# Patient Record
Sex: Male | Born: 1960 | Race: Black or African American | Hispanic: No | Marital: Married | Smoking: Current every day smoker
Health system: Southern US, Community
[De-identification: ages and names within clinical notes are randomized; demographics above are authoritative.]

## PROBLEM LIST (undated history)

## (undated) DIAGNOSIS — Z91148 Patient's other noncompliance with medication regimen for other reason: Secondary | ICD-10-CM

## (undated) DIAGNOSIS — F1910 Other psychoactive substance abuse, uncomplicated: Secondary | ICD-10-CM

## (undated) DIAGNOSIS — R32 Unspecified urinary incontinence: Secondary | ICD-10-CM

## (undated) DIAGNOSIS — Z951 Presence of aortocoronary bypass graft: Secondary | ICD-10-CM

## (undated) DIAGNOSIS — Z9114 Patient's other noncompliance with medication regimen: Secondary | ICD-10-CM

## (undated) DIAGNOSIS — K219 Gastro-esophageal reflux disease without esophagitis: Secondary | ICD-10-CM

## (undated) DIAGNOSIS — T7840XA Allergy, unspecified, initial encounter: Secondary | ICD-10-CM

## (undated) DIAGNOSIS — E785 Hyperlipidemia, unspecified: Secondary | ICD-10-CM

## (undated) DIAGNOSIS — I10 Essential (primary) hypertension: Secondary | ICD-10-CM

## (undated) DIAGNOSIS — G8929 Other chronic pain: Secondary | ICD-10-CM

## (undated) DIAGNOSIS — R55 Syncope and collapse: Secondary | ICD-10-CM

## (undated) DIAGNOSIS — B1920 Unspecified viral hepatitis C without hepatic coma: Secondary | ICD-10-CM

## (undated) DIAGNOSIS — E119 Type 2 diabetes mellitus without complications: Secondary | ICD-10-CM

## (undated) DIAGNOSIS — I77810 Thoracic aortic ectasia: Secondary | ICD-10-CM

## (undated) DIAGNOSIS — I5189 Other ill-defined heart diseases: Secondary | ICD-10-CM

## (undated) DIAGNOSIS — IMO0002 Reserved for concepts with insufficient information to code with codable children: Secondary | ICD-10-CM

## (undated) DIAGNOSIS — I2510 Atherosclerotic heart disease of native coronary artery without angina pectoris: Secondary | ICD-10-CM

## (undated) DIAGNOSIS — J449 Chronic obstructive pulmonary disease, unspecified: Secondary | ICD-10-CM

## (undated) DIAGNOSIS — M549 Dorsalgia, unspecified: Secondary | ICD-10-CM

## (undated) DIAGNOSIS — J45909 Unspecified asthma, uncomplicated: Secondary | ICD-10-CM

## (undated) HISTORY — DX: Gastro-esophageal reflux disease without esophagitis: K21.9

## (undated) HISTORY — DX: Other chronic pain: G89.29

## (undated) HISTORY — DX: Other ill-defined heart diseases: I51.89

## (undated) HISTORY — DX: Patient's other noncompliance with medication regimen for other reason: Z91.148

## (undated) HISTORY — DX: Unspecified asthma, uncomplicated: J45.909

## (undated) HISTORY — DX: Syncope and collapse: R55

## (undated) HISTORY — DX: Unspecified viral hepatitis C without hepatic coma: B19.20

## (undated) HISTORY — DX: Patient's other noncompliance with medication regimen: Z91.14

## (undated) HISTORY — DX: Dorsalgia, unspecified: M54.9

## (undated) HISTORY — DX: Reserved for concepts with insufficient information to code with codable children: IMO0002

## (undated) HISTORY — PX: OTHER SURGICAL HISTORY: SHX169

## (undated) HISTORY — DX: Unspecified urinary incontinence: R32

## (undated) HISTORY — DX: Chronic obstructive pulmonary disease, unspecified: J44.9

## (undated) HISTORY — DX: Essential (primary) hypertension: I10

## (undated) HISTORY — DX: Allergy, unspecified, initial encounter: T78.40XA

## (undated) HISTORY — DX: Thoracic aortic ectasia: I77.810

---

## 1979-04-30 DIAGNOSIS — G8929 Other chronic pain: Secondary | ICD-10-CM

## 1979-04-30 HISTORY — DX: Other chronic pain: G89.29

## 2004-07-09 ENCOUNTER — Emergency Department: Payer: Self-pay | Admitting: General Practice

## 2004-08-02 ENCOUNTER — Emergency Department: Payer: Self-pay | Admitting: Internal Medicine

## 2004-08-27 ENCOUNTER — Ambulatory Visit: Payer: Self-pay | Admitting: Unknown Physician Specialty

## 2004-09-17 ENCOUNTER — Emergency Department: Payer: Self-pay | Admitting: Emergency Medicine

## 2004-09-17 ENCOUNTER — Other Ambulatory Visit: Payer: Self-pay

## 2004-09-24 ENCOUNTER — Emergency Department: Payer: Self-pay | Admitting: Emergency Medicine

## 2004-10-25 ENCOUNTER — Ambulatory Visit: Payer: Self-pay | Admitting: Unknown Physician Specialty

## 2004-12-06 ENCOUNTER — Emergency Department: Payer: Self-pay | Admitting: Emergency Medicine

## 2005-01-08 ENCOUNTER — Emergency Department: Payer: Self-pay | Admitting: Emergency Medicine

## 2005-01-08 ENCOUNTER — Other Ambulatory Visit: Payer: Self-pay

## 2005-01-09 ENCOUNTER — Ambulatory Visit: Payer: Self-pay | Admitting: Emergency Medicine

## 2005-01-15 ENCOUNTER — Emergency Department: Payer: Self-pay | Admitting: Unknown Physician Specialty

## 2005-01-26 ENCOUNTER — Emergency Department: Payer: Self-pay | Admitting: Emergency Medicine

## 2007-10-08 ENCOUNTER — Emergency Department: Payer: Self-pay | Admitting: Emergency Medicine

## 2007-10-28 ENCOUNTER — Emergency Department: Payer: Self-pay | Admitting: Unknown Physician Specialty

## 2007-11-28 ENCOUNTER — Emergency Department: Payer: Self-pay | Admitting: Emergency Medicine

## 2007-11-28 ENCOUNTER — Other Ambulatory Visit: Payer: Self-pay

## 2008-01-14 ENCOUNTER — Emergency Department: Payer: Self-pay | Admitting: Emergency Medicine

## 2008-10-10 ENCOUNTER — Emergency Department: Payer: Self-pay | Admitting: Emergency Medicine

## 2008-11-01 ENCOUNTER — Emergency Department: Payer: Self-pay | Admitting: Emergency Medicine

## 2008-12-21 ENCOUNTER — Emergency Department: Payer: Self-pay | Admitting: Emergency Medicine

## 2012-11-27 HISTORY — PX: CARDIAC CATHETERIZATION: SHX172

## 2012-11-27 HISTORY — PX: CORONARY ARTERY BYPASS GRAFT: SHX141

## 2013-01-23 ENCOUNTER — Observation Stay: Payer: Self-pay | Admitting: Internal Medicine

## 2013-01-23 LAB — BASIC METABOLIC PANEL
Anion Gap: 8 (ref 7–16)
Calcium, Total: 9.1 mg/dL (ref 8.5–10.1)
Co2: 24 mmol/L (ref 21–32)
EGFR (Non-African Amer.): >60
Glucose: 527 mg/dL (ref 65–99)
Osmolality: 286 (ref 275–301)
Sodium: 131 mmol/L — ABNORMAL LOW (ref 136–145)

## 2013-01-23 LAB — CBC
HCT: 46.7 % (ref 40.0–52.0)
MCHC: 32.5 g/dL (ref 32.0–36.0)
RBC: 5.67 10*6/uL (ref 4.40–5.90)
RDW: 14.2 % (ref 11.5–14.5)
WBC: 7.5 10*3/uL (ref 3.8–10.6)

## 2013-01-23 LAB — TROPONIN I
Troponin-I: <0.02 ng/mL
Troponin-I: <0.02 ng/mL

## 2013-01-23 LAB — CK TOTAL AND CKMB (NOT AT ARMC)
CK, Total: 56 U/L (ref 35–232)
CK-MB: <0.5 ng/mL — ABNORMAL LOW (ref 0.5–3.6)

## 2013-01-24 LAB — LIPID PANEL
Ldl Cholesterol, Calc: 70 mg/dL (ref 0–100)
Triglycerides: 184 mg/dL (ref 0–200)
VLDL Cholesterol, Calc: 37 mg/dL (ref 5–40)

## 2013-01-24 LAB — BASIC METABOLIC PANEL
Anion Gap: 6 — ABNORMAL LOW (ref 7–16)
BUN: 11 mg/dL (ref 7–18)
Calcium, Total: 8.9 mg/dL (ref 8.5–10.1)
Chloride: 104 mmol/L (ref 98–107)
EGFR (African American): >60
EGFR (Non-African Amer.): >60
Osmolality: 274 (ref 275–301)
Potassium: 3.5 mmol/L (ref 3.5–5.1)

## 2013-02-10 ENCOUNTER — Emergency Department: Payer: Self-pay

## 2013-02-10 LAB — URINALYSIS, COMPLETE
Glucose,UR: >=500 mg/dL (ref 0–75)
Ketone: NEGATIVE
Leukocyte Esterase: NEGATIVE
Nitrite: NEGATIVE
RBC,UR: NONE SEEN /HPF (ref 0–5)
Specific Gravity: 1.031 (ref 1.003–1.030)
Squamous Epithelial: NONE SEEN
WBC UR: 1 /HPF (ref 0–5)

## 2013-02-10 LAB — CBC
HCT: 43.6 % (ref 40.0–52.0)
HGB: 14.3 g/dL (ref 13.0–18.0)
MCH: 26.5 pg (ref 26.0–34.0)
MCHC: 32.8 g/dL (ref 32.0–36.0)
MCV: 81 fL (ref 80–100)
Platelet: 109 10*3/uL — ABNORMAL LOW (ref 150–440)
RBC: 5.39 10*6/uL (ref 4.40–5.90)
RDW: 14.3 % (ref 11.5–14.5)
WBC: 7.8 10*3/uL (ref 3.8–10.6)

## 2013-02-10 LAB — COMPREHENSIVE METABOLIC PANEL
Albumin: 3.8 g/dL (ref 3.4–5.0)
Alkaline Phosphatase: 110 U/L (ref 50–136)
Bilirubin,Total: 0.5 mg/dL (ref 0.2–1.0)
Calcium, Total: 9.3 mg/dL (ref 8.5–10.1)
Creatinine: 0.74 mg/dL (ref 0.60–1.30)
EGFR (African American): >60
Glucose: 395 mg/dL — ABNORMAL HIGH (ref 65–99)
Osmolality: 280 (ref 275–301)
SGOT(AST): 38 U/L — ABNORMAL HIGH (ref 15–37)
SGPT (ALT): 61 U/L (ref 12–78)
Sodium: 132 mmol/L — ABNORMAL LOW (ref 136–145)
Total Protein: 7.5 g/dL (ref 6.4–8.2)

## 2013-02-24 ENCOUNTER — Ambulatory Visit (INDEPENDENT_AMBULATORY_CARE_PROVIDER_SITE_OTHER): Payer: Medicare Other | Admitting: Adult Health

## 2013-02-24 ENCOUNTER — Encounter: Payer: Self-pay | Admitting: Adult Health

## 2013-02-24 VITALS — BP 98/60 | HR 89 | Temp 97.9°F | Resp 12 | Ht 67.5 in | Wt 171.5 lb

## 2013-02-24 DIAGNOSIS — I25810 Atherosclerosis of coronary artery bypass graft(s) without angina pectoris: Secondary | ICD-10-CM

## 2013-02-24 DIAGNOSIS — E1165 Type 2 diabetes mellitus with hyperglycemia: Secondary | ICD-10-CM

## 2013-02-24 DIAGNOSIS — B1920 Unspecified viral hepatitis C without hepatic coma: Secondary | ICD-10-CM | POA: Insufficient documentation

## 2013-02-24 DIAGNOSIS — Z0000 Encounter for general adult medical examination without abnormal findings: Secondary | ICD-10-CM | POA: Insufficient documentation

## 2013-02-24 NOTE — Assessment & Plan Note (Addendum)
Start levemir 10 units at bedtime. Provided patient with sample. Instructions and education regarding use of the flexpen. He needs to monitor his BG 4 times a day. Provided with diabetes journal so that he could bring readings with him next week. He is aware we may need to increase dose of insulin and possibly add 3rd agent. Check bmet and hgba1c. Note, greater than 45 min were spent in face to face communication pertaining to the problems listed today and in the education, planning and implementation of care as well as in the review of outside medical records.

## 2013-02-24 NOTE — Patient Instructions (Addendum)
  Please have your labs drawn prior to leaving the office.  We will contact you once the results are available.   Start Levemir 10 units at bedtime.  Please check your blood sugar 4 times a day (before breakfast, before lunch, before dinner and before bedtime)  Bring the blood glucose readings with you on your next visit so that I may adjust your insulin dose as necessary.  Please schedule a follow up appointment to see me next Wednesday.   Appointment with Cardiology on Tuesday, November 4th at 3:30 pm. Please arrive at 3:15 pm  [address] [place], [place]  [phone]  I am also referring you to GI for your hepatitis C. They will contact you with an appointment.

## 2013-02-24 NOTE — Progress Notes (Signed)
Subjective:    Patient ID: Kyle [name], male    DOB: Jun 19, 1960, 52 y.o.   MRN: [id number]  HPI  Patient is a pleasant 52 y/o male who presents to clinic to establish care. He has a history of hypertension, hyperlipidemia, diabetes type 2,  CAD s/p CABG x 4 (11/25/12) at the [facility]. 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 This was not addressed during surgery.  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 Pt does not have a local cardiologist. He was participating in cardiac rehab at [facility] but his blood glucose levels have been uncontrolled. They will not let him participate in rehab until this is better controlled. His original appointment with me was for December. A nurse from the rehab department called to see if patient could be worked in sooner. Patient reports that his blood glucose level has been running greater than 300. He is on metformin 1000 mg twice daily with meals. He reports that his blood glucose levels were well controlled prior to surgery. Pt c/o polyuria, polydipsia.     Past Medical History  Diagnosis Date  . Asthma   . Diabetes mellitus without complication   . COPD (chronic obstructive pulmonary disease)   . Fainting spell   . GERD (gastroesophageal reflux disease)   . Allergy   . Hyperlipidemia   . Hypertension   . Urine incontinence   . CAD (coronary artery disease)   . Hepatitis C     2/2 tatoo     Past Surgical History  Procedure Laterality Date  . Coronary artery bypass graft  8/14     Family History  Problem Relation Age of Onset  . Heart disease Mother   . Heart disease Father   . Hypertension Father   . Diabetes Sister   . Cancer Sister     skin cancer     History   Social History  . Marital Status: Single    Spouse Name: N/A    Number of Children: N/A  . Years of Education: 14   Occupational History   . 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 Smoking status: Former Smoker -- 35 years    Types: Cigarettes    Quit date: 11/24/2012  . Smokeless tobacco: Never Used  . Alcohol Use: No  . Drug Use: No  . Sexual Activity: Not on file   Other Topics Concern  . Not on file   Social History Narrative   [name] grew up in the [place] area. He was in the [employer] for 4 years. 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 He is currently disabled. [name] lives by himself but has excellent support system. He is very close to his family. He is currently taking online courses through [employer]. He is trying to obtain a degree in social services and ministry. His goal is to work with at-risk young adults.      Review of Systems  Constitutional: Positive for fatigue. Negative for activity change and appetite change.  HENT: Negative.   Respiratory: Negative for cough, chest tightness and shortness of breath.   Cardiovascular: Negative.   Gastrointestinal: Negative.   Endocrine: Positive for polydipsia and polyuria.  Genitourinary: Negative.   Musculoskeletal: Positive  for back pain and neck pain.  Skin: Negative.   Allergic/Immunologic: Negative.   Neurological: Negative.   Hematological: Negative.   Psychiatric/Behavioral: Negative.   All other systems reviewed and are negative.       Objective:   Physical Exam  Constitutional: He is oriented to person, place, and time. He appears well-developed and well-nourished. No distress.  HENT:  Head: Normocephalic and atraumatic.  Eyes: Conjunctivae are normal. Pupils are equal, round, and reactive to light.  Cardiovascular: Normal rate, regular rhythm and intact distal pulses.  Exam reveals no gallop.   No murmur heard. Pulmonary/Chest: Effort normal and breath sounds normal. No respiratory distress. He has no wheezes. He has no  rales.  Abdominal: Soft. Bowel sounds are normal.  Musculoskeletal: Normal range of motion.  Neurological: He is alert and oriented to person, place, and time.  Skin: Skin is warm and dry.  Psychiatric: He has a normal mood and affect. His behavior is normal. Judgment and thought content normal.    BP 98/60  Pulse 89  Temp(Src) 97.9 F (36.6 C) (Oral)  Resp 12  Ht 5' 7.5" (1.715 m)  Wt 171 lb 8 oz (77.792 kg)  BMI 26.45 kg/m2  SpO2 97%       Assessment & Plan:

## 2013-02-25 ENCOUNTER — Telehealth: Payer: Self-pay | Admitting: *Deleted

## 2013-02-25 ENCOUNTER — Encounter: Payer: Self-pay | Admitting: Emergency Medicine

## 2013-02-25 LAB — HEPATIC FUNCTION PANEL
ALT: 66 U/L — ABNORMAL HIGH (ref 0–53)
AST: 42 U/L — ABNORMAL HIGH (ref 0–37)
Albumin: 4.1 g/dL (ref 3.5–5.2)
Alkaline Phosphatase: 92 U/L (ref 39–117)
Bilirubin, Direct: 0.2 mg/dL (ref 0.0–0.3)
Total Bilirubin: 0.5 mg/dL (ref 0.3–1.2)
Total Protein: 7.2 g/dL (ref 6.0–8.3)

## 2013-02-25 LAB — BASIC METABOLIC PANEL
BUN: 12 mg/dL (ref 6–23)
Calcium: 9.6 mg/dL (ref 8.4–10.5)
Creatinine, Ser: 0.8 mg/dL (ref 0.4–1.5)
GFR: 129.46 mL/min (ref >60.00)
Glucose, Bld: 518 mg/dL (ref 70–99)
Potassium: 4.9 mEq/L (ref 3.5–5.1)

## 2013-02-25 LAB — TSH: TSH: 1.34 u[IU]/mL (ref 0.35–5.50)

## 2013-02-25 LAB — CBC WITH DIFFERENTIAL/PLATELET
Basophils Absolute: 0.1 10*3/uL (ref 0.0–0.1)
Basophils Relative: 0.7 % (ref 0.0–3.0)
Eosinophils Absolute: 0.3 10*3/uL (ref 0.0–0.7)
Eosinophils Relative: 2.9 % (ref 0.0–5.0)
HCT: 45 % (ref 39.0–52.0)
Hemoglobin: 14.7 g/dL (ref 13.0–17.0)
Lymphocytes Relative: 44.2 % (ref 12.0–46.0)
Lymphs Abs: 4.1 10*3/uL — ABNORMAL HIGH (ref 0.7–4.0)
MCHC: 32.7 g/dL (ref 30.0–36.0)
MCV: 80.6 fl (ref 78.0–100.0)
Monocytes Absolute: 0.7 10*3/uL (ref 0.1–1.0)
Monocytes Relative: 7.6 % (ref 3.0–12.0)
Neutro Abs: 4.1 10*3/uL (ref 1.4–7.7)
Neutrophils Relative %: 44.6 % (ref 43.0–77.0)
Platelets: 109 10*3/uL — ABNORMAL LOW (ref 150.0–400.0)
RBC: 5.59 Mil/uL (ref 4.22–5.81)
RDW: 13.9 % (ref 11.5–14.6)
WBC: 9.2 10*3/uL (ref 4.5–10.5)

## 2013-02-25 LAB — HEMOGLOBIN A1C: Hgb A1c MFr Bld: 14 % — ABNORMAL HIGH (ref 4.6–6.5)

## 2013-02-25 LAB — VITAMIN D 25 HYDROXY (VIT D DEFICIENCY, FRACTURES): Vit D, 25-Hydroxy: 29 ng/mL — ABNORMAL LOW (ref 30–89)

## 2013-02-25 LAB — VITAMIN B12: Vitamin B-12: 385 pg/mL (ref 211–911)

## 2013-02-25 NOTE — Telephone Encounter (Signed)
[facility] called with critical glucose 518, Raquel notified.

## 2013-02-26 ENCOUNTER — Telehealth: Payer: Self-pay | Admitting: Adult Health

## 2013-02-26 NOTE — Telephone Encounter (Signed)
Pt notified, verbalized understanding. Will call back next with readings.

## 2013-02-26 NOTE — Telephone Encounter (Signed)
Start levemir 10 units in the morning and 10 units in the evening

## 2013-02-26 NOTE — Telephone Encounter (Signed)
Spoke with pt, has been using Levemir 10 units at bedtime. Sugars continue to remain high, 477 this am, 442 before lunch.

## 2013-02-26 NOTE — Telephone Encounter (Signed)
States he spoke with cardiac nurse and she advised him to call and tell Raquel that the insulin prescribed is not enough.

## 2013-02-26 NOTE — Telephone Encounter (Signed)
Left message for pt to return my call.

## 2013-02-27 ENCOUNTER — Encounter: Payer: Self-pay | Admitting: Adult Health

## 2013-02-27 DIAGNOSIS — I25810 Atherosclerosis of coronary artery bypass graft(s) without angina pectoris: Secondary | ICD-10-CM | POA: Insufficient documentation

## 2013-02-27 NOTE — Assessment & Plan Note (Addendum)
Patient has not been followed by GI for his dx of Hep C. Check liver panel. Will refer to GI.

## 2013-02-27 NOTE — Assessment & Plan Note (Signed)
Normal physical exam. Check labs: cbc w/diff, bmet, tsh, hepatic panel, vit d, b12, hgbA1c. Refer to cardiology - pt had surgery in [place]. No local MD Refer to GI - hx of hep c

## 2013-02-27 NOTE — Assessment & Plan Note (Signed)
Pt is s/p CABG x 4 on 11/25/12 in [place]. He is doing well; however, he has not been able to participate in cardiac rehab 2/2 hyperglycemia. Starting levemir. Will refer to cardiology to establish care with local MD.

## 2013-03-01 ENCOUNTER — Telehealth: Payer: Self-pay | Admitting: *Deleted

## 2013-03-01 NOTE — Telephone Encounter (Signed)
Left message, advising pt, asked if he could be seen tomorrow at 11:15 per Raquel to discuss. I made pt appointment and requested him to call back to confirm if he could keep this appointment.

## 2013-03-01 NOTE — Telephone Encounter (Signed)
Pt left VM, stating glucose level 491 fasting today. Increased Levemir to bid last Friday. Has appointment with Raquel on 03/03/13.

## 2013-03-01 NOTE — Telephone Encounter (Signed)
I need to start him on a sliding scale insulin in addition to what he is already on. Try to see if we can get him in tomorrow - 30 minute slot.

## 2013-03-02 ENCOUNTER — Ambulatory Visit: Payer: Medicare Other | Admitting: Adult Health

## 2013-03-02 ENCOUNTER — Ambulatory Visit (INDEPENDENT_AMBULATORY_CARE_PROVIDER_SITE_OTHER): Payer: Medicare Other | Admitting: Cardiovascular Disease

## 2013-03-02 ENCOUNTER — Encounter: Payer: Self-pay | Admitting: Cardiovascular Disease

## 2013-03-02 VITALS — BP 100/78 | HR 72 | Ht 67.0 in | Wt 175.2 lb

## 2013-03-02 DIAGNOSIS — Z87891 Personal history of nicotine dependence: Secondary | ICD-10-CM | POA: Insufficient documentation

## 2013-03-02 DIAGNOSIS — B1920 Unspecified viral hepatitis C without hepatic coma: Secondary | ICD-10-CM

## 2013-03-02 DIAGNOSIS — E785 Hyperlipidemia, unspecified: Secondary | ICD-10-CM

## 2013-03-02 DIAGNOSIS — E1165 Type 2 diabetes mellitus with hyperglycemia: Secondary | ICD-10-CM

## 2013-03-02 DIAGNOSIS — R0602 Shortness of breath: Secondary | ICD-10-CM

## 2013-03-02 DIAGNOSIS — R079 Chest pain, unspecified: Secondary | ICD-10-CM

## 2013-03-02 DIAGNOSIS — I25810 Atherosclerosis of coronary artery bypass graft(s) without angina pectoris: Secondary | ICD-10-CM

## 2013-03-02 NOTE — Assessment & Plan Note (Signed)
Mildly elevated LFTs. We'll need to monitor this periodically while he is on his statin.

## 2013-03-02 NOTE — Assessment & Plan Note (Signed)
Long history of smoking, stopped 4 months ago around the time of his surgery. Recommended he continue smoking cessation

## 2013-03-02 NOTE — Progress Notes (Signed)
Patient ID: Kyle [name], male    DOB: 04/17/1961, 52 y.o.   MRN: [id number]  HPI Comments: Kyle [name] is a 52 year old gentleman with long history of poorly controlled diabetes, long history of smoking who stopped 4 months ago, hyperlipidemia, presented by referral for evaluation of recent bypass surgery.   He reports that he had worsening shortness of breath, fatigue, slight chest pain prior to his recent bypass surgery. He had four-vessel bypass in July 2014 at the [facility] in [place]. Hospital is called [facility]. Records have been requested.   Reports that since then, he has been having more difficulty with his sugars. He is currently taking Levemir 10 units twice a day. Reports that sugars continue to run off the scale. He is trying to watch what he eats. He has followup with primary care tomorrow. He is running out of his needles. Also reports running out of his pain medication which is provided by the [facility]. He is to call them once a month to get his medication through the mail.  In followup today, he denies any significant chest pain or shortness of breath. He was that he has recovered relatively well from the surgery. Still some fatigue.  EKG shows normal sinus rhythm with rate 75 beats per minute, right bundle branch block, left anterior fascicular block   Outpatient Encounter Prescriptions as of 03/02/2013  Medication Sig  . albuterol (PROVENTIL HFA;VENTOLIN HFA) 108 (90 BASE) MCG/ACT inhaler Inhale 2 puffs into the lungs 4 (four) times daily.  Marland Kitchen ammonium lactate (AMLACTIN) 12 % cream Apply topically 2 (two) times daily.  Marland Kitchen aspirin 325 MG tablet Take 325 mg by mouth daily.  . cetirizine (ZYRTEC) 10 MG tablet Take 10 mg by mouth daily as needed for allergies.  Marland Kitchen docusate sodium (COLACE) 100 MG capsule Take 100 mg by mouth 2 (two) times daily as needed for constipation.  . gabapentin (NEURONTIN) 800 MG tablet Take 1,200 mg by mouth 3 (three) times daily.  Marland Kitchen  glucose blood test strip 1 each by Other route as needed for other. Precision xtra test strip  . hydrOXYzine (ATARAX/VISTARIL) 25 MG tablet Take 25 mg by mouth every 8 (eight) hours as needed for itching.  Marland Kitchen LANCETS ULTRA FINE MISC by Does not apply route 4 (four) times daily.  . metFORMIN (GLUCOPHAGE) 1000 MG tablet Take 1,000 mg by mouth 2 (two) times daily with a meal.  . metoprolol tartrate (LOPRESSOR) 25 MG tablet Take 37.5 mg by mouth 3 (three) times daily.  Marland Kitchen omeprazole (PRILOSEC) 20 MG capsule Take 20 mg by mouth daily.  . sildenafil (VIAGRA) 100 MG tablet Take 100 mg by mouth daily as needed for erectile dysfunction.  . simvastatin (ZOCOR) 80 MG tablet Take 80 mg by mouth at bedtime.  . Zinc Oxide 10 % OINT Apply topically 4 (four) times daily as needed. A & D ointment     Review of Systems  Constitutional: Negative.   HENT: Negative.   Eyes: Negative.   Respiratory: Negative.   Cardiovascular: Negative.   Gastrointestinal: Negative.   Endocrine: Negative.   Musculoskeletal: Positive for back pain and neck pain.  Skin: Negative.   Allergic/Immunologic: Negative.   Neurological: Negative.   Hematological: Negative.   Psychiatric/Behavioral: Negative.   All other systems reviewed and are negative.    BP 100/78  Pulse 72  Ht 5\' 7"  (1.702 m)  Wt 175 lb 4 oz (79.493 kg)  BMI 27.44 kg/m2  Physical Exam  Nursing note and vitals reviewed. Constitutional: He is oriented to person, place, and time. He appears well-developed and well-nourished.  HENT:  Head: Normocephalic.  Nose: Nose normal.  Mouth/Throat: Oropharynx is clear and moist.  Eyes: Conjunctivae are normal. Pupils are equal, round, and reactive to light.  Neck: Normal range of motion. Neck supple. No JVD present.  Cardiovascular: Normal rate, regular rhythm, S1 normal, S2 normal, normal heart sounds and intact distal pulses.  Exam reveals no gallop and no friction rub.   No murmur heard. Pulmonary/Chest:  Effort normal and breath sounds normal. No respiratory distress. He has no wheezes. He has no rales. He exhibits no tenderness.  Abdominal: Soft. Bowel sounds are normal. He exhibits no distension. There is no tenderness.  Musculoskeletal: Normal range of motion. He exhibits no edema and no tenderness.  Lymphadenopathy:    He has no cervical adenopathy.  Neurological: He is alert and oriented to person, place, and time. Coordination normal.  Skin: Skin is warm and dry. No rash noted. No erythema.  Psychiatric: He has a normal mood and affect. His behavior is normal. Judgment and thought content normal.      Assessment and Plan

## 2013-03-02 NOTE — Assessment & Plan Note (Signed)
Recent hemoglobin A1c of 14. Stressed importance of strict diet compliance. He is taking insulin 10 units twice a day. Suggested he increase it to 20 units tonight and in the morning. He has followup with primary care tomorrow.

## 2013-03-02 NOTE — Assessment & Plan Note (Signed)
Appears to be doing well following his bypass surgery in July 2014. He has stopped smoking, able to tolerate his cholesterol medication. Stressed the importance of good diabetes control.

## 2013-03-02 NOTE — Patient Instructions (Signed)
You are doing well. No medication changes were made.  Please call us if you have new issues that need to be addressed before your next appt.  Your physician wants you to follow-up in: 6 months.  You will receive a reminder letter in the mail two months in advance. If you don't receive a letter, please call our office to schedule the follow-up appointment.   

## 2013-03-02 NOTE — Assessment & Plan Note (Signed)
Recommended he stay on his simvastatin. Goal LDL less than 70. Numbers may improve as his diabetes improves

## 2013-03-03 ENCOUNTER — Encounter: Payer: Self-pay | Admitting: Adult Health

## 2013-03-03 ENCOUNTER — Ambulatory Visit (INDEPENDENT_AMBULATORY_CARE_PROVIDER_SITE_OTHER): Payer: Medicare Other | Admitting: Adult Health

## 2013-03-03 VITALS — BP 116/74 | HR 92 | Temp 98.3°F | Resp 16 | Ht 67.0 in | Wt 169.8 lb

## 2013-03-03 DIAGNOSIS — E1165 Type 2 diabetes mellitus with hyperglycemia: Secondary | ICD-10-CM

## 2013-03-03 MED ORDER — INSULIN NPH ISOPHANE & REGULAR (70-30) 100 UNIT/ML ~~LOC~~ SUSP: SUBCUTANEOUS | Status: DC

## 2013-03-03 MED ORDER — INSULIN SYRINGES (DISPOSABLE) U-100 1 ML MISC: Status: DC

## 2013-03-03 NOTE — Progress Notes (Signed)
Pre-visit discussion using our clinic review tool. No additional management support is needed unless otherwise documented below in the visit note.  

## 2013-03-03 NOTE — Progress Notes (Signed)
Subjective:    Patient ID: Kyle [name], male    DOB: 02/01/1961, 52 y.o.   MRN: [id number]  HPI  Patient is a pleasant 52 year old male who presents to clinic for one week followup for diabetes type 2 uncontrolled. Patient was insulin nave so he was initially started on Levemir 10 units at bedtime. His sugars were still not well controlled so Levemir 10 units in the morning was also added. Patient was asked to monitor his blood sugar levels 4 times a day-before meals and at bedtime for one week so that we could make adjustments to his medication. Patient called several times during the week stating that his blood sugar was still running high. He presents today with his glucometer and readings since last week. He has not been checking these 4 times a day. He has mostly been checking his blood sugar twice a day. Blood sugar have been running from 392 to high 400s. Still reports increased urination and thirst.  Current Outpatient Prescriptions on File Prior to Visit  Medication Sig Dispense Refill  . albuterol (PROVENTIL HFA;VENTOLIN HFA) 108 (90 BASE) MCG/ACT inhaler Inhale 2 puffs into the lungs 4 (four) times daily.      Marland Kitchen ammonium lactate (AMLACTIN) 12 % cream Apply topically 2 (two) times daily.      Marland Kitchen aspirin 325 MG tablet Take 325 mg by mouth daily.      . cetirizine (ZYRTEC) 10 MG tablet Take 10 mg by mouth daily as needed for allergies.      Marland Kitchen docusate sodium (COLACE) 100 MG capsule Take 100 mg by mouth 2 (two) times daily as needed for constipation.      . gabapentin (NEURONTIN) 800 MG tablet Take 1,200 mg by mouth 3 (three) times daily.      Marland Kitchen glucose blood test strip 1 each by Other route as needed for other. Precision xtra test strip      . hydrOXYzine (ATARAX/VISTARIL) 25 MG tablet Take 25 mg by mouth every 8 (eight) hours as needed for itching.      Marland Kitchen LANCETS ULTRA FINE MISC by Does not apply route 4 (four) times daily.      . metFORMIN (GLUCOPHAGE) 1000 MG tablet Take 1,000 mg by  mouth 2 (two) times daily with a meal.      . metoprolol tartrate (LOPRESSOR) 25 MG tablet Take 37.5 mg by mouth 3 (three) times daily.      Marland Kitchen omeprazole (PRILOSEC) 20 MG capsule Take 20 mg by mouth daily.      . sildenafil (VIAGRA) 100 MG tablet Take 100 mg by mouth daily as needed for erectile dysfunction.      . simvastatin (ZOCOR) 80 MG tablet Take 80 mg by mouth at bedtime.      . Zinc Oxide 10 % OINT Apply topically 4 (four) times daily as needed. A & D ointment       No current facility-administered medications on file prior to visit.    Review of Systems  Constitutional: Negative.   Respiratory: Positive for shortness of breath.        Shortness of breath with exertion  Cardiovascular: Negative for chest pain, palpitations and leg swelling.  Endocrine: Positive for polydipsia and polyuria.  Neurological: Negative for tremors and weakness.       Objective:   Physical Exam  Constitutional: He is oriented to person, place, and time. He appears well-developed and well-nourished. No distress.  Cardiovascular: Normal rate and regular rhythm.   Pulmonary/Chest: Effort  normal. No respiratory distress.  Neurological: He is alert and oriented to person, place, and time.  Psychiatric: He has a normal mood and affect. His behavior is normal. Judgment and thought content normal.    BP 116/74  Pulse 92  Temp(Src) 98.3 F (36.8 C) (Oral)  Resp 16  Ht 5\' 7"  (1.702 m)  Wt 169 lb 12 oz (76.998 kg)  BMI 26.58 kg/m2  SpO2 97%       Assessment & Plan:

## 2013-03-03 NOTE — Assessment & Plan Note (Addendum)
Discontinue Levemir (patient had been provided with samples). Start insulin 70/30 - 25 units before breakfast and 15 units before dinner. Goal is to have AM fasting blood sugar less than 150. Adjust p.m. dose every 3 days (3 units) as needed. Patient will check blood sugars 4 times a day x2 weeks. He will send his blood sugar readings through my chart. Note, 30 minutes spent in face-to-face communication with patient in reviewing patient's information, education pertaining to changes in medication, assessment, planning and implementation of care pertaining to his uncontrolled diabetes.

## 2013-03-03 NOTE — Patient Instructions (Signed)
  Please check your blood glucose before each meal and at bedtime for the next 2 weeks.  Records your readings and send them to me through your MyChart Account so that we can make adjustments to your medication.  Hold the Levemir - Do not take.  Start Insulin 70/30. Inject 25 units before breakfast and then 15 units before your evening meal.  I have sent in a prescription for the syringes.

## 2013-03-04 ENCOUNTER — Other Ambulatory Visit: Payer: Self-pay

## 2013-03-05 ENCOUNTER — Telehealth: Payer: Self-pay | Admitting: *Deleted

## 2013-03-05 NOTE — Telephone Encounter (Signed)
Pt called to report glucose readings. Thursday am 500. Before dinner 360. Bedtime 276. AM today 352. Pt stopped levemir and began 70/30 as directed, confirmed units with pt. Also requests BW to be sent to [facility] fax [phone]. Fax sent.

## 2013-03-08 ENCOUNTER — Other Ambulatory Visit: Payer: Self-pay | Admitting: Adult Health

## 2013-03-08 DIAGNOSIS — E1165 Type 2 diabetes mellitus with hyperglycemia: Secondary | ICD-10-CM

## 2013-03-08 NOTE — Telephone Encounter (Signed)
Pt called with readings, Saturday: 122, 216, 316, and 270. Sunday 312. Pt agreeable to referral.

## 2013-03-08 NOTE — Telephone Encounter (Signed)
Uncertain why he is not responding to any insulin. I am referring him to endocrine.

## 2013-03-10 ENCOUNTER — Ambulatory Visit (INDEPENDENT_AMBULATORY_CARE_PROVIDER_SITE_OTHER): Payer: Medicare Other | Admitting: Internal Medicine

## 2013-03-10 ENCOUNTER — Encounter: Payer: Self-pay | Admitting: Internal Medicine

## 2013-03-10 VITALS — BP 124/88 | HR 108 | Temp 97.8°F | Resp 10 | Ht 68.0 in | Wt 175.8 lb

## 2013-03-10 DIAGNOSIS — Z23 Encounter for immunization: Secondary | ICD-10-CM

## 2013-03-10 DIAGNOSIS — E1165 Type 2 diabetes mellitus with hyperglycemia: Secondary | ICD-10-CM

## 2013-03-10 NOTE — Progress Notes (Signed)
Patient ID: Kyle [name], male   DOB: 18-Aug-1960, 52 y.o.   MRN: [id number]  HPI: Kyle [name] is a 52 y.o.-year-old male, referred by his PCP, Rey,Raquel, NP, for management of DM2, insulin-dependent, uncontrolled, with complications (CAD, s/p CABG; ED; PN).  Patient has been diagnosed with diabetes in 2012; he started insulin in: 2 weeks ago.  Last hemoglobin A1c was: Lab Results  Component Value Date   HGBA1C 14.0* 02/24/2013   Pt is on a regimen of: - Metformin 1000 mg po bid - NPH/Regular 70/30 25 in am, 30 min before b'fast and 15 in pm, 30 min before dinner - started last week He was on levemir 10, the increased to 10 bid, then switched to 70/30 for hope of better control.  Pt checks his sugars 4x a day and they are: - am: 300s (was higher before starting 70/30) - 2h after b'fast: n/a - before lunch: 127 - 2h after lunch: n/a - before dinner: 120s-130s - 2h after dinner: n/a - bedtime: 120s-130s No lows. Lowest sugar was 117, 7 mo ago; ? he has hypoglycemia awareness.  Highest sugar was 500s, before the insulin  Pt's meals are: - Breakfast: cereal, eggs, pop tarts - Lunch: cold cuts  Sandwich  - Dinner: chicken, liver pudding, sandwiches +/- vegetables: green beans, corn - Snacks: 4, PB+crackers, no fruit   - no CKD, last BUN/creatinine:  Lab Results  Component Value Date   BUN 12 02/24/2013   CREATININE 0.8 02/24/2013  He is not on ACEI. He is on ASA 325.  He is on Zocor - no Lipids to review.  - last eye exam was 09/2012. No DR. No more blurry vision, but had this before insulin - + numbness and tingling in his feet.  I reviewed his chart and he also has a history of HL, Hepatitis C.  Pt has FH of DM in aunts and uncles.  ROS: Constitutional: + weight gain, + fatigue, + both subjective hyperthermia/hypothermia, + poor sleep, + excessive urination, + nocturia Eyes: + blurry vision, no xerophthalmia ENT: no sore throat, no nodules palpated in throat, no  dysphagia/odynophagia, + hoarseness Cardiovascular: no CP/+ SOB/no palpitations/+ leg swelling Respiratory: + all: cough/SOB/Wheezing Gastrointestinal: + heartburn/+ N/no V/no D/+ C Musculoskeletal: + all:" muscle/joint aches Skin: + rash - feet; + itching Neurological: no tremors/numbness/tingling/dizziness, + HA Psychiatric: no depression/+ anxiety Low libido, + diff with erections  Past Medical History  Diagnosis Date  . Asthma   . Diabetes mellitus without complication   . COPD (chronic obstructive pulmonary disease)   . Fainting spell   . GERD (gastroesophageal reflux disease)   . Allergy   . Hyperlipidemia   . Hypertension   . Urine incontinence   . CAD (coronary artery disease)   . 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 Cardiac catheterization  11/2012    [facility]   History   Social History  . Marital Status: Single    Spouse Name: N/A    Number of Children: N/A  . Years of Education: 14   Occupational History  . 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 Smoking status: Former Smoker -- 35 years    Types: Cigarettes    Quit date: 11/24/2012  . Smokeless tobacco: Never Used  .  Alcohol Use: No  . Drug Use: No  . Sexual Activity: Not on file   Other Topics Concern  . Not on file   Social History Narrative   Kyle [name] grew up in the [place] area. He was in the [employer] for 4 years. 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 He is currently disabled. Kyle [name] lives by himself but has excellent support system. He is very close to his family. He is currently taking online courses through [employer]. He is trying to obtain a degree in social services and ministry. His goal is to work with at-risk young adults.      Regular exercise: not recently    Caffeine use: occasionally   Current Outpatient Prescriptions on File Prior to Visit  Medication Sig Dispense Refill  . albuterol (PROVENTIL HFA;VENTOLIN HFA) 108 (90 BASE) MCG/ACT inhaler Inhale 2 puffs into the lungs 4 (four) times daily.      Marland Kitchen ammonium lactate (AMLACTIN) 12 % cream Apply topically 2 (two) times daily.      Marland Kitchen aspirin 325 MG tablet Take 325 mg by mouth daily.      . cetirizine (ZYRTEC) 10 MG tablet Take 10 mg by mouth daily as needed for allergies.      Marland Kitchen docusate sodium (COLACE) 100 MG capsule Take 100 mg by mouth 2 (two) times daily as needed for constipation.      . gabapentin (NEURONTIN) 800 MG tablet Take 1,200 mg by mouth 3 (three) times daily.      Marland Kitchen glucose blood test strip 1 each by Other route as needed for other. Precision xtra test strip      . hydrOXYzine (ATARAX/VISTARIL) 25 MG tablet Take 25 mg by mouth every 8 (eight) hours as needed for itching.      . insulin NPH-regular (NOVOLIN 70/30) (70-30) 100 UNIT/ML injection 25 units in the AM and 15 units with evening meal  10 mL  12  . Insulin Syringes, Disposable, U-100 1 ML MISC To inject 70/30 bid  100 each  6  . LANCETS ULTRA FINE MISC by Does not apply route 4 (four) times daily.      . metFORMIN (GLUCOPHAGE) 1000 MG tablet Take 1,000 mg by mouth 2 (two) times daily with a meal.      . metoprolol tartrate (LOPRESSOR) 25 MG tablet Take 37.5 mg by mouth 3 (three) times daily.      Marland Kitchen omeprazole (PRILOSEC) 20 MG capsule Take 20 mg by mouth daily.      . sildenafil (VIAGRA) 100 MG tablet Take 100 mg by mouth daily as needed for erectile dysfunction.      . simvastatin (ZOCOR) 80 MG tablet Take 80 mg by mouth at bedtime.      . Zinc Oxide 10 % OINT Apply topically 4 (four) times daily as needed. A & D ointment       No current facility-administered medications on file prior to visit.   Allergies  Allergen Reactions  . Amoxicillin   . Methadone   . Tramadol   . Trazodone And Nefazodone    Family History   Problem Relation Age of Onset  . Heart disease Mother   . Heart disease Father   . Hypertension Father   . Diabetes Sister   . Cancer Sister     skin cancer   PE: BP 124/88  Pulse 108  Temp(Src) 97.8 F (36.6 C) (Oral)  Resp 10  Ht 5\' 8"  (1.727 m)  Wt 175 lb 12.8 oz (79.742 kg)  BMI 26.74 kg/m2  SpO2 96% Wt Readings from Last 3 Encounters:  03/10/13 175 lb 12.8 oz (79.742 kg)  03/03/13 169 lb 12 oz (76.998 kg)  03/02/13 175 lb 4 oz (79.493 kg)   Constitutional: overweight, in NAD Eyes: PERRLA, EOMI, no exophthalmos ENT: moist mucous membranes, no thyromegaly, no cervical lymphadenopathy Cardiovascular: RRR, No MRG Respiratory: CTA B Gastrointestinal: abdomen soft, NT, ND, BS+ Musculoskeletal: no deformities, strength intact in all 4 Skin: moist, warm, no rashes Neurological: no tremor with outstretched hands, DTR normal in all 4  ASSESSMENT: 1. DM2, insulin-dependent, uncontrolled, with complications - CAD, s/p CABG 11/2012 - ED - PN  PLAN:  1. Patient with long-standing, recently more uncontrolled diabetes, on premixed insulin regimen, which is working well, except he still has high sugars in am.  - We discussed about options for treatment, and I suggested that, since he just started this insulin regimen and sugars are much better, to:  Patient Instructions  Please return in 1 month with your sugar log.  Continue current regimen for now. - discussed proper injection techniques:  Preference for the abdominal sq tissue  Rotation of sites  Change needle for each injection  Keep needle in for 10 sec after last unit of insulin in - continue checking sugars at different times of the day - check 2-4 times a day, rotating checks - given sugar log and advised how to fill it and to bring it at next appt  - given foot care handout and explained the principles  - given instructions for hypoglycemia management "15-15 rule"  - advised for yearly eye exams - will give  flu vaccine today - Return to clinic in 1 mo with sugar log

## 2013-03-10 NOTE — Patient Instructions (Addendum)
Please return in 1 month with your sugar log.  Continue current regimen for now.  PATIENT INSTRUCTIONS FOR TYPE 2 DIABETES:  DIET AND EXERCISE Diet and exercise is an important part of diabetic treatment.  We recommended aerobic exercise in the form of brisk walking (working between 40-60% of maximal aerobic capacity, similar to brisk walking) for 150 minutes per week (such as 30 minutes five days per week) along with 3 times per week performing 'resistance' training (using various gauge rubber tubes with handles) 5-10 exercises involving the major muscle groups (upper body, lower body and core) performing 10-15 repetitions (or near fatigue) each exercise. Start at half the above goal but build slowly to reach the above goals. If limited by weight, joint pain, or disability, we recommend daily walking in a swimming pool with water up to waist to reduce pressure from joints while allow for adequate exercise.    BLOOD GLUCOSES Monitoring your blood glucoses is important for continued management of your diabetes. Please check your blood glucoses 2-4 times a day: fasting, before meals and at bedtime (you can rotate these measurements - e.g. one day check before the 3 meals, the next day check before 2 of the meals and before bedtime, etc.   HYPOGLYCEMIA (low blood sugar) Hypoglycemia is usually a reaction to not eating, exercising, or taking too much insulin/ other diabetes drugs.  Symptoms include tremors, sweating, hunger, confusion, headache, etc. Treat IMMEDIATELY with 15 grams of Carbs:   4 glucose tablets    cup regular juice/soda   2 tablespoons raisins   4 teaspoons sugar   1 tablespoon honey Recheck blood glucose in 15 mins and repeat above if still symptomatic/blood glucose <100. Please contact our office at [phone] if you have questions about how to next handle your insulin.  RECOMMENDATIONS TO REDUCE YOUR RISK OF DIABETIC COMPLICATIONS: * Take your prescribed MEDICATION(S). *  Follow a DIABETIC diet: Complex carbs, fiber rich foods, heart healthy fish twice weekly, (monounsaturated and polyunsaturated) fats * AVOID saturated/trans fats, high fat foods, >2,300 mg salt per day. * EXERCISE at least 5 times a week for 30 minutes or preferably daily.  * DO NOT SMOKE OR DRINK more than 1 drink a day. * Check your FEET every day. Do not wear tightfitting shoes. Contact us if you develop an ulcer * See your EYE doctor once a year or more if needed * Get a FLU shot once a year * Get a PNEUMONIA vaccine once before and once after age 64 years  GOALS:  * Your Hemoglobin A1c of <7%  * fasting sugars need to be <130 * after meals sugars need to be <180 (2h after you start eating) * Your Systolic BP should be 140 or lower  * Your Diastolic BP should be 80 or lower  * Your HDL (Good Cholesterol) should be 40 or higher  * Your LDL (Bad Cholesterol) should be 100 or lower  * Your Triglycerides should be 150 or lower  * Your Urine microalbumin (kidney function) should be <30 * Your Body Mass Index should be 25 or lower   We will be glad to help you achieve these goals. Our telephone number is: [phone].

## 2013-03-18 ENCOUNTER — Telehealth: Payer: Self-pay | Admitting: Adult Health

## 2013-03-18 NOTE — Telephone Encounter (Signed)
Pt states he did see endocrine last week, per Raquel (verbal), pt was advised to call the endocrine office, pt states an understanding.

## 2013-03-18 NOTE — Telephone Encounter (Signed)
Blood sugars running 311-419 started last Thursday  Nov. 13, wanting to know if he should increase his medication

## 2013-03-18 NOTE — Telephone Encounter (Signed)
Has he seen endocrine yet? I made referral for uncontrolled DM.

## 2013-03-24 ENCOUNTER — Telehealth: Payer: Self-pay

## 2013-03-24 NOTE — Telephone Encounter (Signed)
Pt said FBS still running high; average 312 - 416. Pt is taking Novolin 70/30 taking 25 units in AM and 15 U at evening meal;pt also taking metformin and glipizide as prescribed.[facility]. Pt said he feels OK; occasional dizziness.Please advise. Pt request cb.

## 2013-03-24 NOTE — Telephone Encounter (Signed)
Please call pt and advise per Dr Elvera Lennox:   It really depends what time of the day the sugars are high:  - if only in am (at last visit he only had high sugars in am and they were in the 120-130 later in the day), then increase evening 70/30 to 20 (might need even 25, depending on sugars later at night or in am).  - If sugars high throughout the day, increase both 70/30 doses by 5 units and even increase both by 10 units if sugars persist high.

## 2013-03-24 NOTE — Telephone Encounter (Signed)
Patient notified as instructed by telephone. Pt voiced understanding.  

## 2013-03-24 NOTE — Telephone Encounter (Signed)
It really depends what time of the day the sugars are high:  - if only in am (at last visit he only had high sugars in am and they were in the 120-130 later in the day), then increase evening 70/30 to 20 (might need even 25, depending on sugars later at night or in am).  - If sugars high throughout the day, increase both 70/30 doses by 5 units and even increase both by 10 units if sugars persist high

## 2013-03-24 NOTE — Telephone Encounter (Signed)
Please read note below and advise.  

## 2013-04-02 LAB — HM COLONOSCOPY: HM COLON: NORMAL

## 2013-04-06 ENCOUNTER — Telehealth: Payer: Self-pay | Admitting: Adult Health

## 2013-04-06 NOTE — Telephone Encounter (Signed)
He needs to be seen and evaluated here prior to writing for narcotic medications. He could be seen next week for this.

## 2013-04-06 NOTE — Telephone Encounter (Signed)
Can you clarify with pt?? Does he want refill on Hydroxyzine 10mg ? I don't see that Hydrocodone is listed as a medication on his list. If he wants hydroxyzine, fine to fill. With 3 months refill

## 2013-04-06 NOTE — Telephone Encounter (Signed)
Spoke with pt, he is needing a refill on his Hydrocodone 10 mg, uses it for back and neck pain. Previously prescribed by [facility] doctor he no longer sees. States directions are 1 every 6-8 hours prn. What should I tell him?

## 2013-04-06 NOTE — Telephone Encounter (Signed)
Left message for pt to return my call to clarify refill request. Also asked pt to contact his pharmacy and request refill be sent to our office.

## 2013-04-06 NOTE — Telephone Encounter (Signed)
Patient wanting refill on Hydrocodone 10 mg  Wanting a called when it is filled.

## 2013-04-06 NOTE — Telephone Encounter (Signed)
Pt notified. Advised pt to check with previous physician if he can refill. Otherwise, advised to schedule an appointment to be evaluated and medication refilled. Pt verbalized understanding.

## 2013-04-08 ENCOUNTER — Ambulatory Visit: Payer: Self-pay | Admitting: Adult Health

## 2013-04-12 ENCOUNTER — Other Ambulatory Visit: Payer: Self-pay | Admitting: *Deleted

## 2013-04-12 MED ORDER — CETIRIZINE HCL 10 MG PO TABS: 10.0000 mg | ORAL_TABLET | Freq: Every day | ORAL | Status: DC | PRN

## 2013-04-16 ENCOUNTER — Telehealth: Payer: Self-pay | Admitting: Adult Health

## 2013-04-16 MED ORDER — OMEPRAZOLE 20 MG PO CPDR: 20.0000 mg | DELAYED_RELEASE_CAPSULE | Freq: Every day | ORAL | Status: DC

## 2013-04-16 NOTE — Telephone Encounter (Signed)
omeprazole (PRILOSEC) 20 MG capsule

## 2013-04-23 ENCOUNTER — Telehealth: Payer: Self-pay | Admitting: Adult Health

## 2013-04-23 NOTE — Telephone Encounter (Signed)
Spoke with pt. Informed of GI appt at [facility], resent letter with information as well. Advised to contact Dr. Windell Hummingbird office as pt said it was a cardia related rehab program he had been referred to at the hospital.

## 2013-04-23 NOTE — Telephone Encounter (Signed)
Caller: Kyle [name]/Patient; Phone: [phone]; Reason for Call: Patient reports he would like to get started back with physical therapy that he has done previously approximately 1 month ago.  Patient also reports he is being sent to [facility] for referral and does not remember when that appointment is.  Please contact patient and answer questions/schedule physical therapy.

## 2013-05-04 ENCOUNTER — Telehealth: Payer: Self-pay | Admitting: Adult Health

## 2013-05-04 MED ORDER — INSULIN NPH ISOPHANE & REGULAR (70-30) 100 UNIT/ML ~~LOC~~ SUSP: SUBCUTANEOUS | Status: DC

## 2013-05-04 MED ORDER — INSULIN SYRINGES (DISPOSABLE) U-100 1 ML MISC: Status: DC

## 2013-05-04 MED ORDER — LANCETS ULTRA FINE MISC: Status: DC

## 2013-05-04 NOTE — Telephone Encounter (Signed)
Rxs faced to number below

## 2013-05-04 NOTE — Telephone Encounter (Signed)
insulin NPH-regular (NOVOLIN 70/30) (70-30) 100 UNIT/ML   Insulin Syringes, Disposable, U-100 1 ML MISC  LANCETS ULTRA FINE MISC   Fax [phone] attention pam@ [facility] for Refill

## 2013-05-07 ENCOUNTER — Telehealth: Payer: Self-pay | Admitting: Adult Health

## 2013-05-07 NOTE — Telephone Encounter (Signed)
error 

## 2013-05-10 ENCOUNTER — Telehealth: Payer: Self-pay | Admitting: Adult Health

## 2013-05-10 ENCOUNTER — Other Ambulatory Visit: Payer: Self-pay | Admitting: Adult Health

## 2013-05-10 DIAGNOSIS — R0602 Shortness of breath: Secondary | ICD-10-CM

## 2013-05-10 NOTE — Telephone Encounter (Signed)
Needs order for bedside commode

## 2013-05-10 NOTE — Telephone Encounter (Signed)
[facility] Nurse Amy Ingebretson called to inform the physician that occupational therapy was no longer needed ,but the patient does need a bed side commode. You may call [facility] Fax # [phone] for the commode.

## 2013-05-11 NOTE — Telephone Encounter (Signed)
Rx faxed to [facility].  

## 2013-05-13 ENCOUNTER — Telehealth: Payer: Self-pay | Admitting: Adult Health

## 2013-05-13 NOTE — Telephone Encounter (Signed)
The patient called stating he received a bill for labs from [facility] and he has never had to pay for lab work. He has medicaid and medicare . The insurance did not pay for his Vitamin D.

## 2013-05-17 ENCOUNTER — Telehealth: Payer: Self-pay | Admitting: Adult Health

## 2013-05-17 NOTE — Telephone Encounter (Signed)
Thank you :)

## 2013-05-17 NOTE — Telephone Encounter (Signed)
Patient came  In needing to get a referral to continue  physical therapy  For cardiology rehab @ [facility]  Form for reimbursement of travel expense in box Forms in box

## 2013-05-17 NOTE — Telephone Encounter (Signed)
Pt came by the office and dropped off forms from the [facility] to be filled out. I will have them at my desk for Wednesday.

## 2013-05-17 NOTE — Telephone Encounter (Signed)
Pt called he needs a note for work stating the dates that he was seen in the office from oct forward

## 2013-05-17 NOTE — Telephone Encounter (Signed)
Please advise 

## 2013-05-18 NOTE — Telephone Encounter (Signed)
Spoke with pt, will bring paperwork back to office, it has been misplaced.

## 2013-05-18 NOTE — Telephone Encounter (Signed)
Left message for pt to return my call.

## 2013-05-25 ENCOUNTER — Other Ambulatory Visit: Payer: Self-pay | Admitting: Adult Health

## 2013-05-25 ENCOUNTER — Telehealth: Payer: Self-pay | Admitting: Adult Health

## 2013-05-25 DIAGNOSIS — M549 Dorsalgia, unspecified: Secondary | ICD-10-CM

## 2013-05-25 NOTE — Telephone Encounter (Signed)
I will refer him to pain clinic. I will not be prescribing narcotics to pt.

## 2013-05-25 NOTE — Telephone Encounter (Signed)
Ok refill? He was previously getting this from another physician

## 2013-05-25 NOTE — Telephone Encounter (Signed)
The patient is needing his Hydrocodone 10 mg refilled take every 6-8 hrs or as needed for back pain. He also has a deteriorating disc in his neck.

## 2013-05-26 ENCOUNTER — Telehealth: Payer: Self-pay

## 2013-05-26 NOTE — Telephone Encounter (Signed)
Pt states he would like to start his cardiac rehab back. Please call.

## 2013-05-26 NOTE — Telephone Encounter (Signed)
Spoke w/ pt.  He states that his PT was temporarily discontinued due to ins reasons and he would like to resume it. Advised pt that we did not order PT and he should contact PCPs office to resume this referral. Offered pt Cardiac Rehab due to his bypass in July.  He states that he is very interested in this and would like a referral to [facility].

## 2013-05-26 NOTE — Telephone Encounter (Signed)
Left message, notifying pt of referral

## 2013-06-01 ENCOUNTER — Telehealth: Payer: Self-pay | Admitting: Adult Health

## 2013-06-01 ENCOUNTER — Emergency Department: Payer: Self-pay | Admitting: Emergency Medicine

## 2013-06-01 LAB — CBC
HCT: 44.6 % (ref 40.0–52.0)
HGB: 14.7 g/dL (ref 13.0–18.0)
MCH: 27.9 pg (ref 26.0–34.0)
MCHC: 32.9 g/dL (ref 32.0–36.0)
MCV: 85 fL (ref 80–100)
Platelet: 126 10*3/uL — ABNORMAL LOW (ref 150–440)
RBC: 5.25 10*6/uL (ref 4.40–5.90)
RDW: 14.4 % (ref 11.5–14.5)
WBC: 10.1 10*3/uL (ref 3.8–10.6)

## 2013-06-01 LAB — PRO B NATRIURETIC PEPTIDE: B-Type Natriuretic Peptide: 130 pg/mL — ABNORMAL HIGH (ref 0–125)

## 2013-06-01 LAB — COMPREHENSIVE METABOLIC PANEL
ALK PHOS: 85 U/L
ANION GAP: 5 — AB (ref 7–16)
Albumin: 3.5 g/dL (ref 3.4–5.0)
BILIRUBIN TOTAL: 1 mg/dL (ref 0.2–1.0)
BUN: 8 mg/dL (ref 7–18)
CALCIUM: 9.2 mg/dL (ref 8.5–10.1)
CREATININE: 0.77 mg/dL (ref 0.60–1.30)
Chloride: 102 mmol/L (ref 98–107)
Co2: 27 mmol/L (ref 21–32)
EGFR (Non-African Amer.): >60
GLUCOSE: 247 mg/dL — AB (ref 65–99)
Osmolality: 275 (ref 275–301)
Potassium: 3.7 mmol/L (ref 3.5–5.1)
SGOT(AST): 33 U/L (ref 15–37)
SGPT (ALT): 64 U/L (ref 12–78)
Sodium: 134 mmol/L — ABNORMAL LOW (ref 136–145)
TOTAL PROTEIN: 7.3 g/dL (ref 6.4–8.2)

## 2013-06-01 LAB — CK TOTAL AND CKMB (NOT AT ARMC)
CK, Total: 54 U/L
CK-MB: <0.5 ng/mL — ABNORMAL LOW (ref 0.5–3.6)

## 2013-06-01 LAB — TROPONIN I

## 2013-06-01 NOTE — Telephone Encounter (Signed)
Pt called he needs to get a refill on hydrocodone Pt has been out of meds for 1 week Pt stated he just left er [facility] and they gave him tramadol Pt stated he cannot take tramadol Pt is aware that raquel is out of the office this afternoon

## 2013-06-02 NOTE — Telephone Encounter (Signed)
Voicemailbox full, sent myChart message.

## 2013-06-04 ENCOUNTER — Telehealth: Payer: Self-pay | Admitting: Adult Health

## 2013-06-04 MED ORDER — ASPIRIN 325 MG PO TABS: 325.0000 mg | ORAL_TABLET | Freq: Every day | ORAL | Status: DC

## 2013-06-04 NOTE — Telephone Encounter (Signed)
The patient wants it called to the pharmacy today in order to get his medication next week.  aspirin 325 MG tablet

## 2013-06-04 NOTE — Telephone Encounter (Signed)
Rx sent to pharmacy by escript  

## 2013-06-07 ENCOUNTER — Ambulatory Visit (INDEPENDENT_AMBULATORY_CARE_PROVIDER_SITE_OTHER): Payer: Medicare Other | Admitting: Adult Health

## 2013-06-07 ENCOUNTER — Encounter: Payer: Self-pay | Admitting: Adult Health

## 2013-06-07 VITALS — BP 122/80 | HR 72 | Resp 12 | Wt 185.0 lb

## 2013-06-07 DIAGNOSIS — M549 Dorsalgia, unspecified: Secondary | ICD-10-CM

## 2013-06-07 DIAGNOSIS — R52 Pain, unspecified: Secondary | ICD-10-CM

## 2013-06-07 DIAGNOSIS — G8929 Other chronic pain: Secondary | ICD-10-CM | POA: Insufficient documentation

## 2013-06-07 MED ORDER — PREDNISONE 10 MG PO TABS: ORAL_TABLET | ORAL | Status: DC

## 2013-06-07 MED ORDER — IBUPROFEN 600 MG PO TABS: 600.0000 mg | ORAL_TABLET | Freq: Four times a day (QID) | ORAL | Status: DC | PRN

## 2013-06-07 MED ORDER — CYCLOBENZAPRINE HCL 5 MG PO TABS: 5.0000 mg | ORAL_TABLET | Freq: Three times a day (TID) | ORAL | Status: DC | PRN

## 2013-06-07 NOTE — Progress Notes (Signed)
Subjective:    Patient ID: Kyle [name], male    DOB: 1961-01-24, 53 y.o.   MRN: [id number]  HPI  Pt is a pleasant 53 y/o male who presents with chronic back pain 2/2 DDD. He reports injuring his back while serving in the [employer] years ago.He does well for periods of time but occasionally has flare ups. Report his current flare up has been ongoing for ~ 1 week. Has taken tylenol without much relief and also ibuprofen which seems to help a little more.    Past Medical History  Diagnosis Date  . Asthma   . Diabetes mellitus without complication   . COPD (chronic obstructive pulmonary disease)   . Fainting spell   . GERD (gastroesophageal reflux disease)   . Allergy   . Hyperlipidemia   . Hypertension   . Urine incontinence   . CAD (coronary artery disease)   . 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 Chronic back pain 1981  . DDD (degenerative disc disease)      Past Surgical History  Procedure Laterality Date  . 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 Cardiac catheterization  11/2012    [facility]     Family History  Problem Relation Age of Onset  . Heart disease Mother   . Heart disease Father   . Hypertension Father   . Diabetes Sister   . Cancer Sister     skin cancer     History   Social History  . Marital Status: Single    Spouse Name: N/A    Number of Children: N/A  . Years of Education: 14   Occupational History  . 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 Smoking status: Former Smoker -- 35 years    Types: Cigarettes    Quit date: 11/24/2012  . Smokeless tobacco: Never Used  . Alcohol Use: No  . Drug Use: No  . Sexual Activity: Not on file   Other Topics Concern  . Not on file   Social History Narrative   Mr. Hausmann grew up in the [place] area. He was in the [employer] for 4 years. When he returned home, he worked in [employer] and [employer] up until he had a heart attack. He is currently  disabled. Mr. Strohmeier lives by himself but has excellent support system. He is very close to his family. He is currently taking online courses through [employer]. He is trying to obtain a degree in social services and ministry. His goal is to work with at-risk young adults.      Regular exercise: not recently   Caffeine use: occasionally     Review of Systems  Constitutional: Negative.   Respiratory: Negative.   Cardiovascular: Negative.   Gastrointestinal: Negative.   Genitourinary: Negative.   Musculoskeletal: Positive for back pain and neck pain.       Reports pain in cervical and thoracic area  Skin: Negative.   Neurological: Negative.   Psychiatric/Behavioral: Negative.        Objective:   Physical Exam  Constitutional: He is oriented to person, place, and time. He appears well-developed and well-nourished.  Appears uncomfortable  Cardiovascular: Normal rate and regular rhythm.   Pulmonary/Chest: Effort normal. No respiratory distress.  Musculoskeletal: He exhibits tenderness.  Guarded movements. Difficulty turning head. Decreased ROM.  Neurological: He is alert and oriented to  person, place, and time.  Skin: Skin is warm and dry.  Psychiatric: He has a normal mood and affect. His behavior is normal. Judgment and thought content normal.       Assessment & Plan:   1. Exacerbation of chronic back pain Start prednisone taper. After completed with taper then may take ibuprofen 600 mg every 6 hours for pain. Flexeril 5 mg tid for spasm  Referral to pain clinic made 2 weeks ago. Amber called for update and they advised that patient will be contacted by the end of this week with appointment.

## 2013-06-07 NOTE — Patient Instructions (Addendum)
  Start prednisone taper as follows:  Day #1 - take 6 tablets Day #2 - take 5 tablets Day #3 - take 4 tablets Day #4 - take 3 tablets Day #5 - take 2 tablets Day #6 - take 1 tablet  Flexeril 5 mg three times a day as needed for muscle spasms.  Take Ibuprofen 600 mg every 6 hours as needed for back pain. Do not start this until you are done with your Prednisone taper.  Apply ice alternating with heat to the affected areas for 15 min at a time. Do this approximately 3-4 times daily.  Use a firm pillow between your knees when you lie on your side or under your knees when you lie on your back.  [facility] in [place] should be calling you by the end of the week with an appointment. If you have not heard from them by Thursday please call our office and ask for Amber.

## 2013-06-07 NOTE — Progress Notes (Signed)
Pre visit review using our clinic review tool, if applicable. No additional management support is needed unless otherwise documented below in the visit note. 

## 2013-07-16 ENCOUNTER — Telehealth: Payer: Self-pay | Admitting: Adult Health

## 2013-07-16 NOTE — Telephone Encounter (Signed)
Patient Information:  Caller Name: Jj  Phone: [phone]  Patient: Kyle [name]  Gender: Male  DOB: [birth year]  Age: 53 Years  PCP: Charolette Forward  Office Follow Up:  Does the office need to follow up with this patient?: No  Instructions For The Office: N/A  RN Note:  Onset 07/08/13 of dry mouth and frequent urination, especially at night.  States the urge to void awakens him; this is a new problem for him.  Blood sugars have been within his baseline limits.  States he has some swelling of scrotum as well.  Per urination pain protocol, ED disposition due to swollen scrotum.  TC to office per office protocol; per staff, patient may go to ED.  Patient agrees; states will go to [facility]/ED.  krs/can  Symptoms  Reason For Call & Symptoms: urination problems  Reviewed Health History In EMR: Yes  Reviewed Medications In EMR: Yes  Reviewed Allergies In EMR: Yes  Reviewed Surgeries / Procedures: Yes  Date of Onset of Symptoms: 07/08/2013  Guideline(s) Used:  Urination Pain - Male  Disposition Per Guideline:   Go to ED Now (or to Office with PCP Approval)  Reason For Disposition Reached:   Swollen scrotum  Advice Given:  N/A  Patient Will Follow Care Advice:  YES

## 2013-07-16 NOTE — Telephone Encounter (Signed)
FYI: Sent message to Raquel & Almyra Free

## 2013-07-18 ENCOUNTER — Emergency Department: Payer: Self-pay | Admitting: Emergency Medicine

## 2013-07-18 LAB — CBC
HCT: 50 % (ref 40.0–52.0)
HGB: 15.9 g/dL (ref 13.0–18.0)
MCH: 27.3 pg (ref 26.0–34.0)
MCHC: 31.8 g/dL — AB (ref 32.0–36.0)
MCV: 86 fL (ref 80–100)
Platelet: 109 10*3/uL — ABNORMAL LOW (ref 150–440)
RBC: 5.82 10*6/uL (ref 4.40–5.90)
RDW: 13.3 % (ref 11.5–14.5)
WBC: 8.7 10*3/uL (ref 3.8–10.6)

## 2013-07-18 LAB — COMPREHENSIVE METABOLIC PANEL
ALK PHOS: 94 U/L
Albumin: 4.1 g/dL (ref 3.4–5.0)
Anion Gap: 8 (ref 7–16)
BILIRUBIN TOTAL: 0.5 mg/dL (ref 0.2–1.0)
BUN: 13 mg/dL (ref 7–18)
CHLORIDE: 99 mmol/L (ref 98–107)
Calcium, Total: 9.5 mg/dL (ref 8.5–10.1)
Co2: 23 mmol/L (ref 21–32)
Creatinine: 0.87 mg/dL (ref 0.60–1.30)
EGFR (Non-African Amer.): >60
Glucose: 693 mg/dL (ref 65–99)
OSMOLALITY: 294 (ref 275–301)
POTASSIUM: 4.4 mmol/L (ref 3.5–5.1)
SGOT(AST): 51 U/L — ABNORMAL HIGH (ref 15–37)
SGPT (ALT): 84 U/L — ABNORMAL HIGH (ref 12–78)
SODIUM: 130 mmol/L — AB (ref 136–145)
TOTAL PROTEIN: 7.8 g/dL (ref 6.4–8.2)

## 2013-07-18 LAB — URINALYSIS, COMPLETE
Bacteria: NONE SEEN
Bilirubin,UR: NEGATIVE
Blood: NEGATIVE
Glucose,UR: >=500 mg/dL (ref 0–75)
Ketone: NEGATIVE
Leukocyte Esterase: NEGATIVE
Nitrite: NEGATIVE
PH: 5 (ref 4.5–8.0)
Protein: NEGATIVE
RBC,UR: NONE SEEN /HPF (ref 0–5)
Specific Gravity: 1.028 (ref 1.003–1.030)
Squamous Epithelial: NONE SEEN
WBC UR: <1 /HPF (ref 0–5)

## 2013-07-20 ENCOUNTER — Encounter: Payer: Self-pay | Admitting: Emergency Medicine

## 2013-07-26 ENCOUNTER — Telehealth: Payer: Self-pay | Admitting: Adult Health

## 2013-07-26 NOTE — Telephone Encounter (Signed)
Can you schedule him with [facility]?

## 2013-07-26 NOTE — Telephone Encounter (Signed)
Pt states he has spoken with [facility] regarding his pain clinic appt.  States they told him they cannot see him because he has Medicaid.  Pt asking what his options are.  States he has pain and needs to be seen asap.

## 2013-07-26 NOTE — Telephone Encounter (Signed)
I'm not 100% sure which other PC in our area accept Medicaid, and they are only open certain days- today not being one. I'm sure we can s/u with [facility] if agreeable.

## 2013-07-26 NOTE — Telephone Encounter (Signed)
[name], Can you help with referral to pain clinic for Mr. Kyle [name] that accepts Medicaid? Thanks Raquel

## 2013-07-27 NOTE — Telephone Encounter (Signed)
Spoke with [facility] office, Lavina Hamman, they asked that we fax over notes and demo- they will schedule and let us know.

## 2013-07-28 NOTE — Telephone Encounter (Signed)
Can you call pt and let him know that we are forwarding his information to Dr. Sharlet Salina for an appointment. In the meantime if he needs pain medication I will prescribe for him. Find out if he still has medication.

## 2013-07-28 NOTE — Telephone Encounter (Signed)
Left vm message for pt to return my call

## 2013-07-29 NOTE — Telephone Encounter (Signed)
Pt stated that he has one flexeril left and plenty of ibuprofen but the ibuprofen is beginning to make him feel sick

## 2013-07-30 ENCOUNTER — Other Ambulatory Visit: Payer: Self-pay | Admitting: Adult Health

## 2013-07-30 MED ORDER — CYCLOBENZAPRINE HCL 10 MG PO TABS: 10.0000 mg | ORAL_TABLET | Freq: Three times a day (TID) | ORAL | Status: DC | PRN

## 2013-07-30 NOTE — Telephone Encounter (Signed)
Patient needs to take the ibuprofen with food. He needs to increase his prilosec to twice a day

## 2013-08-02 NOTE — Telephone Encounter (Signed)
Left message on vm advising pt

## 2013-08-06 NOTE — Telephone Encounter (Signed)
Joellen called back to make [place] aware they have been trying to contact the patient to schedule an apt, he has not returned calls.

## 2013-08-06 NOTE — Telephone Encounter (Signed)
Spoke with Joellen at [employer] office, she is checking to see if [facility] has approved to see pt

## 2013-08-12 ENCOUNTER — Telehealth: Payer: Self-pay | Admitting: Adult Health

## 2013-08-12 NOTE — Telephone Encounter (Signed)
Pt called again, states the bill has been sent to a collection agency.

## 2013-08-12 NOTE — Telephone Encounter (Signed)
Pt states he has received another bill from [employer].  Pt states he has spoken with someone here regarding a bill in the last month or so; does not remember who he spoke with.  States he was told it would be taken care of but he is still receiving bills from [place].  Asking for a call from the office manager.

## 2013-08-13 ENCOUNTER — Telehealth: Payer: Self-pay | Admitting: Emergency Medicine

## 2013-08-13 NOTE — Telephone Encounter (Signed)
Pt called and stated he has called the [facility] who told him they could not see him. Referral underway to [place] office, pt aware they are only open on Monday/Wednesdays.

## 2013-08-18 NOTE — Telephone Encounter (Signed)
Spoke with [employer] office, with Minette Brine. She stated [facility] has accepted the patient, however there are not any open slots for apts. They will call him when some open.

## 2013-08-18 NOTE — Telephone Encounter (Signed)
What does this mean? What are the chances he will get appointment? We should pursue the [facility] one that is across the hospital

## 2013-08-19 NOTE — Telephone Encounter (Signed)
He will have an apt, I'm just not sure how soon. They are only open Mon/Wed and already seeing 25 patients/day. Referral underway to [facility] on [address]. They are only open one day/week.

## 2013-08-19 NOTE — Telephone Encounter (Signed)
Thanks Amber. 

## 2013-08-26 ENCOUNTER — Ambulatory Visit (INDEPENDENT_AMBULATORY_CARE_PROVIDER_SITE_OTHER): Payer: Medicare Other | Admitting: Adult Health

## 2013-08-26 ENCOUNTER — Encounter: Payer: Self-pay | Admitting: Adult Health

## 2013-08-26 VITALS — BP 107/74 | HR 105 | Temp 98.1°F | Resp 15 | Wt 173.0 lb

## 2013-08-26 DIAGNOSIS — IMO0001 Reserved for inherently not codable concepts without codable children: Secondary | ICD-10-CM

## 2013-08-26 DIAGNOSIS — E785 Hyperlipidemia, unspecified: Secondary | ICD-10-CM

## 2013-08-26 DIAGNOSIS — H538 Other visual disturbances: Secondary | ICD-10-CM

## 2013-08-26 DIAGNOSIS — IMO0002 Reserved for concepts with insufficient information to code with codable children: Secondary | ICD-10-CM

## 2013-08-26 DIAGNOSIS — B1920 Unspecified viral hepatitis C without hepatic coma: Secondary | ICD-10-CM

## 2013-08-26 DIAGNOSIS — L299 Pruritus, unspecified: Secondary | ICD-10-CM

## 2013-08-26 DIAGNOSIS — I10 Essential (primary) hypertension: Secondary | ICD-10-CM

## 2013-08-26 DIAGNOSIS — M549 Dorsalgia, unspecified: Secondary | ICD-10-CM

## 2013-08-26 DIAGNOSIS — G8929 Other chronic pain: Secondary | ICD-10-CM

## 2013-08-26 DIAGNOSIS — E1165 Type 2 diabetes mellitus with hyperglycemia: Secondary | ICD-10-CM

## 2013-08-26 DIAGNOSIS — L84 Corns and callosities: Secondary | ICD-10-CM

## 2013-08-26 MED ORDER — HYDROCODONE-ACETAMINOPHEN 5-325 MG PO TABS: 1.0000 | ORAL_TABLET | Freq: Three times a day (TID) | ORAL | Status: DC | PRN

## 2013-08-26 MED ORDER — METHOCARBAMOL 750 MG PO TABS: 750.0000 mg | ORAL_TABLET | Freq: Three times a day (TID) | ORAL | Status: DC | PRN

## 2013-08-26 NOTE — Progress Notes (Signed)
Pre visit review using our clinic review tool, if applicable. No additional management support is needed unless otherwise documented below in the visit note. 

## 2013-08-26 NOTE — Patient Instructions (Addendum)
  You need to follow up with your heart doctor, Dr. Rockey Situ. Friday, May 8th at 11:15 am [phone]  I am referring you to:   Ophthalmology for your blurred vision and diabetic eye exam   Podiatry for your calluses   Gastroenterology to follow up on your hepatitis C    I am checking labs today. We will contact you with your results and further instructions.  Stop the flexeril. I am changing your muscle relaxer to Robaxin. You may take this for muscle spasms 3 times a day as needed.  I have given you a prescription for hydrocodone for your back pain. You may take 1 tablet every 8 hours as needed for back pain. Please continue to call the Pain Clinic to see if you can get an appointment. We have also referred you to [facility] in [place] to see if we can get you in there sooner.

## 2013-08-26 NOTE — Progress Notes (Signed)
Subjective:    Patient ID: Kyle [name], male    DOB: 1960/07/12, 53 y.o.   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 Started two weeks ago. No dietary changes. The only new medication he has taken in the past 2 weeks is flexeril. Denies changing soaps, lotions, detergents, cologne or body washes. Itching is all day. No rashes or any skin breakouts noted. Has been taking atarax without any improvement.  2) Patient is also experiencing back pain. History of chronic back pain 2/2 degenerative disk dz. He has been referred to [facility]. They have already contacted him but have advised him that they do not currently have an appointment available for him. Unfortunately, this clinic only sees patients in the area twice a week. They will call him with an appointment to establish with them.   3) He is reporting blurring vision. He is not doing well with diet pertaining to his diabetes. He reports that he is administering novolin 70-30 every am 27 units and every pm 15 units. He is also taking metformin. Mr. Hanken has not been checking his blood glucose levels regularly so he cannot tell me how his levels are.  4) Calluses of his feet. They are uncomfortable. He is also having trouble cutting his toenails.   5) Pertaining to his hepatitis C, he reports having some treatment in the past but cannot tell me anything further. He has not been seen by any GI physician in years.  6) Blood pressure is well controlled. He takes his medication as prescribed. He is followed by Dr. Rockey Situ. Pt does not know when his appt with Dr. Rockey Situ is next.   Review of Systems  Constitutional: Negative.  Negative for fatigue.  HENT: Negative.   Eyes: Positive for visual disturbance (blurred vision).  Respiratory: Negative.   Cardiovascular:  Negative.   Gastrointestinal: Negative.   Endocrine: Negative.   Genitourinary: Negative.   Musculoskeletal: Positive for back pain and neck pain. Negative for gait problem.  Skin: Negative for rash.       Itching all over. Feet calluses  Allergic/Immunologic: Negative.   Neurological: Negative.   Hematological: Negative.   Psychiatric/Behavioral: Negative.        Objective:   Physical Exam  Constitutional: He is oriented to person, place, and time. He appears well-developed and well-nourished. No distress.  HENT:  Head: Normocephalic and atraumatic.  Eyes: Conjunctivae and EOM are normal.  Cardiovascular: Normal rate, regular rhythm, normal heart sounds and intact distal pulses.  Exam reveals no gallop and no friction rub.   No murmur heard. Pulmonary/Chest: Effort normal and breath sounds normal. No respiratory distress. He has no wheezes. He has no rales.  Musculoskeletal: Normal range of motion.  Neurological: He is alert and oriented to person, place, and time.  Skin: Skin is warm and dry. No rash noted. No erythema.  Psychiatric: He has a normal mood and affect. His behavior is normal. Judgment and thought content normal.       Assessment & Plan:   1. HTN (hypertension) Well controlled. On medications. He is appearing unbothered when we discussed when his next appt with his cardiologist was. Pt just had a CABG x4 in August 2014. He shrugged his shoulders and said "they haven't called me". I had a discussion with him about him  taking some initiative and responsibility for his care. We called and made him an appointment with Dr. Rockey Situ for next week.   - CBC with Differential - Basic metabolic panel  2. HLD (hyperlipidemia) On medications. Checking his liver function. - Hepatic function panel  3. Diabetes mellitus type II, uncontrolled I am concerned that his diabetes is not well controlled. Discussed the importance of getting his diabetes controlled to prevent end  organ damage. He is a very pleasant man, but again, appears complacent. I am checking his A1c and will adjust meds as needed. - Hemoglobin A1c; Future - Ambulatory referral to Podiatry - Ambulatory referral to Ophthalmology - Hemoglobin A1c  4. Callus of foot Advised him not to try to cut the calluses. I am referring him to podiatry - Ambulatory referral to Podiatry  5. Hepatitis C Refer to GI for evaluation of his hepatitis C - Ambulatory referral to Gastroenterology  6. Blurred vision Refer to ophthalmology for diabetic eye exam. Blurring of vision is more than likely related to uncontrolled diabetes. - Ambulatory referral to Ophthalmology  7. Itching Only new medication that he has taken is flexeril. I advised him to stop taking this. I will send in a prescription for robaxin. Also advised to try benadryl for itching. It this continues may need referral to Derm. There are not rashes noted on his body. I am also checking bmet and hepatic panel.  8. Chronic back pain Pt is waiting for appointment with [facility] in [place]. I am giving him a prescription for Norco until he is able to establish with the pain clinic.

## 2013-08-27 ENCOUNTER — Telehealth: Payer: Self-pay | Admitting: Adult Health

## 2013-08-27 LAB — BASIC METABOLIC PANEL
BUN: 10 mg/dL (ref 6–23)
CHLORIDE: 98 meq/L (ref 96–112)
CO2: 24 mEq/L (ref 19–32)
Calcium: 9.9 mg/dL (ref 8.4–10.5)
Creatinine, Ser: 0.8 mg/dL (ref 0.4–1.5)
GFR: 123.84 mL/min (ref >60.00)
GLUCOSE: 423 mg/dL — AB (ref 70–99)
Potassium: 4.6 mEq/L (ref 3.5–5.1)
Sodium: 131 mEq/L — ABNORMAL LOW (ref 135–145)

## 2013-08-27 LAB — CBC WITH DIFFERENTIAL/PLATELET
BASOS PCT: 0.4 % (ref 0.0–3.0)
Basophils Absolute: 0 10*3/uL (ref 0.0–0.1)
EOS PCT: 1.8 % (ref 0.0–5.0)
Eosinophils Absolute: 0.2 10*3/uL (ref 0.0–0.7)
HCT: 45.3 % (ref 39.0–52.0)
Hemoglobin: 14.9 g/dL (ref 13.0–17.0)
LYMPHS PCT: 42.3 % (ref 12.0–46.0)
Lymphs Abs: 4.1 10*3/uL — ABNORMAL HIGH (ref 0.7–4.0)
MCHC: 33 g/dL (ref 30.0–36.0)
MCV: 85 fl (ref 78.0–100.0)
Monocytes Absolute: 0.7 10*3/uL (ref 0.1–1.0)
Monocytes Relative: 7.3 % (ref 3.0–12.0)
NEUTROS PCT: 48.2 % (ref 43.0–77.0)
Neutro Abs: 4.7 10*3/uL (ref 1.4–7.7)
Platelets: 132 10*3/uL — ABNORMAL LOW (ref 150.0–400.0)
RBC: 5.33 Mil/uL (ref 4.22–5.81)
RDW: 13 % (ref 11.5–14.6)
WBC: 9.7 10*3/uL (ref 4.5–10.5)

## 2013-08-27 LAB — HEPATIC FUNCTION PANEL
ALBUMIN: 4.5 g/dL (ref 3.5–5.2)
ALT: 76 U/L — ABNORMAL HIGH (ref 0–53)
AST: 39 U/L — AB (ref 0–37)
Alkaline Phosphatase: 62 U/L (ref 39–117)
Bilirubin, Direct: 0.2 mg/dL (ref 0.0–0.3)
Total Bilirubin: 0.9 mg/dL (ref 0.3–1.2)
Total Protein: 7.7 g/dL (ref 6.0–8.3)

## 2013-08-27 LAB — HEMOGLOBIN A1C

## 2013-08-27 NOTE — Telephone Encounter (Signed)
Relevant patient education assigned to patient using Emmi. ° °

## 2013-08-28 ENCOUNTER — Other Ambulatory Visit: Payer: Self-pay | Admitting: Adult Health

## 2013-08-28 ENCOUNTER — Encounter: Payer: Self-pay | Admitting: Adult Health

## 2013-08-28 DIAGNOSIS — IMO0002 Reserved for concepts with insufficient information to code with codable children: Secondary | ICD-10-CM

## 2013-08-28 DIAGNOSIS — E1165 Type 2 diabetes mellitus with hyperglycemia: Secondary | ICD-10-CM

## 2013-08-29 DIAGNOSIS — I10 Essential (primary) hypertension: Secondary | ICD-10-CM | POA: Insufficient documentation

## 2013-08-29 DIAGNOSIS — L84 Corns and callosities: Secondary | ICD-10-CM | POA: Insufficient documentation

## 2013-08-29 DIAGNOSIS — L299 Pruritus, unspecified: Secondary | ICD-10-CM | POA: Insufficient documentation

## 2013-08-29 DIAGNOSIS — H538 Other visual disturbances: Secondary | ICD-10-CM | POA: Insufficient documentation

## 2013-08-29 DIAGNOSIS — E785 Hyperlipidemia, unspecified: Secondary | ICD-10-CM | POA: Insufficient documentation

## 2013-09-03 ENCOUNTER — Ambulatory Visit (INDEPENDENT_AMBULATORY_CARE_PROVIDER_SITE_OTHER): Payer: Medicare Other | Admitting: Podiatry

## 2013-09-03 ENCOUNTER — Encounter: Payer: Self-pay | Admitting: Cardiovascular Disease

## 2013-09-03 ENCOUNTER — Ambulatory Visit (INDEPENDENT_AMBULATORY_CARE_PROVIDER_SITE_OTHER): Payer: Medicare Other | Admitting: Cardiovascular Disease

## 2013-09-03 ENCOUNTER — Encounter: Payer: Self-pay | Admitting: Podiatry

## 2013-09-03 VITALS — BP 108/68 | HR 78 | Resp 16 | Ht 67.0 in | Wt 174.0 lb

## 2013-09-03 VITALS — BP 110/80 | HR 75 | Ht 67.0 in | Wt 176.0 lb

## 2013-09-03 DIAGNOSIS — E1165 Type 2 diabetes mellitus with hyperglycemia: Secondary | ICD-10-CM

## 2013-09-03 DIAGNOSIS — I10 Essential (primary) hypertension: Secondary | ICD-10-CM

## 2013-09-03 DIAGNOSIS — Z794 Long term (current) use of insulin: Principal | ICD-10-CM

## 2013-09-03 DIAGNOSIS — Z87891 Personal history of nicotine dependence: Secondary | ICD-10-CM

## 2013-09-03 DIAGNOSIS — I25810 Atherosclerosis of coronary artery bypass graft(s) without angina pectoris: Secondary | ICD-10-CM

## 2013-09-03 DIAGNOSIS — E119 Type 2 diabetes mellitus without complications: Secondary | ICD-10-CM

## 2013-09-03 DIAGNOSIS — E785 Hyperlipidemia, unspecified: Secondary | ICD-10-CM

## 2013-09-03 DIAGNOSIS — IMO0001 Reserved for inherently not codable concepts without codable children: Secondary | ICD-10-CM

## 2013-09-03 DIAGNOSIS — B372 Candidiasis of skin and nail: Secondary | ICD-10-CM

## 2013-09-03 DIAGNOSIS — IMO0002 Reserved for concepts with insufficient information to code with codable children: Secondary | ICD-10-CM

## 2013-09-03 DIAGNOSIS — L259 Unspecified contact dermatitis, unspecified cause: Secondary | ICD-10-CM

## 2013-09-03 DIAGNOSIS — R0602 Shortness of breath: Secondary | ICD-10-CM

## 2013-09-03 MED ORDER — SIMVASTATIN 40 MG PO TABS: 40.0000 mg | ORAL_TABLET | Freq: Every day | ORAL | Status: DC

## 2013-09-03 MED ORDER — METOPROLOL TARTRATE 25 MG PO TABS: 37.5000 mg | ORAL_TABLET | Freq: Two times a day (BID) | ORAL | Status: DC

## 2013-09-03 NOTE — Patient Instructions (Signed)
You are doing well. Please restart simvastatin one a day for cholesterol  Please call us if you have new issues that need to be addressed before your next appt.  Your physician wants you to follow-up in: 6 months.  You will receive a reminder letter in the mail two months in advance. If you don't receive a letter, please call our office to schedule the follow-up appointment.   

## 2013-09-03 NOTE — Progress Notes (Signed)
Patient ID: Kyle [name], male    DOB: [birth year], 53 y.o.   MRN: [id number]  HPI Comments: Kyle [name] is a 53 year old gentleman with long history of poorly controlled diabetes, long history of smoking, hyperlipidemia, coronary artery disease, bypass surgery. He presents for routine followup  In followup today, he reports that his sugars continue to run high. He has mild general malaise though otherwise feels well. He reports that he ran out of his cholesterol medication some time ago. He does have most of his other medications. He reports that he is taking his insulin, trying to watch his diet  denies any significant chest pain or shortness of breath.   Previously had worsening shortness of breath, fatigue, slight chest pain prior to his recent bypass surgery. He had four-vessel bypass in July 2014 at the [facility] in [place]. Hospital is called the [facility].   EKG shows normal sinus rhythm with rate 75 beats per minute, right bundle branch block, left anterior fascicular block   Outpatient Encounter Prescriptions as of 09/03/2013  Medication Sig  . albuterol (PROVENTIL HFA;VENTOLIN HFA) 108 (90 BASE) MCG/ACT inhaler Inhale 2 puffs into the lungs as needed.   Marland Kitchen aspirin 325 MG tablet Take 1 tablet (325 mg total) by mouth daily.  Marland Kitchen gabapentin (NEURONTIN) 800 MG tablet Take 1,200 mg by mouth 3 (three) times daily.  Marland Kitchen glucose blood test strip 1 each by Other route as needed for other. Precision xtra test strip  . HYDROcodone-acetaminophen (NORCO/VICODIN) 5-325 MG per tablet Take 1 tablet by mouth every 8 (eight) hours as needed.  . hydrOXYzine (ATARAX/VISTARIL) 25 MG tablet Take 25 mg by mouth every 8 (eight) hours as needed for itching.  . insulin NPH-regular Human (NOVOLIN 70/30) (70-30) 100 UNIT/ML injection 25 units in the AM and 15 units with evening meal  . Insulin Syringes, Disposable, U-100 1 ML MISC To inject 70/30 bid  . LANCETS ULTRA FINE MISC To check blood sugar four  times daily  . metFORMIN (GLUCOPHAGE) 1000 MG tablet Take 1,000 mg by mouth 2 (two) times daily with a meal.  . methocarbamol (ROBAXIN) 750 MG tablet Take 1 tablet (750 mg total) by mouth 3 (three) times daily as needed for muscle spasms.  . metoprolol tartrate (LOPRESSOR) 25 MG tablet Take 37.5 mg by mouth 3 (three) times daily.  Marland Kitchen omeprazole (PRILOSEC) 20 MG capsule Take 1 capsule (20 mg total) by mouth daily.  . sildenafil (VIAGRA) 100 MG tablet Take 100 mg by mouth daily as needed for erectile dysfunction.    Review of Systems  Constitutional: Negative.   HENT: Negative.   Eyes: Negative.   Respiratory: Negative.   Cardiovascular: Negative.   Gastrointestinal: Negative.   Endocrine: Negative.   Musculoskeletal: Positive for back pain and neck pain.  Skin: Negative.   Allergic/Immunologic: Negative.   Neurological: Negative.   Hematological: Negative.   Psychiatric/Behavioral: Negative.   All other systems reviewed and are negative.   BP 110/80  Pulse 75  Ht 5\' 7"  (1.702 m)  Wt 176 lb (79.833 kg)  BMI 27.56 kg/m2  Physical Exam  Nursing note and vitals reviewed. Constitutional: He is oriented to person, place, and time. He appears well-developed and well-nourished.  HENT:  Head: Normocephalic.  Nose: Nose normal.  Mouth/Throat: Oropharynx is clear and moist.  Eyes: Conjunctivae are normal. Pupils are equal, round, and reactive to light.  Neck: Normal range of motion. Neck supple. No JVD present.  Cardiovascular: Normal rate, regular rhythm, S1  normal, S2 normal, normal heart sounds and intact distal pulses.  Exam reveals no gallop and no friction rub.   No murmur heard. Well healed mediastinal incision  Pulmonary/Chest: Effort normal and breath sounds normal. No respiratory distress. He has no wheezes. He has no rales. He exhibits no tenderness.  Abdominal: Soft. Bowel sounds are normal. He exhibits no distension. There is no tenderness.  Musculoskeletal: Normal range  of motion. He exhibits no edema and no tenderness.  Lymphadenopathy:    He has no cervical adenopathy.  Neurological: He is alert and oriented to person, place, and time. Coordination normal.  Skin: Skin is warm and dry. No rash noted. No erythema.  Psychiatric: He has a normal mood and affect. His behavior is normal. Judgment and thought content normal.      Assessment and Plan

## 2013-09-03 NOTE — Assessment & Plan Note (Signed)
He continues to smoke several cigarettes per day. Encouraged cessation

## 2013-09-03 NOTE — Assessment & Plan Note (Signed)
Currently with no symptoms of angina. No further workup at this time. We have refilled his cholesterol medication

## 2013-09-03 NOTE — Assessment & Plan Note (Signed)
Encouraged him to stay on his simvastatin. 

## 2013-09-03 NOTE — Progress Notes (Signed)
   Subjective:    Patient ID: Kyle [name], male    DOB: June 27, 1960, 54 y.o.   MRN: [id number]  HPI Comments: My doctor was concerned about the skin on my heel,since i try scraping it myself. Dry scaly sking Diabetes for 2 years last a1c was 11.  Diabetes      Review of Systems  Endocrine:       Diabetes        Objective:   Physical Exam        Assessment & Plan:

## 2013-09-03 NOTE — Assessment & Plan Note (Signed)
Blood pressure is well controlled on today's visit. No changes made to the medications. 

## 2013-09-03 NOTE — Assessment & Plan Note (Signed)
Recommended a strict diet, may need followup with endocrine

## 2013-09-05 NOTE — Progress Notes (Signed)
Subjective:     Patient ID: Kyle [name], male   DOB: 08-26-1960, 53 y.o.   MRN: [id number]  Diabetes   patient presents with dry scaly skin of both feet and heels that are crusted with no fissures noted but bothersome   Review of Systems  All other systems reviewed and are negative.      Objective:   Physical Exam  Nursing note and vitals reviewed. Constitutional: He is oriented to person, place, and time.  Musculoskeletal: Normal range of motion.  Neurological: He is oriented to person, place, and time.  Skin: Skin is dry.   patient is found to have diminishment of pulses PT DP bilateral and diminishment in vibratory and sharp call both feet. Found to have dry feet with crusted-like areas but no indications of breakdown fissuring or drainage. Does have diminishment of muscle strength and diminishment of range of motion noted both feet     Assessment:     Possible fungal infection versus generalized dermatitis of both feet with diabetes as problem with this condition    Plan:     H&P and diabetic education rendered at this time I recommended Vaseline under occlusion along with topical medication for condition. Reappoint if it does not improve or if any changes should occur

## 2013-09-07 ENCOUNTER — Telehealth: Payer: Self-pay

## 2013-09-07 NOTE — Telephone Encounter (Signed)
Relevant patient education assigned to patient using Emmi. ° °

## 2013-09-13 ENCOUNTER — Other Ambulatory Visit: Payer: Self-pay | Admitting: *Deleted

## 2013-09-13 MED ORDER — METFORMIN HCL 1000 MG PO TABS: 1000.0000 mg | ORAL_TABLET | Freq: Two times a day (BID) | ORAL | Status: DC

## 2013-09-13 NOTE — Telephone Encounter (Signed)
Pt requested refills via mychart for Metformin and BP medication. Metoprolol last refilled 09/03/13. Metformin sent to pharmacy.

## 2013-09-14 ENCOUNTER — Other Ambulatory Visit: Payer: Self-pay | Admitting: Adult Health

## 2013-09-15 ENCOUNTER — Telehealth: Payer: Self-pay

## 2013-09-15 NOTE — Telephone Encounter (Signed)
Spoke with patient he did not change pharmacies, I told him is Rx for metformin and B/P med was sent to his pharmacy. He also told me that he received a phone call this morning about his appointment for the pain clinic.

## 2013-09-27 ENCOUNTER — Ambulatory Visit: Payer: Medicare Other | Admitting: Adult Health

## 2013-09-29 ENCOUNTER — Ambulatory Visit: Payer: Medicare Other | Admitting: Adult Health

## 2013-10-01 ENCOUNTER — Encounter: Payer: Self-pay | Admitting: Adult Health

## 2013-10-01 ENCOUNTER — Ambulatory Visit (INDEPENDENT_AMBULATORY_CARE_PROVIDER_SITE_OTHER): Payer: Medicare Other | Admitting: Adult Health

## 2013-10-01 VITALS — BP 122/86 | HR 89 | Temp 97.8°F | Resp 14 | Ht 67.0 in | Wt 172.8 lb

## 2013-10-01 DIAGNOSIS — IMO0001 Reserved for inherently not codable concepts without codable children: Secondary | ICD-10-CM

## 2013-10-01 DIAGNOSIS — IMO0002 Reserved for concepts with insufficient information to code with codable children: Secondary | ICD-10-CM

## 2013-10-01 DIAGNOSIS — N489 Disorder of penis, unspecified: Secondary | ICD-10-CM

## 2013-10-01 DIAGNOSIS — R899 Unspecified abnormal finding in specimens from other organs, systems and tissues: Secondary | ICD-10-CM

## 2013-10-01 DIAGNOSIS — M549 Dorsalgia, unspecified: Secondary | ICD-10-CM

## 2013-10-01 DIAGNOSIS — Z23 Encounter for immunization: Secondary | ICD-10-CM

## 2013-10-01 DIAGNOSIS — N4889 Other specified disorders of penis: Secondary | ICD-10-CM

## 2013-10-01 DIAGNOSIS — I25810 Atherosclerosis of coronary artery bypass graft(s) without angina pectoris: Secondary | ICD-10-CM

## 2013-10-01 DIAGNOSIS — E1165 Type 2 diabetes mellitus with hyperglycemia: Secondary | ICD-10-CM

## 2013-10-01 DIAGNOSIS — L299 Pruritus, unspecified: Secondary | ICD-10-CM

## 2013-10-01 DIAGNOSIS — R6889 Other general symptoms and signs: Secondary | ICD-10-CM

## 2013-10-01 LAB — BASIC METABOLIC PANEL
BUN: 9 mg/dL (ref 6–23)
CALCIUM: 10 mg/dL (ref 8.4–10.5)
CO2: 28 meq/L (ref 19–32)
CREATININE: 0.7 mg/dL (ref 0.4–1.5)
Chloride: 103 mEq/L (ref 96–112)
GFR: 164.14 mL/min (ref >60.00)
GLUCOSE: 199 mg/dL — AB (ref 70–99)
Potassium: 5.1 mEq/L (ref 3.5–5.1)
Sodium: 138 mEq/L (ref 135–145)

## 2013-10-01 LAB — CBC WITH DIFFERENTIAL/PLATELET
Basophils Absolute: 0.1 10*3/uL (ref 0.0–0.1)
Basophils Relative: 0.5 % (ref 0.0–3.0)
EOS PCT: 2.4 % (ref 0.0–5.0)
Eosinophils Absolute: 0.2 10*3/uL (ref 0.0–0.7)
HCT: 46.7 % (ref 39.0–52.0)
Hemoglobin: 15 g/dL (ref 13.0–17.0)
Lymphocytes Relative: 39.8 % (ref 12.0–46.0)
Lymphs Abs: 3.9 10*3/uL (ref 0.7–4.0)
MCHC: 32.2 g/dL (ref 30.0–36.0)
MCV: 86.5 fl (ref 78.0–100.0)
MONOS PCT: 7.6 % (ref 3.0–12.0)
Monocytes Absolute: 0.7 10*3/uL (ref 0.1–1.0)
NEUTROS PCT: 49.7 % (ref 43.0–77.0)
Neutro Abs: 4.9 10*3/uL (ref 1.4–7.7)
PLATELETS: 146 10*3/uL — AB (ref 150.0–400.0)
RBC: 5.39 Mil/uL (ref 4.22–5.81)
RDW: 13.9 % (ref 11.5–15.5)
WBC: 9.9 10*3/uL (ref 4.0–10.5)

## 2013-10-01 LAB — HEMOGLOBIN A1C: Hgb A1c MFr Bld: 13.4 % — ABNORMAL HIGH (ref 4.6–6.5)

## 2013-10-01 NOTE — Patient Instructions (Signed)
  Please have labs drawn prior to leaving the office.  We will contact you once the results are available.  I am referring to you to urology for evaluation of the area of concern.  Continue insulin as you have been doing. We will adjust dose as needed.  Return for followup in 3 months

## 2013-10-01 NOTE — Progress Notes (Signed)
Pre visit review using our clinic review tool, if applicable. No additional management support is needed unless otherwise documented below in the visit note. 

## 2013-10-01 NOTE — Progress Notes (Signed)
Patient ID: Kyle [name], male   DOB: [birth year], 53 y.o.   MRN: [id number]   Subjective:    Patient ID: Kyle [name], male    DOB: Sep 04, 1960, 53 y.o.   MRN: [id number]  HPI  Patient is a pleasant 53 year old male with multiple medical problems including coronary artery disease status post CABG, hypertension, hyperlipidemia, COPD, uncontrolled diabetes on insulin, chronic back pain secondary to degenerative disc disease who presents to clinic with several concerns:  1.  Diabetes uncontrolled. He reports that he has been taking 30 units of insulin in the morning and 20 units of insulin in the evening. Last hemoglobin A1c was 15.3%. He reports he is doing better with his diet. He is here for followup.  2.  Colonoscopy. He received notification that he needed to have a colonoscopy. This was done December of 2014 and was normal. Next scheduled colonoscopy 10 years.  3.  He needs his tetanus vaccine.  4.  He reports having a lesion on his penis that he first noticed approximately 2 weeks ago. There is no discharge, pain associated with this.  5.  Chronic upper back pain from DDD. He has been referred to Pain Clinic but they have not called him with appointment. Takes hydrocodone as needed for pain. Does not need any refills at present. Rate pain is between 6 - 8/10 on pain scale. No numbness and tingling of extremities.   6. He had some abnormal laboratory results and he is here for follow up to have these re-drawn.  7. Continue to itch. No rash, lesions on his skin. He has not changed soaps. Reports the only change has been changing from generic bleach to clorox.  Past Medical History  Diagnosis Date  . Asthma   . Diabetes mellitus without complication   . COPD (chronic obstructive pulmonary disease)   . Fainting spell   . GERD (gastroesophageal reflux disease)   . Allergy   . Hyperlipidemia   . Hypertension   . Urine incontinence   . CAD (coronary artery disease)   . Hepatitis C    2/2 tatoo  . Chronic back pain 1981  . DDD (degenerative disc disease)     Current Outpatient Prescriptions on File Prior to Visit  Medication Sig Dispense Refill  . albuterol (PROVENTIL HFA;VENTOLIN HFA) 108 (90 BASE) MCG/ACT inhaler Inhale 2 puffs into the lungs as needed.       Marland Kitchen aspirin 325 MG tablet Take 1 tablet (325 mg total) by mouth daily.  30 tablet  5  . gabapentin (NEURONTIN) 800 MG tablet Take 1,200 mg by mouth 3 (three) times daily.      Marland Kitchen glucose blood test strip 1 each by Other route as needed for other. Precision xtra test strip      . HYDROcodone-acetaminophen (NORCO/VICODIN) 5-325 MG per tablet Take 1 tablet by mouth every 8 (eight) hours as needed.  90 tablet  0  . hydrOXYzine (ATARAX/VISTARIL) 25 MG tablet Take 25 mg by mouth every 8 (eight) hours as needed for itching.      . Insulin Syringes, Disposable, U-100 1 ML MISC To inject 70/30 bid  100 each  6  . LANCETS ULTRA FINE MISC To check blood sugar four times daily  100 each  6  . metFORMIN (GLUCOPHAGE) 1000 MG tablet Take 1 tablet (1,000 mg total) by mouth 2 (two) times daily with a meal.  60 tablet  5  . methocarbamol (ROBAXIN) 750 MG tablet Take 1 tablet (750 mg total)  by mouth 3 (three) times daily as needed for muscle spasms.  45 tablet  1  . metoprolol tartrate (LOPRESSOR) 25 MG tablet Take 1.5 tablets (37.5 mg total) by mouth 2 (two) times daily.  135 tablet  3  . omeprazole (PRILOSEC) 20 MG capsule Take 1 capsule (20 mg total) by mouth daily.  30 capsule  3  . sildenafil (VIAGRA) 100 MG tablet Take 100 mg by mouth daily as needed for erectile dysfunction.      . simvastatin (ZOCOR) 40 MG tablet Take 1 tablet (40 mg total) by mouth at bedtime.  90 tablet  3   No current facility-administered medications on file prior to visit.     Review of Systems  Constitutional: Negative.   HENT: Negative.   Eyes: Negative.   Respiratory: Negative.   Cardiovascular: Negative.   Gastrointestinal: Negative.     Endocrine: Negative.   Genitourinary: Negative.  Negative for discharge, scrotal swelling, penile pain and testicular pain.       Lesion on penis  Musculoskeletal: Positive for back pain (chronic pain - 6-8/10).  Skin: Negative.   Allergic/Immunologic: Negative.   Neurological: Negative.   Hematological: Negative.   Psychiatric/Behavioral: Negative.        Objective:  BP 122/86  Pulse 89  Temp(Src) 97.8 F (36.6 C) (Oral)  Resp 14  Ht 5\' 7"  (1.702 m)  Wt 172 lb 12 oz (78.359 kg)  BMI 27.05 kg/m2  SpO2 97%   Physical Exam  Constitutional: He is oriented to person, place, and time. He appears well-developed and well-nourished. No distress.  HENT:  Head: Normocephalic and atraumatic.  Eyes: Conjunctivae and EOM are normal.  Cardiovascular: Normal rate, regular rhythm, normal heart sounds and intact distal pulses.  Exam reveals no gallop and no friction rub.   No murmur heard. Pulmonary/Chest: Effort normal and breath sounds normal. No respiratory distress. He has no wheezes. He has no rales.  Genitourinary:    No penile tenderness.  Musculoskeletal: Normal range of motion. He exhibits tenderness.  Pain in thoracic area of spine  Neurological: He is alert and oriented to person, place, and time.  Skin: Skin is warm and dry.  Psychiatric: He has a normal mood and affect. His behavior is normal. Judgment and thought content normal.      Assessment & Plan:   1. Diabetes type 2, uncontrolled Has increased insulin. Now taking 30 units in the AM and 20 in the PM. Check A1c and adjust meds as needed. - Hemoglobin A1c; Future - Hemoglobin A1c  2. Need for prophylactic vaccination with tetanus-diphtheria (TD) Received tetanus vaccine in clinic today - update health maintenance. - Td vaccine preservative free greater than or equal to 7yo IM  3. Penile lesion Hard, 1in palpable lesion. No skin breakdown noted. He reports penis slants towards the left (slightly) when erection.  Will have him seen by urology for further evaluation. - Ambulatory referral to Urology  4. Back pain Ongoing chronic problems. Follow up with Pain Clinic consult. No refills of medications needed at this time  5. Itching Asked patient to stop using the clorox to see if this improves symptoms. Also asked him to try Aveeno product or ivory soap. May be secondary to medication side effect (??). Atarax prn for itching  6. Abnormal laboratory test Recheck his previously abnormal labs. - CBC with Differential - Basic metabolic panel

## 2013-10-04 ENCOUNTER — Ambulatory Visit: Payer: Self-pay | Admitting: Gastroenterology

## 2013-10-04 ENCOUNTER — Other Ambulatory Visit: Payer: Medicare Other

## 2013-10-22 ENCOUNTER — Encounter: Payer: Self-pay | Admitting: Gastroenterology

## 2013-10-27 ENCOUNTER — Ambulatory Visit: Payer: Medicare Other | Admitting: *Deleted

## 2013-11-03 ENCOUNTER — Telehealth: Payer: Self-pay

## 2013-11-03 NOTE — Telephone Encounter (Signed)
I spoke with pt. He saw Dr. Jacqlyn Larsen today and wanted to make me aware that he was provided with different options for treatment but he could not tell me about treatment for what. I told him that as soon as I get Dr. Bjorn Loser consult note I would call him to discuss.

## 2013-11-03 NOTE — Telephone Encounter (Signed)
Patient called and asked to speak with Raquel. I told patient that Raquel was currently seeing patients right now and asked if there was anything I could help him with. Patient stated that he really needed to speak with Raquel. I asked regarding what. Patient stated that it was regarding a private matter that he has spoken with Raquel about. I told him I would give Raquel the message.

## 2013-11-16 ENCOUNTER — Emergency Department: Payer: Self-pay | Admitting: Internal Medicine

## 2013-11-19 ENCOUNTER — Encounter: Payer: Self-pay | Admitting: *Deleted

## 2013-11-19 DIAGNOSIS — E1165 Type 2 diabetes mellitus with hyperglycemia: Principal | ICD-10-CM

## 2013-11-19 DIAGNOSIS — IMO0001 Reserved for inherently not codable concepts without codable children: Secondary | ICD-10-CM

## 2013-11-19 NOTE — Progress Notes (Signed)
Chart reviewed for DM bundle. Last OV and labs 10/01/13 No LDL documented. Sent mychart message on need for fasting labs.  Lab orders placed

## 2013-11-19 NOTE — Addendum Note (Signed)
Addended by: Wynonia Lawman E on: 11/19/2013 03:27 PM   Modules accepted: Orders

## 2013-11-22 NOTE — Telephone Encounter (Signed)
Mailed unread message to pt  

## 2013-11-24 ENCOUNTER — Other Ambulatory Visit (INDEPENDENT_AMBULATORY_CARE_PROVIDER_SITE_OTHER): Payer: Medicare Other

## 2013-11-24 DIAGNOSIS — IMO0001 Reserved for inherently not codable concepts without codable children: Secondary | ICD-10-CM

## 2013-11-24 DIAGNOSIS — E1165 Type 2 diabetes mellitus with hyperglycemia: Principal | ICD-10-CM

## 2013-11-24 LAB — LDL CHOLESTEROL, DIRECT: Direct LDL: 74.1 mg/dL

## 2013-11-24 LAB — LIPID PANEL
CHOL/HDL RATIO: 4
Cholesterol: 135 mg/dL (ref 0–200)
HDL: 34.5 mg/dL — ABNORMAL LOW (ref >39.00)
NONHDL: 100.5
Triglycerides: 238 mg/dL — ABNORMAL HIGH (ref 0.0–149.0)
VLDL: 47.6 mg/dL — ABNORMAL HIGH (ref 0.0–40.0)

## 2013-11-25 ENCOUNTER — Ambulatory Visit: Payer: Medicare Other | Admitting: *Deleted

## 2013-11-26 ENCOUNTER — Encounter: Payer: Self-pay | Admitting: Adult Health

## 2013-12-07 ENCOUNTER — Telehealth: Payer: Self-pay | Admitting: *Deleted

## 2013-12-07 NOTE — Telephone Encounter (Signed)
Pt called states he has not heard anything from the pain clinic in reference to an appoint.  Will you please check on this.

## 2013-12-07 NOTE — Telephone Encounter (Signed)
Called pt, patient stated he has been in contact with the pain clinic, and they are going to re-schedule him.

## 2013-12-09 ENCOUNTER — Telehealth: Payer: Self-pay | Admitting: Adult Health

## 2013-12-09 NOTE — Telephone Encounter (Signed)
Spoke with patient and patient stated that he has not been seen at the pain clinic yet. Spoke with Hoyle Sauer (referral coordinator)  and she stated that patient was referred to [facility] and patient no showed that appt. Than patient asked to be scheduled at the [facility] pain clinic. Patient had an appt scheduled with them in July and no showed that appt as well. [facility] rescheduled him to come in 01/10/14 at 1pm. If patient no shows again he will not be able to be seen there. Patient asking for refill on pain medication until he is seen in sept. Please advise.

## 2013-12-09 NOTE — Telephone Encounter (Signed)
Call pt to find out if he has been seen yet at the Pain Clinic? Is this an initial appt reschedule? Find out what pain clinic and then we need to call to confirm this is correct.

## 2013-12-09 NOTE — Telephone Encounter (Signed)
Patient called stating he had an appointment schedule with the pain clinic. They called and asked to reschedule his appointment for mid sept. Patient stated that he is completely out of pain medication and want to know if you would give him a month supply of his hydrocodone-ace 5-325mg ? Please advise.

## 2013-12-09 NOTE — Telephone Encounter (Signed)
Pt called in and stated would like a call back. Stated was going to pain clinic but shut out till September and medication expired and is completely out.

## 2013-12-10 ENCOUNTER — Other Ambulatory Visit: Payer: Self-pay

## 2013-12-10 ENCOUNTER — Other Ambulatory Visit: Payer: Self-pay | Admitting: Adult Health

## 2013-12-10 MED ORDER — HYDROCODONE-ACETAMINOPHEN 5-325 MG PO TABS: 1.0000 | ORAL_TABLET | Freq: Three times a day (TID) | ORAL | Status: DC | PRN

## 2013-12-10 NOTE — Telephone Encounter (Signed)
Patient notified Rx ready for pick up. 

## 2013-12-10 NOTE — Telephone Encounter (Signed)
I submitted this prescription to print. I need to speak with him when he arrives to pick up prescription.

## 2013-12-24 ENCOUNTER — Ambulatory Visit: Payer: Medicare Other | Admitting: Dietician

## 2013-12-24 ENCOUNTER — Telehealth: Payer: Self-pay

## 2013-12-24 NOTE — Telephone Encounter (Signed)
[facility] called stating the patient had been going to a [employer] office for diabetic nutrition management.  He now wants to go to [facility].  However, the referral was sent to [facility] in May and continues to be a valid referral for the patient to have diabetic nutrition management.

## 2014-01-04 ENCOUNTER — Ambulatory Visit: Payer: Medicare Other | Admitting: Adult Health

## 2014-01-05 ENCOUNTER — Ambulatory Visit (INDEPENDENT_AMBULATORY_CARE_PROVIDER_SITE_OTHER): Payer: Medicare Other | Admitting: Adult Health

## 2014-01-05 ENCOUNTER — Encounter: Payer: Self-pay | Admitting: Adult Health

## 2014-01-05 VITALS — BP 132/90 | HR 100 | Temp 98.1°F | Resp 14 | Ht 67.0 in | Wt 178.2 lb

## 2014-01-05 DIAGNOSIS — IMO0001 Reserved for inherently not codable concepts without codable children: Secondary | ICD-10-CM

## 2014-01-05 DIAGNOSIS — D696 Thrombocytopenia, unspecified: Secondary | ICD-10-CM

## 2014-01-05 DIAGNOSIS — Z7189 Other specified counseling: Secondary | ICD-10-CM

## 2014-01-05 DIAGNOSIS — R7401 Elevation of levels of liver transaminase levels: Secondary | ICD-10-CM

## 2014-01-05 DIAGNOSIS — Z716 Tobacco abuse counseling: Secondary | ICD-10-CM

## 2014-01-05 DIAGNOSIS — R7402 Elevation of levels of lactic acid dehydrogenase (LDH): Secondary | ICD-10-CM

## 2014-01-05 DIAGNOSIS — F17200 Nicotine dependence, unspecified, uncomplicated: Secondary | ICD-10-CM

## 2014-01-05 DIAGNOSIS — IMO0002 Reserved for concepts with insufficient information to code with codable children: Secondary | ICD-10-CM | POA: Insufficient documentation

## 2014-01-05 DIAGNOSIS — E559 Vitamin D deficiency, unspecified: Secondary | ICD-10-CM

## 2014-01-05 DIAGNOSIS — R740 Nonspecific elevation of levels of transaminase and lactic acid dehydrogenase [LDH]: Secondary | ICD-10-CM

## 2014-01-05 DIAGNOSIS — I25810 Atherosclerosis of coronary artery bypass graft(s) without angina pectoris: Secondary | ICD-10-CM

## 2014-01-05 DIAGNOSIS — Z23 Encounter for immunization: Secondary | ICD-10-CM

## 2014-01-05 DIAGNOSIS — E1165 Type 2 diabetes mellitus with hyperglycemia: Secondary | ICD-10-CM | POA: Insufficient documentation

## 2014-01-05 LAB — CBC WITH DIFFERENTIAL/PLATELET
Basophils Absolute: 0 10*3/uL (ref 0.0–0.1)
Basophils Relative: 0.5 % (ref 0.0–3.0)
Eosinophils Absolute: 0.2 10*3/uL (ref 0.0–0.7)
Eosinophils Relative: 3.3 % (ref 0.0–5.0)
HCT: 44.2 % (ref 39.0–52.0)
Hemoglobin: 14.2 g/dL (ref 13.0–17.0)
Lymphocytes Relative: 43.2 % (ref 12.0–46.0)
Lymphs Abs: 2.8 10*3/uL (ref 0.7–4.0)
MCHC: 32.2 g/dL (ref 30.0–36.0)
MCV: 85.6 fl (ref 78.0–100.0)
Monocytes Absolute: 0.6 10*3/uL (ref 0.1–1.0)
Monocytes Relative: 9.8 % (ref 3.0–12.0)
Neutro Abs: 2.8 10*3/uL (ref 1.4–7.7)
Neutrophils Relative %: 43.2 % (ref 43.0–77.0)
Platelets: 122 10*3/uL — ABNORMAL LOW (ref 150.0–400.0)
RBC: 5.17 Mil/uL (ref 4.22–5.81)
RDW: 13.4 % (ref 11.5–15.5)
WBC: 6.6 10*3/uL (ref 4.0–10.5)

## 2014-01-05 LAB — MICROALBUMIN / CREATININE URINE RATIO
Creatinine,U: 80.2 mg/dL
Microalb Creat Ratio: 1.2 mg/g (ref 0.0–30.0)
Microalb, Ur: 1 mg/dL (ref 0.0–1.9)

## 2014-01-05 LAB — ALT: ALT: 88 U/L — ABNORMAL HIGH (ref 0–53)

## 2014-01-05 LAB — HEMOGLOBIN A1C: HEMOGLOBIN A1C: 12.6 % — AB (ref 4.6–6.5)

## 2014-01-05 LAB — VITAMIN D 25 HYDROXY (VIT D DEFICIENCY, FRACTURES): VITD: 24.03 ng/mL — AB (ref 30.00–100.00)

## 2014-01-05 LAB — AST: AST: 52 U/L — ABNORMAL HIGH (ref 0–37)

## 2014-01-05 MED ORDER — AZITHROMYCIN 250 MG PO TABS: ORAL_TABLET | ORAL | Status: DC

## 2014-01-05 NOTE — Progress Notes (Signed)
Pre visit review using our clinic review tool, if applicable. No additional management support is needed unless otherwise documented below in the visit note. 

## 2014-01-05 NOTE — Progress Notes (Signed)
Patient ID: Kyle [name], male   DOB: 1961/02/28, 53 y.o.   MRN: [id number]   Subjective:    Patient ID: Kyle [name], male    DOB: 14-Sep-1960, 53 y.o.   MRN: [id number]  HPI  1. Diabetes Reports taking his medications as prescribed. No reported low blood glucose levels. No side effects from meds.  2. Low platelet count Has had low platelet count in the past. It had been improving. Follow up today.  3. Transaminitis Elevated ALT and AST. They had normalized somewhat. Need to follow up on these labs today.  4. Vitamin D deficiency Has not been taking any supplements.   5. Tobacco abuse He is smoking 1-3 cigarettes daily. He usually smokes when he has a cup of coffee.    Past Medical History  Diagnosis Date  . Asthma   . Diabetes mellitus without complication   . COPD (chronic obstructive pulmonary disease)   . Fainting spell   . GERD (gastroesophageal reflux disease)   . Allergy   . Hyperlipidemia   . Hypertension   . Urine incontinence   . CAD (coronary artery disease)   . 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 Chronic back pain 1981  . DDD (degenerative disc disease)     Current Outpatient Prescriptions on File Prior to Visit  Medication Sig Dispense Refill  . albuterol (PROVENTIL HFA;VENTOLIN HFA) 108 (90 BASE) MCG/ACT inhaler Inhale 2 puffs into the lungs as needed.       Marland Kitchen aspirin 325 MG tablet Take 1 tablet (325 mg total) by mouth daily.  30 tablet  5  . gabapentin (NEURONTIN) 800 MG tablet Take 800 mg by mouth 3 (three) times daily.       Marland Kitchen glucose blood test strip 1 each by Other route as needed for other. Precision xtra test strip      . HYDROcodone-acetaminophen (NORCO/VICODIN) 5-325 MG per tablet Take 1 tablet by mouth every 8 (eight) hours as needed.  90 tablet  0  . hydrOXYzine (ATARAX/VISTARIL) 25 MG tablet Take 25 mg by mouth every 8 (eight) hours as needed for itching.      . insulin NPH-regular Human (NOVOLIN 70/30) (70-30) 100 UNIT/ML injection 30  units in the AM and 20 units with evening meal      . Insulin Syringes, Disposable, U-100 1 ML MISC To inject 70/30 bid  100 each  6  . LANCETS ULTRA FINE MISC To check blood sugar four times daily  100 each  6  . metFORMIN (GLUCOPHAGE) 1000 MG tablet Take 1 tablet (1,000 mg total) by mouth 2 (two) times daily with a meal.  60 tablet  5  . methocarbamol (ROBAXIN) 750 MG tablet Take 1 tablet (750 mg total) by mouth 3 (three) times daily as needed for muscle spasms.  45 tablet  1  . metoprolol tartrate (LOPRESSOR) 25 MG tablet Take 1.5 tablets (37.5 mg total) by mouth 2 (two) times daily.  135 tablet  3  . omeprazole (PRILOSEC) 20 MG capsule Take 1 capsule (20 mg total) by mouth daily.  30 capsule  3  . sildenafil (VIAGRA) 100 MG tablet Take 100 mg by mouth daily as needed for erectile dysfunction.      . simvastatin (ZOCOR) 40 MG tablet Take 1 tablet (40 mg total) by mouth at bedtime.  90 tablet  3   No current facility-administered medications on file prior to visit.     Review of Systems  Constitutional:  Negative.  Negative for fever and chills.  HENT: Positive for congestion, postnasal drip and sinus pressure. Negative for rhinorrhea and sore throat.   Eyes: Negative.   Respiratory: Negative.  Negative for cough, shortness of breath and wheezing.   Cardiovascular: Negative.   Gastrointestinal: Negative.   Endocrine: Negative for polydipsia, polyphagia and polyuria.  Genitourinary: Negative.   Musculoskeletal: Negative.   Skin: Negative.   Neurological: Negative.   Psychiatric/Behavioral: Negative.   All other systems reviewed and are negative.      Objective:  BP 132/90  Pulse 100  Temp(Src) 98.1 F (36.7 C) (Oral)  Resp 14  Wt 178 lb 4 oz (80.854 kg)  SpO2 99%   Physical Exam  Constitutional: He is oriented to person, place, and time. He appears well-developed and well-nourished. No distress.  HENT:  Head: Normocephalic and atraumatic.  Mouth/Throat: No oropharyngeal  exudate.  Eyes: Conjunctivae and EOM are normal.  Neck: Normal range of motion. Neck supple.  Cardiovascular: Normal rate, regular rhythm, normal heart sounds and intact distal pulses.  Exam reveals no gallop and no friction rub.   No murmur heard. Pulmonary/Chest: Effort normal and breath sounds normal. No respiratory distress. He has no wheezes. He has no rales.  Musculoskeletal: Normal range of motion.  Lymphadenopathy:    He has no cervical adenopathy.  Neurological: He is alert and oriented to person, place, and time.  Skin: Skin is warm and dry.  Psychiatric: He has a normal mood and affect. His behavior is normal. Judgment and thought content normal.      Assessment & Plan:   1. Diabetes mellitus type 2, uncontrolled Check labs. Continue meds and make adjustments as necessary. Follow - Hemoglobin A1c; Future - Urine Microalbumin w/creat. ratio - Ambulatory referral to Ophthalmology - Hemoglobin A1c  2. Temporary low platelet count Check platelet count. Follow - CBC with Differential  3. Transaminitis Check liver enzymes. Follow - AST - ALT  4. Vitamin D deficiency Check vitamin D level.  - Vitamin D (25 hydroxy)  5. Tobacco abuse counseling Recommend that he completely stop smoking. He has had trouble given it up completely as he enjoys this with his coffee. Strongly suggested that he quit smoking.

## 2014-01-05 NOTE — Patient Instructions (Addendum)
  I am referring you to have your diabetic eye exam. Please see Hoyle Sauer prior to leaving the office to set up appointment.  You received your Flu and Pneumonia vaccine today.  Please have labs drawn prior to leaving the office.  Please schedule a 6 month follow up appointment prior to leaving the office today.  Recommend that you completely stop smoking.  I have sent in a prescription for Azithromycin for your sinuses. Take as directed.  Also use saline spray to help irrigate your sinuses. You may use this spray as often as you like. This is sold over the counter.

## 2014-01-06 ENCOUNTER — Other Ambulatory Visit: Payer: Self-pay | Admitting: Adult Health

## 2014-01-06 DIAGNOSIS — R7401 Elevation of levels of liver transaminase levels: Secondary | ICD-10-CM

## 2014-01-06 DIAGNOSIS — R740 Nonspecific elevation of levels of transaminase and lactic acid dehydrogenase [LDH]: Principal | ICD-10-CM

## 2014-01-06 NOTE — Progress Notes (Signed)
I have ordered additional tests for his elevated liver enzymes. He needs an ultrasound and additional labs. Please have him come in to have these done.

## 2014-01-07 NOTE — Progress Notes (Signed)
Lab appt scheduled 01/11/14 at 11:45am.

## 2014-01-10 ENCOUNTER — Ambulatory Visit: Payer: Self-pay | Admitting: Pain Medicine

## 2014-01-11 ENCOUNTER — Other Ambulatory Visit (INDEPENDENT_AMBULATORY_CARE_PROVIDER_SITE_OTHER): Payer: Medicare Other

## 2014-01-11 ENCOUNTER — Ambulatory Visit: Payer: Self-pay | Admitting: Pain Medicine

## 2014-01-11 DIAGNOSIS — R7401 Elevation of levels of liver transaminase levels: Secondary | ICD-10-CM

## 2014-01-11 DIAGNOSIS — R7402 Elevation of levels of lactic acid dehydrogenase (LDH): Secondary | ICD-10-CM

## 2014-01-11 DIAGNOSIS — R740 Nonspecific elevation of levels of transaminase and lactic acid dehydrogenase [LDH]: Principal | ICD-10-CM

## 2014-01-11 LAB — BASIC METABOLIC PANEL
Anion Gap: 8 (ref 7–16)
BUN: 7 mg/dL (ref 7–18)
CALCIUM: 9.2 mg/dL (ref 8.5–10.1)
CO2: 26 mmol/L (ref 21–32)
Chloride: 102 mmol/L (ref 98–107)
Creatinine: 0.86 mg/dL (ref 0.60–1.30)
EGFR (African American): >60
GLUCOSE: 339 mg/dL — AB (ref 65–99)
Osmolality: 283 (ref 275–301)
POTASSIUM: 5 mmol/L (ref 3.5–5.1)
SODIUM: 136 mmol/L (ref 136–145)

## 2014-01-11 LAB — MAGNESIUM: Magnesium: 2 mg/dL

## 2014-01-11 LAB — HEPATIC FUNCTION PANEL
ALK PHOS: 57 U/L (ref 39–117)
ALT: 59 U/L — ABNORMAL HIGH (ref 0–53)
AST: 33 U/L (ref 0–37)
Albumin: 3.8 g/dL (ref 3.5–5.2)
BILIRUBIN DIRECT: 0.1 mg/dL (ref 0.0–0.3)
Total Bilirubin: 0.5 mg/dL (ref 0.2–1.2)
Total Protein: 7 g/dL (ref 6.0–8.3)

## 2014-01-11 LAB — HEPATIC FUNCTION PANEL A (ARMC)
ALK PHOS: 66 U/L
Albumin: 3.6 g/dL (ref 3.4–5.0)
Bilirubin, Direct: 0.2 mg/dL (ref 0.00–0.20)
Bilirubin,Total: 0.4 mg/dL (ref 0.2–1.0)
SGOT(AST): 28 U/L (ref 15–37)
SGPT (ALT): 69 U/L — ABNORMAL HIGH
TOTAL PROTEIN: 7.5 g/dL (ref 6.4–8.2)

## 2014-01-11 LAB — SEDIMENTATION RATE: Erythrocyte Sed Rate: 2 mm/hr (ref 0–20)

## 2014-01-11 LAB — IRON: IRON: 130 ug/dL (ref 42–165)

## 2014-01-11 LAB — FERRITIN: Ferritin: 124.4 ng/mL (ref 22.0–322.0)

## 2014-01-12 ENCOUNTER — Other Ambulatory Visit: Payer: Self-pay | Admitting: Adult Health

## 2014-01-12 DIAGNOSIS — R768 Other specified abnormal immunological findings in serum: Secondary | ICD-10-CM

## 2014-01-12 LAB — IRON AND TIBC
%SAT: 27 % (ref 20–55)
IRON: 133 ug/dL (ref 42–165)
TIBC: 486 ug/dL — ABNORMAL HIGH (ref 215–435)
UIBC: 353 ug/dL (ref 125–400)

## 2014-01-12 LAB — HEPATITIS C ANTIBODY: HCV AB: REACTIVE — AB

## 2014-01-12 LAB — HEPATITIS B SURFACE ANTIGEN: Hepatitis B Surface Ag: NEGATIVE

## 2014-01-12 LAB — ANTI-SMITH ANTIBODY: ENA SM AB SER-ACNC: NEGATIVE

## 2014-01-12 LAB — ANA: ANA: NEGATIVE

## 2014-01-12 LAB — HEPATITIS B CORE ANTIBODY, TOTAL: Hep B Core Total Ab: NONREACTIVE

## 2014-01-12 LAB — HEPATITIS A ANTIBODY, IGM: Hep A IgM: NONREACTIVE

## 2014-01-12 LAB — HEPATITIS B SURFACE ANTIBODY,QUALITATIVE: Hep B S Ab: POSITIVE — AB

## 2014-01-12 NOTE — Progress Notes (Signed)
Hepatitis C antibodies present. I am referring him to [facility].

## 2014-01-13 ENCOUNTER — Ambulatory Visit: Payer: Self-pay | Admitting: Adult Health

## 2014-01-20 ENCOUNTER — Telehealth: Payer: Self-pay | Admitting: Internal Medicine

## 2014-01-20 NOTE — Telephone Encounter (Signed)
Sent mychart message with results

## 2014-01-20 NOTE — Telephone Encounter (Signed)
Ultrasound of the abdomen was normal. We should set up a follow up visit for him to establish care with one of the providers 63min

## 2014-03-10 ENCOUNTER — Encounter: Payer: Self-pay | Admitting: Adult Health

## 2014-03-14 ENCOUNTER — Telehealth: Payer: Self-pay | Admitting: *Deleted

## 2014-03-14 NOTE — Telephone Encounter (Signed)
Pt called requesting refill on Hydrocodone.  Pt had initial appoint with Dr Oda Kilts at [facility] on 9.14.15.  He was referred to Dr Jacinto Reap for psych evaluation by Dr Oda Kilts.  Pt never complied.  I requested pt call [facility] for status. Pt states he was a pt of Raquel's and she was prescribing Hydrocodone.  He is wanting a refill.   Please advise

## 2014-03-14 NOTE — Telephone Encounter (Signed)
Refill on hydrocodone denied.  Once patinet is referred to [facility] Management for narcotics,  We do not refill.  Not negotiable

## 2014-03-14 NOTE — Telephone Encounter (Signed)
Patient called the office again requesting Rx for hydrocodone. Informed patient of Dr. Lupita Dawn comments. Patient kept asking "what am I suppose to do" I repeatedly told patient to call pain management. Patient did not follow care plan from pain management so I believe he was discharged. Patient is not telling me why he can not be since at pain management. He keeps stating that Dr. Jacinto Reap isn't taking new patients until Dec. I told him that he would not be a new patient because he has already est. Care. Please advise if there is anything else I should tell the patient.

## 2014-03-16 ENCOUNTER — Telehealth: Payer: Self-pay | Admitting: Internal Medicine

## 2014-03-16 NOTE — Telephone Encounter (Signed)
Pt called again requesting pain medication.  Pt was advised once again that once he was referred to pain management all future pain medication refills ceased.  Pt states he is unable to see pain management until December.  Pt advised to call pain management to see if he could be put on a cancellation list for an earlier appoint.  Pt states he doesn't understand why he has to suffer.  I advised him that the missing part of treatment is the referral to the psychiatrist.  Pt got frustrated and hung up

## 2014-03-19 ENCOUNTER — Emergency Department: Payer: Self-pay | Admitting: Emergency Medicine

## 2014-04-05 ENCOUNTER — Ambulatory Visit (INDEPENDENT_AMBULATORY_CARE_PROVIDER_SITE_OTHER): Payer: Medicare Other | Admitting: Nurse Practitioner

## 2014-04-05 ENCOUNTER — Encounter: Payer: Self-pay | Admitting: Nurse Practitioner

## 2014-04-05 VITALS — BP 118/78 | HR 80 | Temp 98.1°F | Resp 15 | Ht 67.0 in | Wt 175.8 lb

## 2014-04-05 DIAGNOSIS — M549 Dorsalgia, unspecified: Secondary | ICD-10-CM

## 2014-04-05 DIAGNOSIS — Z716 Tobacco abuse counseling: Secondary | ICD-10-CM

## 2014-04-05 DIAGNOSIS — IMO0002 Reserved for concepts with insufficient information to code with codable children: Secondary | ICD-10-CM

## 2014-04-05 DIAGNOSIS — I25810 Atherosclerosis of coronary artery bypass graft(s) without angina pectoris: Secondary | ICD-10-CM

## 2014-04-05 DIAGNOSIS — E1165 Type 2 diabetes mellitus with hyperglycemia: Secondary | ICD-10-CM

## 2014-04-05 DIAGNOSIS — L84 Corns and callosities: Secondary | ICD-10-CM

## 2014-04-05 NOTE — Assessment & Plan Note (Signed)
Patient is being seen at the end of this month with the pain clinic (unsure exact one). Discussed importance of keeping appointment. Pt asked about a Rx for pain medication, specifically Hydrocodone.  Discussed that I am unable to provide chronic pain management and narcotics can only be obtained through the clinic. He is allergic to other medications and is already on Gabapentin. Discussed that he may need to follow up with surgeon at the [facility] that he saw previously.

## 2014-04-05 NOTE — Progress Notes (Signed)
Pre visit review using our clinic review tool, if applicable. No additional management support is needed unless otherwise documented below in the visit note. 

## 2014-04-05 NOTE — Assessment & Plan Note (Signed)
Calluses still apparent on feet. No wounds noted.

## 2014-04-05 NOTE — Patient Instructions (Addendum)
Please use the new glucometer to check your blood sugars.  Please keep appointment with pain management  Please make appointment with GI doctor that you were referred to.  Labs are in to check your Hgb A1c and Microalbumin. Please make lab appointment at check out.

## 2014-04-05 NOTE — Assessment & Plan Note (Signed)
Patient is interested in stopping smoking completely. He is down to 1 cigarette every 2 days he reports.

## 2014-04-05 NOTE — Progress Notes (Signed)
Subjective:    Patient ID: Kyle [name], male    DOB: August 01, 1960, 53 y.o.   MRN: [id number]  HPI Kyle [name] is a 53 yo male here for follow up (not due for follow up for 3 more months). Patient in past has been very non-compliant with referrals and with the pain management clinic. He is also a poor historian.   1)Tobacco- Patient states he has 1 cigarette every two days. Encouraged patient that this is great news and he shouldn't have any problem stopping shortly.   2) Blood sugars- Need new machine. Has not been able to check in 2 weeks. Checks feet every day, discoloration on left foot x2 weeks.   3)  Finished Z-pack. Left nostril sore x 2 days.   4) Pain in neck and back.   Gabapentin helpful   Phyiscal Therapy in past at [place].  Pain management clinic visit at the end of this month.  [facility] does not want surgery at this time.  Pain in neck- can't turn head due to shooting pain down left side (Pt says shooting pains down anterior shoulder and into top of chest)   5) Eye exam- unsure- (I believe he did not respond to referral from what he is saying to me, No straight answers)    Review of Systems  Constitutional: Negative for fever, chills, diaphoresis and fatigue.  Eyes: Positive for visual disturbance.       Blurry vision when looking to the side. (Did not follow up with ophalmology referral).   Gastrointestinal: Positive for diarrhea and anal bleeding. Negative for nausea, vomiting and blood in stool.       Spot of blood when wiping. Happened x 1. (Did not follow through with GI referral).   Musculoskeletal: Positive for back pain and neck pain. Negative for arthralgias.  Skin: Positive for color change.       Left foot- (showed me superficial blood vessels)  Neurological: Positive for weakness. Negative for light-headedness, numbness and headaches.       Pt states he feels drained lately  Psychiatric/Behavioral: The patient is not  nervous/anxious.    Past Medical History  Diagnosis Date  . Asthma   . Diabetes mellitus without complication   . COPD (chronic obstructive pulmonary disease)   . Fainting spell   . GERD (gastroesophageal reflux disease)   . Allergy   . Hyperlipidemia   . Hypertension   . Urine incontinence   . CAD (coronary artery disease)   . 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 Chronic back pain 1981  . DDD (degenerative disc disease)     History   Social History  . Marital Status: Single    Spouse Name: N/A    Number of Children: N/A  . Years of Education: 14   Occupational History  . 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 Smoking status: Current Every Day Smoker -- 35 years    Types: Cigarettes    Last Attempt to Quit: 11/24/2012  . Smokeless tobacco: Never Used  . Alcohol Use: Yes     Comment: occasional  . Drug Use: No  . Sexual Activity: Not on file   Other Topics Concern  . Not on file   Social History Narrative   Kyle [name] grew up in the [place] area. He was in the [employer] for 4 years. 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 He is currently disabled. Kyle [name] lives by himself but has excellent support system. He is very close to his family. He is currently taking online courses through [employer]. He is trying to obtain a degree in social services and ministry. His goal is to work with at-risk young adults.      Regular exercise: not recently   Caffeine use: occasionally    Past Surgical History  Procedure Laterality Date  . 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 Cardiac catheterization  11/2012    [facility]    Family History  Problem Relation Age of Onset  . Heart disease Mother   . Heart disease Father   . Hypertension Father   . Diabetes Sister   . Cancer Sister     skin cancer    Allergies  Allergen Reactions  .  Amoxicillin   . Flexeril [Cyclobenzaprine]     rash  . Methadone   . Tramadol   . Trazodone And Nefazodone     Current Outpatient Prescriptions on File Prior to Visit  Medication Sig Dispense Refill  . albuterol (PROVENTIL HFA;VENTOLIN HFA) 108 (90 BASE) MCG/ACT inhaler Inhale 2 puffs into the lungs as needed.     Marland Kitchen aspirin 325 MG tablet Take 1 tablet (325 mg total) by mouth daily. 30 tablet 5  . gabapentin (NEURONTIN) 800 MG tablet Take 800 mg by mouth 3 (three) times daily.     Marland Kitchen glucose blood test strip 1 each by Other route as needed for other. Precision xtra test strip    . HYDROcodone-acetaminophen (NORCO/VICODIN) 5-325 MG per tablet Take 1 tablet by mouth every 8 (eight) hours as needed. 90 tablet 0  . hydrOXYzine (ATARAX/VISTARIL) 25 MG tablet Take 25 mg by mouth every 8 (eight) hours as needed for itching.    . insulin NPH-regular Human (NOVOLIN 70/30) (70-30) 100 UNIT/ML injection 30 units in the AM and 20 units with evening meal    . Insulin Syringes, Disposable, U-100 1 ML MISC To inject 70/30 bid 100 each 6  . LANCETS ULTRA FINE MISC To check blood sugar four times daily 100 each 6  . metFORMIN (GLUCOPHAGE) 1000 MG tablet Take 1 tablet (1,000 mg total) by mouth 2 (two) times daily with a meal. 60 tablet 5  . methocarbamol (ROBAXIN) 750 MG tablet Take 1 tablet (750 mg total) by mouth 3 (three) times daily as needed for muscle spasms. 45 tablet 1  . metoprolol tartrate (LOPRESSOR) 25 MG tablet Take 1.5 tablets (37.5 mg total) by mouth 2 (two) times daily. 135 tablet 3  . omeprazole (PRILOSEC) 20 MG capsule Take 1 capsule (20 mg total) by mouth daily. 30 capsule 3  . sildenafil (VIAGRA) 100 MG tablet Take 100 mg by mouth daily as needed for erectile dysfunction.    . simvastatin (ZOCOR) 40 MG tablet Take 1 tablet (40 mg total) by mouth at bedtime. 90 tablet 3   No current facility-administered medications on file prior to visit.         Objective:   Physical Exam    Constitutional: He is oriented to person, place, and time. He appears well-nourished. No distress.  HENT:  Head: Normocephalic and atraumatic.  Cardiovascular: Normal rate and regular rhythm.   Pulmonary/Chest: Effort normal and breath sounds normal. No respiratory distress. He has no wheezes. He has no rales. He exhibits  no tenderness.  Abdominal: Soft. Bowel sounds are normal. He exhibits no distension and no mass. There is no tenderness. There is no rebound and no guarding.  Musculoskeletal: He exhibits no edema or tenderness.  Neurological: He is alert and oriented to person, place, and time. He displays normal reflexes. No cranial nerve deficit. He exhibits normal muscle tone. Coordination normal.  Gait observed and was without abnormalities.   Skin: Skin is dry. No rash noted. He is not diaphoretic. No erythema.  Feet are dry  Psychiatric: He has a normal mood and affect.  Patient has a very non-compliant hx. He is asking for hydrocodone today (being seen at the pain mgmt clinic). His mood is not out of normal limits and judgement seems to be impaired, due to making poor decisions regarding his health.     BP 118/78 mmHg  Pulse 80  Temp(Src) 98.1 F (36.7 C) (Oral)  Resp 15  Ht 5\' 7"  (1.702 m)  Wt 175 lb 12 oz (79.72 kg)  BMI 27.52 kg/m2  SpO2 98%     Assessment & Plan:

## 2014-04-05 NOTE — Assessment & Plan Note (Signed)
Uncontrolled- unable to take blood sugars at home due to broken monitor. New one provided to patient today. He is non-compliant on referrals, I am afraid to refer to endocrine.  Obtain A1c (future order since 90 day mark is tomorrow) and microalbumin.   Discussed importance of following through on referrals in order to have help with medical problems.

## 2014-04-06 ENCOUNTER — Telehealth: Payer: Self-pay | Admitting: Nurse Practitioner

## 2014-04-06 NOTE — Telephone Encounter (Signed)
emmi emailed °

## 2014-04-11 ENCOUNTER — Other Ambulatory Visit (INDEPENDENT_AMBULATORY_CARE_PROVIDER_SITE_OTHER): Payer: Medicare Other

## 2014-04-11 DIAGNOSIS — E1165 Type 2 diabetes mellitus with hyperglycemia: Secondary | ICD-10-CM

## 2014-04-11 DIAGNOSIS — IMO0002 Reserved for concepts with insufficient information to code with codable children: Secondary | ICD-10-CM

## 2014-04-11 LAB — MICROALBUMIN / CREATININE URINE RATIO
Creatinine,U: 37 mg/dL
MICROALB UR: 0.3 mg/dL (ref 0.0–1.9)
Microalb Creat Ratio: 0.8 mg/g (ref 0.0–30.0)

## 2014-04-11 LAB — HEMOGLOBIN A1C: HEMOGLOBIN A1C: 13 % — AB (ref 4.6–6.5)

## 2014-04-13 ENCOUNTER — Telehealth: Payer: Self-pay

## 2014-04-13 DIAGNOSIS — IMO0002 Reserved for concepts with insufficient information to code with codable children: Secondary | ICD-10-CM

## 2014-04-13 DIAGNOSIS — E1165 Type 2 diabetes mellitus with hyperglycemia: Secondary | ICD-10-CM

## 2014-04-13 NOTE — Telephone Encounter (Signed)
Kyle [name] is aware, will review ED records and will complete letter when not seeing patients.

## 2014-04-13 NOTE — Telephone Encounter (Signed)
The patient called and is hoping to get a note that will allow him to return to work.

## 2014-04-13 NOTE — Addendum Note (Signed)
Addended by: Rubbie Battiest on: 04/13/2014 03:10 PM   Modules accepted: Orders

## 2014-04-13 NOTE — Telephone Encounter (Signed)
Pt notified and  verbalized understanding. Appt for letter scheduled 04/18/14. Appt with Dr. Howell Rucks scheduled 05/11/14

## 2014-04-13 NOTE — Telephone Encounter (Signed)
Please call patient and inform him that the emergency department records do not state that work caused his bronchitis. I saw him previously for HTN and Diabetes Follow up. He will need to make a separate appointment to follow up on other issues if he would like a letter for his job.   Also, his A1c is still high. He has been referred to Dr. Howell Rucks for endocrinology.

## 2014-04-15 ENCOUNTER — Encounter: Payer: Self-pay | Admitting: Nurse Practitioner

## 2014-04-18 ENCOUNTER — Ambulatory Visit: Payer: Medicare Other | Admitting: Nurse Practitioner

## 2014-05-11 ENCOUNTER — Ambulatory Visit: Payer: Medicare Other | Admitting: Endocrinology

## 2014-05-12 ENCOUNTER — Ambulatory Visit: Payer: Medicare Other | Admitting: Endocrinology

## 2014-05-15 ENCOUNTER — Emergency Department: Payer: Self-pay | Admitting: Emergency Medicine

## 2014-05-17 ENCOUNTER — Ambulatory Visit (INDEPENDENT_AMBULATORY_CARE_PROVIDER_SITE_OTHER): Payer: Medicare Other | Admitting: Endocrinology

## 2014-05-17 ENCOUNTER — Encounter: Payer: Self-pay | Admitting: Endocrinology

## 2014-05-17 VITALS — BP 124/76 | HR 91 | Resp 14 | Wt 174.5 lb

## 2014-05-17 DIAGNOSIS — IMO0002 Reserved for concepts with insufficient information to code with codable children: Secondary | ICD-10-CM

## 2014-05-17 DIAGNOSIS — E1165 Type 2 diabetes mellitus with hyperglycemia: Secondary | ICD-10-CM

## 2014-05-17 DIAGNOSIS — R739 Hyperglycemia, unspecified: Secondary | ICD-10-CM | POA: Diagnosis not present

## 2014-05-17 DIAGNOSIS — E785 Hyperlipidemia, unspecified: Secondary | ICD-10-CM

## 2014-05-17 DIAGNOSIS — I10 Essential (primary) hypertension: Secondary | ICD-10-CM

## 2014-05-17 DIAGNOSIS — L84 Corns and callosities: Secondary | ICD-10-CM

## 2014-05-17 LAB — GLUCOSE, POCT (MANUAL RESULT ENTRY): POC Glucose: 439 mg/dl — AB (ref 70–99)

## 2014-05-17 LAB — HM DIABETES FOOT EXAM: HM DIABETIC FOOT EXAM: NORMAL

## 2014-05-17 MED ORDER — ACCU-CHEK SOFT TOUCH LANCETS MISC: Status: DC

## 2014-05-17 NOTE — Assessment & Plan Note (Addendum)
BP at target today. Recent urine MA negative.

## 2014-05-17 NOTE — Assessment & Plan Note (Signed)
Last lipids 10/2013. Taking statin regularly per his report. LDL at target. Expect improvement in TG levels with better sugars.

## 2014-05-17 NOTE — Assessment & Plan Note (Signed)
Discussed appropriate foot care in diabetic patients.

## 2014-05-17 NOTE — Progress Notes (Signed)
Pre visit review using our clinic review tool, if applicable. No additional management support is needed unless otherwise documented below in the visit note. 

## 2014-05-17 NOTE — Patient Instructions (Addendum)
Check sugars 2 x daily ( before breakfast and before supper).  Record them in a log book and bring that/meter to next appointment.   Continue current Novolin 70/30 at 30 units with morning meal and 20 units with evening. Dont miss insulin doses. Notify me if start to have lows.   Start taking metformin like you should twice daily.   Avoid alcohol intake.  Talk to your PCP about pain management instead.  Drink lots of water daily ( atleast 6 glasses daily)  Please come back for a follow-up appointment in 2 weeks

## 2014-05-17 NOTE — Assessment & Plan Note (Signed)
Discussed at length regarding diabetes and long term risks and complications. Discussed about home glucose monitoring and need to check his sugars daily.  Discussed A1c and goal A1c. Discussed at length about medication compliance and regular medical follow up.   He has agreed to work with me to improve his sugars.  Agreed to check his sugars 2 x daily.  He will restart taking his 70/30 insulin at current doses and metformin at current doses and will report back his sugars for trend. His liver tests need to be monitored closely on metformin, and if they start rising further then we may have to take him off the metformin.  Bring meter/log to next appointment.  His sugar is elevated today, and I have asked him to take his morning dose of insulin and report back by evening time today re his sugars.   We also discussed about basal/bolus insulin , however feel that compliance is a big limitation at this point and hence will continue current regimen.   We discussed that alcohol is not a solution to his pain. I reminded him regarding his pain medications on his medication list and he reports being out of vicodin. I will discuss with his PCP regarding this - perhaps this will be best addressed at an appointment. Consider pain clinic referral. Also, he was asked to be careful with his weight lifting exercises as this might lead to further worsening of his pain.   RTC 2 weeks

## 2014-05-17 NOTE — Progress Notes (Signed)
Patient ID: Kyle [name], male   DOB: 03-27-1961, 54 y.o.   MRN: [id number]   Reason for visit-  Kyle [name] is a 54 y.o.-year-old male, referred by his PCP,  Doss, Velora Heckler, NP for management of Type 2 diabetes, uncontrolled, without complications. Associated problem of CAD.   HPI- Patient has been diagnosed with diabetes in 2014. Recalls being initially on lifestyle modifications.   Started on metformin shortly after. he has been insulin since about 2014.  Has been uncontrolled this year atleast due to non compliance with drug regimen  *Patient reports struggling with pain in his neck and back, and reports that he has been turning to alcohol recently for pain. 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 Prior had been sober for preceding 7 years Is also a smoker   Pt is currently on a regimen of: - Metformin 1000 mg po bid ( taking 2 times per week) - Novolin 70/30 at 30 units am and 20 units supper ( not taken in last 2 weeks)   Last hemoglobin A1c was: Lab Results  Component Value Date   HGBA1C 13.0* 04/11/2014   HGBA1C 12.6* 01/05/2014   HGBA1C 13.4* 10/01/2013     Pt checks his sugars  2 a day . Uses one touch ultra2 glucometer. By recall, they are-  Prior to this used old meter ( precision meter that he used >1 month ago):  PREMEAL Breakfast Lunch Dinner Bedtime Overall  Glucose range: 120-126- 200s   300-400   Mean/median:        POST-MEAL PC Breakfast PC Lunch PC Dinner  Glucose range:     Mean/median:      Last hospital stay 1 year ago for hyperglycemia. Today FS very elevated 400+, denies systemic symptoms  Hypoglycemia-  No lows. Lowest sugar was n/a; he has hypoglycemia awareness at 70.   Dietary habits- eats three times daily. Tries to limit carbs. Loves bread. White bread. Drinking alcohol.  Exercise- walks/lifts weight every other day Weight - decreasing with symptoms of hyperglycemia Wt Readings from Last 3 Encounters:   05/17/14 174 lb 8 oz (79.153 kg)  04/05/14 175 lb 12 oz (79.72 kg)  01/05/14 178 lb 4 oz (80.854 kg)    Diabetes Complications-  Nephropathy- No  CKD, last BUN/creatinine-  Lab Results  Component Value Date   BUN 9 10/01/2013   CREATININE 0.7 10/01/2013   Lab Results  Component Value Date   GFR 164.14 10/01/2013   Lab Results  Component Value Date   MICRALBCREAT 0.8 04/11/2014    Retinopathy- No, Last DEE was in 2015 ( 8-9 months ago per pt) Neuropathy- no numbness and tingling in his feet. No known neuropathy. Is on neurontin for back pain Associated history - hasCAD . No prior stroke. No hypothyroidism. his last TSH was  Lab Results  Component Value Date   TSH 1.34 02/24/2013    Hyperlipidemia-  his last set of lipids were- Currently on zocor 40 mg daily. Tolerating well.   Lab Results  Component Value Date   CHOL 135 11/24/2013   HDL 34.50* 11/24/2013   LDLDIRECT 74.1 11/24/2013   TRIG 238.0* 11/24/2013   CHOLHDL 4 11/24/2013   Lab Results  Component Value Date   AST 33 01/11/2014   AST 52* 01/05/2014   ALT 59* 01/11/2014   ALT 88* 01/05/2014    Blood Pressure/HTN- Patient's blood pressure is well controlled today.  Pt has FH of DM in aunts,  uncles.  I have reviewed the patient's past medical history, family and social history, surgical history, medications and allergies.  Past Medical History  Diagnosis Date  . Asthma   . Diabetes mellitus without complication   . COPD (chronic obstructive pulmonary disease)   . Fainting spell   . GERD (gastroesophageal reflux disease)   . Allergy   . Hyperlipidemia   . Hypertension   . Urine incontinence   . CAD (coronary artery disease)   . 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 Chronic back pain 1981  . DDD (degenerative disc disease)    Past Surgical History  Procedure Laterality Date  . 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 Cardiac catheterization  11/2012    [facility]   Family  History  Problem Relation Age of Onset  . Heart disease Mother   . Heart disease Father   . Hypertension Father   . Diabetes Sister   . Cancer Sister     skin cancer  . Diabetes Other   . Heart disease Other    History   Social History  . Marital Status: Single    Spouse Name: N/A    Number of Children: N/A  . Years of Education: 14   Occupational History  . 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 Smoking status: Current Every Day Smoker -- 35 years    Types: Cigarettes    Last Attempt to Quit: 11/24/2012  . Smokeless tobacco: Never Used  . Alcohol Use: Yes     Comment: occasional  . Drug Use: No  . Sexual Activity: Not on file   Other Topics Concern  . Not on file   Social History Narrative   Kyle [name] grew up in the [place] area. He was in the [employer] for 4 years. 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 He is currently disabled. Kyle [name] lives by himself but has excellent support system. He is very close to his family. He is currently taking online courses through [employer]. He is trying to obtain a degree in social services and ministry. His goal is to work with at-risk young adults.      Regular exercise: not recently   Caffeine use: occasionally   Current Outpatient Prescriptions on File Prior to Visit  Medication Sig Dispense Refill  . albuterol (PROVENTIL HFA;VENTOLIN HFA) 108 (90 BASE) MCG/ACT inhaler Inhale 2 puffs into the lungs as needed.     Marland Kitchen aspirin 325 MG tablet Take 1 tablet (325 mg total) by mouth daily. 30 tablet 5  . gabapentin (NEURONTIN) 800 MG tablet Take 800 mg by mouth 3 (three) times daily.     Marland Kitchen glucose blood test strip 1 each by Other route as needed for other. Precision xtra test strip    . HYDROcodone-acetaminophen (NORCO/VICODIN) 5-325 MG per tablet Take 1 tablet by mouth every 8 (eight) hours as needed. 90 tablet 0  .  hydrOXYzine (ATARAX/VISTARIL) 25 MG tablet Take 25 mg by mouth every 8 (eight) hours as needed for itching.    . Insulin Syringes, Disposable, U-100 1 ML MISC To inject 70/30 bid 100 each 6  . LANCETS ULTRA FINE MISC To check blood sugar four times daily 100 each 6  . metFORMIN (GLUCOPHAGE) 1000 MG tablet Take 1 tablet (1,000 mg  total) by mouth 2 (two) times daily with a meal. 60 tablet 5  . methocarbamol (ROBAXIN) 750 MG tablet Take 1 tablet (750 mg total) by mouth 3 (three) times daily as needed for muscle spasms. 45 tablet 1  . metoprolol tartrate (LOPRESSOR) 25 MG tablet Take 1.5 tablets (37.5 mg total) by mouth 2 (two) times daily. 135 tablet 3  . omeprazole (PRILOSEC) 20 MG capsule Take 1 capsule (20 mg total) by mouth daily. 30 capsule 3  . sildenafil (VIAGRA) 100 MG tablet Take 100 mg by mouth daily as needed for erectile dysfunction.    . simvastatin (ZOCOR) 40 MG tablet Take 1 tablet (40 mg total) by mouth at bedtime. 90 tablet 3  . insulin NPH-regular Human (NOVOLIN 70/30) (70-30) 100 UNIT/ML injection 30 units in the AM and 20 units with evening meal     No current facility-administered medications on file prior to visit.   Allergies  Allergen Reactions  . Amoxicillin   . Flexeril [Cyclobenzaprine]     rash  . Methadone   . Tramadol   . Trazodone And Nefazodone      Review of Systems: [x]  complains of  [  ] denies General:   [  ] Recent weight change [ x ] Fatigue  [  ] Loss of appetite Eyes: [ x ]  Vision Difficulty [  ]  Eye pain ENT: [ x ]  Hearing difficulty [  ]  Difficulty Swallowing CVS: [  ] Chest pain [  ]  Palpitations/Irregular Heart beat [x  ]  Shortness of breath lying flat [  ] Swelling of legs Resp: [  ] Frequent Cough [ x ] Shortness of Breath  [ x ]  Wheezing GI: [ x ] Heartburn  [ x ] Nausea or Vomiting  [  ] Diarrhea [ x ] Constipation  [  ] Abdominal Pain GU: [ x ]  Polyuria  [x  ]  nocturia Bones/joints:  [ x ]  Muscle aches  [  ] Joint Pain  [  ] Bone  pain Skin/Hair/Nails: [  ]  Rash  [  ] New stretch marks [ x ]  Itching [  ] Hair loss [  ]  Excessive hair growth Reproduction: [ x ] Low sexual desire , [  ]  Women: Menstrual cycle problems [  ]  Women: Breast Discharge [ x ] Men: Difficulty with erections [  ]  Men: Enlarged Breasts CNS: [ x ] Frequent Headaches [ x ] Blurry vision [  ] Tremors [  ] Seizures [  ] Loss of consciousness [  ] Localized weakness Endocrine: [ x ]  Excess thirst [x  ]  Feeling excessively hot [ x ]  Feeling excessively cold Heme: [  ]  Easy bruising [  ]  Enlarged glands or lumps in neck Allergy: [  ]  Food allergies [  ] Environmental allergies  PE: BP 124/76 mmHg  Pulse 91  Resp 14  Wt 174 lb 8 oz (79.153 kg)  SpO2 97% Wt Readings from Last 3 Encounters:  05/17/14 174 lb 8 oz (79.153 kg)  04/05/14 175 lb 12 oz (79.72 kg)  01/05/14 178 lb 4 oz (80.854 kg)   GENERAL: No acute distress, well developed HEENT:  Eye exam shows normal external appearance. Oral exam shows normal mucosa .  NECK:   Neck exam shows no lymphadenopathy. No Carotids bruits. Thyroid is not enlarged and no nodules felt.  no  acanthosis nigricans LUNGS:         Chest is symmetrical. Lungs are clear to auscultation.Marland Kitchen   HEART:         Heart sounds:  S1 and S2 are normal. No murmurs or clicks heard. ABDOMEN:  No Distention present. Liver and spleen are not palpable. No other mass or tenderness present.  EXTREMITIES:     There is no edema. 2+ DP pulses  NEUROLOGICAL:     Grossly intact.            Diabetic foot exam done with shoes and socks removed: Normal Monofilament testing bilaterally. No deformities of toes.  Nails  Not dystrophic. Skin very dry and with calluses. No open wounds. Dry skin.  MUSCULOSKELETAL:       There is no enlargement or gross deformity of the joints.  SKIN:       No rash  ASSESSMENT AND PLAN: Problem List Items Addressed This Visit      Cardiovascular and Mediastinum   HTN (hypertension)    BP at target today.  Recent urine MA negative.         Musculoskeletal and Integument   Callus of foot    Discussed appropriate foot care in diabetic patients.        Other   Diabetes type 2, uncontrolled    Discussed at length regarding diabetes and long term risks and complications. Discussed about home glucose monitoring and need to check his sugars daily.  Discussed A1c and goal A1c. Discussed at length about medication compliance and regular medical follow up.   He has agreed to work with me to improve his sugars.  Agreed to check his sugars 2 x daily.  He will restart taking his 70/30 insulin at current doses and metformin at current doses and will report back his sugars for trend. His liver tests need to be monitored closely on metformin, and if they start rising further then we may have to take him off the metformin.  Bring meter/log to next appointment.  His sugar is elevated today, and I have asked him to take his morning dose of insulin and report back by evening time today re his sugars.   We also discussed about basal/bolus insulin , however feel that compliance is a big limitation at this point and hence will continue current regimen.   We discussed that alcohol is not a solution to his pain. I reminded him regarding his pain medications on his medication list and he reports being out of vicodin. I will discuss with his PCP regarding this - perhaps this will be best addressed at an appointment. Consider pain clinic referral. Also, he was asked to be careful with his weight lifting exercises as this might lead to further worsening of his pain.   RTC 2 weeks      Hyperlipidemia    Last lipids 10/2013. Taking statin regularly per his report. LDL at target. Expect improvement in TG levels with better sugars.       Diabetes mellitus type 2, uncontrolled    Other Visit Diagnoses    Hyperglycemia    -  Primary    Relevant Orders    POCT Glucose (CBG) (Completed)       - Return to clinic in  2 weeks with sugar log/meter.  Kyle [name] [facility] 05/17/2014 2:26 PM

## 2014-05-19 ENCOUNTER — Telehealth: Payer: Self-pay

## 2014-05-19 NOTE — Telephone Encounter (Signed)
Called patient x2 this morning. No answer. Left voicemail requesting a callback. Patient was aware that I would be calling to check on him today.

## 2014-05-19 NOTE — Telephone Encounter (Signed)
Patient called back and stated that he did check his sugar today at 7am fasting = 352. Patient stated that he did take his insulin today but has not checked his sugar since taking the insulin. Patient stated that he was not at home right now so he can't check his sugar at this time.

## 2014-05-19 NOTE — Telephone Encounter (Signed)
Good to know that it is coming down. Also, glad that he is taking his insulin again.

## 2014-05-19 NOTE — Telephone Encounter (Signed)
The patient called and stated his blood sugar at 2:15pm was 201

## 2014-06-07 ENCOUNTER — Ambulatory Visit (INDEPENDENT_AMBULATORY_CARE_PROVIDER_SITE_OTHER): Payer: Medicare Other | Admitting: Nurse Practitioner

## 2014-06-07 ENCOUNTER — Encounter: Payer: Self-pay | Admitting: Nurse Practitioner

## 2014-06-07 ENCOUNTER — Encounter: Payer: Self-pay | Admitting: Endocrinology

## 2014-06-07 ENCOUNTER — Ambulatory Visit (INDEPENDENT_AMBULATORY_CARE_PROVIDER_SITE_OTHER): Payer: Medicare Other | Admitting: Endocrinology

## 2014-06-07 ENCOUNTER — Telehealth: Payer: Self-pay

## 2014-06-07 VITALS — BP 112/74 | HR 110 | Temp 97.4°F | Resp 12 | Ht 67.0 in | Wt 174.0 lb

## 2014-06-07 VITALS — BP 112/74 | HR 110 | Resp 12 | Ht 67.0 in | Wt 174.0 lb

## 2014-06-07 DIAGNOSIS — Z9114 Patient's other noncompliance with medication regimen: Secondary | ICD-10-CM

## 2014-06-07 DIAGNOSIS — M549 Dorsalgia, unspecified: Secondary | ICD-10-CM

## 2014-06-07 DIAGNOSIS — G8929 Other chronic pain: Secondary | ICD-10-CM

## 2014-06-07 DIAGNOSIS — E1165 Type 2 diabetes mellitus with hyperglycemia: Secondary | ICD-10-CM

## 2014-06-07 DIAGNOSIS — Z716 Tobacco abuse counseling: Secondary | ICD-10-CM

## 2014-06-07 DIAGNOSIS — IMO0002 Reserved for concepts with insufficient information to code with codable children: Secondary | ICD-10-CM

## 2014-06-07 DIAGNOSIS — R0789 Other chest pain: Secondary | ICD-10-CM

## 2014-06-07 LAB — GLUCOSE, POCT (MANUAL RESULT ENTRY): POC Glucose: 482 mg/dl — AB (ref 70–99)

## 2014-06-07 LAB — TROPONIN I: TNIDX: 0 ug/l (ref 0.00–0.06)

## 2014-06-07 MED ORDER — GLUCOSE BLOOD VI STRP: ORAL_STRIP | Status: DC

## 2014-06-07 MED ORDER — HYDROCODONE-ACETAMINOPHEN 5-325 MG PO TABS: 1.0000 | ORAL_TABLET | Freq: Every day | ORAL | Status: DC | PRN

## 2014-06-07 NOTE — Assessment & Plan Note (Signed)
Refuses today that he is ready to quit smoking.

## 2014-06-07 NOTE — Assessment & Plan Note (Signed)
Discussed that he needs to be taking his medications as directed - so that we could work with him to improve his sugars.

## 2014-06-07 NOTE — Assessment & Plan Note (Signed)
Worsening. Pt history is very vague. He states it is starting to feel like when he had his heart attack again. EKG shows a BBB and is same as previous EKG from 2015 at Cardiology office. Will obtain stat Troponin- low suspicion, but want to make sure he is not actively having an issue. FU next month. FU with cardiology soon.

## 2014-06-07 NOTE — Progress Notes (Signed)
Pre visit review using our clinic review tool, if applicable. No additional management support is needed unless otherwise documented below in the visit note. 

## 2014-06-07 NOTE — Progress Notes (Signed)
Subjective:    Patient ID: Kyle [name], male    DOB: 1960-10-27, 54 y.o.   MRN: [id number]  HPI  [name] is a 54 yo male with a CC of chronic pain.   1)  30 years ago. Started in the [employer]. Had troops unload truck and crate fell on you.   Neck pain and lower back. X-rays in past. Getting worse. Described as achy, dull, stabbing. Neck is most painful and depends on what he is doing how painful it is. 10/10 severity.   Not been seen by a surgeon in past.   Chest tightness and SOB- States it feels like when he had his heart attack  Hot showers- helpful somewhat Ibuprofen hurts stomach Robaxin- Not helpful  Gabapentin- Not helpful  Heating pad- not helpful  Hydrocodone-acetaminophen- Taking in past, taking as needed  Alcohol- out of control with alcohol, States he is using it for pain control Smoking- Refuses to stop smoking right now due to pain.  Depression- Feels that the pain is getting to be too much and he states he has thought about death, but has no plan or want to commit suicide or homicide.  New job- Radiographer, therapeutic   Review of Systems  Constitutional: Positive for diaphoresis. Negative for fever, chills and fatigue.       Night sweats happened once  Respiratory: Positive for chest tightness and shortness of breath. Negative for wheezing.   Cardiovascular: Positive for chest pain. Negative for palpitations and leg swelling.       Tachycardia.   Gastrointestinal: Negative for nausea, vomiting and diarrhea.  Musculoskeletal: Positive for myalgias, back pain and neck pain.  Skin: Negative for rash.  Psychiatric/Behavioral: Positive for sleep disturbance. Negative for suicidal ideas and self-injury. The patient is not nervous/anxious.    Past Medical History  Diagnosis Date  . Asthma   . Diabetes mellitus without complication   . COPD (chronic obstructive pulmonary disease)   . Fainting spell   . GERD (gastroesophageal reflux disease)   .  Allergy   . Hyperlipidemia   . Hypertension   . Urine incontinence   . CAD (coronary artery disease)   . 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 Chronic back pain 1981  . DDD (degenerative disc disease)     History   Social History  . Marital Status: Single    Spouse Name: N/A    Number of Children: N/A  . Years of Education: 14   Occupational History  . 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 Smoking status: Current Every Day Smoker -- 35 years    Types: Cigarettes    Last Attempt to Quit: 11/24/2012  . Smokeless tobacco: Never Used  . Alcohol Use: Yes     Comment: occasional  . Drug Use: No  . Sexual Activity: Not on file   Other Topics Concern  . Not on file   Social History Narrative   [name] grew up in the [place] area. He was in the [employer] for 4 years. 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 He is currently disabled. [name] lives by himself but has excellent support system. He is very close to his family. He is currently taking online courses through [employer]. He is trying to obtain a degree in social services and ministry. His goal is  to work with at-risk young adults.      Regular exercise: not recently   Caffeine use: occasionally    Past Surgical History  Procedure Laterality Date  . 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 Cardiac catheterization  11/2012    [facility]    Family History  Problem Relation Age of Onset  . Heart disease Mother   . Heart disease Father   . Hypertension Father   . Diabetes Sister   . Cancer Sister     skin cancer  . Diabetes Other   . Heart disease Other     Allergies  Allergen Reactions  . Amoxicillin   . Flexeril [Cyclobenzaprine]     rash  . Methadone   . Tramadol   . Trazodone And Nefazodone     Current Outpatient Prescriptions on File Prior to Visit  Medication Sig  Dispense Refill  . albuterol (PROVENTIL HFA;VENTOLIN HFA) 108 (90 BASE) MCG/ACT inhaler Inhale 2 puffs into the lungs as needed.     Marland Kitchen aspirin 325 MG tablet Take 1 tablet (325 mg total) by mouth daily. 30 tablet 5  . gabapentin (NEURONTIN) 800 MG tablet Take 800 mg by mouth 3 (three) times daily.     Marland Kitchen glucose blood test strip 1 each by Other route as needed for other. Precision xtra test strip    . HYDROcodone-acetaminophen (NORCO/VICODIN) 5-325 MG per tablet Take 1 tablet by mouth every 8 (eight) hours as needed. 90 tablet 0  . hydrOXYzine (ATARAX/VISTARIL) 25 MG tablet Take 25 mg by mouth every 8 (eight) hours as needed for itching.    . insulin NPH-regular Human (NOVOLIN 70/30) (70-30) 100 UNIT/ML injection 30 units in the AM and 20 units with evening meal    . Insulin Syringes, Disposable, U-100 1 ML MISC To inject 70/30 bid 100 each 6  . Lancets (ACCU-CHEK SOFT TOUCH) lancets Use as instructed 100 each 5  . LANCETS ULTRA FINE MISC To check blood sugar four times daily 100 each 6  . metFORMIN (GLUCOPHAGE) 1000 MG tablet Take 1 tablet (1,000 mg total) by mouth 2 (two) times daily with a meal. 60 tablet 5  . methocarbamol (ROBAXIN) 750 MG tablet Take 1 tablet (750 mg total) by mouth 3 (three) times daily as needed for muscle spasms. 45 tablet 1  . metoprolol tartrate (LOPRESSOR) 25 MG tablet Take 1.5 tablets (37.5 mg total) by mouth 2 (two) times daily. 135 tablet 3  . omeprazole (PRILOSEC) 20 MG capsule Take 1 capsule (20 mg total) by mouth daily. 30 capsule 3  . sildenafil (VIAGRA) 100 MG tablet Take 100 mg by mouth daily as needed for erectile dysfunction.    . simvastatin (ZOCOR) 40 MG tablet Take 1 tablet (40 mg total) by mouth at bedtime. 90 tablet 3   No current facility-administered medications on file prior to visit.       Objective:   Physical Exam  Constitutional: He is oriented to person, place, and time. He appears well-developed and well-nourished. No distress.  BP 112/74  mmHg  Pulse 110  Temp(Src) 97.4 F (36.3 C) (Oral)  Resp 12  Ht 5\' 7"  (1.702 m)  Wt 174 lb (78.926 kg)  BMI 27.25 kg/m2  SpO2 95%   HENT:  Head: Normocephalic and atraumatic.  Right Ear: External ear normal.  Left Ear: External ear normal.  Eyes: Conjunctivae and EOM are normal. Pupils are equal, round, and reactive to light.  Right eye exhibits no discharge. Left eye exhibits no discharge. No scleral icterus.  Red eyes  Neck: Normal range of motion. No thyromegaly present.  Tender to palpation  Cardiovascular: Regular rhythm.  Exam reveals no gallop and no friction rub.   No murmur heard. Pulmonary/Chest: Effort normal and breath sounds normal. No respiratory distress. He has no wheezes. He has no rales. He exhibits no tenderness.  Musculoskeletal: Normal range of motion. He exhibits no edema or tenderness.  Lymphadenopathy:    He has no cervical adenopathy.  Neurological: He is alert and oriented to person, place, and time. He displays normal reflexes. No cranial nerve deficit. He exhibits normal muscle tone. Coordination normal.  Normal tone upper and lower extremities- WEAK EFFORT  Skin: Skin is warm. No rash noted. He is diaphoretic.  Psychiatric: He has a normal mood and affect. His behavior is normal. Judgment and thought content normal.      Assessment & Plan:

## 2014-06-07 NOTE — Patient Instructions (Signed)
Follow up in 1 month for pain and depression

## 2014-06-07 NOTE — Patient Instructions (Addendum)
Follow up with Kyle [name] today after this appointment.  Further plan TBD based on that appointment. Has been having chest pain on and off for the past few days, with hyperglycemia secondary to non compliance and neck pain. Concerned about medication non compliance. Consider checking CMP today.  Restart taking the Novolin 70/30 at 30 units am and 20 units pm.  Check sugars 2-3 x daily.  Report back today afternoon with sugar readings.    Please come back for a follow-up appointment in 1 weeks

## 2014-06-07 NOTE — Assessment & Plan Note (Signed)
Discussed again at length that he needs to check his sugars, take his medications while we work with him on his pain issues. He is having a follow up appt with his PCP after my appointment and I have discussed his case with her as well including his symptoms that were reported today.  I am concerned that he is not taking care of himself due to the pain/low mood/alcohol use.  We again discussed what he would be willing to do. He has agreed to check his sugars 2 x daily and his insulin and Metformin as prescribed. He will do that from today. Report back with his sugars later this afternoon to make sure that there is an improvement. He appears to be well compensated from his chronic hyperglycemia. Will check CMP to rule out AKI/anion gap acidosis given his hyperglycemia and tachycardia.  Remainder plan TBD following PCP appointment.   Bring meter/log to next appointment.    We discussed that alcohol is not a solution to his pain. I reminded him regarding his pain medications on his medication list. He will discuss this with his PCP today.  RTC 1 weeks

## 2014-06-07 NOTE — Assessment & Plan Note (Signed)
Non-compliant with diabetic medication or other treatments in past and present.

## 2014-06-07 NOTE — Telephone Encounter (Signed)
Rx sent to pharmacy per patient's request.  

## 2014-06-07 NOTE — Progress Notes (Signed)
Reason for visit-  Kyle [name] is a 54 y.o.-year-old male, for  Follow up of Type 2 diabetes, uncontrolled, without complications. Associated problem of CAD. Last visit 2 weeks ago.    HPI- Patient has been diagnosed with diabetes in 2014. Recalls being initially on lifestyle modifications.   Started on metformin shortly after. he has been insulin since about 2014.  Has been uncontrolled this year atleast due to non compliance with drug regimen  *Patient reports struggling with pain in his neck and back, and reports that he has been turning to alcohol recently for pain. 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 Prior had been sober for preceding 7 years Is also a smoker. Didn't drink today morning.  * also appears sad today due to the pain. Denies active suicide ideation- says that the thought crossed his mind, but doesn't have a plan to go through with it and "wouldn't do that to myself". Has appt with PCP right after mine today * has been having chest pain on and off for the past week. Denies any chest pain today.    Pt is currently on a regimen of: - Metformin 1000 mg po bid ( taking 2 times per week or once daily) - Novolin 70/30 at 30 units am and 20 units supper ( taking once weekly)- didn't take insulin today   Last hemoglobin A1c was: Lab Results  Component Value Date   HGBA1C 13.0* 04/11/2014   HGBA1C 12.6* 01/05/2014   HGBA1C 13.4* 10/01/2013     Pt checks his sugars  <1 a day -about once weekly. Uses one touch ultra2 glucometer. By recall, they are- :  PREMEAL Breakfast Lunch Dinner Bedtime Overall  Glucose range: 482    150-160  Mean/median:        POST-MEAL PC Breakfast PC Lunch PC Dinner  Glucose range:     Mean/median:      Last hospital stay 1 year ago for hyperglycemia. Today FS very elevated 400+, denies systemic symptoms  Hypoglycemia-  No lows. Lowest sugar was n/a; he has hypoglycemia awareness at 70.   Dietary habits- eats 1-2  times daily. Tries to limit carbs. Loves bread. White bread. Drinking alcohol.  Exercise- walks/lifts weight every other day Weight - decreasing with symptoms of hyperglycemia Wt Readings from Last 3 Encounters:  06/07/14 174 lb (78.926 kg)  06/07/14 174 lb (78.926 kg)  05/17/14 174 lb 8 oz (79.153 kg)    Diabetes Complications-  Nephropathy- No  CKD, last BUN/creatinine-  Lab Results  Component Value Date   BUN 9 10/01/2013   CREATININE 0.7 10/01/2013   Lab Results  Component Value Date   GFR 164.14 10/01/2013   Lab Results  Component Value Date   MICRALBCREAT 0.8 04/11/2014    Retinopathy- No, Last DEE was in 2015 ( 8-9 months ago per pt) Neuropathy- no numbness and tingling in his feet. No known neuropathy. Is on neurontin for back pain Associated history - hasCAD . No prior stroke. No hypothyroidism. his last TSH was  Lab Results  Component Value Date   TSH 1.34 02/24/2013    Hyperlipidemia-  his last set of lipids were- Currently on zocor 40 mg daily. Tolerating well.   Lab Results  Component Value Date   CHOL 135 11/24/2013   HDL 34.50* 11/24/2013   LDLDIRECT 74.1 11/24/2013   TRIG 238.0* 11/24/2013   CHOLHDL 4 11/24/2013   Lab Results  Component Value Date   AST 33 01/11/2014  AST 52* 01/05/2014   ALT 59* 01/11/2014   ALT 88* 01/05/2014    Blood Pressure/HTN- Patient's blood pressure is well controlled today.   I have reviewed the patient's past medical history, medications and allergies.    Current Outpatient Prescriptions on File Prior to Visit  Medication Sig Dispense Refill  . albuterol (PROVENTIL HFA;VENTOLIN HFA) 108 (90 BASE) MCG/ACT inhaler Inhale 2 puffs into the lungs as needed.     Marland Kitchen aspirin 325 MG tablet Take 1 tablet (325 mg total) by mouth daily. 30 tablet 5  . gabapentin (NEURONTIN) 800 MG tablet Take 800 mg by mouth 3 (three) times daily.     Marland Kitchen glucose blood test strip 1 each by Other route as needed for other. Precision xtra test  strip    . hydrOXYzine (ATARAX/VISTARIL) 25 MG tablet Take 25 mg by mouth every 8 (eight) hours as needed for itching.    . insulin NPH-regular Human (NOVOLIN 70/30) (70-30) 100 UNIT/ML injection 30 units in the AM and 20 units with evening meal    . Insulin Syringes, Disposable, U-100 1 ML MISC To inject 70/30 bid 100 each 6  . Lancets (ACCU-CHEK SOFT TOUCH) lancets Use as instructed 100 each 5  . LANCETS ULTRA FINE MISC To check blood sugar four times daily 100 each 6  . metFORMIN (GLUCOPHAGE) 1000 MG tablet Take 1 tablet (1,000 mg total) by mouth 2 (two) times daily with a meal. 60 tablet 5  . methocarbamol (ROBAXIN) 750 MG tablet Take 1 tablet (750 mg total) by mouth 3 (three) times daily as needed for muscle spasms. 45 tablet 1  . metoprolol tartrate (LOPRESSOR) 25 MG tablet Take 1.5 tablets (37.5 mg total) by mouth 2 (two) times daily. 135 tablet 3  . omeprazole (PRILOSEC) 20 MG capsule Take 1 capsule (20 mg total) by mouth daily. 30 capsule 3  . sildenafil (VIAGRA) 100 MG tablet Take 100 mg by mouth daily as needed for erectile dysfunction.    . simvastatin (ZOCOR) 40 MG tablet Take 1 tablet (40 mg total) by mouth at bedtime. 90 tablet 3   No current facility-administered medications on file prior to visit.   Allergies  Allergen Reactions  . Amoxicillin   . Flexeril [Cyclobenzaprine]     rash  . Methadone   . Tramadol   . Trazodone And Nefazodone      Review of Systems- [ x ]  Complains of    [  ]  denies [  ] Recent weight change [ x ]  Fatigue [x  ] polydipsia [x  ] polyuria [ x ]  nocturia [ x ]  vision difficulty [ x ] chest pain [ x ] shortness of breath-occasional [  ] leg swelling [  ] cough [  ] nausea/vomiting [  ] diarrhea [  ] constipation [  ] abdominal pain [  ]  tingling/numbness in extremities [  ]  concern with feet ( wounds/sores)   PE: BP 112/74 mmHg  Pulse 110  Resp 12  Ht 5\' 7"  (1.702 m)  Wt 174 lb (78.926 kg)  BMI 27.25 kg/m2  SpO2 95% Wt Readings  from Last 3 Encounters:  06/07/14 174 lb (78.926 kg)  06/07/14 174 lb (78.926 kg)  05/17/14 174 lb 8 oz (79.153 kg)   GENERAL: No acute distress, well developed LUNGS:         Chest is symmetrical. Lungs are clear to auscultation.Marland Kitchen   HEART:  Heart sounds:  S1 and S2 are normal. No murmurs or clicks heard. EXTREMITIES:     There is no edema. 2+ DP pulses   ASSESSMENT AND PLAN: Problem List Items Addressed This Visit      Other   Diabetes mellitus type 2, uncontrolled - Primary    Discussed again at length that he needs to check his sugars, take his medications while we work with him on his pain issues. He is having a follow up appt with his PCP after my appointment and I have discussed his case with her as well including his symptoms that were reported today.  I am concerned that he is not taking care of himself due to the pain/low mood/alcohol use.  We again discussed what he would be willing to do. He has agreed to check his sugars 2 x daily and his insulin and Metformin as prescribed. He will do that from today. Report back with his sugars later this afternoon to make sure that there is an improvement. He appears to be well compensated from his chronic hyperglycemia. Will check CMP to rule out AKI/anion gap acidosis given his hyperglycemia and tachycardia.  Remainder plan TBD following PCP appointment.   Bring meter/log to next appointment.    We discussed that alcohol is not a solution to his pain. I reminded him regarding his pain medications on his medication list. He will discuss this with his PCP today.  RTC 1 weeks        Non compliance w medication regimen    Discussed that he needs to be taking his medications as directed - so that we could work with him to improve his sugars.        Other Visit Diagnoses    Type 2 diabetes mellitus with hyperglycemia        Relevant Orders    POCT Glucose (CBG) (Completed)    Comprehensive metabolic panel       - Return  to clinic in 1 weeks with sugar log/meter. 25 minutes spent with the patient, >50% time spent on counseling on topics mentioned above. Discussed his case with his PCP as well today, and the issues not addressed by me during this visit will be addressed by Morey Hummingbird.  Eulogia Dismore [facility] 06/07/2014 12:02 PM

## 2014-06-07 NOTE — Assessment & Plan Note (Addendum)
Chronic neck and back pain from [employer]. Pt has failed out of pain management for non-compliance in past. Will obtain x-rays of neck at next visit in 1 month. Rx for hydrocodone-acetaminophen 5-325 mg daily for 30 days. UDS and contract signed today.

## 2014-06-08 ENCOUNTER — Telehealth: Payer: Self-pay

## 2014-06-08 NOTE — Telephone Encounter (Signed)
Spoke to patient who stated that insurance will not cover one touch supplies. [facility] is [facility] to see if [facility] will [facility] them or if he can find out what meter is covered so we can order a new one for him. Patient is completely out of test strips at this time. I told patient he can come by the office to pick up some test strips until we get things figured out. 3 boxes of strips have been placed at the front desk for pick up.

## 2014-06-09 ENCOUNTER — Other Ambulatory Visit (INDEPENDENT_AMBULATORY_CARE_PROVIDER_SITE_OTHER): Payer: Medicare Other

## 2014-06-09 DIAGNOSIS — IMO0002 Reserved for concepts with insufficient information to code with codable children: Secondary | ICD-10-CM

## 2014-06-09 DIAGNOSIS — E1165 Type 2 diabetes mellitus with hyperglycemia: Secondary | ICD-10-CM

## 2014-06-09 LAB — COMPREHENSIVE METABOLIC PANEL
ALBUMIN: 4.2 g/dL (ref 3.5–5.2)
ALT: 109 U/L — AB (ref 0–53)
AST: 76 U/L — ABNORMAL HIGH (ref 0–37)
Alkaline Phosphatase: 69 U/L (ref 39–117)
BUN: 7 mg/dL (ref 6–23)
CO2: 19 mEq/L (ref 19–32)
Calcium: 9.4 mg/dL (ref 8.4–10.5)
Chloride: 101 mEq/L (ref 96–112)
Creatinine, Ser: 0.82 mg/dL (ref 0.40–1.50)
GFR: 125.22 mL/min (ref >60.00)
GLUCOSE: 445 mg/dL — AB (ref 70–99)
POTASSIUM: 4.2 meq/L (ref 3.5–5.1)
SODIUM: 137 meq/L (ref 135–145)
TOTAL PROTEIN: 6.9 g/dL (ref 6.0–8.3)
Total Bilirubin: 0.3 mg/dL (ref 0.2–1.2)

## 2014-06-14 ENCOUNTER — Ambulatory Visit: Payer: Medicare Other | Admitting: Nurse Practitioner

## 2014-06-14 ENCOUNTER — Encounter: Payer: Self-pay | Admitting: Endocrinology

## 2014-06-14 ENCOUNTER — Ambulatory Visit (INDEPENDENT_AMBULATORY_CARE_PROVIDER_SITE_OTHER): Payer: Medicare Other | Admitting: Endocrinology

## 2014-06-14 VITALS — BP 138/86 | HR 81 | Resp 12 | Ht 67.0 in | Wt 176.0 lb

## 2014-06-14 DIAGNOSIS — IMO0002 Reserved for concepts with insufficient information to code with codable children: Secondary | ICD-10-CM

## 2014-06-14 DIAGNOSIS — E1165 Type 2 diabetes mellitus with hyperglycemia: Secondary | ICD-10-CM

## 2014-06-14 LAB — POCT GLUCOSE (DEVICE FOR HOME USE)

## 2014-06-14 NOTE — Patient Instructions (Addendum)
Check sugars atleast 3 times daily.   Call us back in about 1 week to tell us your sugars- so that the insulins could be further adjusted.   Continue current dos of insulin for now.   Please come back for a follow-up appointment in 1 month.

## 2014-06-14 NOTE — Progress Notes (Signed)
Pre visit review using our clinic review tool, if applicable. No additional management support is needed unless otherwise documented below in the visit note. 

## 2014-06-14 NOTE — Progress Notes (Signed)
Reason for visit-  Kyle [name] is a 54 y.o.-year-old male, for  Follow up of Type 2 diabetes, uncontrolled, without complications. Associated problem of CAD. Last visit 1 weeks ago.    HPI- Patient has been diagnosed with diabetes in 2014. Recalls being initially on lifestyle modifications.   Started on metformin shortly after. he has been insulin since about 2014.  Has been uncontrolled this year atleast due to non compliance with drug regimen  *Patient reports struggling with pain in his neck and back, and reports that he has been turning to alcohol recently for pain. 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 Prior had been sober for preceding 7 years Is also a smoker. Didn't drink today morning>>now cut back significantly on alcohol intake after last visit  * mood is a lot better today, reports feeling well overall   Pt is currently on a regimen of: - Metformin 1000 mg po bid ( taking it as directed) - Novolin 70/30 at 30 units am and 20 units supper   Last hemoglobin A1c was: Lab Results  Component Value Date   HGBA1C 13.0* 04/11/2014   HGBA1C 12.6* 01/05/2014   HGBA1C 13.4* 10/01/2013     Pt checks his sugars  <1 a day -about once weekly. Uses one touch ultra2 glucometer. By review, they are- :  PREMEAL Breakfast Lunch Dinner Bedtime Overall  Glucose range:  219 today in clinic   129 one reading  Mean/median:        POST-MEAL PC Breakfast PC Lunch PC Dinner  Glucose range:     Mean/median:      Last hospital stay 1 year ago for hyperglycemia.   Hypoglycemia-  No lows. Lowest sugar was n/a; he has hypoglycemia awareness at 70.   Dietary habits- eats 1-2 times daily. Tries to limit carbs. Loves bread. White bread. Cut back on alcohol intake. Cut out all sodas. Exercise- walks/lifts weight every other day- now working Weight - stable with improving symptoms of hyperglycemia Wt Readings from Last 3 Encounters:  06/14/14 176 lb (79.833 kg)   06/07/14 174 lb (78.926 kg)  06/07/14 174 lb (78.926 kg)    Diabetes Complications-  Nephropathy- No  CKD, last BUN/creatinine-  Lab Results  Component Value Date   BUN 7 06/09/2014   CREATININE 0.82 06/09/2014   Lab Results  Component Value Date   GFR 125.22 06/09/2014   Lab Results  Component Value Date   MICRALBCREAT 0.8 04/11/2014    Retinopathy- No, Last DEE was in 2015 ( 8-9 months ago per pt) Neuropathy- no numbness and tingling in his feet. No known neuropathy. Is on neurontin for back pain Associated history - hasCAD . No prior stroke. No hypothyroidism. his last TSH was  Lab Results  Component Value Date   TSH 1.34 02/24/2013    Hyperlipidemia-  his last set of lipids were- Currently on zocor 40 mg daily. Tolerating well.   Lab Results  Component Value Date   CHOL 135 11/24/2013   HDL 34.50* 11/24/2013   LDLDIRECT 74.1 11/24/2013   TRIG 238.0* 11/24/2013   CHOLHDL 4 11/24/2013   Lab Results  Component Value Date   AST 76* 06/09/2014   AST 33 01/11/2014   ALT 109* 06/09/2014   ALT 59* 01/11/2014    Blood Pressure/HTN- Patient's blood pressure is close to well controlled today.   I have reviewed the patient's past medical history, medications and allergies.    Current Outpatient Prescriptions on File  Prior to Visit  Medication Sig Dispense Refill  . albuterol (PROVENTIL HFA;VENTOLIN HFA) 108 (90 BASE) MCG/ACT inhaler Inhale 2 puffs into the lungs as needed.     Marland Kitchen aspirin 325 MG tablet Take 1 tablet (325 mg total) by mouth daily. 30 tablet 5  . gabapentin (NEURONTIN) 800 MG tablet Take 800 mg by mouth 3 (three) times daily.     Marland Kitchen glucose blood test strip OneTouch ultra mini test strips. Use 4 times daily. Dx 250.0 100 each 5  . HYDROcodone-acetaminophen (NORCO/VICODIN) 5-325 MG per tablet Take 1 tablet by mouth daily as needed for moderate pain. 30 tablet 0  . hydrOXYzine (ATARAX/VISTARIL) 25 MG tablet Take 25 mg by mouth every 8 (eight) hours as  needed for itching.    . insulin NPH-regular Human (NOVOLIN 70/30) (70-30) 100 UNIT/ML injection 30 units in the AM and 20 units with evening meal    . Insulin Syringes, Disposable, U-100 1 ML MISC To inject 70/30 bid 100 each 6  . Lancets (ACCU-CHEK SOFT TOUCH) lancets Use as instructed 100 each 5  . LANCETS ULTRA FINE MISC To check blood sugar four times daily 100 each 6  . metFORMIN (GLUCOPHAGE) 1000 MG tablet Take 1 tablet (1,000 mg total) by mouth 2 (two) times daily with a meal. 60 tablet 5  . methocarbamol (ROBAXIN) 750 MG tablet Take 1 tablet (750 mg total) by mouth 3 (three) times daily as needed for muscle spasms. 45 tablet 1  . metoprolol tartrate (LOPRESSOR) 25 MG tablet Take 1.5 tablets (37.5 mg total) by mouth 2 (two) times daily. 135 tablet 3  . omeprazole (PRILOSEC) 20 MG capsule Take 1 capsule (20 mg total) by mouth daily. 30 capsule 3  . sildenafil (VIAGRA) 100 MG tablet Take 100 mg by mouth daily as needed for erectile dysfunction.    . simvastatin (ZOCOR) 40 MG tablet Take 1 tablet (40 mg total) by mouth at bedtime. 90 tablet 3   No current facility-administered medications on file prior to visit.   Allergies  Allergen Reactions  . Amoxicillin   . Flexeril [Cyclobenzaprine]     rash  . Methadone   . Tramadol   . Trazodone And Nefazodone      Review of Systems- [ x ]  Complains of    [  ]  denies [  ] Recent weight change [ x ]  Fatigue [x  ] polydipsia [  ] polyuria [ x ]  nocturia [ ]   vision difficulty [  ] chest pain [ x ] shortness of breath-occasional [  ] leg swelling [  ] cough [  ] nausea/vomiting [  ] diarrhea [  ] constipation [  ] abdominal pain [  ]  tingling/numbness in extremities [  ]  concern with feet ( wounds/sores)   PE: BP 138/86 mmHg  Pulse 81  Resp 12  Ht 5\' 7"  (1.702 m)  Wt 176 lb (79.833 kg)  BMI 27.56 kg/m2  SpO2 98% Wt Readings from Last 3 Encounters:  06/14/14 176 lb (79.833 kg)  06/07/14 174 lb (78.926 kg)  06/07/14 174 lb  (78.926 kg)   Exam: deferred  ASSESSMENT AND PLAN: Problem List Items Addressed This Visit      Other   Diabetes mellitus type 2, uncontrolled - Primary    Congratulated him on recent effort. He initially had problems getting his testing supplies, but now reports that the shipment is on its way. Have given him test strips for the  next 10 days, and he was encouraged to check his sugars  3x daily.  Need more data to adjust his sugars. Suspect that he is going to need more insulin, but hesistent on making a change based on one reading.  I have asked him to call back in 1 week with his sugars so that his insulins could be adjusted further.  Continue current metformin for now. Continue current 70/30 insulin at 30 units am and 20 units in the evening.      RTC 1 month             - Return to clinic in 1 month with sugar log/meter   [facility] 06/14/2014 11:36 AM

## 2014-06-14 NOTE — Assessment & Plan Note (Signed)
Congratulated him on recent effort. He initially had problems getting his testing supplies, but now reports that the shipment is on its way. Have given him test strips for the next 10 days, and he was encouraged to check his sugars  3x daily.  Need more data to adjust his sugars. Suspect that he is going to need more insulin, but hesistent on making a change based on one reading.  I have asked him to call back in 1 week with his sugars so that his insulins could be adjusted further.  Continue current metformin for now. Continue current 70/30 insulin at 30 units am and 20 units in the evening.      RTC 1 month

## 2014-06-15 ENCOUNTER — Ambulatory Visit (INDEPENDENT_AMBULATORY_CARE_PROVIDER_SITE_OTHER): Payer: Medicare Other | Admitting: Nurse Practitioner

## 2014-06-15 ENCOUNTER — Encounter: Payer: Self-pay | Admitting: Nurse Practitioner

## 2014-06-15 VITALS — BP 126/82 | HR 101 | Temp 97.4°F | Resp 12 | Ht 67.0 in | Wt 175.8 lb

## 2014-06-15 DIAGNOSIS — J019 Acute sinusitis, unspecified: Secondary | ICD-10-CM

## 2014-06-15 DIAGNOSIS — B9689 Other specified bacterial agents as the cause of diseases classified elsewhere: Secondary | ICD-10-CM | POA: Insufficient documentation

## 2014-06-15 MED ORDER — DOXYCYCLINE HYCLATE 100 MG PO TABS: 100.0000 mg | ORAL_TABLET | Freq: Two times a day (BID) | ORAL | Status: DC

## 2014-06-15 NOTE — Patient Instructions (Addendum)
Take the doxycyline 1 tablet twice daily for 5 days.   Call us if fails to improve or worsens.

## 2014-06-15 NOTE — Assessment & Plan Note (Signed)
1 Month of worsening symptoms. Will try doxycyline 100 mg twice daily for 5 days. Asked him to call if not improving or worsening.

## 2014-06-15 NOTE — Progress Notes (Signed)
Pre visit review using our clinic review tool, if applicable. No additional management support is needed unless otherwise documented below in the visit note. 

## 2014-06-15 NOTE — Progress Notes (Signed)
   Subjective:    Patient ID: Kyle [name], male    DOB: [birth year], 53 y.o.   MRN: [id number]  HPI  Mr. Devino is a 54 yo male with a CC of nasal drainage x 1 month.   1) Pain is better, stopped some alcohol not all the way yet,   Draining down throat, nose bleeding easily stopped- happened approx. 2-3 times in the last month. Right side of maxillary sinuses more painful than left.  Flonase nasal spray- Not helpful  Allergy medicine- Not helpful   Blood sugars- did not check his BS yet this morning, works 3rd shift.   Review of Systems  Constitutional: Positive for fatigue. Negative for fever, chills and diaphoresis.  HENT: Positive for nosebleeds, postnasal drip, rhinorrhea, sinus pressure and sore throat. Negative for ear discharge, ear pain and sneezing.        Maxillary pressure  Eyes: Negative for visual disturbance.  Respiratory: Negative for chest tightness, shortness of breath and wheezing.   Cardiovascular: Negative for chest pain, palpitations and leg swelling.  Gastrointestinal: Positive for nausea. Negative for vomiting and diarrhea.  Skin: Negative for rash.  Neurological: Positive for headaches. Negative for dizziness.       Objective:   Physical Exam  Constitutional: He is oriented to person, place, and time. He appears well-developed and well-nourished. No distress.  BP 126/82 mmHg  Pulse 101  Temp(Src) 97.4 F (36.3 C) (Oral)  Resp 12  Ht 5\' 7"  (1.702 m)  Wt 175 lb 12.8 oz (79.742 kg)  BMI 27.53 kg/m2  SpO2 97%   HENT:  Head: Normocephalic and atraumatic.  Right Ear: External ear normal.  Left Ear: External ear normal.  Mouth/Throat: No oropharyngeal exudate.  Boggy turbinates, crusted nose, TM's clear  Eyes: Conjunctivae and EOM are normal. Pupils are equal, round, and reactive to light. Right eye exhibits no discharge. Left eye exhibits no discharge. No scleral icterus.  Cardiovascular: Normal rate, regular rhythm, normal heart sounds and intact  distal pulses.  Exam reveals no gallop and no friction rub.   No murmur heard. Pulmonary/Chest: Effort normal and breath sounds normal. No respiratory distress. He has no wheezes. He has no rales. He exhibits no tenderness.  Neurological: He is alert and oriented to person, place, and time.  Skin: Skin is warm and dry. No rash noted. He is not diaphoretic.  Psychiatric: He has a normal mood and affect. His behavior is normal. Judgment and thought content normal.       Assessment & Plan:

## 2014-07-05 ENCOUNTER — Ambulatory Visit (INDEPENDENT_AMBULATORY_CARE_PROVIDER_SITE_OTHER): Payer: Medicare Other | Admitting: Nurse Practitioner

## 2014-07-05 ENCOUNTER — Ambulatory Visit: Payer: Self-pay | Admitting: Nurse Practitioner

## 2014-07-05 ENCOUNTER — Telehealth: Payer: Self-pay

## 2014-07-05 ENCOUNTER — Encounter: Payer: Self-pay | Admitting: Nurse Practitioner

## 2014-07-05 VITALS — BP 128/82 | HR 95 | Temp 98.2°F | Resp 14 | Ht 67.0 in | Wt 174.0 lb

## 2014-07-05 DIAGNOSIS — M542 Cervicalgia: Secondary | ICD-10-CM | POA: Diagnosis not present

## 2014-07-05 DIAGNOSIS — E1165 Type 2 diabetes mellitus with hyperglycemia: Secondary | ICD-10-CM

## 2014-07-05 DIAGNOSIS — IMO0002 Reserved for concepts with insufficient information to code with codable children: Secondary | ICD-10-CM

## 2014-07-05 MED ORDER — OXYCODONE HCL 10 MG PO TABS: 10.0000 mg | ORAL_TABLET | Freq: Every day | ORAL | Status: DC

## 2014-07-05 NOTE — Assessment & Plan Note (Addendum)
C-spine x-ray at [facility]. Will follow. Rx for oxycodone 10 mg daily for pain in neck and back. Switching from Vicodin because of elevated LFT's, Hep C, and alcohol use. Will follow.

## 2014-07-05 NOTE — Progress Notes (Signed)
Pre visit review using our clinic review tool, if applicable. No additional management support is needed unless otherwise documented below in the visit note. 

## 2014-07-05 NOTE — Patient Instructions (Signed)
Sudafed PE over the counter or saline nasal spray to open up nasal passages  Follow up in 2 months.

## 2014-07-05 NOTE — Assessment & Plan Note (Signed)
Machine was uploaded with recent numbers today. Encourage pt to take BS often (as recommended by Dr. Howell Rucks) so we can get a better picture.

## 2014-07-05 NOTE — Progress Notes (Signed)
   Subjective:    Patient ID: Kyle [name], male    DOB: 10-30-1960, 54 y.o.   MRN: [id number]  HPI  Kyle [name] is a 54 yo male with a CC of 3 month follow up and pain management.   1) Back and neck pain- Pain medications are helpful. No recent neck x-ray   Drinks on weekend only now.   2) Last night, shaky, sweaty, nauseated  -Crackers and cheese felt better an hour later   - Did not check BS  BS 250 this morning, better energy he reports    Review of Systems  Constitutional: Negative for fever, chills, diaphoresis and fatigue.  Eyes: Negative for visual disturbance.  Respiratory: Negative for cough, chest tightness, shortness of breath and wheezing.   Cardiovascular: Negative for chest pain, palpitations and leg swelling.  Gastrointestinal: Negative for nausea, vomiting, abdominal pain, diarrhea and abdominal distention.  Musculoskeletal: Positive for back pain, arthralgias and neck pain. Negative for myalgias, joint swelling, gait problem and neck stiffness.  Skin: Negative for rash.  Neurological: Negative for dizziness, weakness and numbness.  Psychiatric/Behavioral: The patient is not nervous/anxious.        Objective:   Physical Exam  Constitutional: He is oriented to person, place, and time. He appears well-developed and well-nourished. No distress.  BP 128/82 mmHg  Pulse 95  Temp(Src) 98.2 F (36.8 C) (Oral)  Resp 14  Ht 5\' 7"  (1.702 m)  Wt 174 lb (78.926 kg)  BMI 27.25 kg/m2  SpO2 97%   HENT:  Head: Normocephalic and atraumatic.  Right Ear: External ear normal.  Left Ear: External ear normal.  Cardiovascular: Normal rate, regular rhythm, normal heart sounds and intact distal pulses.  Exam reveals no gallop and no friction rub.   No murmur heard. Pulmonary/Chest: Effort normal and breath sounds normal. No respiratory distress. He has no wheezes. He has no rales. He exhibits no tenderness.  Musculoskeletal: He exhibits tenderness. He exhibits no edema.    Neurological: He is alert and oriented to person, place, and time.  Skin: Skin is warm and dry. No rash noted. He is not diaphoretic.  Psychiatric: He has a normal mood and affect. His behavior is normal. Judgment and thought content normal.       Assessment & Plan:

## 2014-07-05 NOTE — Telephone Encounter (Signed)
Patient came into office today to see Kyle [name]. While here he gave me his glucometer to download his blood sugar readings. Readings given to Dr. Howell Rucks for review. Per Dr. Howell Rucks patient advised to check sugar T.I.D and increase p.m. Insulin to 25 units keep morning dose the same at 30 units. Patient verbalized understanding.

## 2014-07-12 ENCOUNTER — Other Ambulatory Visit: Payer: Self-pay | Admitting: Endocrinology

## 2014-07-12 ENCOUNTER — Ambulatory Visit (INDEPENDENT_AMBULATORY_CARE_PROVIDER_SITE_OTHER): Payer: Medicare Other | Admitting: Endocrinology

## 2014-07-12 ENCOUNTER — Encounter: Payer: Self-pay | Admitting: Endocrinology

## 2014-07-12 VITALS — BP 128/80 | HR 97 | Resp 14 | Ht 67.0 in | Wt 173.5 lb

## 2014-07-12 DIAGNOSIS — IMO0002 Reserved for concepts with insufficient information to code with codable children: Secondary | ICD-10-CM

## 2014-07-12 DIAGNOSIS — I10 Essential (primary) hypertension: Secondary | ICD-10-CM | POA: Diagnosis not present

## 2014-07-12 DIAGNOSIS — E785 Hyperlipidemia, unspecified: Secondary | ICD-10-CM

## 2014-07-12 DIAGNOSIS — E1165 Type 2 diabetes mellitus with hyperglycemia: Secondary | ICD-10-CM

## 2014-07-12 LAB — HEMOGLOBIN A1C: HEMOGLOBIN A1C: 10.7 % — AB (ref 4.6–6.5)

## 2014-07-12 MED ORDER — CANAGLIFLOZIN 100 MG PO TABS: 100.0000 mg | ORAL_TABLET | Freq: Every day | ORAL | Status: DC

## 2014-07-12 NOTE — Assessment & Plan Note (Signed)
Last lipids 10/2013. Not Taking statin regularly per his report. LDL at target at last check. Expect improvement in TG levels with better sugars. Asked him to improve compliance with meds.

## 2014-07-12 NOTE — Patient Instructions (Signed)
Take your current insulin at current doses.  Call in sugars this Friday for further adjustments.  Labs today.   Please come back for a follow-up appointment in 1 month.

## 2014-07-12 NOTE — Progress Notes (Signed)
Reason for visit-  Kyle [name] is a 54 y.o.-year-old male, for  Follow up of Type 2 diabetes, uncontrolled, without complications. Associated problem of CAD. Last visit Feb 2016.    HPI- Patient has been diagnosed with diabetes in 2014. Recalls being initially on lifestyle modifications.   Started on metformin shortly after. he has been insulin since about 2014.  Has been uncontrolled this year atleast due to non compliance with drug regimen  *Patient reports struggling with pain in his neck and back, and reports that he has been turning to alcohol recently for pain. Now pain better and down to 1 beer daily and 1 shot of liquer . Prior had been sober for preceding 7 years Is also a smoker. Didn't drink today morning>>now cut back significantly on alcohol intake after last visits * mood and pain a lot better, reports feeling well overall   Pt is currently on a regimen of: - Metformin 1000 mg po bid ( taking it as directed) - Novolin 70/30 at 30 units am and 20 units supper ( didn't go up on the dose as instructed last month)   Last hemoglobin A1c was: Lab Results  Component Value Date   HGBA1C 13.0* 04/11/2014   HGBA1C 12.6* 01/05/2014   HGBA1C 13.4* 10/01/2013     Pt checks his sugars  2 times daily ( by report). Uses one touch ultra2 glucometer. By recall, they are- :  PREMEAL Breakfast Lunch Dinner Bedtime Overall  Glucose range:     126-158  Mean/median:        POST-MEAL PC Breakfast PC Lunch PC Dinner  Glucose range:     Mean/median:      Last hospital stay 2015 for hyperglycemia.   Hypoglycemia-  No lows. Lowest sugar was n/a; he has hypoglycemia awareness at 70.   Dietary habits- eats 1-2 times daily. Tries to limit carbs. Loves bread. White bread>>wheat. Cut back on alcohol intake. Cut out all sodas. Binge eats 2 meals a week Exercise- walks/lifts weight every other day- still doing the walking, not working anymore Weight - decreasing, reports that cut  back on food intake  Wt Readings from Last 3 Encounters:  07/12/14 173 lb 8 oz (78.699 kg)  07/05/14 174 lb (78.926 kg)  06/15/14 175 lb 12.8 oz (79.742 kg)    Diabetes Complications-  Nephropathy- No  CKD, last BUN/creatinine-  Lab Results  Component Value Date   BUN 7 06/09/2014   CREATININE 0.82 06/09/2014   Lab Results  Component Value Date   GFR 125.22 06/09/2014   Lab Results  Component Value Date   MICRALBCREAT 0.8 04/11/2014    Retinopathy- No, Last DEE was in 2015 ( 8-9 months ago per pt) Neuropathy- no numbness and tingling in his feet. No known neuropathy. Is on neurontin for back pain Associated history - hasCAD . No prior stroke. No hypothyroidism. his last TSH was  Lab Results  Component Value Date   TSH 1.34 02/24/2013    Hyperlipidemia-  his last set of lipids were- Currently on zocor 40 mg daily ( not taking every day as directed since past week). Tolerating well.   Lab Results  Component Value Date   CHOL 135 11/24/2013   HDL 34.50* 11/24/2013   LDLDIRECT 74.1 11/24/2013   TRIG 238.0* 11/24/2013   CHOLHDL 4 11/24/2013   Lab Results  Component Value Date   AST 76* 06/09/2014   AST 33 01/11/2014   ALT 109* 06/09/2014   ALT 59* 01/11/2014  Blood Pressure/HTN- Patient's blood pressure is well controlled today.   I have reviewed the patient's past medical history, medications and allergies.    Current Outpatient Prescriptions on File Prior to Visit  Medication Sig Dispense Refill  . albuterol (PROVENTIL HFA;VENTOLIN HFA) 108 (90 BASE) MCG/ACT inhaler Inhale 2 puffs into the lungs as needed.     Marland Kitchen aspirin 325 MG tablet Take 1 tablet (325 mg total) by mouth daily. 30 tablet 5  . gabapentin (NEURONTIN) 800 MG tablet Take 800 mg by mouth 3 (three) times daily.     Marland Kitchen glucose blood test strip OneTouch ultra mini test strips. Use 4 times daily. Dx 250.0 100 each 5  . hydrOXYzine (ATARAX/VISTARIL) 25 MG tablet Take 25 mg by mouth every 8 (eight)  hours as needed for itching.    . insulin NPH-regular Human (NOVOLIN 70/30) (70-30) 100 UNIT/ML injection 30 units in the AM and 20 units with evening meal    . Insulin Syringes, Disposable, U-100 1 ML MISC To inject 70/30 bid 100 each 6  . Lancets (ACCU-CHEK SOFT TOUCH) lancets Use as instructed 100 each 5  . LANCETS ULTRA FINE MISC To check blood sugar four times daily 100 each 6  . metFORMIN (GLUCOPHAGE) 1000 MG tablet Take 1 tablet (1,000 mg total) by mouth 2 (two) times daily with a meal. 60 tablet 5  . methocarbamol (ROBAXIN) 750 MG tablet Take 1 tablet (750 mg total) by mouth 3 (three) times daily as needed for muscle spasms. 45 tablet 1  . metoprolol tartrate (LOPRESSOR) 25 MG tablet Take 1.5 tablets (37.5 mg total) by mouth 2 (two) times daily. 135 tablet 3  . omeprazole (PRILOSEC) 20 MG capsule Take 1 capsule (20 mg total) by mouth daily. 30 capsule 3  . Oxycodone HCl 10 MG TABS Take 1 tablet (10 mg total) by mouth daily. Fill on or after 08/05/2014 30 tablet 0  . sildenafil (VIAGRA) 100 MG tablet Take 100 mg by mouth daily as needed for erectile dysfunction.    . simvastatin (ZOCOR) 40 MG tablet Take 1 tablet (40 mg total) by mouth at bedtime. 90 tablet 3   No current facility-administered medications on file prior to visit.   Allergies  Allergen Reactions  . Amoxicillin   . Flexeril [Cyclobenzaprine]     rash  . Methadone   . Tramadol   . Trazodone And Nefazodone      Review of Systems- [ x ]  Complains of    [  ]  denies [  ] Recent weight change [x  ]  Fatigue [  ] polydipsia [  ] polyuria [  ]  nocturia [  ]  vision difficulty [  ] chest pain [  ] shortness of breath [  ] leg swelling [  ] cough [  ] nausea/vomiting [  ] diarrhea [  ] constipation [  ] abdominal pain [  ]  tingling/numbness in extremities [  ]  concern with feet ( wounds/sores)   PE: BP 128/80 mmHg  Pulse 97  Resp 14  Ht 5\' 7"  (1.702 m)  Wt 173 lb 8 oz (78.699 kg)  BMI 27.17 kg/m2  SpO2 97% Wt  Readings from Last 3 Encounters:  07/12/14 173 lb 8 oz (78.699 kg)  07/05/14 174 lb (78.926 kg)  06/15/14 175 lb 12.8 oz (79.742 kg)   Exam: deferred  ASSESSMENT AND PLAN: Problem List Items Addressed This Visit      Cardiovascular and Mediastinum  HTN (hypertension)    BP at target today. Recent urine MA negative.           Other   Hyperlipidemia    Last lipids 10/2013. Not Taking statin regularly per his report. LDL at target at last check. Expect improvement in TG levels with better sugars. Asked him to improve compliance with meds.         Diabetes mellitus type 2, uncontrolled - Primary    Congratulated him on recent effort. Reports checking 2 x daily recently. I have asked him to continue doing this and call in his readings this Friday ( in 3-4 days) for further adjustments. Also, discussed that if his A1c looks better then might consider adding Invokana. Discussed risk profile and side effects. He is agreeable and promises to take his new medication ( if added).    Need more data to adjust his sugars. Suspect that he is going to need more insulin, but hesistent on making a change based on his recall readings.    Continue current metformin for now. Continue current 70/30 insulin at 30 units am and 20 units in the evening.              Relevant Orders   Hemoglobin A1c      - Return to clinic in 1 month with sugar log/meter   [facility] 07/12/2014 9:45 AM

## 2014-07-12 NOTE — Assessment & Plan Note (Signed)
Congratulated him on recent effort. Reports checking 2 x daily recently. I have asked him to continue doing this and call in his readings this Friday ( in 3-4 days) for further adjustments. Also, discussed that if his A1c looks better then might consider adding Invokana. Discussed risk profile and side effects. He is agreeable and promises to take his new medication ( if added).    Need more data to adjust his sugars. Suspect that he is going to need more insulin, but hesistent on making a change based on his recall readings.    Continue current metformin for now. Continue current 70/30 insulin at 30 units am and 20 units in the evening.

## 2014-07-12 NOTE — Assessment & Plan Note (Signed)
BP at target today. Recent urine MA negative.

## 2014-07-12 NOTE — Progress Notes (Signed)
Pre visit review using our clinic review tool, if applicable. No additional management support is needed unless otherwise documented below in the visit note. 

## 2014-07-13 ENCOUNTER — Other Ambulatory Visit: Payer: Self-pay | Admitting: Nurse Practitioner

## 2014-07-13 MED ORDER — HYDROXYZINE HCL 25 MG PO TABS: 25.0000 mg | ORAL_TABLET | Freq: Three times a day (TID) | ORAL | Status: DC | PRN

## 2014-07-17 IMAGING — US ABDOMEN ULTRASOUND LIMITED
1 series · 14 of 25 positions shown · non-contrast
Comparison: 01/09/2005

CLINICAL DATA: Chronic hepatitis-C.

EXAM:
US ABDOMEN LIMITED - RIGHT UPPER QUADRANT

[Series 1: abdomen ultrasound limited · 0.29mm/px · 14 of 59 slices shown]
[im 1/59]
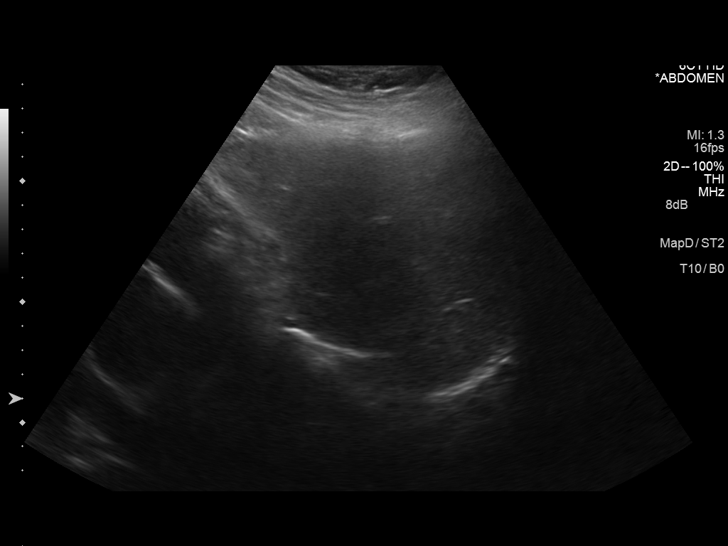
[im 5/59]
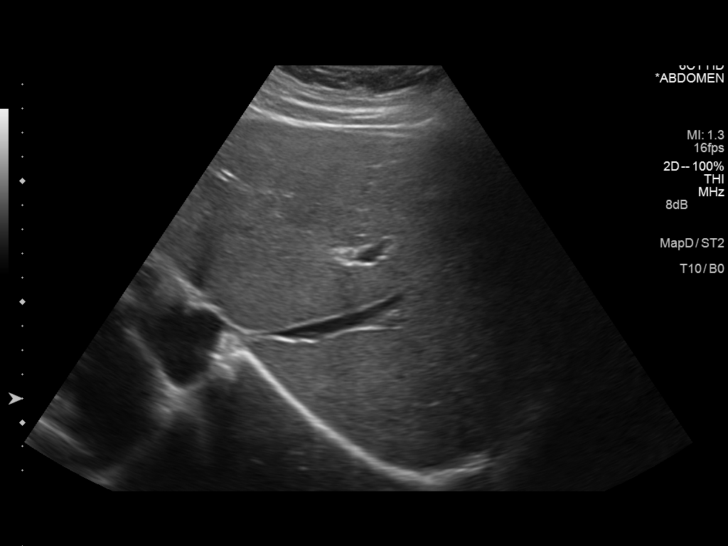
[im 10/59]
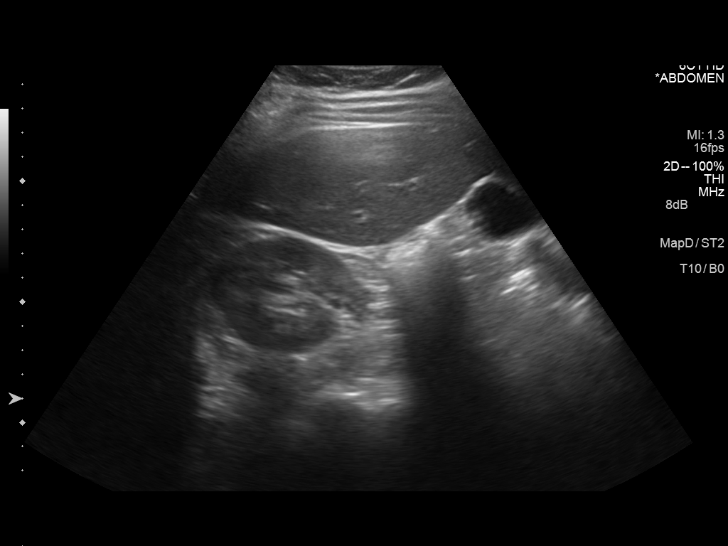
[im 15/59]
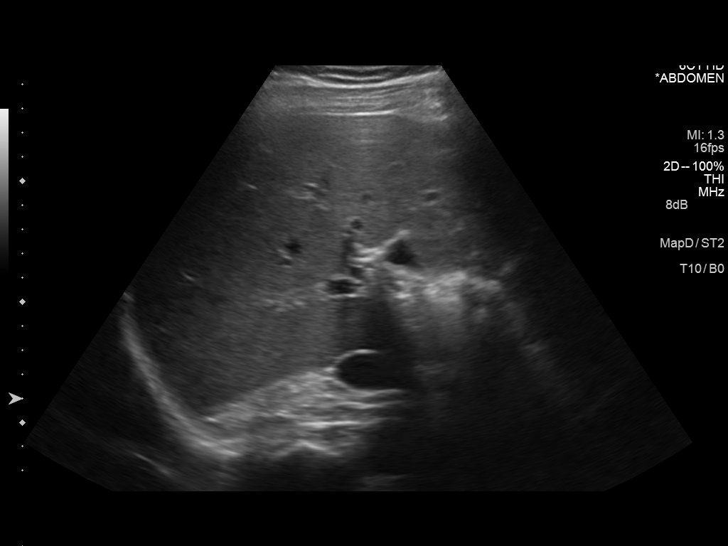
[im 20/59]
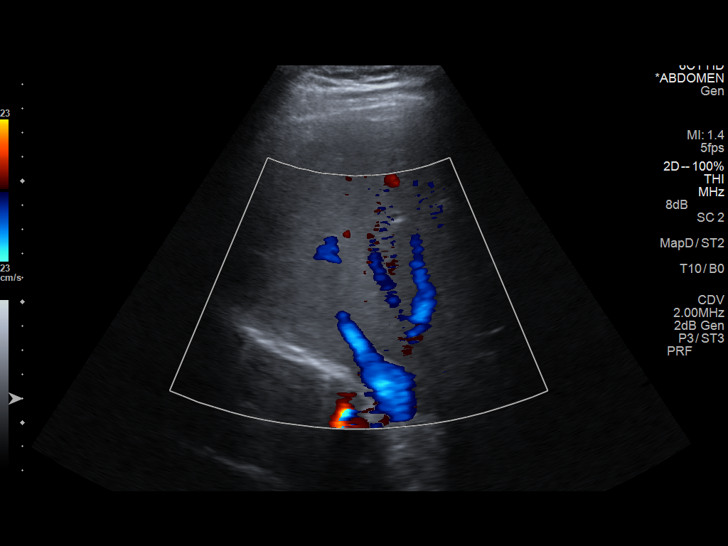
[im 22/59]
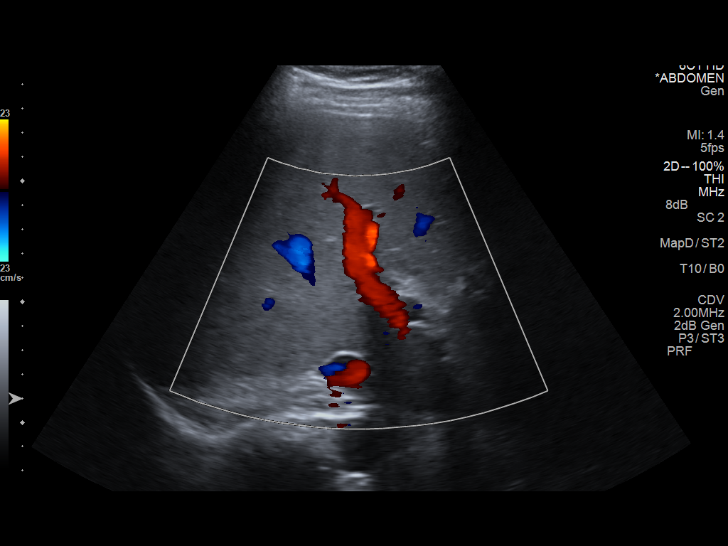
[im 27/59]
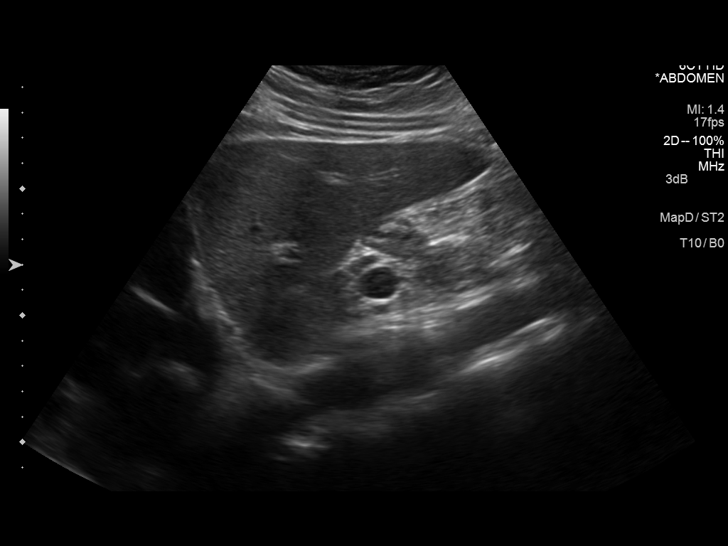
[im 32/59]
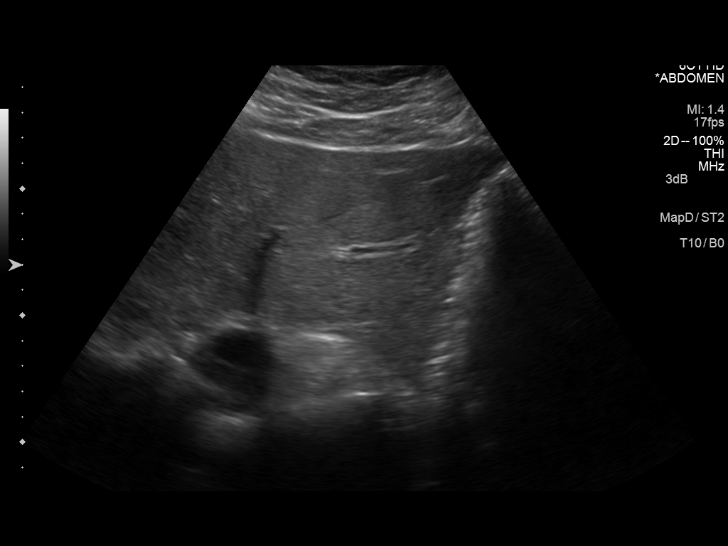
[im 37/59]
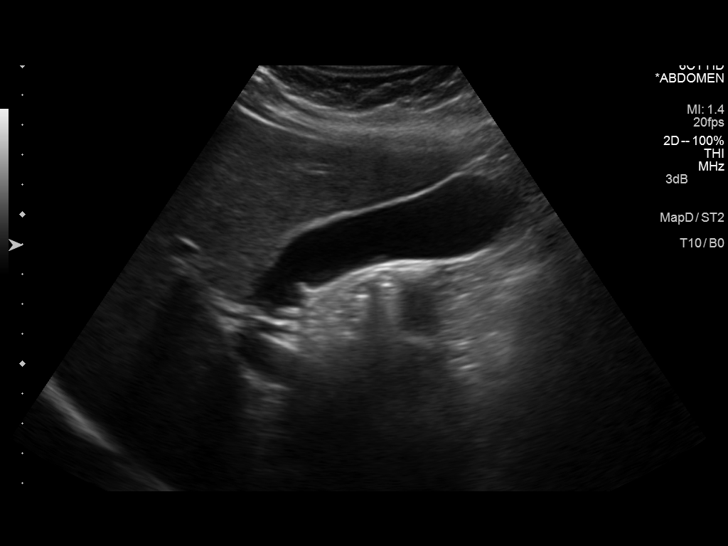
[im 39/59]
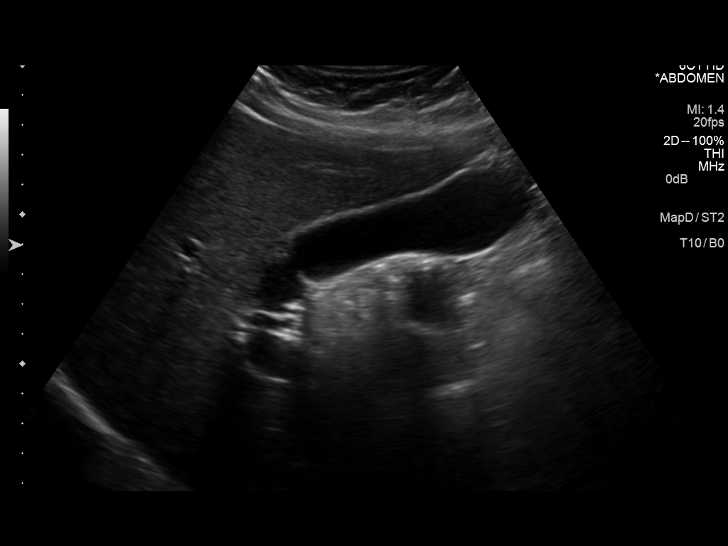
[im 44/59]
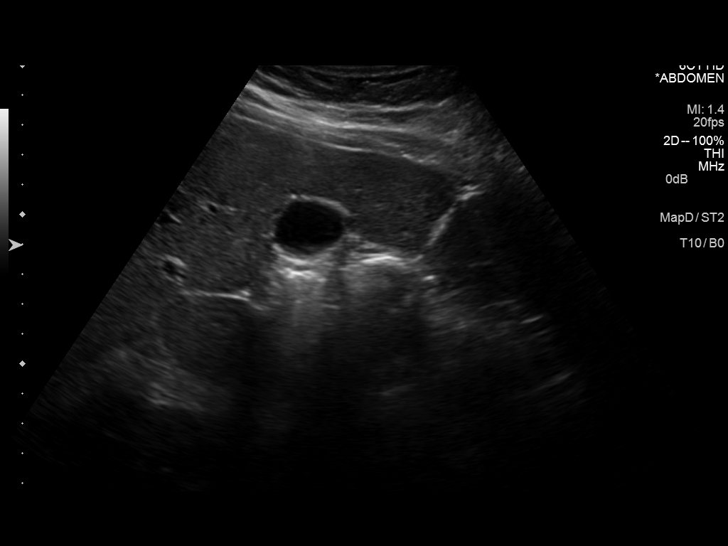
[im 49/59]
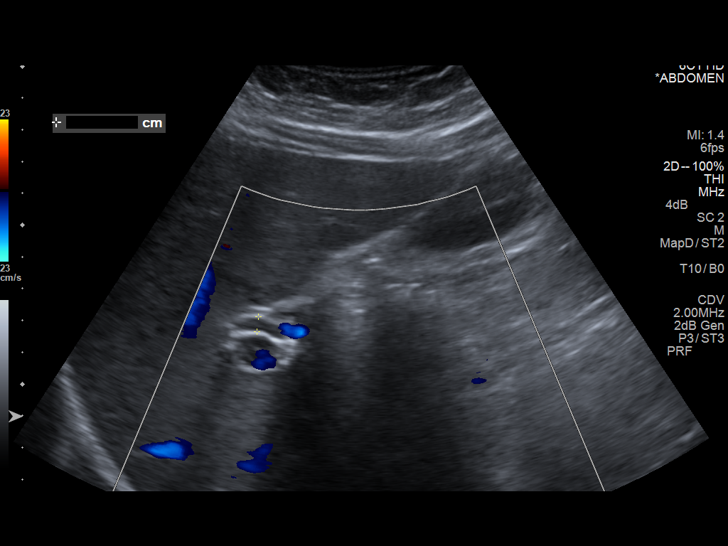
[im 54/59]
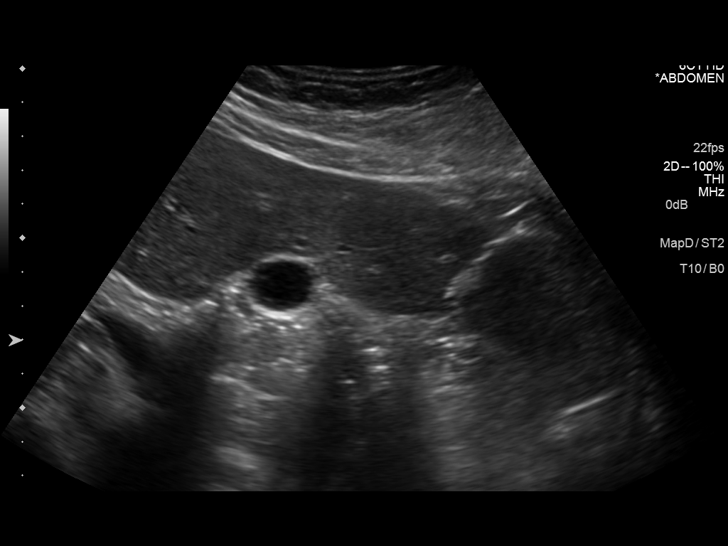
[im 59/59]
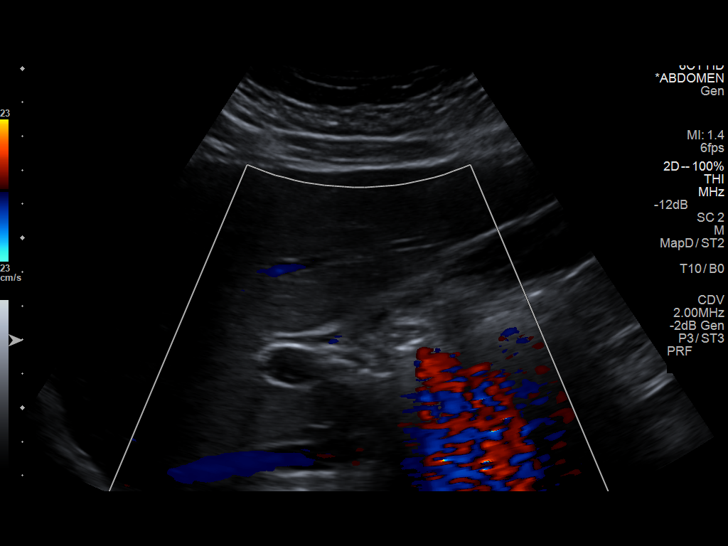

[14 of 25 positions shown; findings below may reference images not displayed]

FINDINGS: Gallbladder:

No gallstones or wall thickening visualized. No sonographic Murphy
sign noted.

Common bile duct:

Diameter: 5 mm

Liver:

No focal lesion identified. Within normal limits in parenchymal
echogenicity.
IMPRESSION: Negative. No sonographic abnormality involving the gallbladder,
common bile duct, or liver.

## 2014-07-19 ENCOUNTER — Telehealth: Payer: Self-pay | Admitting: Nurse Practitioner

## 2014-07-19 ENCOUNTER — Encounter: Payer: Self-pay | Admitting: Nurse Practitioner

## 2014-07-19 NOTE — Telephone Encounter (Signed)
Up front at the desk ready for pick up. Thanks!

## 2014-07-19 NOTE — Telephone Encounter (Signed)
Patient is asking if NP Doss would write him a note to go back to work. Please advise

## 2014-07-19 NOTE — Telephone Encounter (Signed)
Spoke to pt, states he has a new job, but they will now allow him to work (because of disability history) until he has a note from his PCP that states he is able to work. Did not provide any specifics that need to be included, just that he is able to work.

## 2014-07-26 ENCOUNTER — Other Ambulatory Visit: Payer: Medicare Other

## 2014-07-29 ENCOUNTER — Encounter: Payer: Self-pay | Admitting: Endocrinology

## 2014-08-10 ENCOUNTER — Ambulatory Visit: Payer: Medicare Other | Admitting: Endocrinology

## 2014-08-16 ENCOUNTER — Telehealth: Payer: Self-pay

## 2014-08-16 NOTE — Telephone Encounter (Signed)
The patient called and is hoping to get a call back regarding his diabetic monitor.  He stated he has some questions.

## 2014-08-16 NOTE — Telephone Encounter (Signed)
Patient called stating that he will be getting his diabetic supplies from a company. They will send over a request order form today or tomorrow. Patient also wanted to see if he could have his appts with carrie doss and Dr. Howell Rucks on the same day. Appt for carrie doss as been moved to 09/08/14 at 10am per patient's request. Patient aware of changes.

## 2014-08-19 NOTE — Consult Note (Signed)
PATIENT NAME:  Kyle [name], Kyle [name] MR#:  [id number] DATE OF BIRTH:  [birth year]  DATE OF CONSULTATION:  01/24/2013  CONSULTING PHYSICIAN:  Dr. Verdell Carmine.  REASON FOR CONSULTATION: Known coronary artery disease, with coronary artery bypass graft, hypertension, myocardial infarction, diabetes with chest discomfort.   CHIEF COMPLAINT: "I had chest pain."   HISTORY OF PRESENT ILLNESS: This is a 54 year old male with hypertension, hyperlipidemia, coronary artery disease, diabetes, a previous myocardial infarction with acute onset of substernal chest discomfort, dizziness, weakness radiating into his left arm, possibly consistent with cardiovascular disease. He has had a recent myocardial infarction with coronary artery bypass surgery, stable at this time. The patient also has had a new onset of discomfort, more consistent with atypical chest discomfort and musculoskeletal abnormalities rather than an acute myocardial infarction, with a normal troponin and an EKG showing sinus tachycardia, right bundle branch block, left axis deviation, unchanged from before. The patient has had no further evidence of chest discomfort throughout his hospital course. The patient has had a sub-endocardial myocardial infarction with recent hospitalization before his bypass surgery. The remainder review of systems is negative for vision change, ringing in the ears, hearing loss, cough, congestion, heartburn, nausea, vomiting, diarrhea, bloody stools, stomach pain, extremity pain, leg weakness, cramping of the buttocks, known blood clots, headaches, blackouts, dizzy spells, nosebleeds, congestion, trouble swallowing, frequent urination, urination at night, muscle weakness, numbness, anxiety, depression, skin lesions, or skin rashes.   PAST MEDICAL HISTORY: 1.  Coronary artery disease, status post coronary artery bypass graft.  2.  Hypertension.  3.  Hyperlipidemia  4.  Diabetes mellitus.  5.  Abnormal EKG.   FAMILY HISTORY: No  family members with early onset of cardiovascular disease or hypertension.   SOCIAL HISTORY: Currently denies alcohol or tobacco use.   HE HAS ALLERGIES TO MEDICATIONS AS LISTED.  PHYSICAL EXAMINATION: VITAL SIGNS: Blood pressure 126/68 bilaterally, heart rate 72 upright, reclining, and regular.  GENERAL: He is a well-appearing male in no acute distress.  HEENT: No icterus, thyromegaly, ulcers, hemorrhage, or xanthelasma.  CARDIOVASCULAR: Regular rate and rhythm. Normal S1 and S2, without murmur, gallop, or rub. PMI is normal size and placement. Carotid upstroke normal, without bruit. Jugular venous pressure is normal.  LUNGS: Lungs have a few basilar crackles with normal respirations.  ABDOMEN: Soft, nontender, without hepatosplenomegaly or masses. Abdominal aorta is normal size without bruit.  EXTREMITIES: 2+ bilateral pulses in dorsal, pedal, radial and femoral arteries without lower extremity edema, cyanosis, clubbing, or ulcers.  NEUROLOGIC: He is oriented to time, place, and person, with normal mood and affect.   ASSESSMENT: A 54 year old male with hypertension, hyperlipidemia, diabetes, coronary artery disease status post coronary artery bypass graft, with atypical chest discomfort more consistent with musculoskeletal abnormalities from bypass surgery rather than acute myocardial infarction.   RECOMMENDATIONS: 1.  No further cardiac diagnostics at this time due to normal troponin, CK-MB, without evidence of myocardial infarction.  2.  Ambulate and follow for any further significant symptoms.  3.  Beta blocker, aspirin, ACE inhibitor for hypertension control and coronary artery disease.  4.  Diabetes medication management with a goal hemoglobin A1c below 7.  5. Statin therapy with appropriate medication management, with high-intensity cholesterol therapy.  6.  Further treatment options after ambulation.    ____________________________ Corey Skains, MD bjk:dm D: 01/24/2013  08:35:28 ET T: 01/24/2013 10:21:41 ET JOB#: [id number]  cc: Corey Skains, MD, <Dictator> Corey Skains MD ELECTRONICALLY SIGNED 01/26/2013 10:35

## 2014-08-19 NOTE — Discharge Summary (Signed)
PATIENT NAME:  Kyle [name], Kyle [name] MR#:  [id number] DATE OF BIRTH:  1960/12/20  DATE OF ADMISSION:  01/23/2013 DATE OF DISCHARGE:  01/24/2013  ADMITTING DIAGNOSIS: Chest pain.   DISCHARGE DIAGNOSES:  1.  Chest pain, atypical, status post cabg about a month ago. His cardiac enzymes are negative. The patient needs outpatient followup with his CT surgeon and cardiology.  2.  Poorly controlled diabetes. The patient resistant to starting insulin. I increased his metformin and recommended that he follow up outpatient with his [employer]  3.  Hyponatremia, felt to be possibly pseudohyponatremia due to his poorly controlled blood sugars. His sodium normalized with treatment.  4.  Hyperlipidemia.  5.  Thrombocytopenia of unclear chronicity, needs outpatient followup.  6.  Chronic back pain.   CONSULTANTS: Dr. Nehemiah Massed.   PERTINENT LABS AND EVALUATIONS: CPK 56, CK-MB less than 0.5, troponin less than 0.02l. Glucose 527, BUN 12, creatinine 0.98, sodium 131, potassium 3.9, chloride 99, CO2 was 24, calcium 9.1. WBC 7.5, hemoglobin 15.2, platelet count 120. Chest x-ray showed coarse infrahilar lung markings on the right, may reflect subsegmental atelectasis. CT of the chest with contrast showed no evidence of PE. No pericardial effusion. No congestive heart failure. No pneumonia. Evidence of healing mediastinotomy incision. Lipid panel: Total cholesterol 131, triglycerides 184, HDL 24, LDL 70. Hemoglobin A1c 11.3. Magnesium 1.7. EKG showed normal sinus rhythm without any ST-T wave changes.   HOSPITAL COURSE: Please refer to H and P done by the admitting physician. The patient is a 54 year old, African American male with history of recent CABG, who presented with chest pain, which was atypical with negative cardiac enzymes. He was seen by cardiology. They felt that this was very atypical and recommended outpatient followup.  Blood sugars are very poorly controlled. He reports that he has been  very discretionary with his diet and has not been active. I have recommended he needs to be compliant with his diet and outpatient followup up with his primary provider. He is recommended insulin therapy. The patient was resistant to that. I have decreased his metformin at this point and the patient is stable for discharge.   DISCHARGE MEDICATIONS: Gabapentin 300 mg 1 tab p.o. b.i.d., aspirin 325 mg 1 tab p.o. b.i.d., Proventil 2 puffs 4 times a day, hydrocodone 15 mg b.i.d., Glucophage 1000 mg 1 tab p.o. b.i.d., metoprolol 12.5 q.12 hours and simvastatin 40 mg at bedtime.   DIET: Low sodium, low fat, low cholesterol, carbohydrate-controlled diet.   ACTIVITY: As tolerated.   FOLLOWUP: With primary M.D. at the [facility] in 1 to 2 weeks.   NOTE: 35 minutes spent on this discharge.   ____________________________ Lafonda Mosses. Posey Pronto, MD shp:aw D: 01/25/2013 08:50:20 ET T: 01/25/2013 09:17:11 ET JOB#: [id number]  cc: Kriston Mckinnie H. Posey Pronto, MD, <Dictator> Alric Seton MD ELECTRONICALLY SIGNED 01/28/2013 16:20

## 2014-08-19 NOTE — H&P (Signed)
PATIENT NAME:  Kyle [name], Kyle [name] MR#:  [id number] DATE OF BIRTH:  1960-06-18  DATE OF ADMISSION:  01/23/2013  PRIMARY CARE PHYSICIAN:  Nonlocal.  REFERRING PHYSICIAN:  Dr. Jimmye Norman.    CHIEF COMPLAINT: Chest pain since last night.   HISTORY OF PRESENT ILLNESS: A 54 year old African American male with a history of CAD, MI 1 month ago, status post CABG 1 month ago, diabetes, who presented to the ED with  chest pain since last night. The patient is alert, awake, oriented, in no acute distress. The patient said he started to have chest pain in the middle part of the chest and the left side which is sharp, no radiation, 8 out of 10, exacerbated by breathing.  In addition, the patient complains of nausea slightly, headache and dizziness. The patient denies any cough, phlegm or shortness of breath He denies any orthopnea, nocturnal dyspnea or leg edema. The patient's blood sugar was 527 in the ED, he was treated with insulin. The patient said he is taking aspirin, metformin, glipizide, gabapentin and hydrocodone at home but he is not taking any Plavix or cholesterol medication.    MEDICATIONS: The patient just moved to this area about less than 1 month ago. He has no primary care physician.   PAST MEDICAL HISTORY: CAD, MI 1 month ago, diabetes 1 year, chronic back pain.   PAST SURGICAL HISTORY: CABG 1 month ago.   SOCIAL HISTORY: Denies any smoking, alcohol drinking or illicit drugs.   FAMILY HISTORY: Parents had a heart attack and also diabetes. No hypertension, no CVA.   ALLERGIES: AMOXICILLIN AND METHADONE.   HOME MEDICATIONS: Metformin 1000 mg p.o. b.i.d., aspirin, glipizide 1 mg p.o. daily, gabapentin 300 mg p.o. b.i.d., hydrocodone.   REVIEW OF SYSTEMS CONSTITUTIONAL: The patient denies any fever or chills, has a slight headache and dizziness. No weakness.  EYES: No double vision or blurry vision.  EARS, NOSE, THROAT: No postnasal drip, slurred speech or dysphagia.  CARDIOVASCULAR: Positive  for chest pain bit no palpitations, orthopnea or nocturnal dyspnea. No leg edema.  PULMONARY: No cough, sputum, shortness of breath or hemoptysis.  GASTROINTESTINAL: No abdominal pain but had some nausea. No vomiting, melena or bloody stool.  GENITOURINARY: No dysuria, hematuria or incontinence.  SKIN: No rash or jaundice.  NEUROLOGY: No syncope, loss of consciousness or seizure.  HEMATOLOGIC: No easy bruising or bleeding.  ENDOCRINE: No polyuria, polydipsia, heat or cold intolerance.    PHYSICAL EXAMINATION  VITAL SIGNS: Temperature 98.5, blood pressure 119/86, pulse 85, O2 saturation 95% on room air.  GENERAL: The patient is alert, awake, oriented, in no acute distress.  HEENT: Pupils round, equal, reactive to light and accommodation. Moist oral mucosa. Clear oropharynx.  NECK: Supple. No JVD or carotid bruits. No lymphadenopathy. No thyromegaly.  CARDIOVASCULAR: S1, S2, regular rate and rhythm. No murmurs, gallops.  PULMONARY: Bilateral air entry. No wheezing or rales. No use of accessory muscles to breathe. There is tenderness on palpation on the chest all over the chest.  ABDOMEN: Soft. No distention but has diffuse tenderness. No rigidity. No rebound. Bowel sounds present. No organomegaly.  EXTREMITIES: No edema, clubbing or cyanosis. No calf tenderness. Strong bilateral pedal pulses.  SKIN: No rash or jaundice.  NEUROLOGY: A and O x 3. No focal deficit. Power 5 out of 5. Sensation intact.   LABORATORY AND IMAGING DATA 1.  CAT scan with contrast did not show any PE.   2.  Venous ABG showed pH of 7.36, pCO2 39.  3.  Chest x-ray showed new coarse infrahilar lung markings on the right, may reflect a subsegmental atelectasis but no CHF.   4.  CBC showed WBC 7.5, hemoglobin 15.2, platelets 120.   5.  Glucose 527, BUN 12, creatinine 0.98, sodium 131, potassium 3.9, chloride 199.  6.  Troponin less than 0.02, CK 56, CK-MB less than 0.5.  7.  EKG shows sinus tachycardia at 118 bpm with  possible left atrial enlargement, right bundle branch block and left anterior fascicular block.   IMPRESSIONS 1.  Atypical chest pain.  2.  Coronary artery disease.  3.  Hyperglycemia.  4.  Hyponatremia, possibly due to hyperglycemia.  5.  Uncontrolled diabetes.  6.  Thrombocytopenia.  7.  Chronic back pain.   PLAN OF TREATMENT 1.  The patient will be placed for observation. We will continue telemonitor. We will give  aspirin, Plavix, Zocor, and Lopressor.  2.  We will follow up troponin level, lipid panel, TSH, and we will get a cardiology consult.  3.  Need to get medical records from [facility].  4.  For diabetes, we will start a sliding scale but hold metformin and glipizide and check hemoglobin A1c.   5.  For thrombocytopenia, which is possibly chronic, we will follow up platelet level.  6.  I discussed the patient's condition and plan of treatment with the patient.  The patient is full code.   TIME SPENT: About 50 minutes.    ____________________________ Demetrios Loll, MD qc:cs D: 01/23/2013 15:34:00 ET T: 01/23/2013 15:47:42 ET JOB#: [id number]  cc: Demetrios Loll, MD, <Dictator> Demetrios Loll MD ELECTRONICALLY SIGNED 01/24/2013 11:05

## 2014-09-05 ENCOUNTER — Ambulatory Visit: Payer: Medicare Other | Admitting: Nurse Practitioner

## 2014-09-06 ENCOUNTER — Ambulatory Visit: Payer: Medicare Other | Admitting: Nurse Practitioner

## 2014-09-08 ENCOUNTER — Ambulatory Visit (INDEPENDENT_AMBULATORY_CARE_PROVIDER_SITE_OTHER): Payer: Medicare Other | Admitting: Endocrinology

## 2014-09-08 ENCOUNTER — Encounter: Payer: Self-pay | Admitting: Nurse Practitioner

## 2014-09-08 ENCOUNTER — Encounter: Payer: Self-pay | Admitting: Endocrinology

## 2014-09-08 ENCOUNTER — Ambulatory Visit (INDEPENDENT_AMBULATORY_CARE_PROVIDER_SITE_OTHER): Payer: Medicare Other | Admitting: Nurse Practitioner

## 2014-09-08 VITALS — BP 130/78 | HR 83 | Temp 98.1°F | Resp 14 | Ht 67.0 in | Wt 169.4 lb

## 2014-09-08 DIAGNOSIS — B182 Chronic viral hepatitis C: Secondary | ICD-10-CM

## 2014-09-08 DIAGNOSIS — K1379 Other lesions of oral mucosa: Secondary | ICD-10-CM

## 2014-09-08 DIAGNOSIS — I10 Essential (primary) hypertension: Secondary | ICD-10-CM

## 2014-09-08 DIAGNOSIS — Z91148 Patient's other noncompliance with medication regimen for other reason: Secondary | ICD-10-CM

## 2014-09-08 DIAGNOSIS — Z9114 Patient's other noncompliance with medication regimen: Secondary | ICD-10-CM

## 2014-09-08 DIAGNOSIS — E1165 Type 2 diabetes mellitus with hyperglycemia: Secondary | ICD-10-CM | POA: Diagnosis not present

## 2014-09-08 DIAGNOSIS — IMO0002 Reserved for concepts with insufficient information to code with codable children: Secondary | ICD-10-CM

## 2014-09-08 DIAGNOSIS — M542 Cervicalgia: Secondary | ICD-10-CM | POA: Diagnosis not present

## 2014-09-08 LAB — BASIC METABOLIC PANEL
BUN: 8 mg/dL (ref 6–23)
CHLORIDE: 104 meq/L (ref 96–112)
CO2: 27 meq/L (ref 19–32)
Calcium: 9.6 mg/dL (ref 8.4–10.5)
Creatinine, Ser: 0.64 mg/dL (ref 0.40–1.50)
GFR: 166.53 mL/min (ref >60.00)
Glucose, Bld: 198 mg/dL — ABNORMAL HIGH (ref 70–99)
Potassium: 4.1 mEq/L (ref 3.5–5.1)
Sodium: 136 mEq/L (ref 135–145)

## 2014-09-08 MED ORDER — SIMVASTATIN 40 MG PO TABS: 40.0000 mg | ORAL_TABLET | Freq: Every day | ORAL | Status: DC

## 2014-09-08 MED ORDER — OXYCODONE HCL 10 MG PO TABS: 10.0000 mg | ORAL_TABLET | Freq: Every day | ORAL | Status: DC

## 2014-09-08 MED ORDER — METHOCARBAMOL 750 MG PO TABS: 750.0000 mg | ORAL_TABLET | Freq: Three times a day (TID) | ORAL | Status: DC | PRN

## 2014-09-08 MED ORDER — ALBUTEROL SULFATE HFA 108 (90 BASE) MCG/ACT IN AERS: 2.0000 | INHALATION_SPRAY | RESPIRATORY_TRACT | Status: DC | PRN

## 2014-09-08 NOTE — Assessment & Plan Note (Signed)
BP at target today. Recent urine MA negative.

## 2014-09-08 NOTE — Assessment & Plan Note (Signed)
Discussed that he needs to be taking his medications as directed - so that we could work with him to improve his sugars.   Discussed that repeated non compliance might be a reason for dismissal from the practice

## 2014-09-08 NOTE — Assessment & Plan Note (Signed)
He has again forgotten to bring his meter to this appointment. He was reminded to bring meter to all future appointments and check sugars 2 x daily . Stop alcohol use all together.     Need more data to adjust his sugars. Suspect that he is going to need more insulin, but hesistent on making a change based on his recall readings.    Continue current metformin for now. Continue current 70/30 insulin at 30 units am and 20 units in the evening.  Discussed about adding Invokana ( he has not started due to fear of side effects). Discussed risk profile and side effects. He is agreeable and promises to take his new medication. Will recheck GFR in 2 weeks at time of next appt.

## 2014-09-08 NOTE — Patient Instructions (Signed)
Check sugars. Bring meter.  Take your insulin every time - no changes to the dose as yet Continue current metformin Start Invokana 100 mg daily. Stay hydrated with water.  Please come back for a follow-up appointment in 2 weeks

## 2014-09-08 NOTE — Progress Notes (Signed)
Pre visit review using our clinic review tool, if applicable. No additional management support is needed unless otherwise documented below in the visit note. 

## 2014-09-08 NOTE — Progress Notes (Signed)
   Subjective:    Patient ID: Kyle [name], male    DOB: Jan 18, 1961, 54 y.o.   MRN: [id number]  HPI  Kyle [name] is a 54 yo male with a follow up for cervicalgia and throat drainage.   1) Neck pain- not feeling any better, off of work past few days worsening. Reports he forgot about the x-ray.   2) DM- "Doing pretty good"  Did not bring meter or numbers  3) Blisters x 1 month- intermittent   Trying vasaline, chapstick- not helpful   Painful when opening mouth  Still drinking alcohol. No straight answer when asked how much.   Review of Systems  Constitutional: Negative for fever, chills, diaphoresis and fatigue.  Eyes: Negative for visual disturbance.  Respiratory: Negative for chest tightness, shortness of breath and wheezing.   Cardiovascular: Negative for chest pain, palpitations and leg swelling.  Gastrointestinal: Negative for nausea, vomiting and diarrhea.  Musculoskeletal: Positive for neck pain.  Skin: Negative for rash.       "Blisters" around corners of mouth  Neurological: Negative for dizziness, weakness and numbness.  Psychiatric/Behavioral: The patient is not nervous/anxious.        Objective:   Physical Exam  Constitutional: He is oriented to person, place, and time. He appears well-developed and well-nourished. No distress.  BP 130/78 mmHg  Pulse 83  Temp(Src) 98.1 F (36.7 C) (Oral)  Resp 14  Ht 5\' 7"  (1.702 m)  Wt 169 lb 6.4 oz (76.839 kg)  BMI 26.53 kg/m2  SpO2 97%   HENT:  Head: Normocephalic and atraumatic.  Right Ear: External ear normal.  Left Ear: External ear normal.  Mouth/Throat: No oropharyngeal exudate.  Eyes: EOM are normal. Pupils are equal, round, and reactive to light. Right eye exhibits no discharge. Left eye exhibits no discharge. No scleral icterus.  Cardiovascular: Normal rate, regular rhythm and normal heart sounds.  Exam reveals no gallop and no friction rub.   No murmur heard. Pulmonary/Chest: Effort normal and breath  sounds normal. No respiratory distress. He has no wheezes. He has no rales. He exhibits no tenderness.  Neurological: He is alert and oriented to person, place, and time.  Skin: Skin is warm and dry. No rash noted. He is not diaphoretic.  Small white dots on the corners of his mouth  Psychiatric: He has a normal mood and affect. His behavior is normal. Judgment and thought content normal.      Assessment & Plan:

## 2014-09-08 NOTE — Progress Notes (Signed)
Reason for visit-  Kyle Mcfarland is a 54 y.o.-year-old male, for  Follow up of Type 2 diabetes, uncontrolled, without complications. Associated problem of CAD. Last visit March 2016. Didn't come for April visit.    HPI- Patient has been diagnosed with diabetes in 2014. Recalls being initially on lifestyle modifications.   Started on metformin shortly after. he has been insulin since about 2014.  Has been uncontrolled this year atleast due to non compliance with drug regimen  *Patient reports struggling with pain in his neck and back, and reports that he has been turning to alcohol recently for pain. Now pain better and down to 1-2 drinks weekly ( this is decreased for him) . Prior had been sober for preceding 7 years Is also a smoker. Didn't drink today morning>>now cut back significantly on alcohol intake after last visits * mood and pain a little better, reports feeling well overall   Pt is currently on a regimen of: - Metformin 1000 mg po bid ( taking it as directed) - Novolin 70/30 at 30 units am and 20 units supper ( reports missing 1-2 doses only per week) - Invokana 100 mg daily ( supposed to start March 2016,but didn't start due to fear of side effects)   Last hemoglobin A1c was: Lab Results  Component Value Date   HGBA1C 10.7* 07/12/2014   HGBA1C 13.0* 04/11/2014   HGBA1C 12.6* 01/05/2014     Pt checks his sugars  Every other day ( by report). Uses one touch ultra2 glucometer. By recall, they are- :  PREMEAL Breakfast Lunch Dinner Bedtime Overall  Glucose range:     140-260  Mean/median:        POST-MEAL PC Breakfast PC Lunch PC Dinner  Glucose range:     Mean/median:      Last hospital stay 2015 for hyperglycemia.   Hypoglycemia-  No lows. Lowest sugar was n/a; he has hypoglycemia awareness at 70.   Dietary habits- eats 1-2 times daily. Tries to limit carbs. Loves bread. White bread>>wheat. Cut back on alcohol intake. Cut out all sodas. 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 No known neuropathy. Is on neurontin for back pain Associated history - hasCAD . No prior stroke. No hypothyroidism. his last TSH was  Lab Results  Component Value Date   TSH 1.34 02/24/2013    Hyperlipidemia-  his last set of lipids were- Currently on zocor 40 mg daily ( reports taking it daily now). Tolerating well.   Lab Results  Component Value Date   CHOL 135 11/24/2013   HDL 34.50* 11/24/2013   LDLDIRECT 74.1 11/24/2013   TRIG 238.0* 11/24/2013   CHOLHDL 4 11/24/2013   Lab Results  Component Value Date   AST 76*  06/09/2014   AST 28 01/11/2014   ALT 109* 06/09/2014   ALT 69* 01/11/2014    Blood Pressure/HTN- Patient's blood pressure is well controlled today.   I have reviewed the patient's past medical history, medications and allergies.    Current Outpatient Prescriptions on File Prior to Visit  Medication Sig Dispense Refill  . aspirin 325 MG tablet Take 1 tablet (325 mg total) by mouth daily. 30 tablet 5  . canagliflozin (INVOKANA) 100 MG TABS tablet Take 1 tablet (100 mg total) by mouth daily. 30 tablet 3  . gabapentin (NEURONTIN) 800 MG tablet Take 800 mg by mouth 3 (three) times daily.     Marland Kitchen glucose blood test strip OneTouch ultra mini test strips. Use 4 times daily. Dx 250.0 100 each 5  .  hydrOXYzine (ATARAX/VISTARIL) 25 MG tablet Take 1 tablet (25 mg total) by mouth every 8 (eight) hours as needed for itching. 30 tablet 1  . insulin NPH-regular Human (NOVOLIN 70/30) (70-30) 100 UNIT/ML injection 30 units in the AM and 20 units with evening meal    . Insulin Syringes, Disposable, U-100 1 ML MISC To inject 70/30 bid 100 each 6  . Lancets (ACCU-CHEK SOFT TOUCH) lancets Use as instructed 100 each 5  . LANCETS ULTRA FINE MISC To check blood sugar four times daily 100 each 6  . metFORMIN (GLUCOPHAGE) 1000 MG tablet Take 1 tablet (1,000 mg total) by mouth 2 (two) times daily with a meal. 60 tablet 5  . metoprolol tartrate (LOPRESSOR) 25 MG tablet Take 1.5 tablets (37.5 mg total) by mouth 2 (two) times daily. 135 tablet 3  . omeprazole (PRILOSEC) 20 MG capsule Take 1 capsule (20 mg total) by mouth daily. 30 capsule 3  . sildenafil (VIAGRA) 100 MG tablet Take 100 mg by mouth daily as needed for erectile dysfunction.     No current facility-administered medications on file prior to visit.   Allergies  Allergen Reactions  . Amoxicillin   . Flexeril [Cyclobenzaprine]     rash  . Methadone   . Tramadol   . Trazodone And Nefazodone      Review of Systems- [ x ]  Complains of    [  ]  denies [  ] Recent weight change [  ]  Fatigue [ x ] polydipsia [ x ] polyuria [ x ]  nocturia [  ]  vision difficulty [ x ] chest pain [ x ] shortness of breath [  ] leg swelling [  ] cough [  ] nausea/vomiting [  ] diarrhea [  ] constipation [  ] abdominal pain [  ]  tingling/numbness in extremities [  ]  concern with feet ( wounds/sores)  occ chest pain, with over-exertion, dull, central, sob, goes away for 4-5 sec- he has mentioned this to PCP in the past, no active chest pain today  PE: BP 130/78 mmHg  Pulse 83  Temp(Src) 98.1 F (36.7 C) (Oral)  Resp 14  Ht 5\' 7"  (1.702 m)  Wt 169 lb 6.4 oz (76.839 kg)  BMI 26.53 kg/m2  SpO2 97% Wt Readings from Last 3 Encounters:  09/08/14 169 lb 6.4  oz (76.839 kg)  09/08/14 169 lb 6.4 oz (76.839 kg)  07/12/14 173 lb 8 oz (78.699 kg)   Exam: deferred  ASSESSMENT AND PLAN: Problem List Items Addressed This Visit      Cardiovascular and Mediastinum   HTN (hypertension)    BP at target today. Recent urine MA negative.  Other   Diabetes mellitus type 2, uncontrolled - Primary    He has again forgotten to bring his meter to this appointment. He was reminded to bring meter to all future appointments and check sugars 2 x daily . Stop alcohol use all together.     Need more data to adjust his sugars. Suspect that he is going to need more insulin, but hesistent on making a change based on his recall readings.    Continue current metformin for now. Continue current 70/30 insulin at 30 units am and 20 units in the evening.  Discussed about adding Invokana ( he has not started due to fear of side effects). Discussed risk profile and side effects. He is agreeable and promises to take his new medication. Will recheck GFR in 2 weeks at time of next appt.                Non compliance w medication regimen    Discussed that he needs to be taking his medications as directed - so that we could work with him to improve his sugars.   Discussed that repeated non compliance might be a reason for dismissal from the practice         - Return to clinic in 2 weeks with sugar log/meter    [facility] 09/08/2014 11:49 AM

## 2014-09-08 NOTE — Patient Instructions (Addendum)
Please visit the lab today before leaving.   May sure you go to [facility] entrance- talk to the receptionist to get to imaging for your neck x-ray. Please get within the month or we will not be able to write for anymore pain medications.   Follow up in 1 month for neck pain.

## 2014-09-11 ENCOUNTER — Encounter: Payer: Self-pay | Admitting: *Deleted

## 2014-09-11 ENCOUNTER — Emergency Department
Admission: EM | Admit: 2014-09-11 | Discharge: 2014-09-11 | Disposition: A | Discharge status: Home / Self Care | Payer: Medicare Other | Attending: Emergency Medicine | Admitting: Emergency Medicine

## 2014-09-11 ENCOUNTER — Emergency Department: Payer: Medicare Other

## 2014-09-11 DIAGNOSIS — Z720 Tobacco use: Secondary | ICD-10-CM | POA: Insufficient documentation

## 2014-09-11 DIAGNOSIS — Z794 Long term (current) use of insulin: Secondary | ICD-10-CM | POA: Diagnosis not present

## 2014-09-11 DIAGNOSIS — Z880 Allergy status to penicillin: Secondary | ICD-10-CM | POA: Diagnosis not present

## 2014-09-11 DIAGNOSIS — M546 Pain in thoracic spine: Secondary | ICD-10-CM | POA: Diagnosis not present

## 2014-09-11 DIAGNOSIS — Z79899 Other long term (current) drug therapy: Secondary | ICD-10-CM | POA: Insufficient documentation

## 2014-09-11 DIAGNOSIS — E119 Type 2 diabetes mellitus without complications: Secondary | ICD-10-CM | POA: Diagnosis not present

## 2014-09-11 DIAGNOSIS — M542 Cervicalgia: Secondary | ICD-10-CM | POA: Diagnosis not present

## 2014-09-11 DIAGNOSIS — Z7982 Long term (current) use of aspirin: Secondary | ICD-10-CM | POA: Diagnosis not present

## 2014-09-11 DIAGNOSIS — I10 Essential (primary) hypertension: Secondary | ICD-10-CM | POA: Insufficient documentation

## 2014-09-11 DIAGNOSIS — M549 Dorsalgia, unspecified: Secondary | ICD-10-CM

## 2014-09-11 DIAGNOSIS — G8929 Other chronic pain: Secondary | ICD-10-CM | POA: Diagnosis not present

## 2014-09-11 NOTE — ED Notes (Signed)
Patient states he has chronic neck pain that has worsened in the last few days. Patient denies numbness in fingertips.

## 2014-09-11 NOTE — ED Notes (Signed)
NAD noted at time of D/C. Pt denies questions or concerns. Pt ambulatory to the lobby at this time.  

## 2014-09-11 NOTE — ED Provider Notes (Signed)
[facility] Emergency Department Provider Note  ____________________________________________  Time seen: 1416 I have reviewed the triage vital signs and the nursing notes.   HISTORY  Chief Complaint Neck Pain    HPI Kyle [name] is a 54 y.o. male comes to the emergency room with the complaint of neck and upper back pain for approximately 3 months in duration this time. He states that he was sent from [facility] in [place] to have x-rays done. He also has history of chronic neck and back pain since 1980. Currently he is taking oxycodone for pain. There's been no recent injury or change in his pain. He denies any paresthesias into his arms. It is been no bowel or bladder problems. Low back pain isn't unchanged from his prior visits. He rates his pain as a 7 out of 10 today.   Past Medical History  Diagnosis Date  . Asthma   . Diabetes mellitus without complication   . COPD (chronic obstructive pulmonary disease)   . Fainting spell   . GERD (gastroesophageal reflux disease)   . Allergy   . Hyperlipidemia   . Hypertension   . Urine incontinence   . CAD (coronary artery disease)   . 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 Chronic back pain 1981  . DDD (degenerative disc disease)     Patient Active Problem List   Diagnosis Date Noted  . Cervicalgia 07/05/2014  . Acute bacterial rhinosinusitis 06/15/2014  . Non compliance w medication regimen 06/07/2014  . Chest tightness 06/07/2014  . Tobacco abuse counseling 01/05/2014  . Vitamin D deficiency 01/05/2014  . Transaminitis 01/05/2014  . Temporary low platelet count 01/05/2014  . Diabetes mellitus type 2, uncontrolled 01/05/2014  . Back pain 10/01/2013  . Need for prophylactic vaccination with tetanus-diphtheria (TD) 10/01/2013  . Penile lesion 10/01/2013  . HTN (hypertension) 08/29/2013  . HLD (hyperlipidemia) 08/29/2013  . Callus of foot 08/29/2013  . Blurred vision 08/29/2013  . Itching 08/29/2013  .  Exacerbation of chronic back pain 06/07/2013  . Hyperlipidemia 03/02/2013  . Former smoker 03/02/2013  . CAD (coronary artery disease) of artery bypass graft 02/27/2013  . Routine general medical examination at a health care facility 02/24/2013  . Hepatitis C 02/24/2013  . Diabetes type 2, uncontrolled 02/24/2013    Past Surgical History  Procedure Laterality Date  . 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 Cardiac catheterization  11/2012    [facility]    Current Outpatient Rx  Name  Route  Sig  Dispense  Refill  . albuterol (PROVENTIL HFA;VENTOLIN HFA) 108 (90 BASE) MCG/ACT inhaler   Inhalation   Inhale 2 puffs into the lungs as needed.   1 Inhaler   2   . aspirin 325 MG tablet   Oral   Take 1 tablet (325 mg total) by mouth daily.   30 tablet   5   . canagliflozin (INVOKANA) 100 MG TABS tablet   Oral   Take 1 tablet (100 mg total) by mouth daily.   30 tablet   3   . gabapentin (NEURONTIN) 800 MG tablet   Oral   Take 800 mg by mouth 3 (three) times daily.          Marland Kitchen glucose blood test strip      OneTouch ultra mini test strips. Use 4 times daily. Dx 250.0   100 each   5   . hydrOXYzine (  ATARAX/VISTARIL) 25 MG tablet   Oral   Take 1 tablet (25 mg total) by mouth every 8 (eight) hours as needed for itching.   30 tablet   1   . insulin NPH-regular Human (NOVOLIN 70/30) (70-30) 100 UNIT/ML injection      30 units in the AM and 20 units with evening meal         . Insulin Syringes, Disposable, U-100 1 ML MISC      To inject 70/30 bid   100 each   6   . Lancets (ACCU-CHEK SOFT TOUCH) lancets      Use as instructed   100 each   5   . LANCETS ULTRA FINE MISC      To check blood sugar four times daily   100 each   6   . metFORMIN (GLUCOPHAGE) 1000 MG tablet   Oral   Take 1 tablet (1,000 mg total) by mouth 2 (two) times daily with a meal.   60 tablet   5   . methocarbamol (ROBAXIN) 750 MG tablet   Oral   Take 1 tablet  (750 mg total) by mouth 3 (three) times daily as needed for muscle spasms.   45 tablet   1   . metoprolol tartrate (LOPRESSOR) 25 MG tablet   Oral   Take 1.5 tablets (37.5 mg total) by mouth 2 (two) times daily.   135 tablet   3   . omeprazole (PRILOSEC) 20 MG capsule   Oral   Take 1 capsule (20 mg total) by mouth daily.   30 capsule   3   . Oxycodone HCl 10 MG TABS   Oral   Take 1 tablet (10 mg total) by mouth daily. Fill on or after May 12th, 2016   30 tablet   0   . sildenafil (VIAGRA) 100 MG tablet   Oral   Take 100 mg by mouth daily as needed for erectile dysfunction.         . simvastatin (ZOCOR) 40 MG tablet   Oral   Take 1 tablet (40 mg total) by mouth at bedtime.   90 tablet   3     Allergies Amoxicillin; Flexeril; Methadone; Tramadol; and Trazodone and nefazodone  Family History  Problem Relation Age of Onset  . Heart disease Mother   . Heart disease Father   . Hypertension Father   . Diabetes Sister   . Cancer Sister     skin cancer  . Diabetes Other   . Heart disease Other     Social History History  Substance Use Topics  . Smoking status: Current Every Day Smoker -- 35 years    Types: Cigarettes    Last Attempt to Quit: 11/24/2012  . Smokeless tobacco: Never Used  . Alcohol Use: Yes     Comment: occasional    Review of Systems Constitutional: No fever/chills Eyes: No visual changes. Cardiovascular: Denies chest pain. Respiratory: Denies shortness of breath. Gastrointestinal: No abdominal pain.  No nausea, no vomiting.  No diarrhea.  No constipation. Genitourinary: Negative for dysuria. Musculoskeletal: Positive for chronic back pain and upper back pain Skin: Negative for rash. Neurological: Negative for headaches, focal weakness or numbness.  10-point ROS otherwise negative.  ____________________________________________   PHYSICAL EXAM:  VITAL SIGNS: ED Triage Vitals  Enc Vitals Group     BP 09/11/14 1237 154/94 mmHg      Pulse Rate 09/11/14 1237 85     Resp --  Temp 09/11/14 1237 98.6 F (37 C)     Temp Source 09/11/14 1237 Oral     SpO2 09/11/14 1237 98 %     Weight 09/11/14 1237 172 lb (78.019 kg)     Height 09/11/14 1237 5\' 7"  (1.702 m)     Head Cir --      Peak Flow --      Pain Score 09/11/14 1238 7     Pain Loc --      Pain Edu? --      Excl. in [place]? --     Constitutional: Alert and oriented. Well appearing and in no acute distress. Eyes: Conjunctivae are normal. PERRL. EOMI. Head: Atraumatic. Nose: No congestion/rhinnorhea. Mouth/Throat: Mucous membranes are moist.  Oropharynx non-erythematous. Neck: No stridor.  Mild tenderness on palpation of cervical spine. There is also some mild tenderness on palpation of the upper thoracic spine. No deformity is noted.  range of motion is slightly restricted due to pain Hematological/Lymphatic/Immunilogical: No cervical lymphadenopathy. Cardiovascular: Normal rate, regular rhythm. Grossly normal heart sounds.  Good peripheral circulation. Respiratory: Normal respiratory effort.  No retractions. Lungs CTAB. Gastrointestinal: Soft and nontender. No distention. No abdominal bruits. No CVA tenderness. Genitourinary: Deferred Musculoskeletal: No lower extremity tenderness nor edema.  No joint effusions. Remainder of exam is under neck. Neurologic:  Normal speech and language. No gross focal neurologic deficits are appreciated. Speech is normal. No gait instability. Skin:  Skin is warm, dry and intact. No rash noted. Psychiatric: Mood and affect are normal. Speech and behavior are normal.  ____________________________________________   LABS (all labs ordered are listed, but only abnormal results are displayed)  Labs Reviewed - No data to display ____________________________________________  EKG  Deferred ____________________________________________  RADIOLOGY  Radiologist report and cervical spine shows degenerative joint changes at C4-C5.  Mild never joint space between C5 and C6. No acute fracture or dislocation. Thoracic spine no acute findings, mild spondylosis ____________________________________________   PROCEDURES  Procedure(s) performed: None  Critical Care performed: No  ____________________________________________   INITIAL IMPRESSION / ASSESSMENT AND PLAN / ED COURSE  Pertinent labs & imaging results that were available during my care of the patient were reviewed by me and considered in my medical decision making (see chart for details).  Patient was given information about his x-rays. He will follow up with the [facility] for recommendations on seeing orthopedist. He is to continue on his present medication. ____________________________________________   FINAL CLINICAL IMPRESSION(S) / ED DIAGNOSES  Final diagnoses:  Chronic neck and back pain      Johnn Hai, PA-C 09/11/14 1542  Nance Pear, MD 09/12/14 [phone]

## 2014-09-21 ENCOUNTER — Encounter: Payer: Self-pay | Admitting: Endocrinology

## 2014-09-21 ENCOUNTER — Ambulatory Visit (INDEPENDENT_AMBULATORY_CARE_PROVIDER_SITE_OTHER): Payer: Medicare Other | Admitting: Endocrinology

## 2014-09-21 VITALS — BP 110/74 | HR 91 | Resp 12 | Ht 67.0 in | Wt 168.1 lb

## 2014-09-21 DIAGNOSIS — Z9114 Patient's other noncompliance with medication regimen: Secondary | ICD-10-CM | POA: Diagnosis not present

## 2014-09-21 DIAGNOSIS — E1165 Type 2 diabetes mellitus with hyperglycemia: Secondary | ICD-10-CM | POA: Diagnosis not present

## 2014-09-21 DIAGNOSIS — I10 Essential (primary) hypertension: Secondary | ICD-10-CM

## 2014-09-21 DIAGNOSIS — IMO0002 Reserved for concepts with insufficient information to code with codable children: Secondary | ICD-10-CM

## 2014-09-21 LAB — COMPREHENSIVE METABOLIC PANEL
ALK PHOS: 72 U/L (ref 39–117)
ALT: 91 U/L — AB (ref 0–53)
AST: 63 U/L — ABNORMAL HIGH (ref 0–37)
Albumin: 4.6 g/dL (ref 3.5–5.2)
BILIRUBIN TOTAL: 0.7 mg/dL (ref 0.2–1.2)
BUN: 12 mg/dL (ref 6–23)
CALCIUM: 9.5 mg/dL (ref 8.4–10.5)
CO2: 24 mEq/L (ref 19–32)
CREATININE: 0.78 mg/dL (ref 0.40–1.50)
Chloride: 103 mEq/L (ref 96–112)
GFR: 132.52 mL/min (ref >60.00)
Glucose, Bld: 162 mg/dL — ABNORMAL HIGH (ref 70–99)
POTASSIUM: 4.2 meq/L (ref 3.5–5.1)
Sodium: 136 mEq/L (ref 135–145)
Total Protein: 7.4 g/dL (ref 6.0–8.3)

## 2014-09-21 NOTE — Progress Notes (Signed)
Reason for visit-  Kyle [name] is a 54 y.o.-year-old male, for  Follow up of Type 2 diabetes, uncontrolled, without complications. Associated problem of CAD. Last visit May 2016. Didn't come for April visit. Non compliant with follow up, and possibly meds.   HPI- Patient has been diagnosed with diabetes in 2014. Recalls being initially on lifestyle modifications.   Started on metformin shortly after. he has been insulin since about 2014.  Has been uncontrolled this year atleast due to non compliance with drug regimen  *Patient reports struggling with pain in his neck and back, and reports that he has been turning to alcohol recently for pain. Now pain better and trying to wean down alcohol . Prior had been sober for preceding 7 years Is also a smoker.  * now reports taking 2 beers per night and shot of liquor every now and then   Pt is currently on a regimen of: - Metformin 1000 mg po bid ( taking it as directed) - Novolin 70/30 at 30 units am and 20 units supper ( reports taking it as directed) - Invokana 100 mg daily ( started 2 weeks ago, developed side effects of blurry vision, fatigue, dizziness,polyuria and not feeling well overall, is having to take time of work due to this- requests work note, stopped taking med yday and starting to feel better, has been staying hydrated)   Last hemoglobin A1c was: Lab Results  Component Value Date   HGBA1C 10.7* 07/12/2014   HGBA1C 13.0* 04/11/2014   HGBA1C 12.6* 01/05/2014     Pt checks his sugars  ? ( ran out of strips, he is getting supplies through [place] now). Uses one touch ultra2 glucometer. By recall, they are- :  PREMEAL Breakfast Lunch Dinner Bedtime Overall  Glucose range:     yday was 137 by recall  Mean/median:        POST-MEAL PC Breakfast PC Lunch PC Dinner  Glucose range:     Mean/median:      Last hospital stay 2015 for hyperglycemia.   Hypoglycemia-  No lows. Lowest sugar was n/a; he has hypoglycemia  awareness at 70.   Dietary habits- eats 1-2 times daily. Tries to limit carbs. Loves bread. White bread>>wheat. Cut back on alcohol intake. Cut out all sodas. 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 Weight - decreasing, reports that cut back on food intake  Wt Readings from Last 3 Encounters:  09/21/14 168 lb 1.9 oz (76.259 kg)  09/11/14 172 lb (78.019 kg)  09/08/14 169 lb 6.4 oz (76.839 kg)    Diabetes Complications-  Nephropathy- No  CKD, last BUN/creatinine-  Lab Results  Component Value Date   BUN 8 09/08/2014   CREATININE 0.64 09/08/2014   Lab Results  Component Value Date   GFR 166.53 09/08/2014   Lab Results  Component Value Date   MICRALBCREAT 0.8 04/11/2014    Retinopathy- No, Last DEE was in 2015 ( 8-9 months ago per pt)- no cataracts per his report Neuropathy- no numbness and tingling in his feet. No known neuropathy. Is on neurontin for back pain Associated history - hasCAD . No prior stroke. No hypothyroidism. his last TSH was  Lab Results  Component Value Date   TSH 1.34 02/24/2013    Hyperlipidemia-  his last set of lipids were- Currently on zocor 40 mg daily ( reports taking it daily now). Tolerating well.   Lab Results  Component Value Date  CHOL 135 11/24/2013   HDL 34.50* 11/24/2013   LDLDIRECT 74.1 11/24/2013   TRIG 238.0* 11/24/2013   CHOLHDL 4 11/24/2013   Lab Results  Component Value Date   AST 76* 06/09/2014   AST 28 01/11/2014   ALT 109* 06/09/2014   ALT 69* 01/11/2014    Blood Pressure/HTN- Patient's blood pressure is well controlled today.   I have reviewed the patient's past medical history, medications and allergies.    Current Outpatient Prescriptions on File Prior to Visit  Medication Sig Dispense Refill  . albuterol (PROVENTIL HFA;VENTOLIN HFA) 108 (90 BASE) MCG/ACT inhaler Inhale 2 puffs into the lungs as needed. 1 Inhaler 2  . aspirin 325 MG tablet Take 1 tablet (325 mg total) by  mouth daily. 30 tablet 5  . gabapentin (NEURONTIN) 800 MG tablet Take 800 mg by mouth 3 (three) times daily.     Marland Kitchen glucose blood test strip OneTouch ultra mini test strips. Use 4 times daily. Dx 250.0 100 each 5  . hydrOXYzine (ATARAX/VISTARIL) 25 MG tablet Take 1 tablet (25 mg total) by mouth every 8 (eight) hours as needed for itching. 30 tablet 1  . insulin NPH-regular Human (NOVOLIN 70/30) (70-30) 100 UNIT/ML injection 30 units in the AM and 20 units with evening meal    . Insulin Syringes, Disposable, U-100 1 ML MISC To inject 70/30 bid 100 each 6  . Lancets (ACCU-CHEK SOFT TOUCH) lancets Use as instructed 100 each 5  . LANCETS ULTRA FINE MISC To check blood sugar four times daily 100 each 6  . metFORMIN (GLUCOPHAGE) 1000 MG tablet Take 1 tablet (1,000 mg total) by mouth 2 (two) times daily with a meal. 60 tablet 5  . methocarbamol (ROBAXIN) 750 MG tablet Take 1 tablet (750 mg total) by mouth 3 (three) times daily as needed for muscle spasms. 45 tablet 1  . metoprolol tartrate (LOPRESSOR) 25 MG tablet Take 1.5 tablets (37.5 mg total) by mouth 2 (two) times daily. 135 tablet 3  . omeprazole (PRILOSEC) 20 MG capsule Take 1 capsule (20 mg total) by mouth daily. 30 capsule 3  . Oxycodone HCl 10 MG TABS Take 1 tablet (10 mg total) by mouth daily. Fill on or after May 12th, 2016 30 tablet 0  . sildenafil (VIAGRA) 100 MG tablet Take 100 mg by mouth daily as needed for erectile dysfunction.    . simvastatin (ZOCOR) 40 MG tablet Take 1 tablet (40 mg total) by mouth at bedtime. 90 tablet 3   No current facility-administered medications on file prior to visit.   Allergies  Allergen Reactions  . Amoxicillin   . Flexeril [Cyclobenzaprine]     rash  . Methadone   . Tramadol   . Trazodone And Nefazodone      Review of Systems- [ x ]  Complains of    [  ]  denies [  ] Recent weight change [ x ]  Fatigue [ x ] polydipsia [ x ] polyuria [ x ]  nocturia [ x ]  vision difficulty [  ] chest pain [ ]   shortness of breath [  ] leg swelling [  ] cough [  ] nausea/vomiting [  ] diarrhea [  ] constipation [  ] abdominal pain [  x]  tingling/numbness in extremities [  ]  concern with feet ( wounds/sores)   PE: BP 110/74 mmHg  Pulse 91  Resp 12  Ht 5\' 7"  (1.702 m)  Wt 168 lb 1.9 oz (76.259  kg)  BMI 26.33 kg/m2 Wt Readings from Last 3 Encounters:  09/21/14 168 lb 1.9 oz (76.259 kg)  09/11/14 172 lb (78.019 kg)  09/08/14 169 lb 6.4 oz (76.839 kg)   HEENT: [place]/AT, EOMI, no icterus, no proptosis, no chemosis, no mild lid lag, no retraction, eyes close completely Lungs: good air entry, clear bilaterally Heart: S1&S2 normal, regular rate & rhythm; no murmurs, rubs or gallops Ext: no tremor in hands bilaterally, no edema, 2+ DP/PT pulses, good muscle mass Neuro: normal gait, 2+ reflexes bilaterally, normal 5/5 strength, no proximal myopathy  Derm: no pretibial myxoedema/skin dryness   ASSESSMENT AND PLAN: Problem List Items Addressed This Visit      Cardiovascular and Mediastinum   HTN (hypertension)    BP at target today. Recent urine MA negative.             Relevant Orders   Comprehensive metabolic panel     Other   Diabetes mellitus type 2, uncontrolled - Primary    He has again forgotten to bring his meter to this appointment and hasn't been checking his sugars. He was reminded to bring meter to all future appointments and check sugars 3 x daily . Had another long discussion with him on this and importance of checking sugars. Without any data, I am unable to make any adjustments. Suspect that the recent onset of symptoms after start of Invokana might have been related to his improved sugars/low sugars while on therapy. Have asked him to stop for now as he reports that he is not able to function at work while on this medication. Will reassess at future visits whether we can retry this medication. Given his symptoms will check CMP for AKI/lytes. Stop alcohol use all together.      Need more data to adjust his sugars. Suspect that he is going to need more insulin, but hesistent on making a change based on his recall readings.    Continue current metformin for now. Continue current 70/30 insulin at 30 units am and 20 units in the evening. He has promised me to send in his sugars in 1 week for review. Sugar log and sample test strips given.                    Relevant Orders   Comprehensive metabolic panel   Non compliance w medication regimen    Discussed that he needs to be taking his medications as directed - so that we could work with him to improve his sugars. He reports taking his metformin and insulin as prescribed.   Discussed that repeated non compliance might be a reason for dismissal from the practice        Relevant Orders   Comprehensive metabolic panel      - Return to clinic in 4 weeks with sugar log/meter     [facility] 09/21/2014 11:55 AM

## 2014-09-21 NOTE — Assessment & Plan Note (Signed)
BP at target today. Recent urine MA negative.

## 2014-09-21 NOTE — Assessment & Plan Note (Signed)
He has again forgotten to bring his meter to this appointment and hasn't been checking his sugars. He was reminded to bring meter to all future appointments and check sugars 3 x daily . Had another long discussion with him on this and importance of checking sugars. Without any data, I am unable to make any adjustments. Suspect that the recent onset of symptoms after start of Invokana might have been related to his improved sugars/low sugars while on therapy. Have asked him to stop for now as he reports that he is not able to function at work while on this medication. Will reassess at future visits whether we can retry this medication. Given his symptoms will check CMP for AKI/lytes. Stop alcohol use all together.     Need more data to adjust his sugars. Suspect that he is going to need more insulin, but hesistent on making a change based on his recall readings.    Continue current metformin for now. Continue current 70/30 insulin at 30 units am and 20 units in the evening. He has promised me to send in his sugars in 1 week for review. Sugar log and sample test strips given.

## 2014-09-21 NOTE — Progress Notes (Signed)
Pre visit review using our clinic review tool, if applicable. No additional management support is needed unless otherwise documented below in the visit note. 

## 2014-09-21 NOTE — Patient Instructions (Addendum)
Stop Invokana.  Continue current Humulin 70/30 and metformin. He has promised to drop off his sugars in 1 week for further review.   Please come back for a follow-up appointment in 1 month. Labs today given his symptoms  Testing supplies and sugar log given Check sugars 3 times daily ( fasting and premeal readings at alternating times, or at bedtime).  Record them in a sugar log and bring log and meter to next appointment.

## 2014-09-21 NOTE — Assessment & Plan Note (Signed)
Discussed that he needs to be taking his medications as directed - so that we could work with him to improve his sugars. He reports taking his metformin and insulin as prescribed.   Discussed that repeated non compliance might be a reason for dismissal from the practice

## 2014-09-22 NOTE — Assessment & Plan Note (Signed)
Will check HFPs today, pt drinking alcohol still.

## 2014-09-22 NOTE — Assessment & Plan Note (Signed)
Pt was warned of non-compliance and that he will no longer received medication if there is not an x-ray.

## 2014-09-27 ENCOUNTER — Observation Stay
Admission: EM | Admit: 2014-09-27 | Discharge: 2014-09-29 | Disposition: A | Discharge status: Home / Self Care | Payer: Medicare Other | Attending: Internal Medicine | Admitting: Internal Medicine

## 2014-09-27 ENCOUNTER — Encounter: Payer: Self-pay | Admitting: Emergency Medicine

## 2014-09-27 ENCOUNTER — Emergency Department: Payer: Medicare Other

## 2014-09-27 DIAGNOSIS — M549 Dorsalgia, unspecified: Secondary | ICD-10-CM | POA: Diagnosis not present

## 2014-09-27 DIAGNOSIS — R079 Chest pain, unspecified: Secondary | ICD-10-CM | POA: Diagnosis present

## 2014-09-27 DIAGNOSIS — R0789 Other chest pain: Secondary | ICD-10-CM | POA: Diagnosis not present

## 2014-09-27 DIAGNOSIS — Z79899 Other long term (current) drug therapy: Secondary | ICD-10-CM | POA: Diagnosis not present

## 2014-09-27 DIAGNOSIS — Z888 Allergy status to other drugs, medicaments and biological substances status: Secondary | ICD-10-CM | POA: Diagnosis not present

## 2014-09-27 DIAGNOSIS — M542 Cervicalgia: Secondary | ICD-10-CM | POA: Insufficient documentation

## 2014-09-27 DIAGNOSIS — B182 Chronic viral hepatitis C: Secondary | ICD-10-CM | POA: Diagnosis not present

## 2014-09-27 DIAGNOSIS — G8929 Other chronic pain: Secondary | ICD-10-CM | POA: Insufficient documentation

## 2014-09-27 DIAGNOSIS — E559 Vitamin D deficiency, unspecified: Secondary | ICD-10-CM | POA: Insufficient documentation

## 2014-09-27 DIAGNOSIS — R0602 Shortness of breath: Secondary | ICD-10-CM | POA: Insufficient documentation

## 2014-09-27 DIAGNOSIS — R740 Nonspecific elevation of levels of transaminase and lactic acid dehydrogenase [LDH]: Secondary | ICD-10-CM | POA: Insufficient documentation

## 2014-09-27 DIAGNOSIS — I25110 Atherosclerotic heart disease of native coronary artery with unstable angina pectoris: Secondary | ICD-10-CM | POA: Insufficient documentation

## 2014-09-27 DIAGNOSIS — J449 Chronic obstructive pulmonary disease, unspecified: Secondary | ICD-10-CM | POA: Diagnosis not present

## 2014-09-27 DIAGNOSIS — K219 Gastro-esophageal reflux disease without esophagitis: Secondary | ICD-10-CM | POA: Insufficient documentation

## 2014-09-27 DIAGNOSIS — Z7982 Long term (current) use of aspirin: Secondary | ICD-10-CM | POA: Diagnosis not present

## 2014-09-27 DIAGNOSIS — L84 Corns and callosities: Secondary | ICD-10-CM | POA: Insufficient documentation

## 2014-09-27 DIAGNOSIS — Z87891 Personal history of nicotine dependence: Secondary | ICD-10-CM

## 2014-09-27 DIAGNOSIS — M47814 Spondylosis without myelopathy or radiculopathy, thoracic region: Secondary | ICD-10-CM | POA: Insufficient documentation

## 2014-09-27 DIAGNOSIS — Z9114 Patient's other noncompliance with medication regimen: Secondary | ICD-10-CM | POA: Diagnosis not present

## 2014-09-27 DIAGNOSIS — E785 Hyperlipidemia, unspecified: Secondary | ICD-10-CM | POA: Diagnosis present

## 2014-09-27 DIAGNOSIS — Z951 Presence of aortocoronary bypass graft: Secondary | ICD-10-CM | POA: Diagnosis not present

## 2014-09-27 DIAGNOSIS — I4510 Unspecified right bundle-branch block: Secondary | ICD-10-CM | POA: Insufficient documentation

## 2014-09-27 DIAGNOSIS — Z794 Long term (current) use of insulin: Secondary | ICD-10-CM | POA: Insufficient documentation

## 2014-09-27 DIAGNOSIS — I509 Heart failure, unspecified: Secondary | ICD-10-CM | POA: Diagnosis not present

## 2014-09-27 DIAGNOSIS — Z23 Encounter for immunization: Secondary | ICD-10-CM | POA: Diagnosis not present

## 2014-09-27 DIAGNOSIS — I2510 Atherosclerotic heart disease of native coronary artery without angina pectoris: Secondary | ICD-10-CM

## 2014-09-27 DIAGNOSIS — E1165 Type 2 diabetes mellitus with hyperglycemia: Secondary | ICD-10-CM | POA: Diagnosis not present

## 2014-09-27 DIAGNOSIS — I25118 Atherosclerotic heart disease of native coronary artery with other forms of angina pectoris: Secondary | ICD-10-CM | POA: Diagnosis present

## 2014-09-27 DIAGNOSIS — I10 Essential (primary) hypertension: Secondary | ICD-10-CM | POA: Diagnosis present

## 2014-09-27 DIAGNOSIS — F1010 Alcohol abuse, uncomplicated: Secondary | ICD-10-CM | POA: Diagnosis not present

## 2014-09-27 HISTORY — DX: Type 2 diabetes mellitus without complications: E11.9

## 2014-09-27 HISTORY — DX: Other psychoactive substance abuse, uncomplicated: F19.10

## 2014-09-27 HISTORY — DX: Atherosclerotic heart disease of native coronary artery without angina pectoris: I25.10

## 2014-09-27 HISTORY — DX: Hyperlipidemia, unspecified: E78.5

## 2014-09-27 HISTORY — DX: Presence of aortocoronary bypass graft: Z95.1

## 2014-09-27 LAB — BASIC METABOLIC PANEL
Anion gap: 12 (ref 5–15)
BUN: 11 mg/dL (ref 6–20)
CHLORIDE: 104 mmol/L (ref 101–111)
CO2: 22 mmol/L (ref 22–32)
Calcium: 9.4 mg/dL (ref 8.9–10.3)
Creatinine, Ser: 0.64 mg/dL (ref 0.61–1.24)
GFR calc Af Amer: >60 mL/min (ref >60)
GLUCOSE: 134 mg/dL — AB (ref 65–99)
POTASSIUM: 4.3 mmol/L (ref 3.5–5.1)
Sodium: 138 mmol/L (ref 135–145)

## 2014-09-27 LAB — TROPONIN I
Troponin I: <0.03 ng/mL (ref <0.031)
Troponin I: <0.03 ng/mL (ref <0.031)
Troponin I: <0.03 ng/mL (ref <0.031)

## 2014-09-27 LAB — CBC
HCT: 48.4 % (ref 40.0–52.0)
Hemoglobin: 15.6 g/dL (ref 13.0–18.0)
MCH: 28.2 pg (ref 26.0–34.0)
MCHC: 32.2 g/dL (ref 32.0–36.0)
MCV: 87.8 fL (ref 80.0–100.0)
Platelets: 124 10*3/uL — ABNORMAL LOW (ref 150–440)
RBC: 5.51 MIL/uL (ref 4.40–5.90)
RDW: 13.6 % (ref 11.5–14.5)
WBC: 13.1 10*3/uL — ABNORMAL HIGH (ref 3.8–10.6)

## 2014-09-27 LAB — GLUCOSE, CAPILLARY
GLUCOSE-CAPILLARY: 59 mg/dL — AB (ref 65–99)
Glucose-Capillary: 156 mg/dL — ABNORMAL HIGH (ref 65–99)
Glucose-Capillary: 82 mg/dL (ref 65–99)

## 2014-09-27 MED ORDER — INSULIN ASPART PROT & ASPART (70-30 MIX) 100 UNIT/ML ~~LOC~~ SUSP
30.0000 [IU] | Freq: Every day | SUBCUTANEOUS | Status: DC
  Administered 2014-09-28 – 2014-09-29 (×2): 30 [IU] via SUBCUTANEOUS
  Filled 2014-09-27: qty 300
  Filled 2014-09-27: qty 10

## 2014-09-27 MED ORDER — ASPIRIN 81 MG PO CHEW
243.0000 mg | CHEWABLE_TABLET | Freq: Once | ORAL | Status: AC
  Administered 2014-09-27: 243 mg via ORAL

## 2014-09-27 MED ORDER — ASPIRIN 81 MG PO CHEW
CHEWABLE_TABLET | ORAL | Status: AC
  Administered 2014-09-27: 243 mg via ORAL
  Filled 2014-09-27: qty 3

## 2014-09-27 MED ORDER — ONDANSETRON HCL 4 MG PO TABS: 4.0000 mg | ORAL_TABLET | Freq: Four times a day (QID) | ORAL | Status: DC | PRN

## 2014-09-27 MED ORDER — ONDANSETRON HCL 4 MG/2ML IJ SOLN: 4.0000 mg | Freq: Four times a day (QID) | INTRAMUSCULAR | Status: DC | PRN

## 2014-09-27 MED ORDER — ALBUTEROL SULFATE (2.5 MG/3ML) 0.083% IN NEBU: 3.0000 mL | INHALATION_SOLUTION | RESPIRATORY_TRACT | Status: DC | PRN

## 2014-09-27 MED ORDER — INSULIN ASPART 100 UNIT/ML ~~LOC~~ SOLN
0.0000 [IU] | Freq: Three times a day (TID) | SUBCUTANEOUS | Status: DC
  Administered 2014-09-27: 3 [IU] via SUBCUTANEOUS
  Administered 2014-09-29: 2 [IU] via SUBCUTANEOUS
  Filled 2014-09-27: qty 3
  Filled 2014-09-27: qty 2

## 2014-09-27 MED ORDER — NITROGLYCERIN 0.4 MG SL SUBL
0.4000 mg | SUBLINGUAL_TABLET | SUBLINGUAL | Status: DC | PRN
  Administered 2014-09-27: 0.4 mg via SUBLINGUAL

## 2014-09-27 MED ORDER — OXYCODONE HCL 5 MG PO TABS
10.0000 mg | ORAL_TABLET | Freq: Every day | ORAL | Status: DC
  Administered 2014-09-27 – 2014-09-28 (×2): 10 mg via ORAL
  Filled 2014-09-27 (×3): qty 2

## 2014-09-27 MED ORDER — INSULIN ASPART PROT & ASPART (70-30 MIX) 100 UNIT/ML ~~LOC~~ SUSP
20.0000 [IU] | Freq: Every day | SUBCUTANEOUS | Status: DC
  Administered 2014-09-27: 20 [IU] via SUBCUTANEOUS
  Filled 2014-09-27: qty 200

## 2014-09-27 MED ORDER — SODIUM CHLORIDE 0.9 % IJ SOLN
3.0000 mL | Freq: Two times a day (BID) | INTRAMUSCULAR | Status: DC
Start: 2014-09-27 — End: 2014-09-29
  Administered 2014-09-27 – 2014-09-28 (×4): 3 mL via INTRAVENOUS

## 2014-09-27 MED ORDER — GABAPENTIN 800 MG PO TABS
800.0000 mg | ORAL_TABLET | Freq: Three times a day (TID) | ORAL | Status: DC
  Administered 2014-09-27: 800 mg via ORAL
  Filled 2014-09-27 (×5): qty 1

## 2014-09-27 MED ORDER — ASPIRIN EC 81 MG PO TBEC: 81.0000 mg | DELAYED_RELEASE_TABLET | Freq: Every day | ORAL | Status: DC

## 2014-09-27 MED ORDER — METOPROLOL TARTRATE 25 MG PO TABS
37.5000 mg | ORAL_TABLET | Freq: Two times a day (BID) | ORAL | Status: DC
  Administered 2014-09-27 – 2014-09-28 (×2): 37.5 mg via ORAL
  Administered 2014-09-28: 50 mg via ORAL
  Administered 2014-09-29: 37.5 mg via ORAL
  Filled 2014-09-27 (×2): qty 2
  Filled 2014-09-27: qty 1
  Filled 2014-09-27: qty 2

## 2014-09-27 MED ORDER — NITROGLYCERIN 0.4 MG SL SUBL
SUBLINGUAL_TABLET | SUBLINGUAL | Status: AC
  Administered 2014-09-27: 0.4 mg via SUBLINGUAL
  Filled 2014-09-27: qty 1

## 2014-09-27 MED ORDER — HYDROCODONE-ACETAMINOPHEN 5-325 MG PO TABS: 1.0000 | ORAL_TABLET | ORAL | Status: DC | PRN

## 2014-09-27 MED ORDER — SIMVASTATIN 40 MG PO TABS
40.0000 mg | ORAL_TABLET | Freq: Every day | ORAL | Status: DC
  Administered 2014-09-27: 40 mg via ORAL
  Filled 2014-09-27: qty 1

## 2014-09-27 MED ORDER — METFORMIN HCL 500 MG PO TABS
1000.0000 mg | ORAL_TABLET | Freq: Two times a day (BID) | ORAL | Status: DC
  Administered 2014-09-27 – 2014-09-29 (×4): 1000 mg via ORAL
  Filled 2014-09-27 (×4): qty 2

## 2014-09-27 MED ORDER — METHOCARBAMOL 750 MG PO TABS
750.0000 mg | ORAL_TABLET | Freq: Three times a day (TID) | ORAL | Status: DC | PRN
  Filled 2014-09-27: qty 1

## 2014-09-27 MED ORDER — INSULIN ASPART 100 UNIT/ML ~~LOC~~ SOLN: 0.0000 [IU] | Freq: Every day | SUBCUTANEOUS | Status: DC

## 2014-09-27 MED ORDER — SENNOSIDES-DOCUSATE SODIUM 8.6-50 MG PO TABS: 1.0000 | ORAL_TABLET | Freq: Every evening | ORAL | Status: DC | PRN

## 2014-09-27 MED ORDER — ASPIRIN EC 325 MG PO TBEC
325.0000 mg | DELAYED_RELEASE_TABLET | Freq: Every day | ORAL | Status: DC
  Administered 2014-09-27 – 2014-09-29 (×3): 325 mg via ORAL
  Filled 2014-09-27 (×3): qty 1

## 2014-09-27 MED ORDER — HYDROXYZINE HCL 25 MG PO TABS: 25.0000 mg | ORAL_TABLET | Freq: Three times a day (TID) | ORAL | Status: DC | PRN

## 2014-09-27 MED ORDER — PANTOPRAZOLE SODIUM 40 MG PO TBEC
40.0000 mg | DELAYED_RELEASE_TABLET | Freq: Every day | ORAL | Status: DC
  Administered 2014-09-27 – 2014-09-29 (×3): 40 mg via ORAL
  Filled 2014-09-27 (×3): qty 1

## 2014-09-27 MED ORDER — ENOXAPARIN SODIUM 40 MG/0.4ML ~~LOC~~ SOLN
40.0000 mg | SUBCUTANEOUS | Status: DC
  Administered 2014-09-27 – 2014-09-28 (×2): 40 mg via SUBCUTANEOUS
  Filled 2014-09-27 (×2): qty 0.4

## 2014-09-27 MED ORDER — ACETAMINOPHEN 650 MG RE SUPP: 650.0000 mg | Freq: Four times a day (QID) | RECTAL | Status: DC | PRN

## 2014-09-27 MED ORDER — GABAPENTIN 300 MG PO CAPS
800.0000 mg | ORAL_CAPSULE | Freq: Three times a day (TID) | ORAL | Status: DC
  Administered 2014-09-27 – 2014-09-29 (×5): 800 mg via ORAL
  Filled 2014-09-27 (×4): qty 2

## 2014-09-27 MED ORDER — ACETAMINOPHEN 325 MG PO TABS: 650.0000 mg | ORAL_TABLET | Freq: Four times a day (QID) | ORAL | Status: DC | PRN

## 2014-09-27 NOTE — ED Provider Notes (Signed)
[facility] Emergency Department Provider Note  ____________________________________________  Time seen: 2:15 PM  I have reviewed the triage vital signs and the nursing notes.   HISTORY  Chief Complaint Chest Pain    HPI Kyle [name] is a 54 y.o. male who presents with chest pain that started yesterday which is described as pressure-like. Patient reports a history of a bypass 2 years ago but has not seen a cardiologist since. He has a history of diabetes as well and he does smoke. He denies radiation of the pain but does note mild shortness of breath with lying down. The pain is been intermittent over the last 24 hours and currently is mild.     Past Medical History  Diagnosis Date  . Asthma   . Diabetes mellitus without complication   . COPD (chronic obstructive pulmonary disease)   . Fainting spell   . GERD (gastroesophageal reflux disease)   . Allergy   . Hyperlipidemia   . Hypertension   . Urine incontinence   . CAD (coronary artery disease)   . 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 Chronic back pain 1981  . DDD (degenerative disc disease)     Patient Active Problem List   Diagnosis Date Noted  . Cervicalgia 07/05/2014  . Acute bacterial rhinosinusitis 06/15/2014  . Non compliance w medication regimen 06/07/2014  . Chest tightness 06/07/2014  . Tobacco abuse counseling 01/05/2014  . Vitamin D deficiency 01/05/2014  . Transaminitis 01/05/2014  . Temporary low platelet count 01/05/2014  . Diabetes mellitus type 2, uncontrolled 01/05/2014  . Back pain 10/01/2013  . Need for prophylactic vaccination with tetanus-diphtheria (TD) 10/01/2013  . Penile lesion 10/01/2013  . HTN (hypertension) 08/29/2013  . HLD (hyperlipidemia) 08/29/2013  . Callus of foot 08/29/2013  . Blurred vision 08/29/2013  . Itching 08/29/2013  . Exacerbation of chronic back pain 06/07/2013  . Hyperlipidemia 03/02/2013  . Former smoker 03/02/2013  . CAD (coronary  artery disease) of artery bypass graft 02/27/2013  . Routine general medical examination at a health care facility 02/24/2013  . Hepatitis C 02/24/2013  . Diabetes type 2, uncontrolled 02/24/2013    Past Surgical History  Procedure Laterality Date  . 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 Cardiac catheterization  11/2012    [facility]    Current Outpatient Rx  Name  Route  Sig  Dispense  Refill  . albuterol (PROVENTIL HFA;VENTOLIN HFA) 108 (90 BASE) MCG/ACT inhaler   Inhalation   Inhale 2 puffs into the lungs as needed.   1 Inhaler   2   . aspirin 325 MG tablet   Oral   Take 1 tablet (325 mg total) by mouth daily.   30 tablet   5   . gabapentin (NEURONTIN) 800 MG tablet   Oral   Take 800 mg by mouth 3 (three) times daily.          Marland Kitchen glucose blood test strip      OneTouch ultra mini test strips. Use 4 times daily. Dx 250.0   100 each   5   . hydrOXYzine (ATARAX/VISTARIL) 25 MG tablet   Oral   Take 1 tablet (25 mg total) by mouth every 8 (eight) hours as needed for itching.   30 tablet   1   . insulin NPH-regular Human (NOVOLIN 70/30) (70-30) 100 UNIT/ML injection      30 units in the AM  and 20 units with evening meal         . Insulin Syringes, Disposable, U-100 1 ML MISC      To inject 70/30 bid   100 each   6   . Lancets (ACCU-CHEK SOFT TOUCH) lancets      Use as instructed   100 each   5   . LANCETS ULTRA FINE MISC      To check blood sugar four times daily   100 each   6   . metFORMIN (GLUCOPHAGE) 1000 MG tablet   Oral   Take 1 tablet (1,000 mg total) by mouth 2 (two) times daily with a meal.   60 tablet   5   . methocarbamol (ROBAXIN) 750 MG tablet   Oral   Take 1 tablet (750 mg total) by mouth 3 (three) times daily as needed for muscle spasms.   45 tablet   1   . metoprolol tartrate (LOPRESSOR) 25 MG tablet   Oral   Take 1.5 tablets (37.5 mg total) by mouth 2 (two) times daily.   135 tablet   3   .  omeprazole (PRILOSEC) 20 MG capsule   Oral   Take 1 capsule (20 mg total) by mouth daily.   30 capsule   3   . Oxycodone HCl 10 MG TABS   Oral   Take 1 tablet (10 mg total) by mouth daily. Fill on or after May 12th, 2016   30 tablet   0   . sildenafil (VIAGRA) 100 MG tablet   Oral   Take 100 mg by mouth daily as needed for erectile dysfunction.         . simvastatin (ZOCOR) 40 MG tablet   Oral   Take 1 tablet (40 mg total) by mouth at bedtime.   90 tablet   3     Allergies Amoxicillin; Flexeril; Methadone; Tramadol; and Trazodone and nefazodone  Family History  Problem Relation Age of Onset  . Heart disease Mother   . Heart disease Father   . Hypertension Father   . Diabetes Sister   . Cancer Sister     skin cancer  . Diabetes Other   . Heart disease Other     Social History History  Substance Use Topics  . Smoking status: Current Every Day Smoker -- 35 years    Types: Cigarettes    Last Attempt to Quit: 11/24/2012  . Smokeless tobacco: Never Used  . Alcohol Use: Yes     Comment: occasional    Review of Systems  Constitutional: Negative for fever. Eyes: Negative for visual changes. ENT: Negative for sore throat Cardiovascular: Positive for chest pain Respiratory: Negative for shortness of breath. Gastrointestinal: Negative for abdominal pain, vomiting and diarrhea. Genitourinary: Negative for dysuria. Musculoskeletal: Negative for back pain. Skin: Negative for rash. Neurological: Negative for headaches or focal weakness   10-point ROS otherwise negative.  ____________________________________________   PHYSICAL EXAM:  VITAL SIGNS: ED Triage Vitals  Enc Vitals Group     BP 09/27/14 1018 138/95 mmHg     Pulse Rate 09/27/14 1018 93     Resp 09/27/14 1018 18     Temp 09/27/14 1018 98.3 F (36.8 C)     Temp src --      SpO2 09/27/14 1018 99 %     Weight 09/27/14 1018 170 lb (77.111 kg)     Height 09/27/14 1018 5\' 7"  (1.702 m)     Head Cir  --  Peak Flow --      Pain Score 09/27/14 1030 6     Pain Loc --      Pain Edu? --      Excl. in [place]? --      Constitutional: Alert and oriented. Well appearing and in no distress. Eyes: Conjunctivae are normal. PERRL. ENT   Head: Normocephalic and atraumatic.   Nose: No rhinnorhea.   Mouth/Throat: Mucous membranes are moist. Cardiovascular: Normal rate, regular rhythm. Normal and symmetric distal pulses are present in all extremities. No murmurs, rubs, or gallops. Respiratory: Normal respiratory effort without tachypnea nor retractions. Breath sounds are clear and equal bilaterally.  Gastrointestinal: Soft and non-tender in all quadrants. No distention. There is no CVA tenderness. Genitourinary: deferred Musculoskeletal: Nontender with normal range of motion in all extremities. No lower extremity tenderness nor edema. Neurologic:  Normal speech and language. No gross focal neurologic deficits are appreciated. Skin:  Skin is warm, dry and intact. No rash noted. Psychiatric: Mood and affect are normal. Patient exhibits appropriate insight and judgment.  ____________________________________________    LABS (pertinent positives/negatives)  Labs Reviewed  CBC - Abnormal; Notable for the following:    WBC 13.1 (*)    Platelets 124 (*)    All other components within normal limits  BASIC METABOLIC PANEL - Abnormal; Notable for the following:    Glucose, Bld 134 (*)    All other components within normal limits  TROPONIN I    ____________________________________________   EKG  ED ECG REPORT I, Lavonia Drafts, the attending physician, personally viewed and interpreted this ECG.   Date: 09/27/2014  EKG Time: 1023  Rate: 100  Rhythm: normal sinus rhythm  Axis: Normal axis  Intervals:right bundle branch block  ST&T Change: Nonspecific changes   ____________________________________________    RADIOLOGY  No acute distress on chest  x-ray  ____________________________________________   PROCEDURES  Procedure(s) performed: none  Critical Care performed: none  ____________________________________________   INITIAL IMPRESSION / ASSESSMENT AND PLAN / ED COURSE  Pertinent labs & imaging results that were available during my care of the patient were reviewed by me and considered in my medical decision making (see chart for details).  Patient with significant cardiac history and has not seen a cardiologist in 2 years. He is having intermittent chest pain over the last 24 hours. EKG abnormal. We'll give nitroglycerin and aspirin and consider admission to the hospital  ____________________________________________   FINAL CLINICAL IMPRESSION(S) / ED DIAGNOSES  Final diagnoses:  Other chest pain     Lavonia Drafts, MD 09/27/14 [phone]

## 2014-09-27 NOTE — H&P (Signed)
[place] at [place] NAME: Kyle [name]    MR#:  [id number]  DATE OF BIRTH:  1960-12-06  DATE OF ADMISSION:  09/27/2014  PRIMARY CARE PHYSICIAN: Rubbie Battiest, NP   REQUESTING/REFERRING PHYSICIAN: Dr. Corky Downs  CHIEF COMPLAINT:   Chief Complaint  Patient presents with  . Chest Pain    HISTORY OF PRESENT ILLNESS:  Kyle [name]  is a 54 y.o. male with a known history of CAD/CABG, HTN, DM, Tobacco abuse here with chest pain. Started yesterday night at rest. No aggravating or relieving factors other than nitroglycerin here in the emergency room but seems to have improved his pain a little. Patient continues to smoke. Had CABG 2 years prior in [place]. Has seen [facility] cardiology once in the past for a routine follow-up and year prior. Presently his pain is resolved. No shortness of breath, nausea, lightheadedness, syncope, abdominal pain. Patient takes aspirin and statin at home. No stress test or cardiac catheterization since his CABG.  PAST MEDICAL HISTORY:   Past Medical History  Diagnosis Date  . Asthma   . Diabetes mellitus without complication   . COPD (chronic obstructive pulmonary disease)   . Fainting spell   . GERD (gastroesophageal reflux disease)   . Allergy   . Hyperlipidemia   . Hypertension   . Urine incontinence   . CAD (coronary artery disease)   . 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 Chronic back pain 1981  . DDD (degenerative disc disease)     PAST SURGICAL HISTORY:   Past Surgical History  Procedure Laterality Date  . 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 Cardiac catheterization  11/2012    [facility]    SOCIAL HISTORY:   History  Substance Use Topics  . Smoking status: Current Every Day Smoker -- 35 years    Types: Cigarettes    Last Attempt to Quit: 11/24/2012  . Smokeless tobacco: Never Used  . Alcohol Use: Yes     Comment: occasional     FAMILY HISTORY:   Family History  Problem Relation Age of Onset  . Heart disease Mother   . Heart disease Father   . Hypertension Father   . Diabetes Sister   . Cancer Sister     skin cancer  . Diabetes Other   . Heart disease Other     DRUG ALLERGIES:   Allergies  Allergen Reactions  . Amoxicillin Itching  . Flexeril [Cyclobenzaprine]     rash  . Methadone     Reaction: Gi upset  . Tramadol   . Trazodone And Nefazodone     Reaction: GI upset    REVIEW OF SYSTEMS:   Review of Systems  Constitutional: Negative for fever, chills, weight loss and malaise/fatigue.  HENT: Negative for hearing loss and nosebleeds.   Eyes: Negative for blurred vision, double vision and pain.  Respiratory: Negative for cough, hemoptysis, sputum production, shortness of breath and wheezing.   Cardiovascular: Positive for chest pain. Negative for palpitations, orthopnea and leg swelling.  Gastrointestinal: Negative for nausea, vomiting, abdominal pain, diarrhea and constipation.  Genitourinary: Negative for dysuria and hematuria.  Musculoskeletal: Negative for myalgias, back pain and falls.  Skin: Negative for rash.  Neurological: Negative for dizziness, tremors, sensory change, speech change, focal weakness, seizures and headaches.  Endo/Heme/Allergies: Does not bruise/bleed easily.  Psychiatric/Behavioral: Negative for depression and  memory loss. The patient is not nervous/anxious.     MEDICATIONS AT HOME:   Prior to Admission medications   Medication Sig Start Date End Date Taking? Authorizing Provider  albuterol (PROVENTIL HFA;VENTOLIN HFA) 108 (90 BASE) MCG/ACT inhaler Inhale 2 puffs into the lungs as needed. 09/08/14  Yes Rubbie Battiest, NP  aspirin 325 MG tablet Take 1 tablet (325 mg total) by mouth daily. 06/04/13  Yes Raquel Dagoberto Ligas, NP  gabapentin (NEURONTIN) 800 MG tablet Take 800 mg by mouth 3 (three) times daily.    Yes Historical Provider, MD  glucose blood test strip OneTouch  ultra mini test strips. Use 4 times daily. Dx 250.0 06/07/14  Yes Radhika Concha Se, MD  hydrOXYzine (ATARAX/VISTARIL) 25 MG tablet Take 1 tablet (25 mg total) by mouth every 8 (eight) hours as needed for itching. 07/13/14  Yes Rubbie Battiest, NP  insulin NPH-regular Human (NOVOLIN 70/30) (70-30) 100 UNIT/ML injection 30 units in the AM and 20 units with evening meal 05/04/13  Yes Raquel Dagoberto Ligas, NP  Insulin Syringes, Disposable, U-100 1 ML MISC To inject 70/30 bid 05/04/13  Yes Raquel Dagoberto Ligas, NP  Lancets (ACCU-CHEK SOFT TOUCH) lancets Use as instructed 05/17/14  Yes Radhika Concha Se, MD  LANCETS ULTRA FINE MISC To check blood sugar four times daily 05/04/13  Yes Raquel Dagoberto Ligas, NP  metFORMIN (GLUCOPHAGE) 1000 MG tablet Take 1 tablet (1,000 mg total) by mouth 2 (two) times daily with a meal. 09/13/13  Yes Raquel Dagoberto Ligas, NP  methocarbamol (ROBAXIN) 750 MG tablet Take 1 tablet (750 mg total) by mouth 3 (three) times daily as needed for muscle spasms. 09/08/14  Yes Rubbie Battiest, NP  metoprolol tartrate (LOPRESSOR) 25 MG tablet Take 1.5 tablets (37.5 mg total) by mouth 2 (two) times daily. 09/03/13  Yes Minna Merritts, MD  omeprazole (PRILOSEC) 20 MG capsule Take 1 capsule (20 mg total) by mouth daily. 04/16/13  Yes Raquel Dagoberto Ligas, NP  Oxycodone HCl 10 MG TABS Take 1 tablet (10 mg total) by mouth daily. Fill on or after May 12th, 2016 09/08/14  Yes Rubbie Battiest, NP  sildenafil (VIAGRA) 100 MG tablet Take 100 mg by mouth daily as needed for erectile dysfunction.   Yes Historical Provider, MD  simvastatin (ZOCOR) 40 MG tablet Take 1 tablet (40 mg total) by mouth at bedtime. 09/08/14  Yes Rubbie Battiest, NP      VITAL SIGNS:  Blood pressure 138/95, pulse 93, temperature 98.3 F (36.8 C), resp. rate 18, height 5\' 7"  (1.702 m), weight 77.111 kg (170 lb), SpO2 99 %.  PHYSICAL EXAMINATION:  Physical Exam  GENERAL:  54 y.o.-year-old patient lying in the bed with no acute distress.  EYES: Pupils equal, round, reactive to light  and accommodation. No scleral icterus. Extraocular muscles intact.  HEENT: Head atraumatic, normocephalic. Oropharynx and nasopharynx clear. No oropharyngeal erythema, moist oral mucosa  NECK:  Supple, no jugular venous distention. No thyroid enlargement, no tenderness.  LUNGS: Normal breath sounds bilaterally, no wheezing, rales, rhonchi. No use of accessory muscles of respiration.  CARDIOVASCULAR: S1, S2 normal. No murmurs, rubs, or gallops.  ABDOMEN: Soft, nontender, nondistended. Bowel sounds present. No organomegaly or mass.  EXTREMITIES: No pedal edema, cyanosis, or clubbing. + 2 pedal & radial pulses b/l.   NEUROLOGIC: Cranial nerves II through XII are intact. No focal Motor or sensory deficits appreciated b/l PSYCHIATRIC: The patient is alert and oriented x 3. Good affect.  SKIN: No  obvious rash, lesion, or ulcer.   LABORATORY PANEL:   CBC  Recent Labs Lab 09/27/14 1032  WBC 13.1*  HGB 15.6  HCT 48.4  PLT 124*   ------------------------------------------------------------------------------------------------------------------  Chemistries   Recent Labs Lab 09/21/14 1039 09/27/14 1032  NA 136 138  K 4.2 4.3  CL 103 104  CO2 24 22  GLUCOSE 162* 134*  BUN 12 11  CREATININE 0.78 0.64  CALCIUM 9.5 9.4  AST 63*  --   ALT 91*  --   ALKPHOS 72  --   BILITOT 0.7  --    ------------------------------------------------------------------------------------------------------------------  Cardiac Enzymes  Recent Labs Lab 09/27/14 1032  TROPONINI <0.03   ------------------------------------------------------------------------------------------------------------------  RADIOLOGY:  Dg Chest 2 View  09/27/2014   CLINICAL DATA:  LEFT side chest pain beginning yesterday 1 lying down with shortness of breath, history smoking, COPD, CHF, diabetes, hypertension, hepatitis-C, coronary artery disease  EXAM: CHEST  2 VIEW  COMPARISON:  03/19/2014  FINDINGS: Normal heart size post  median sternotomy.  Mediastinal contours and pulmonary vascularity normal.  Lungs clear.  No pleural effusion or pneumothorax.  Bones unremarkable.  IMPRESSION: No acute abnormalities.   Electronically Signed   By: Lavonia Dana M.D.   On: 09/27/2014 15:13   EKG- RBBB  IMPRESSION AND PLAN:   * Chest pain. Resolved with sublingual nitroglycerin. Patient will be admitted to telemetry floor, check serial troponin. Aspirin, statin, beta blocker, nitroglycerin when necessary. We will consult cardiology for further input due to typical pain.  * HTN Continue home medications  * Diabetes mellitus 2 Home meds. SSI. ADA  * Tobacco abuse Counseled to quit    All the records are reviewed and case discussed with ED provider. Management plans discussed with the patient, family and they are in agreement.  CODE STATUS: FULL CODE  TOTAL TIME TAKING CARE OF THIS PATIENT: 45 minutes.    Hillary Bow R M.D on 09/27/2014 at 3:53 PM  Between 7am to 6pm - Pager - [phone]  After 6pm go to [email or web address] - password [email or web address] [facility]  Office  [phone]  CC: Primary care physician; Rubbie Battiest, NP

## 2014-09-27 NOTE — Care Management (Signed)
Medicare obs letter reviewed with patient and sent copy to medical records

## 2014-09-27 NOTE — ED Notes (Signed)
Pt reports left sided chest pain starting yesterday while lying down with shortness of breath. Pt with hx of smoking. No acute distress noted in triage.

## 2014-09-28 ENCOUNTER — Encounter (HOSPITAL_BASED_OUTPATIENT_CLINIC_OR_DEPARTMENT_OTHER): Payer: Medicare Other

## 2014-09-28 ENCOUNTER — Encounter: Payer: Self-pay | Admitting: Physician Assistant

## 2014-09-28 DIAGNOSIS — R079 Chest pain, unspecified: Secondary | ICD-10-CM

## 2014-09-28 DIAGNOSIS — I25119 Atherosclerotic heart disease of native coronary artery with unspecified angina pectoris: Secondary | ICD-10-CM | POA: Diagnosis not present

## 2014-09-28 DIAGNOSIS — I25118 Atherosclerotic heart disease of native coronary artery with other forms of angina pectoris: Secondary | ICD-10-CM | POA: Diagnosis present

## 2014-09-28 DIAGNOSIS — Z87891 Personal history of nicotine dependence: Secondary | ICD-10-CM

## 2014-09-28 DIAGNOSIS — I10 Essential (primary) hypertension: Secondary | ICD-10-CM

## 2014-09-28 DIAGNOSIS — Z951 Presence of aortocoronary bypass graft: Secondary | ICD-10-CM

## 2014-09-28 DIAGNOSIS — I2510 Atherosclerotic heart disease of native coronary artery without angina pectoris: Secondary | ICD-10-CM

## 2014-09-28 DIAGNOSIS — R0789 Other chest pain: Secondary | ICD-10-CM | POA: Diagnosis not present

## 2014-09-28 DIAGNOSIS — E785 Hyperlipidemia, unspecified: Secondary | ICD-10-CM

## 2014-09-28 LAB — URINE DRUG SCREEN, QUALITATIVE (ARMC ONLY)
AMPHETAMINES, UR SCREEN: NOT DETECTED
Barbiturates, Ur Screen: NOT DETECTED
Benzodiazepine, Ur Scrn: NOT DETECTED
Cannabinoid 50 Ng, Ur ~~LOC~~: NOT DETECTED
Cocaine Metabolite,Ur ~~LOC~~: NOT DETECTED
MDMA (Ecstasy)Ur Screen: NOT DETECTED
Methadone Scn, Ur: NOT DETECTED
Opiate, Ur Screen: NOT DETECTED
PHENCYCLIDINE (PCP) UR S: NOT DETECTED
Tricyclic, Ur Screen: NOT DETECTED

## 2014-09-28 LAB — GLUCOSE, CAPILLARY
GLUCOSE-CAPILLARY: 98 mg/dL (ref 65–99)
GLUCOSE-CAPILLARY: 140 mg/dL — AB (ref 65–99)
Glucose-Capillary: 112 mg/dL — ABNORMAL HIGH (ref 65–99)
Glucose-Capillary: 61 mg/dL — ABNORMAL LOW (ref 65–99)
Glucose-Capillary: 162 mg/dL — ABNORMAL HIGH (ref 65–99)

## 2014-09-28 LAB — TROPONIN I: Troponin I: <0.03 ng/mL (ref <0.031)

## 2014-09-28 MED ORDER — REGADENOSON 0.4 MG/5ML IV SOLN
0.4000 mg | Freq: Once | INTRAVENOUS | Status: AC
  Administered 2014-09-28: 0.4 mg via INTRAVENOUS
  Filled 2014-09-28: qty 5

## 2014-09-28 MED ORDER — TECHNETIUM TC 99M SESTAMIBI - CARDIOLITE
30.0000 | Freq: Once | INTRAVENOUS | Status: AC | PRN
  Administered 2014-09-28: 15:00:00 28.56 via INTRAVENOUS

## 2014-09-28 MED ORDER — TECHNETIUM TC 99M SESTAMIBI - CARDIOLITE
13.0270 | Freq: Once | INTRAVENOUS | Status: AC | PRN
  Administered 2014-09-28: 14:00:00 13.027 via INTRAVENOUS

## 2014-09-28 MED ORDER — ATORVASTATIN CALCIUM 20 MG PO TABS
40.0000 mg | ORAL_TABLET | Freq: Every day | ORAL | Status: DC
  Administered 2014-09-28: 40 mg via ORAL
  Filled 2014-09-28: qty 2

## 2014-09-28 MED FILL — Insulin Aspart Prot & Aspart (Human) Inj 100 Unit/ML (70-30): SUBCUTANEOUS | Qty: 0.2 | Status: AC

## 2014-09-28 MED FILL — Insulin Aspart Prot & Aspart (Human) Inj 100 Unit/ML (70-30): SUBCUTANEOUS | Qty: 0.3 | Status: AC

## 2014-09-28 NOTE — Progress Notes (Addendum)
Inpatient Diabetes Program Recommendations  AACE/ADA: New Consensus Statement on Inpatient Glycemic Control (2013)  Target Ranges:  Prepandial:   less than 140 mg/dL      Peak postprandial:   less than 180 mg/dL (1-2 hours)      Critically ill patients:  140 - 180 mg/dL   Note Hypoglycemia.   Results for Kyle [name], Kyle [name] (MRN [id number]) as of 09/28/2014 13:26  Ref. Range 09/27/2014 17:21 09/27/2014 19:44 09/27/2014 21:46 09/28/2014 07:43 09/28/2014 11:00  Glucose-Capillary Latest Ref Range: 65-99 mg/dL 156 (H) 59 (L) 82 112 (H) 98   Consider reducing 70/30 to 25 units q AM and 15 units q PM.  Thanks, Adah Perl, RN, BC-ADM Inpatient Diabetes Coordinator Pager [phone] (8a-5p)

## 2014-09-28 NOTE — Progress Notes (Signed)
[place] at [place] NAME: Kyle [name]    MR#:  [id number]  DATE OF BIRTH:  June 06, 1960  SUBJECTIVE:  CHIEF COMPLAINT:   Chief Complaint  Patient presents with  . Chest Pain   Chest pain improved since admission. Still has a sharp pain in the left chest occasionally. No shortness of breath  REVIEW OF SYSTEMS:   Review of Systems  Constitutional: Negative for fever.  Respiratory: Negative for cough, shortness of breath and wheezing.   Cardiovascular: Positive for chest pain. Negative for palpitations and orthopnea.  Gastrointestinal: Negative for nausea, vomiting and abdominal pain.  Genitourinary: Negative for dysuria.    DRUG ALLERGIES:   Allergies  Allergen Reactions  . Amoxicillin Itching  . Flexeril [Cyclobenzaprine]     rash  . Methadone     Reaction: Gi upset  . Tramadol   . Trazodone And Nefazodone     Reaction: GI upset    VITALS:  Blood pressure 137/87, pulse 72, temperature 98 F (36.7 C), temperature source Oral, resp. rate 18, height 5\' 7"  (1.702 m), weight 74.435 kg (164 lb 1.6 oz), SpO2 97 %.  PHYSICAL EXAMINATION:  GENERAL:  54 y.o.-year-old patient lying in the bed with no acute distress.  EYES: Pupils equal, round, reactive to light and accommodation. No scleral icterus. Extraocular muscles intact.  HEENT: Head atraumatic, normocephalic. Oropharynx and nasopharynx clear.  NECK:  Supple, no jugular venous distention. No thyroid enlargement, no tenderness.  LUNGS: Normal breath sounds bilaterally, no wheezing, rales,rhonchi or crepitation. No use of accessory muscles of respiration.  CARDIOVASCULAR: S1, S2 normal. No murmurs, rubs, or gallops.  ABDOMEN: Soft, nontender, nondistended. Bowel sounds present. No organomegaly or mass.  EXTREMITIES: No pedal edema, cyanosis, or clubbing.  NEUROLOGIC: Cranial nerves II through XII are intact. Muscle strength 5/5 in all extremities. Sensation intact. Gait  not checked.  PSYCHIATRIC: The patient is alert and oriented x 3.  SKIN: No obvious rash, lesion, or ulcer.    LABORATORY PANEL:   CBC  Recent Labs Lab 09/27/14 1032  WBC 13.1*  HGB 15.6  HCT 48.4  PLT 124*   ------------------------------------------------------------------------------------------------------------------  Chemistries   Recent Labs Lab 09/27/14 1032  NA 138  K 4.3  CL 104  CO2 22  GLUCOSE 134*  BUN 11  CREATININE 0.64  CALCIUM 9.4   ------------------------------------------------------------------------------------------------------------------  Cardiac Enzymes  Recent Labs Lab 09/28/14 0340  TROPONINI <0.03   ------------------------------------------------------------------------------------------------------------------  RADIOLOGY:  Dg Chest 2 View  09/27/2014   CLINICAL DATA:  LEFT side chest pain beginning yesterday 1 lying down with shortness of breath, history smoking, COPD, CHF, diabetes, hypertension, hepatitis-C, coronary artery disease  EXAM: CHEST  2 VIEW  COMPARISON:  03/19/2014  FINDINGS: Normal heart size post median sternotomy.  Mediastinal contours and pulmonary vascularity normal.  Lungs clear.  No pleural effusion or pneumothorax.  Bones unremarkable.  IMPRESSION: No acute abnormalities.   Electronically Signed   By: Lavonia Dana M.D.   On: 09/27/2014 15:13    EKG:   Orders placed or performed during the hospital encounter of 09/27/14  . ED EKG (<28mins upon arrival to the ED)  . ED EKG (<17mins upon arrival to the ED)  . EKG 12-Lead  . EKG 12-Lead  . EKG 12-Lead  . EKG 12-Lead    ASSESSMENT AND PLAN:   Principal Problem:   Chest pain with moderate risk for cardiac etiology Active Problems:   Former smoker   Essential hypertension  Hyperlipidemia with target LDL less than 70   Coronary artery disease involving native coronary artery   S/P CABG x 4  Problem #1 chest pain in high-risk patient history of coronary  artery disease and CABG: - Appreciate cardiology consultation, plan for Myoview this afternoon - Continue aspirin, beta blocker, statin - UDS is negative  #2 hypertension - Controlled. Continue metoprolol  #3 diabetes mellitus type 2 - Continue sliding scale insulin - Check A1c  #4 tobacco abuse: Counseled to quit. He feels ready to quit.  All the records are reviewed and case discussed with Care Management/Social Workerr. Management plans discussed with the patient, family and they are in agreement.  CODE STATUS: Full  TOTAL TIME TAKING CARE OF THIS PATIENT: 35 minutes.   POSSIBLE D/C IN 1 DAYS, DEPENDING ON CLINICAL CONDITION. Pending results of Myoview today   Myrtis Ser M.D on 09/28/2014 at 2:57 PM  Between 7am to 6pm - Pager - [phone]  After 6pm go to [email or web address] - password [email or web address] [facility]  Office  [phone]  CC: Primary care physician; Rubbie Battiest, NP

## 2014-09-28 NOTE — Consult Note (Signed)
Cardiology Consultation Note  Patient ID: Kyle [name], MRN: [id number], DOB/AGE: 1960/08/22 54 y.o. Admit date: 09/27/2014   Date of Consult: 09/28/2014 Primary Physician: Rubbie Battiest, NP Primary Cardiologist: New to [facility]  Chief Complaint: Chest pain Reason for Consult: Chest pain  HPI: 54 y.o. male with h/o CAD s/p 4 vessel CABG in 10/2012 at the [facility], IDDM, HTN, HLD, hepatitis C, COPD, ongoing tobacco abuse, and chronic back pain who presented to [facility] on 5/31 with intermittent chest pain that began on 5/30.   He has known CAD s/p 4 vessel CABG in 2014 after undergoing a LHC that showed severe 3 vessel CAD. He was last seen by a cardiologist in 02/2013. His bypass was performed at the [facility] in [place], [place]. Records indicate LIMA-LAD, SVG-ramus, SVG-OM, SVG PDA. RIMA was found to be atretic and could not be used for the Ramus graft. He has not undergone any ischemic testing since that time. No documented echos on file. He has not had any angina since his CABG up until his resting chest pain that occurred on 5/31. He does not some SOB/nasal congestion and rhinorrhea when the pollen count is elevated, otherwise he denies any SOB. He continues to smoke tobacco at 1 pack per day and has done so for the past 20+ years. He drinks at least 5-6 beers plus 1 pint of liqueur each weekend. He does not drink during the week. He has a remote history of illicit drug abuse with crack/cocaine 10+ years ago, but denies any recent abuse.   He presented to [facility] on 5/31 with the development of left sided chest pain that occurred after rolling over in his sleep and lasted for 3-4 minutes before self resolving. Pain with radiation to the left shoulder and jaw. There was possibly some associated mild SOB and nausea. Otherwise he denies any diaphoresis, vomiting, presyncope, or syncope. No palpitations. Because of his pain he presented to [facility] for further evaluation.    Upon his  arrival to [facility] he was found to have troponin negative x 4, CXR showed no acute abnormalities, EKG showed NSR, 81 bpm, RBBB, left anterior fascicular block, bifascicular block, nonspecific inferolateral st/t changes, WBC 13.1, SCr 0.64. He has been chest pain free since his arrival. He was continued on his aspirin, statin, beta blocker, and SL NTG prn.       Past Medical History  Diagnosis Date  . Asthma   . DM2 (diabetes mellitus, type 2)   . COPD (chronic obstructive pulmonary disease)   . Fainting spell   . GERD (gastroesophageal reflux disease)   . Allergy   . Hyperlipidemia   . Hypertension   . Urine incontinence   . CAD (coronary artery disease)     a. 4 vessel CABG 10/2012 at [facility]   . Hepatitis C     2/2 tattoo  . Chronic back pain 1981  . DDD (degenerative disc disease)       Most Recent Cardiac Studies: CABG 2014:  LIMA-LAD SVG-R1 SVG-OM SVG-PDA   Surgical History:  Past Surgical History  Procedure Laterality Date  . 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 Cardiac catheterization  11/2012    [place] Meds: Prior to Admission medications   Medication Sig Start Date End Date Taking? Authorizing Provider  albuterol (PROVENTIL HFA;VENTOLIN HFA) 108 (90 BASE) MCG/ACT inhaler Inhale 2 puffs  into the lungs as needed. 09/08/14  Yes Rubbie Battiest, NP  aspirin 325 MG tablet Take 1 tablet (325 mg total) by mouth daily. 06/04/13  Yes Raquel Dagoberto Ligas, NP  gabapentin (NEURONTIN) 800 MG tablet Take 800 mg by mouth 3 (three) times daily.    Yes Historical Provider, MD  glucose blood test strip OneTouch ultra mini test strips. Use 4 times daily. Dx 250.0 06/07/14  Yes Radhika Concha Se, MD  hydrOXYzine (ATARAX/VISTARIL) 25 MG tablet Take 1 tablet (25 mg total) by mouth every 8 (eight) hours as needed for itching. 07/13/14  Yes Rubbie Battiest, NP  insulin NPH-regular Human (NOVOLIN 70/30) (70-30) 100 UNIT/ML injection 30 units in the AM  and 20 units with evening meal 05/04/13  Yes Raquel Dagoberto Ligas, NP  Insulin Syringes, Disposable, U-100 1 ML MISC To inject 70/30 bid 05/04/13  Yes Raquel Dagoberto Ligas, NP  Lancets (ACCU-CHEK SOFT TOUCH) lancets Use as instructed 05/17/14  Yes Radhika Concha Se, MD  LANCETS ULTRA FINE MISC To check blood sugar four times daily 05/04/13  Yes Raquel Dagoberto Ligas, NP  metFORMIN (GLUCOPHAGE) 1000 MG tablet Take 1 tablet (1,000 mg total) by mouth 2 (two) times daily with a meal. 09/13/13  Yes Raquel Dagoberto Ligas, NP  methocarbamol (ROBAXIN) 750 MG tablet Take 1 tablet (750 mg total) by mouth 3 (three) times daily as needed for muscle spasms. 09/08/14  Yes Rubbie Battiest, NP  metoprolol tartrate (LOPRESSOR) 25 MG tablet Take 1.5 tablets (37.5 mg total) by mouth 2 (two) times daily. 09/03/13  Yes Minna Merritts, MD  omeprazole (PRILOSEC) 20 MG capsule Take 1 capsule (20 mg total) by mouth daily. 04/16/13  Yes Raquel Dagoberto Ligas, NP  Oxycodone HCl 10 MG TABS Take 1 tablet (10 mg total) by mouth daily. Fill on or after May 12th, 2016 09/08/14  Yes Rubbie Battiest, NP  sildenafil (VIAGRA) 100 MG tablet Take 100 mg by mouth daily as needed for erectile dysfunction.   Yes Historical Provider, MD  simvastatin (ZOCOR) 40 MG tablet Take 1 tablet (40 mg total) by mouth at bedtime. 09/08/14  Yes Rubbie Battiest, NP    Inpatient Medications:  . aspirin EC  325 mg Oral Daily  . enoxaparin (LOVENOX) injection  40 mg Subcutaneous Q24H  . gabapentin  800 mg Oral TID  . insulin aspart  0-15 Units Subcutaneous TID WC  . insulin aspart  0-5 Units Subcutaneous QHS  . insulin aspart protamine- aspart  20 Units Subcutaneous Q supper  . insulin aspart protamine- aspart  30 Units Subcutaneous Q breakfast  . metFORMIN  1,000 mg Oral BID WC  . metoprolol tartrate  37.5 mg Oral BID  . oxyCODONE  10 mg Oral Daily  . pantoprazole  40 mg Oral Daily  . simvastatin  40 mg Oral QHS  . sodium chloride  3 mL Intravenous Q12H      Allergies:  Allergies  Allergen Reactions    . Amoxicillin Itching  . Flexeril [Cyclobenzaprine]     rash  . Methadone     Reaction: Gi upset  . Tramadol   . Trazodone And Nefazodone     Reaction: GI upset    History   Social History  . Marital Status: Single    Spouse Name: N/A  . Number of Children: N/A  . Years of Education: 14   Occupational History  . 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 Smoking status: Current Every Day Smoker -- 35 years    Types: Cigarettes    Last Attempt to Quit: 11/24/2012  . Smokeless tobacco: Never Used  . Alcohol Use: Yes     Comment: occasional  . Drug Use: No  . Sexual Activity: Not on file   Other Topics Concern  . Not on file   Social History Narrative   Mr. Greenhaw grew up in the [place] area. He was in the [employer] for 4 years. When he returned home, he worked in [employer] and [employer] up until he had a heart attack. He is currently disabled. Mr. Reiling lives by himself but has excellent support system. He is very close to his family. He is currently taking online courses through [employer]. He is trying to obtain a degree in social services and ministry. His goal is to work with at-risk young adults.      Regular exercise: not recently   Caffeine use: occasionally     Family History  Problem Relation Age of Onset  . Heart disease Mother   . Heart disease Father   . Hypertension Father   . Diabetes Sister   . Cancer Sister     skin cancer  . Diabetes Other   . Heart disease Other      Review of Systems: Review of Systems  Constitutional: Negative for fever, chills, weight loss, malaise/fatigue and diaphoresis.  HENT: Negative for hearing loss.   Eyes: Negative for pain, discharge and redness.  Respiratory: Positive for shortness of breath. Negative for cough, hemoptysis, sputum production and wheezing.   Cardiovascular: Positive for chest pain. Negative for palpitations, orthopnea, claudication, leg  swelling and PND.  Gastrointestinal: Positive for nausea. Negative for heartburn and vomiting.  Musculoskeletal: Negative for falls.  Skin: Negative for rash.  Neurological: Negative for sensory change, speech change, focal weakness, loss of consciousness and weakness.  Psychiatric/Behavioral: The patient is not nervous/anxious.   All other systems reviewed and are negative.    Labs:  Recent Labs  09/27/14 1454 09/27/14 1708 09/27/14 2231 09/28/14 0340  TROPONINI <0.03 <0.03 <0.03 <0.03   Lab Results  Component Value Date   WBC 13.1* 09/27/2014   HGB 15.6 09/27/2014   HCT 48.4 09/27/2014   MCV 87.8 09/27/2014   PLT 124* 09/27/2014     Recent Labs Lab 09/21/14 1039 09/27/14 1032  NA 136 138  K 4.2 4.3  CL 103 104  CO2 24 22  BUN 12 11  CREATININE 0.78 0.64  CALCIUM 9.5 9.4  PROT 7.4  --   BILITOT 0.7  --   ALKPHOS 72  --   ALT 91*  --   AST 63*  --   GLUCOSE 162* 134*   Lab Results  Component Value Date   CHOL 135 11/24/2013   HDL 34.50* 11/24/2013   TRIG 238.0* 11/24/2013   No results found for: DDIMER  Radiology/Studies:  Dg Chest 2 View  09/27/2014   CLINICAL DATA:  LEFT side chest pain beginning yesterday 1 lying down with shortness of breath, history smoking, COPD, CHF, diabetes, hypertension, hepatitis-C, coronary artery disease  EXAM: CHEST  2 VIEW  COMPARISON:  03/19/2014  FINDINGS: Normal heart size post median sternotomy.  Mediastinal contours and pulmonary vascularity normal.  Lungs clear.  No pleural effusion or pneumothorax.  Bones unremarkable.  IMPRESSION: No acute abnormalities.   Electronically Signed   By: Lavonia Dana M.D.   On: 09/27/2014 15:13  Dg Cervical Spine 2-3 Views  09/11/2014   CLINICAL DATA:  Generalized back pain since 1980.  No new injury.  EXAM: CERVICAL SPINE - 2-3 VIEW  COMPARISON:  July 05, 2014  FINDINGS: There are degenerative joint changes at C4-5 with narrowed joint space and osteophyte formation. There is mild  narrowed joint space between C5-6. The findings are stable since prior exam. There is no acute fracture dislocation. The prevertebral soft tissues are normal.  IMPRESSION: There is no acute fracture dislocation. Degenerative joint changes in the mid to lower cervical spine.   Electronically Signed   By: Abelardo Diesel M.D.   On: 09/11/2014 15:28   Dg Thoracic Spine 2 View  09/11/2014   CLINICAL DATA:  Generalized thoracic pain chronically.  No injury.  EXAM: THORACIC SPINE - 2 VIEW  COMPARISON:  Chest x-ray 03/19/2014  FINDINGS: Minimal spondylosis of the thoracic spine. Vertebral body alignment, heights and disc space heights are within normal. There is no significant compression fracture or subluxation. Pedicles are intact. Sternotomy wires are unchanged.  IMPRESSION: No acute findings.  Mild spondylosis of the thoracic spine.   Electronically Signed   By: Marin Olp M.D.   On: 09/11/2014 15:29    EKG: NSR, 81 bpm, RBBB, left anterior fascicular block, bifascicular block, nonspecific inferolateral st/t changes  Weights: Filed Weights   09/27/14 1018 09/27/14 1701 09/28/14 0424  Weight: 170 lb (77.111 kg) 159 lb 9.6 oz (72.394 kg) 164 lb 1.6 oz (74.435 kg)     Physical Exam: Blood pressure 135/95, pulse 69, temperature 98.8 F (37.1 C), temperature source Oral, resp. rate 19, height 5\' 7"  (1.702 m), weight 164 lb 1.6 oz (74.435 kg), SpO2 97 %. Body mass index is 25.7 kg/(m^2). General: Well developed, well nourished, in no acute distress. Head: Normocephalic, atraumatic, sclera non-icteric, no xanthomas, nares are without discharge.  Neck: Negative for carotid bruits. JVD not elevated. Lungs: Clear bilaterally to auscultation without wheezes, rales, or rhonchi. Breathing is unlabored. Heart: RRR with S1 S2. No murmurs, rubs, or gallops appreciated. Abdomen: Soft, non-tender, non-distended with normoactive bowel sounds. No hepatomegaly. No rebound/guarding. No obvious abdominal masses. Msk:   Strength and tone appear normal for age. Extremities: No clubbing or cyanosis. No edema.  Distal pedal pulses are 2+ and equal bilaterally. Neuro: Alert and oriented X 3. No facial asymmetry. No focal deficit. Moves all extremities spontaneously. Psych:  Responds to questions appropriately with a normal affect.    Assessment and Plan:  54 y.o. male with h/o CAD s/p 4 vessel CABG in 10/2012, DM2, HTN, HLD, hepatitis C, COPD, and chronic back pain who presented to [facility] on 5/31 with intermittent chest pain that began on 5/30.  1. Unstable angina: -He has both some typical and atypical presenting symptoms with pain that occurred at rest, but radiates to the left shoulder and to the left jaw -He has not had any ischemic evaluations since his CABG -First episode of angina since bypass -Schedule for Lexiscan Myoview this afternoon to evaluate for high risk ischemic as he has chest pain with moderate risk for cardiac etiology  -Continue aspirin, Lopressor, simvastatin (possibly on [place] formulary) -Check UDS given remote history of crack/cocaine abuse  2. IDDM: -Continue SSI per IM  3. COPD: -Stable  4. HLD: -Continue simvastatin   5. Hepatitis C  6. Chronic back pain: -Stable   Signed, Christell Faith, PA-C Pager: [phone] 09/28/2014, 8:12 AM

## 2014-09-28 NOTE — Care Management Note (Signed)
Case Management Note  Patient Details  Name: Kyle [name] MRN: [id number] Date of Birth: 1960-09-07                  Continues to c/o chest pain. Myoview this afternoon.      Expected Discharge Date:                  Expected Discharge Plan:     In-House Referral:     Discharge planning Services     Post Acute Care Choice:    Choice offered to:     DME Arranged:    DME Agency:     HH Arranged:    [place] Agency:     Status of Service:     Medicare Important Message Given:    Date Medicare IM Given:    Medicare IM give by:    Date Additional Medicare IM Given:    Additional Medicare Important Message give by:     If discussed at [place] of Stay Meetings, dates discussed:    Additional Comments:  Chereese Cilento A, RN 09/28/2014, 3:27 PM

## 2014-09-29 ENCOUNTER — Encounter: Payer: Self-pay | Admitting: Physician Assistant

## 2014-09-29 ENCOUNTER — Telehealth: Payer: Self-pay

## 2014-09-29 DIAGNOSIS — R0789 Other chest pain: Secondary | ICD-10-CM | POA: Diagnosis not present

## 2014-09-29 LAB — NM MYOCAR MULTI W/SPECT W/WALL MOTION / EF
CHL CUP NUCLEAR SSS: 8
CHL CUP STRESS STAGE 1 HR: 61 {beats}/min
CHL CUP STRESS STAGE 2 GRADE: 0 %
CHL CUP STRESS STAGE 3 SPEED: 0 mph
CHL CUP STRESS STAGE 4 GRADE: 0 %
CHL CUP STRESS STAGE 4 SPEED: 0 mph
CHL CUP STRESS STAGE 5 HR: 81 {beats}/min
CHL CUP STRESS STAGE 5 SBP: 121 mmHg
CHL CUP STRESS STAGE 5 SPEED: 0 mph
Estimated workload: 1 METS
LV dias vol: 104 mL
LV sys vol: 54 mL
Peak HR: 69 {beats}/min
Percent of predicted max HR: 41 %
SDS: 2
SRS: 14
Stage 2 HR: 60 {beats}/min
Stage 2 Speed: 0 mph
Stage 3 Grade: 0 %
Stage 3 HR: 61 {beats}/min
Stage 4 HR: 69 {beats}/min
Stage 5 DBP: 82 mmHg
Stage 5 Grade: 0 %
TID: 0.9

## 2014-09-29 LAB — HEMOGLOBIN A1C: Hgb A1c MFr Bld: 9.5 % — ABNORMAL HIGH (ref 4.0–6.0)

## 2014-09-29 LAB — GLUCOSE, CAPILLARY
Glucose-Capillary: 139 mg/dL — ABNORMAL HIGH (ref 65–99)
Glucose-Capillary: 69 mg/dL (ref 65–99)

## 2014-09-29 MED ORDER — NITROGLYCERIN 0.4 MG SL SUBL: 0.4000 mg | SUBLINGUAL_TABLET | SUBLINGUAL | Status: DC | PRN

## 2014-09-29 MED ORDER — ATORVASTATIN CALCIUM 40 MG PO TABS: 40.0000 mg | ORAL_TABLET | Freq: Every day | ORAL | Status: DC

## 2014-09-29 NOTE — Progress Notes (Addendum)
Pt discharged to home.  Discussed follow up appointments, prescriptions given to patient.  Educated on angina, chest pain and aspirin.  Pt voiced understanding.  Lynnda Shields, RN  Tele and IV removed from pt.  Lynnda Shields, RN

## 2014-09-29 NOTE — Discharge Instructions (Signed)
°  DIET:  Cardiac diet, Diabetic diet and Low fat, Low cholesterol diet  DISCHARGE CONDITION:  Fair  ACTIVITY:  Activity as tolerated  OXYGEN:  Home Oxygen: No.   Oxygen Delivery: room air  DISCHARGE LOCATION:  home   If you experience worsening of your admission symptoms, develop shortness of breath, life threatening emergency, suicidal or homicidal thoughts you must seek medical attention immediately by calling 911 or calling your MD immediately  if symptoms less severe.  You Must read complete instructions/literature along with all the possible adverse reactions/side effects for all the Medicines you take and that have been prescribed to you. Take any new Medicines after you have completely understood and accpet all the possible adverse reactions/side effects.   Please note  You were cared for by a hospitalist during your hospital stay. If you have any questions about your discharge medications or the care you received while you were in the hospital after you are discharged, you can call the unit and asked to speak with the hospitalist on call if the hospitalist that took care of you is not available. Once you are discharged, your primary care physician will handle any further medical issues. Please note that NO REFILLS for any discharge medications will be authorized once you are discharged, as it is imperative that you return to your primary care physician (or establish a relationship with a primary care physician if you do not have one) for your aftercare needs so that they can reassess your need for medications and monitor your lab values.

## 2014-09-29 NOTE — Telephone Encounter (Signed)
Appt has been scheduled within 48 hours, TCM call not needed if pt keeps appt. Will call him 10/03/14 AM to confirm he is coming to appt and to complete TCM call if needed.

## 2014-09-29 NOTE — Telephone Encounter (Signed)
[facility] called and made an apt for Monday for the pt's hospital follow up apt with C.Doss - the pt is being d/c today (6/2)

## 2014-09-29 NOTE — Progress Notes (Signed)
Pt remain alert and verbally responsive, no visible sign of distress noted, no c/o pain or discomfort.Ambulates without assist to bathroom. Resting quietly in bed at this time,call light within reach.

## 2014-10-03 ENCOUNTER — Encounter: Payer: Self-pay | Admitting: Nurse Practitioner

## 2014-10-03 ENCOUNTER — Ambulatory Visit (INDEPENDENT_AMBULATORY_CARE_PROVIDER_SITE_OTHER): Payer: Medicare Other | Admitting: Nurse Practitioner

## 2014-10-03 VITALS — BP 144/82 | HR 93 | Temp 97.7°F | Resp 14 | Ht 67.0 in | Wt 167.8 lb

## 2014-10-03 DIAGNOSIS — Z09 Encounter for follow-up examination after completed treatment for conditions other than malignant neoplasm: Secondary | ICD-10-CM

## 2014-10-03 DIAGNOSIS — R079 Chest pain, unspecified: Secondary | ICD-10-CM | POA: Diagnosis not present

## 2014-10-03 DIAGNOSIS — M542 Cervicalgia: Secondary | ICD-10-CM | POA: Diagnosis not present

## 2014-10-03 MED ORDER — MICONAZOLE NITRATE 2 % EX CREA: 1.0000 "application " | TOPICAL_CREAM | Freq: Two times a day (BID) | CUTANEOUS | Status: DC

## 2014-10-03 MED ORDER — OXYCODONE HCL 10 MG PO TABS: 10.0000 mg | ORAL_TABLET | Freq: Every day | ORAL | Status: DC

## 2014-10-03 NOTE — Progress Notes (Signed)
Pre visit review using our clinic review tool, if applicable. No additional management support is needed unless otherwise documented below in the visit note. 

## 2014-10-03 NOTE — Patient Instructions (Addendum)
Try the miconazole 2% cream thin layer twice daily to corners of mouth.   We will contact you about your referral to pain management.

## 2014-10-03 NOTE — Progress Notes (Signed)
Subjective:    Patient ID: Kyle [name], male    DOB: 08-24-1960, 54 y.o.   MRN: [id number]  HPI  [name] is a 54 yo male with a hospital follow up from d/c on 09/29/14 from [facility].   1) Kept appointment for 48 hrs past d/c from [facility].   Dx: Chest pain w/ mod risk for cardiac etiology Negative serial troponins, telemetry was normal limits, EKG- no changes, Myoview low risk, UDS negative. Stopped smoking during hospital stay.   Patient report- Over lifting, strained, left arm pain, SOB  2) Still having neck pain- x-ray cervical and thoracic spondylosis and degeneration found on x-ray. Referral to Dr. Primus Bravo  Review of Systems  Constitutional: Negative for fever, chills, diaphoresis and fatigue.  Eyes: Negative for visual disturbance.  Respiratory: Negative for chest tightness, shortness of breath and wheezing.   Cardiovascular: Negative for chest pain, palpitations and leg swelling.  Musculoskeletal: Positive for myalgias, back pain and neck pain. Negative for joint swelling, arthralgias, gait problem and neck stiffness.  Neurological: Negative for dizziness, weakness and numbness.  Psychiatric/Behavioral: The patient is not nervous/anxious.    Past Medical History  Diagnosis Date  . Asthma   . DM2 (diabetes mellitus, type 2)   . COPD (chronic obstructive pulmonary disease)   . Fainting spell   . GERD (gastroesophageal reflux disease)   . Allergy   . Hyperlipidemia with target LDL less than 70   . Hypertension   . Urine incontinence   . Coronary artery disease involving native coronary artery     a. 4 vessel CABG 10/2012 at [facility]; b. [facility] 08/2014: small defect of mild severity present along apical lateral & apex location, no st segment changes,  LV function 45-54%, poor gated images lead to poor EF & wall motion assessment, low risk study  . S/P CABG x 4     a. LIMA-LAD, SVG-Ramus, SVG-OM,SVG-dRCA  . Chronic back pain 1981  . DDD (degenerative disc  disease)   . Hepatitis C     2/2 tattoo  . Polysubstance abuse     a. ongoing daily tobacco abuse and etoh (on the weekends), remote crack/cocaine abuse last used approximately in 2006    History   Social History  . Marital Status: Single    Spouse Name: N/A  . Number of Children: N/A  . Years of Education: 14   Occupational History  . 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 Smoking status: Current Every Day Smoker -- 35 years    Types: Cigarettes    Last Attempt to Quit: 11/24/2012  . Smokeless tobacco: Never Used  . Alcohol Use: Yes     Comment: occasional  . Drug Use: No  . Sexual Activity: Not on file   Other Topics Concern  . Not on file   Social History Narrative   [name] grew up in the [place] area. He was in the [employer] for 4 years. When he returned home, he worked in [employer] up until he had a heart attack. He is currently disabled. [name] lives by himself but has excellent support system. He is very close to his family. He is currently taking online courses through [employer]. He is trying to obtain a degree in social services and ministry. His goal is to work with at-risk young adults.      Regular exercise: not recently  Caffeine use: occasionally    Past Surgical History  Procedure Laterality Date  . Coronary artery bypass graft  8/14    CABG x 4; LIMA-LAD, SVG-RI, SVG-OM1, SVG-rPDA (Atretic RIMA).  . Cardiac catheterization  11/2012    [facility] - Multivessel CAD    Family History  Problem Relation Age of Onset  . Heart disease Mother   . Heart disease Father   . Hypertension Father   . Diabetes Sister   . Cancer Sister     skin cancer  . Diabetes Other   . Heart disease Other     Allergies  Allergen Reactions  . Amoxicillin Itching  . Flexeril [Cyclobenzaprine]     rash  . Methadone     Reaction: Gi upset  . Tramadol   . Trazodone And Nefazodone      Reaction: GI upset    Current Outpatient Prescriptions on File Prior to Visit  Medication Sig Dispense Refill  . albuterol (PROVENTIL HFA;VENTOLIN HFA) 108 (90 BASE) MCG/ACT inhaler Inhale 2 puffs into the lungs as needed. 1 Inhaler 2  . aspirin 325 MG tablet Take 1 tablet (325 mg total) by mouth daily. 30 tablet 5  . atorvastatin (LIPITOR) 40 MG tablet Take 1 tablet (40 mg total) by mouth daily at 6 PM. 30 tablet 0  . gabapentin (NEURONTIN) 800 MG tablet Take 800 mg by mouth 3 (three) times daily.     Marland Kitchen glucose blood test strip OneTouch ultra mini test strips. Use 4 times daily. Dx 250.0 100 each 5  . hydrOXYzine (ATARAX/VISTARIL) 25 MG tablet Take 1 tablet (25 mg total) by mouth every 8 (eight) hours as needed for itching. 30 tablet 1  . insulin NPH-regular Human (NOVOLIN 70/30) (70-30) 100 UNIT/ML injection 32 units in the AM and 22 units with evening meal    . Insulin Syringes, Disposable, U-100 1 ML MISC To inject 70/30 bid 100 each 6  . Lancets (ACCU-CHEK SOFT TOUCH) lancets Use as instructed 100 each 5  . LANCETS ULTRA FINE MISC To check blood sugar four times daily 100 each 6  . metFORMIN (GLUCOPHAGE) 1000 MG tablet Take 1 tablet (1,000 mg total) by mouth 2 (two) times daily with a meal. 60 tablet 5  . methocarbamol (ROBAXIN) 750 MG tablet Take 1 tablet (750 mg total) by mouth 3 (three) times daily as needed for muscle spasms. 45 tablet 1  . metoprolol tartrate (LOPRESSOR) 25 MG tablet Take 1.5 tablets (37.5 mg total) by mouth 2 (two) times daily. 135 tablet 3  . nitroGLYCERIN (NITROSTAT) 0.4 MG SL tablet Place 1 tablet (0.4 mg total) under the tongue every 5 (five) minutes as needed for chest pain. 21 tablet 12  . omeprazole (PRILOSEC) 20 MG capsule Take 1 capsule (20 mg total) by mouth daily. 30 capsule 3  . sildenafil (VIAGRA) 100 MG tablet Take 100 mg by mouth daily as needed for erectile dysfunction.     No current facility-administered medications on file prior to visit.        Objective:   Physical Exam  Constitutional: He is oriented to person, place, and time. He appears well-developed and well-nourished. No distress.  BP 144/82 mmHg  Pulse 93  Temp(Src) 97.7 F (36.5 C) (Oral)  Resp 14  Ht 5\' 7"  (1.702 m)  Wt 167 lb 12.8 oz (76.114 kg)  BMI 26.28 kg/m2  SpO2 98%   HENT:  Head: Normocephalic and atraumatic.  Right Ear: External ear normal.  Left Ear: External  ear normal.  Mouth/Throat: Oropharynx is clear and moist.  Eyes: Conjunctivae and EOM are normal. Pupils are equal, round, and reactive to light. Right eye exhibits no discharge. Left eye exhibits no discharge. No scleral icterus.  Neck: Normal range of motion. Neck supple. No thyromegaly present.  Cardiovascular: Normal rate and regular rhythm.   Murmur heard. Pulmonary/Chest: Effort normal and breath sounds normal. No respiratory distress. He has no wheezes. He has no rales. He exhibits no tenderness.  Lymphadenopathy:    He has no cervical adenopathy.  Neurological: He is alert and oriented to person, place, and time.  Skin: Skin is warm and dry. No rash noted. He is not diaphoretic.  Psychiatric: He has a normal mood and affect. His behavior is normal. Judgment and thought content normal.      Assessment & Plan:

## 2014-10-03 NOTE — Telephone Encounter (Signed)
Pt here for appt.

## 2014-10-03 NOTE — Telephone Encounter (Signed)
Left message for pt, asking to call back to confirm that he will make appt today.

## 2014-10-05 NOTE — Discharge Summary (Signed)
[facility] [place]   PATIENT NAME: Kyle [name]    MR#:  [id number]  DATE OF BIRTH:  01/03/1961  DATE OF ADMISSION:  09/27/2014 ADMITTING PHYSICIAN: Hillary Bow, MD  DATE OF DISCHARGE: 09/29/2014 12:06 PM  PRIMARY CARE PHYSICIAN: Rubbie Battiest, NP    ADMISSION DIAGNOSIS:  Other chest pain [R07.89]  DISCHARGE DIAGNOSIS:  Principal Problem:   Chest pain with moderate risk for cardiac etiology Active Problems:   Former smoker   Essential hypertension   Hyperlipidemia with target LDL less than 70   Coronary artery disease involving native coronary artery   S/P CABG x 4   SECONDARY DIAGNOSIS:   Past Medical History  Diagnosis Date  . Asthma   . DM2 (diabetes mellitus, type 2)   . COPD (chronic obstructive pulmonary disease)   . Fainting spell   . GERD (gastroesophageal reflux disease)   . Allergy   . Hyperlipidemia with target LDL less than 70   . Hypertension   . Urine incontinence   . Coronary artery disease involving native coronary artery     a. 4 vessel CABG 10/2012 at [place]; b. [facility] 08/2014: small defect of mild severity present along apical lateral & apex location, no st segment changes,  LV function 45-54%, poor gated images lead to poor EF & wall motion assessment, low risk study  . S/P CABG x 4     a. LIMA-LAD, SVG-Ramus, SVG-OM,SVG-dRCA  . Chronic back pain 1981  . DDD (degenerative disc disease)   . Hepatitis C     2/2 tattoo  . Polysubstance abuse     a. ongoing daily tobacco abuse and etoh (on the weekends), remote crack/cocaine abuse last used approximately in 2006    HOSPITAL COURSE:   1) Chest pain in patient with history of CAD/CABG with multiple ongoing risk factors:  ACS ruled out with negative enzymes, no events on telemetry and no significant EKG changes.  He had a Myoview on day of discharge which was read as low risk for acute ischemic changes.  UDS  negative. He will continue on ASA, BB and statin. No chest pain at time of discharge.  Discharge to follow up with PCP.  #2 hypertension - Controlled. Continue metoprolol  #3 diabetes mellitus type 2: A1C is 9.4, high, but much improved from prior values.  Continue prior regimen of 70/30 and metformin. Close follow up with PCP for continued improvement of glucose control is needed.  #4 tobacco abuse: Counseled to quit daily during admission. He feels ready to quit.   DISCHARGE CONDITIONS:   fair  CONSULTS OBTAINED:    None  DRUG ALLERGIES:   Allergies  Allergen Reactions  . Amoxicillin Itching  . Flexeril [Cyclobenzaprine]     rash  . Methadone     Reaction: Gi upset  . Tramadol   . Trazodone And Nefazodone     Reaction: GI upset    DISCHARGE MEDICATIONS:   Discharge Medication List as of 09/29/2014 11:00 AM    START taking these medications   Details  atorvastatin (LIPITOR) 40 MG tablet Take 1 tablet (40 mg total) by mouth daily at 6 PM., Starting 09/29/2014, Until Discontinued, Print    nitroGLYCERIN (NITROSTAT) 0.4 MG SL tablet Place 1 tablet (0.4 mg total) under the tongue every 5 (five) minutes as needed for chest pain., Starting 09/29/2014, Until Discontinued, Print      CONTINUE these medications which have NOT  CHANGED   Details  albuterol (PROVENTIL HFA;VENTOLIN HFA) 108 (90 BASE) MCG/ACT inhaler Inhale 2 puffs into the lungs as needed., Starting 09/08/2014, Until Discontinued, Normal    aspirin 325 MG tablet Take 1 tablet (325 mg total) by mouth daily., Starting 06/04/2013, Until Discontinued, Normal    gabapentin (NEURONTIN) 800 MG tablet Take 800 mg by mouth 3 (three) times daily. , Until Discontinued, Historical Med    glucose blood test strip OneTouch ultra mini test strips. Use 4 times daily. Dx 250.0, Normal    hydrOXYzine (ATARAX/VISTARIL) 25 MG tablet Take 1 tablet (25 mg total) by mouth every 8 (eight) hours as needed for itching., Starting 07/13/2014,  Until Discontinued, Normal    insulin NPH-regular Human (NOVOLIN 70/30) (70-30) 100 UNIT/ML injection 30 units in the AM and 20 units with evening meal, Starting 05/04/2013, Until Discontinued, Historical Med    Insulin Syringes, Disposable, U-100 1 ML MISC To inject 70/30 bid, Print    !! Lancets (ACCU-CHEK SOFT TOUCH) lancets Use as instructed, Normal    !! LANCETS ULTRA FINE MISC To check blood sugar four times daily, Print    metFORMIN (GLUCOPHAGE) 1000 MG tablet Take 1 tablet (1,000 mg total) by mouth 2 (two) times daily with a meal., Starting 09/13/2013, Until Discontinued, Normal    methocarbamol (ROBAXIN) 750 MG tablet Take 1 tablet (750 mg total) by mouth 3 (three) times daily as needed for muscle spasms., Starting 09/08/2014, Until Discontinued, Normal    metoprolol tartrate (LOPRESSOR) 25 MG tablet Take 1.5 tablets (37.5 mg total) by mouth 2 (two) times daily., Starting 09/03/2013, Until Discontinued, Normal    omeprazole (PRILOSEC) 20 MG capsule Take 1 capsule (20 mg total) by mouth daily., Starting 04/16/2013, Until Discontinued, Normal    sildenafil (VIAGRA) 100 MG tablet Take 100 mg by mouth daily as needed for erectile dysfunction., Until Discontinued, Historical Med    Oxycodone HCl 10 MG TABS Take 1 tablet (10 mg total) by mouth daily. Fill on or after May 12th, 2016, Starting 09/08/2014, Until Discontinued, Print     !! - Potential duplicate medications found. Please discuss with provider.    STOP taking these medications     simvastatin (ZOCOR) 40 MG tablet          DISCHARGE INSTRUCTIONS:     DIET:  Cardiac diet, Diabetic diet and Low fat, Low cholesterol diet  DISCHARGE CONDITION:  Fair  ACTIVITY:  Activity as tolerated  OXYGEN:  Home Oxygen: No.   Oxygen Delivery: room air  DISCHARGE LOCATION:  home   If you experience worsening of your admission symptoms, develop shortness of breath, life threatening emergency, suicidal or homicidal thoughts you  must seek medical attention immediately by calling 911 or calling your MD immediately  if symptoms less severe.  You Must read complete instructions/literature along with all the possible adverse reactions/side effects for all the Medicines you take and that have been prescribed to you. Take any new Medicines after you have completely understood and accept all the possible adverse reactions/side effects.   Please note  You were cared for by a hospitalist during your hospital stay. If you have any questions about your discharge medications or the care you received while you were in the hospital after you are discharged, you can call the unit and asked to speak with the hospitalist on call if the hospitalist that took care of you is not available. Once you are discharged, your primary care physician will handle any further medical issues. Please  note that NO REFILLS for any discharge medications will be authorized once you are discharged, as it is imperative that you return to your primary care physician (or establish a relationship with a primary care physician if you do not have one) for your aftercare needs so that they can reassess your need for medications and monitor your lab values.    Today   CHIEF COMPLAINT:   Chief Complaint  Patient presents with  . Chest Pain    HISTORY OF PRESENT ILLNESS:   Kyle [name] is a 54 y.o. male with a known history of CAD/CABG, HTN, DM, Tobacco abuse here with chest pain. Started yesterday night at rest. No aggravating or relieving factors other than nitroglycerin here in the emergency room but seems to have improved his pain a little. Patient continues to smoke. Had CABG 2 years prior in [place]. Has seen [facility] cardiology once in the past for a routine follow-up and year prior. Presently his pain is resolved. No shortness of breath, nausea, lightheadedness, syncope, abdominal pain. Patient takes aspirin and statin at  home. No stress test or cardiac catheterization since his CABG.   VITAL SIGNS:  Blood pressure 148/92, pulse 78, temperature 98.1 F (36.7 C), temperature source Oral, resp. rate 16, height 5\' 7"  (1.702 m), weight 74.889 kg (165 lb 1.6 oz), SpO2 97 %.  I/O:  No intake or output data in the 24 hours ending 10/05/14 2107  PHYSICAL EXAMINATION:  GENERAL:  55 y.o.-year-old patient lying in the bed with no acute distress.  EYES: Pupils equal, round, reactive to light and accommodation. No scleral icterus. Extraocular muscles intact.  HEENT: Head atraumatic, normocephalic. Oropharynx and nasopharynx clear.  NECK:  Supple, no jugular venous distention. No thyroid enlargement, no tenderness.  LUNGS: Normal breath sounds bilaterally, no wheezing, rales,rhonchi or crepitation. No use of accessory muscles of respiration.  CARDIOVASCULAR: S1, S2 normal. No murmurs, rubs, or gallops.  ABDOMEN: Soft, non-tender, non-distended. Bowel sounds present. No organomegaly or mass.  EXTREMITIES: No pedal edema, cyanosis, or clubbing.  NEUROLOGIC: Cranial nerves II through XII are intact. Muscle strength 5/5 in all extremities. Sensation intact. Gait not checked.  PSYCHIATRIC: The patient is alert and oriented x 3.  SKIN: No obvious rash, lesion, or ulcer.   DATA REVIEW:   CBC No results for input(s): WBC, HGB, HCT, PLT in the last 168 hours.  Chemistries  No results for input(s): NA, K, CL, CO2, GLUCOSE, BUN, CREATININE, CALCIUM, MG, AST, ALT, ALKPHOS, BILITOT in the last 168 hours.  Invalid input(s): GFRCGP  Cardiac Enzymes No results for input(s): TROPONINI in the last 168 hours.  Microbiology Results  No results found for this or any previous visit.  RADIOLOGY:  No results found.  EKG:   Orders placed or performed during the hospital encounter of 09/27/14  . ED EKG (<69mins upon arrival to the ED)  . ED EKG (<32mins upon arrival to the ED)  . EKG 12-Lead  . EKG 12-Lead  . EKG 12-Lead   . EKG 12-Lead  . EKG      Management plans discussed with the patient, family and they are in agreement.  CODE STATUS:  Advance Directive Documentation        Most Recent Value   Type of Advance Directive  Healthcare Power of Attorney, Living will   Pre-existing out of facility DNR order (yellow form or pink MOST form)     "MOST" Form in Place?  TOTAL TIME TAKING CARE OF THIS PATIENT: 40 minutes.    Myrtis Ser M.D on 10/05/2014 at 9:07 PM  Between 7am to 6pm - Pager - [phone]  After 6pm go to [email or web address] - password [email or web address] [facility]  Office  [phone]  CC: Primary care physician; Rubbie Battiest, NP

## 2014-10-06 ENCOUNTER — Telehealth: Payer: Self-pay | Admitting: *Deleted

## 2014-10-06 NOTE — Telephone Encounter (Signed)
Pt notified and verbalized understanding.

## 2014-10-06 NOTE — Telephone Encounter (Signed)
-----   Message from Haydee Monica, MD sent at 10/06/2014  1:49 PM EDT ----- Regarding: sugar log reviewed I have reviewed his sugar log -checking 0.7 times daily on avg 5/24-10/03/14 Am sugars are 127-188 mostly Pm sugars are 147-216 ( 3 reads)  He really needs to check 2 x daily.  Whatever sugars are there - they are actually not bad.  Okay to increase 70/30 to 32 units am and 22 units evening. Take as directed daily.   thanks

## 2014-10-10 ENCOUNTER — Ambulatory Visit: Payer: Medicare Other | Admitting: Nurse Practitioner

## 2014-10-16 DIAGNOSIS — Z09 Encounter for follow-up examination after completed treatment for conditions other than malignant neoplasm: Secondary | ICD-10-CM | POA: Insufficient documentation

## 2014-10-16 NOTE — Assessment & Plan Note (Signed)
Last Script of Oxycodone 10 mg until he sees Dr. Primus Bravo for further evaluation. Discussed this is not long term and dangerous with alcohol. Pt has been to pain clinics in the past without success. Will not write for narcotics long term and pt verbalized understanding.

## 2014-10-16 NOTE — Assessment & Plan Note (Signed)
Improvement and thorough check of cadiac system. He was cleared and followed up today with less complaints.

## 2014-10-16 NOTE — Assessment & Plan Note (Signed)
TCM visit completed. I personally reviewed the records from 09/29/14 discharge from [facility]. My note summarizes these records. He is stable and a referral to the pain clinic was placed. I wrote pt a note for work to limit heavy lifting for a few months.

## 2014-10-17 ENCOUNTER — Encounter: Payer: Medicare Other | Admitting: Physician Assistant

## 2014-10-17 ENCOUNTER — Encounter: Payer: Self-pay | Admitting: *Deleted

## 2014-10-19 ENCOUNTER — Ambulatory Visit: Payer: Medicare Other | Admitting: Endocrinology

## 2014-10-25 ENCOUNTER — Telehealth: Payer: Self-pay

## 2014-10-25 ENCOUNTER — Other Ambulatory Visit: Payer: Self-pay | Admitting: Endocrinology

## 2014-10-25 DIAGNOSIS — E1165 Type 2 diabetes mellitus with hyperglycemia: Secondary | ICD-10-CM

## 2014-10-25 DIAGNOSIS — IMO0002 Reserved for concepts with insufficient information to code with codable children: Secondary | ICD-10-CM

## 2014-10-25 MED FILL — Insulin Aspart Prot & Aspart (Human) Inj 100 Unit/ML (70-30): SUBCUTANEOUS | Qty: 0.3 | Status: AC

## 2014-10-25 NOTE — Telephone Encounter (Signed)
The patient is hoping to have a referral placed for an endocrinologist at [facility] clinic

## 2014-10-25 NOTE — Telephone Encounter (Signed)
Referral placed for [facility] endocrine

## 2014-10-26 ENCOUNTER — Ambulatory Visit: Payer: Medicare Other | Admitting: Endocrinology

## 2014-10-26 IMAGING — US ABDOMEN ULTRASOUND
1 series · 14 of 25 positions shown · non-contrast
Comparison: None.

CLINICAL DATA: Elevated liver enzymes

EXAM:
ULTRASOUND ABDOMEN COMPLETE

[Series 1: abdomen ultrasound · 0.31mm/px · 14 of 82 slices shown]
[im 1/82]
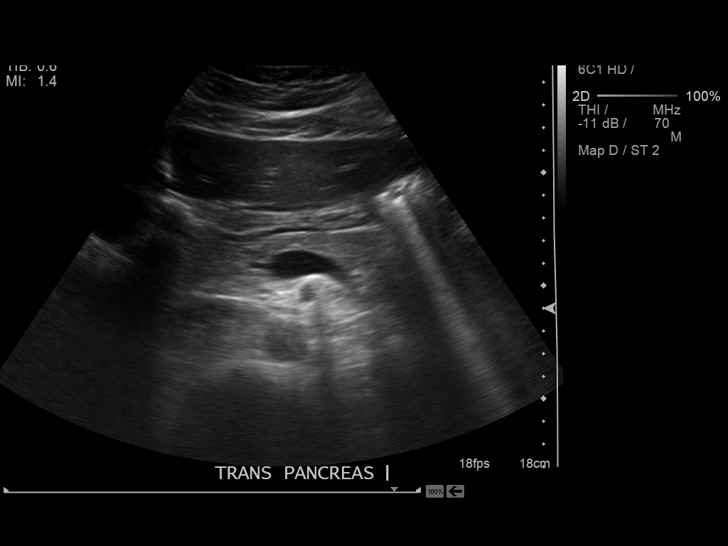
[im 7/82]
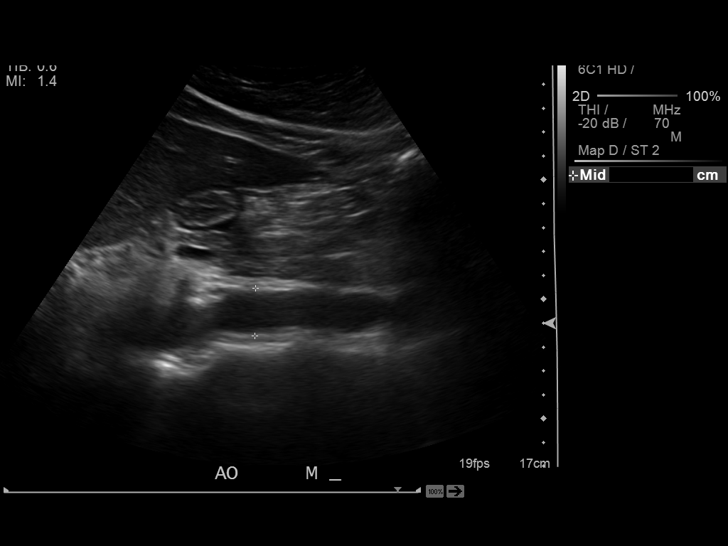
[im 14/82]
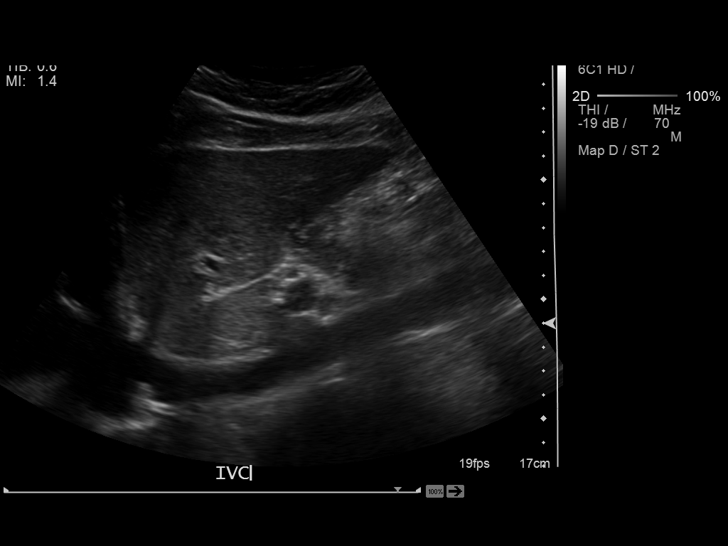
[im 21/82]
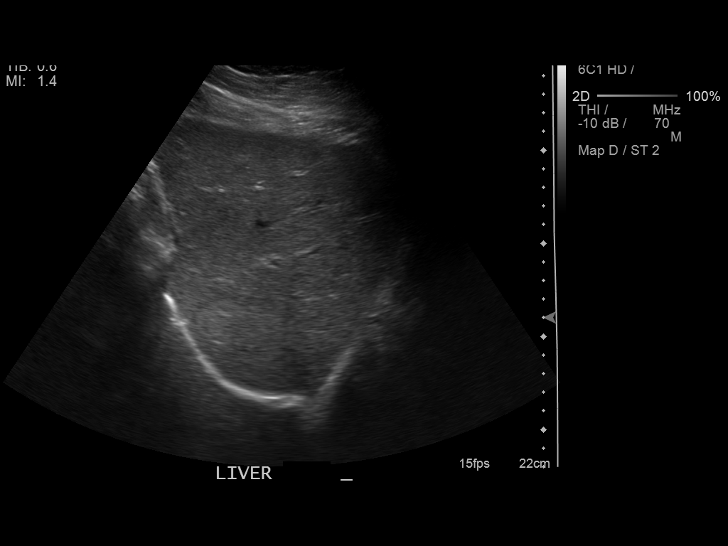
[im 28/82]
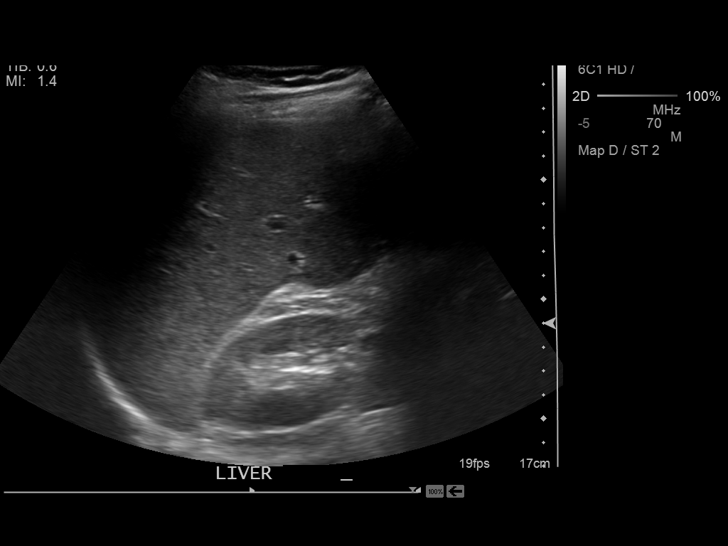
[im 31/82]
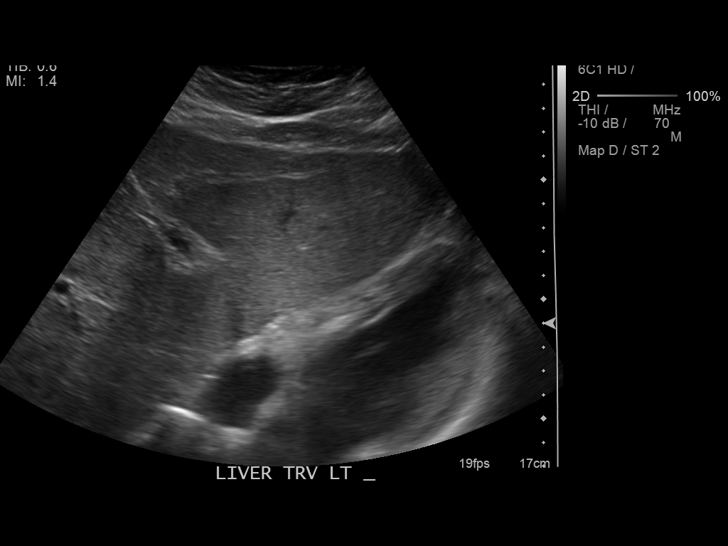
[im 38/82]
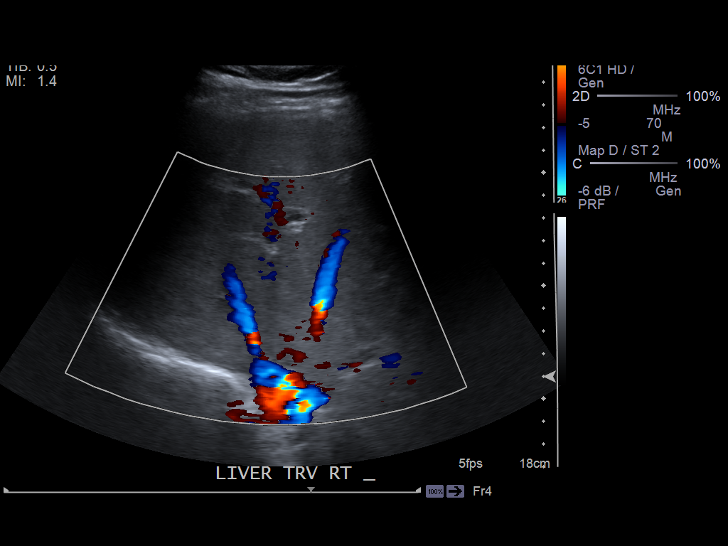
[im 44/82]
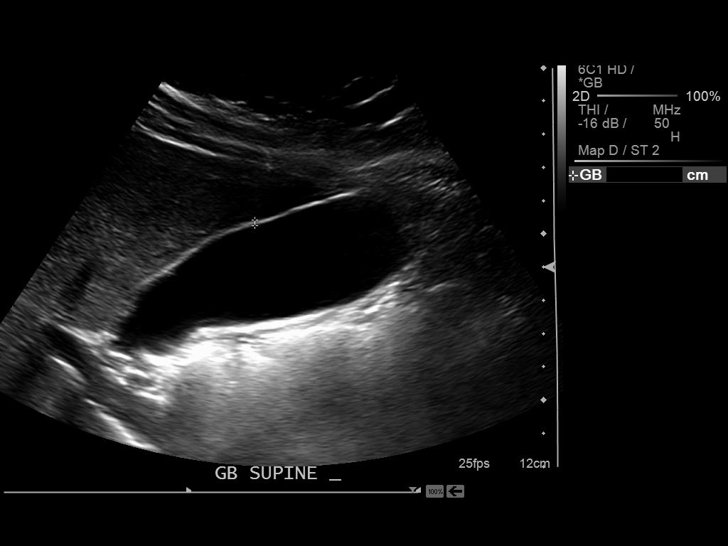
[im 51/82]
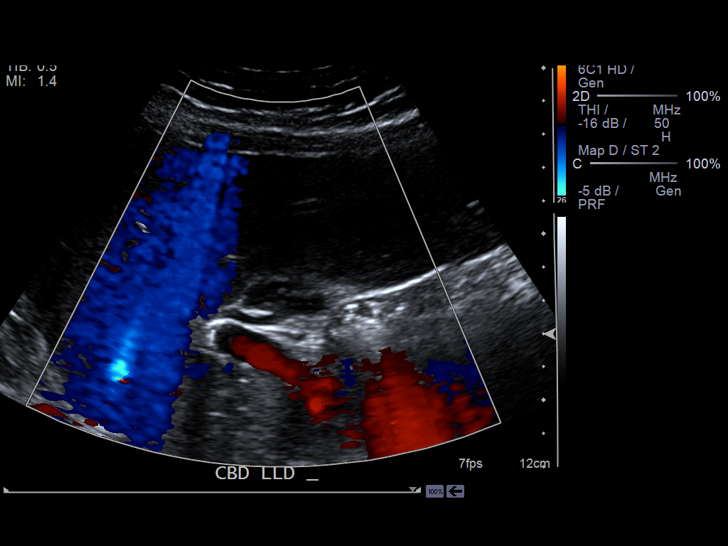
[im 55/82]
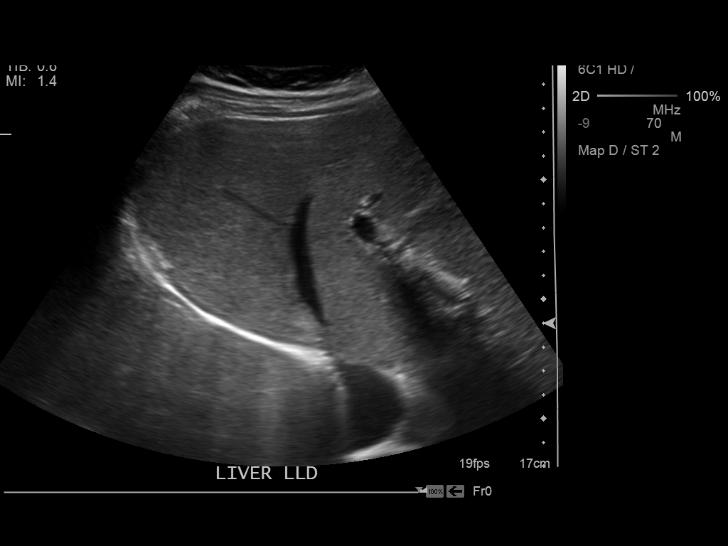
[im 61/82]
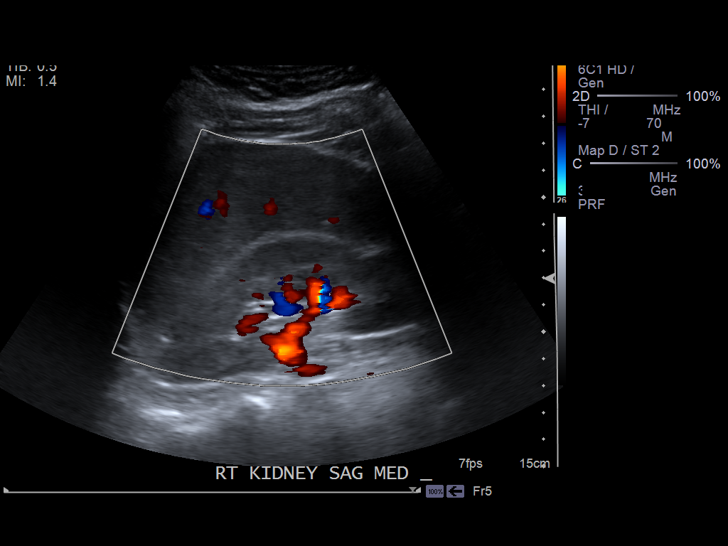
[im 68/82]
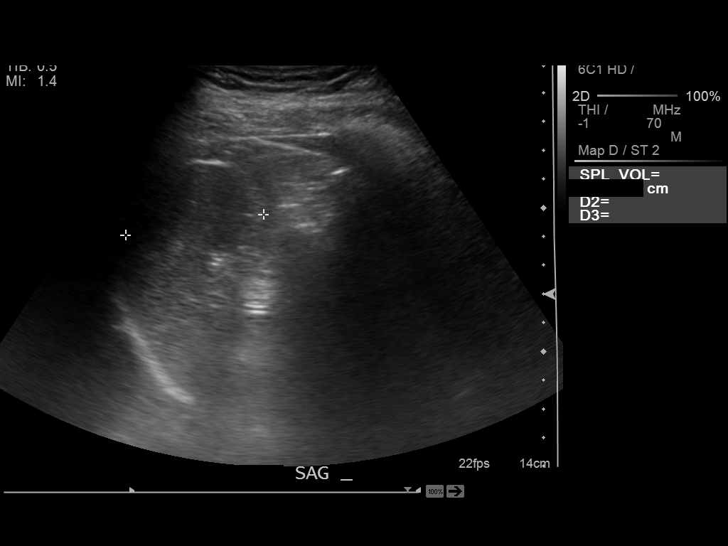
[im 75/82]
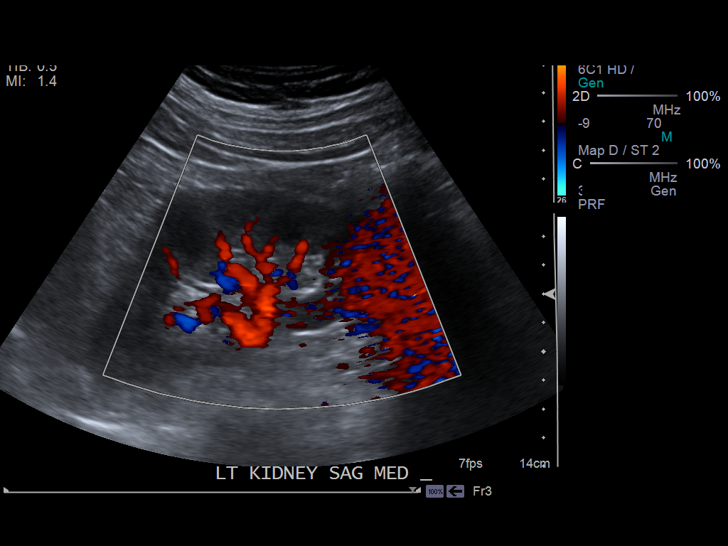
[im 82/82]
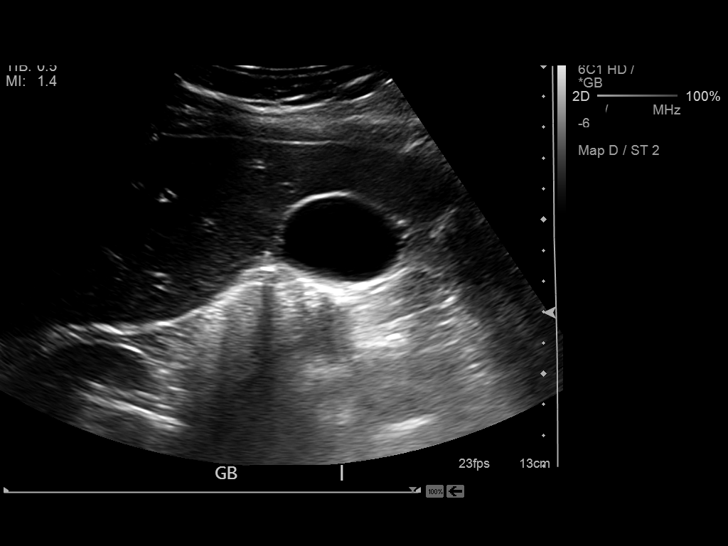

[14 of 25 positions shown; findings below may reference images not displayed]

FINDINGS: Gallbladder:

No gallstones or wall thickening visualized. No sonographic Murphy
sign noted.

Common bile duct:

Diameter: 3.9 mm

Liver:

No focal lesion identified. Within normal limits in parenchymal
echogenicity.

IVC:

No abnormality visualized.

Pancreas:

Visualized portion unremarkable.

Spleen:

Size and appearance within normal limits.

Right Kidney:

Length: 10.9 cm. Echogenicity within normal limits. No mass or
hydronephrosis visualized.

Left Kidney:

Length: 9.9 cm. Echogenicity within normal limits. No mass or
hydronephrosis visualized.

Abdominal aorta:

No aneurysm visualized.

Other findings:

None.
IMPRESSION: Normal abdominal ultrasound.

## 2014-11-16 ENCOUNTER — Telehealth: Payer: Self-pay

## 2014-11-16 NOTE — Telephone Encounter (Signed)
The patient called and is hoping to speak with [facility].  He did not specifically state what the call is about.

## 2014-11-16 NOTE — Telephone Encounter (Signed)
Called pt and he stated that he could not get into the pain clinic until 7.29.16 and wanted to know if he could get a refill of his oxycodone until then. Please advise.

## 2014-11-17 ENCOUNTER — Other Ambulatory Visit: Payer: Self-pay | Admitting: Nurse Practitioner

## 2014-11-17 MED ORDER — OXYCODONE HCL 5 MG PO TABS: 5.0000 mg | ORAL_TABLET | Freq: Two times a day (BID) | ORAL | Status: DC

## 2014-11-17 NOTE — Telephone Encounter (Signed)
The pt called hoping to get a call back.  He states he has additional questions for [facility].

## 2014-11-17 NOTE — Telephone Encounter (Signed)
Informed pt that Rx would be at the front desk for him to pick up, pt verbalized understanding

## 2014-11-17 NOTE — Telephone Encounter (Signed)
His appointment is on the 26th according to our system. He can come pick up a script for 5 days. This is THE LAST script he can have from our office.

## 2014-11-17 NOTE — Telephone Encounter (Signed)
Duplicate message. 

## 2014-11-22 ENCOUNTER — Encounter: Payer: Self-pay | Admitting: Pain Medicine

## 2014-11-22 ENCOUNTER — Ambulatory Visit: Payer: Medicare Other | Admitting: Pain Medicine

## 2014-11-22 ENCOUNTER — Ambulatory Visit: Payer: Medicare Other | Attending: Pain Medicine | Admitting: Pain Medicine

## 2014-11-22 ENCOUNTER — Telehealth: Payer: Self-pay | Admitting: *Deleted

## 2014-11-22 VITALS — BP 103/74 | HR 79 | Temp 97.8°F | Resp 16 | Ht 68.0 in | Wt 172.0 lb

## 2014-11-22 DIAGNOSIS — E119 Type 2 diabetes mellitus without complications: Secondary | ICD-10-CM | POA: Insufficient documentation

## 2014-11-22 DIAGNOSIS — M5030 Other cervical disc degeneration, unspecified cervical region: Secondary | ICD-10-CM | POA: Insufficient documentation

## 2014-11-22 DIAGNOSIS — M47816 Spondylosis without myelopathy or radiculopathy, lumbar region: Secondary | ICD-10-CM

## 2014-11-22 DIAGNOSIS — R5382 Chronic fatigue, unspecified: Secondary | ICD-10-CM | POA: Insufficient documentation

## 2014-11-22 DIAGNOSIS — M533 Sacrococcygeal disorders, not elsewhere classified: Secondary | ICD-10-CM | POA: Insufficient documentation

## 2014-11-22 DIAGNOSIS — I10 Essential (primary) hypertension: Secondary | ICD-10-CM | POA: Insufficient documentation

## 2014-11-22 DIAGNOSIS — M79604 Pain in right leg: Secondary | ICD-10-CM | POA: Diagnosis present

## 2014-11-22 DIAGNOSIS — M79605 Pain in left leg: Secondary | ICD-10-CM | POA: Diagnosis present

## 2014-11-22 DIAGNOSIS — M5136 Other intervertebral disc degeneration, lumbar region: Secondary | ICD-10-CM | POA: Insufficient documentation

## 2014-11-22 DIAGNOSIS — M5481 Occipital neuralgia: Secondary | ICD-10-CM

## 2014-11-22 DIAGNOSIS — F410 Panic disorder [episodic paroxysmal anxiety] without agoraphobia: Secondary | ICD-10-CM | POA: Diagnosis not present

## 2014-11-22 DIAGNOSIS — K219 Gastro-esophageal reflux disease without esophagitis: Secondary | ICD-10-CM | POA: Insufficient documentation

## 2014-11-22 DIAGNOSIS — M545 Low back pain: Secondary | ICD-10-CM | POA: Diagnosis present

## 2014-11-22 DIAGNOSIS — M47812 Spondylosis without myelopathy or radiculopathy, cervical region: Secondary | ICD-10-CM | POA: Insufficient documentation

## 2014-11-22 NOTE — Progress Notes (Signed)
Subjective:    Patient ID: Kyle [name], male    DOB: 1961/03/19, 54 y.o.   MRN: [id number]  HPI  Patient is 54 year old gentleman who [facility] forrther evaluation and treatment of pain involving the necandper extremity regions as well as the lower back and lower extremity regions. Patient states that he has had pain of significant period of time. Patient states that he is without prior surgical intervention of the neck or the lower back region. Patient has undergone prior treatments at the [facility] in [place] as well as with [facility] in [place]. Patient described his pain as aching agonizing undergoing deep disabling distressing dreadful dull exhausting. Patient stated the pain is getting along the pain was described as hot nagging pressure-like pulsating sharp stabbing superficial throbbing tingling tiring toothache-like and uncomfortable. Patient stated the pain awakens him from sleep and interfered. Ability to go to sleep. Patient stated the pain associated with weakness tingling swelling spasms numbness in ability to concentrate erectile dysfunction fatigue. Patient states the pain increases with twisting walking working motion sitting standing squatting stooping lifting kneeling intercourse climbing bending. Patient states the pain decreased with resting warm showers baths and medications. We discussed patient's condition on today's visit and informed patient that we and additional studies including MRIs of the cervical and lumbar regions. We will have patient return to discuss the results of the MRIs and may consider patient for further treatment of refer patient to a note the facility for further evaluation and treatment. The patient was understanding and in agreement status treatment plan.     Review of Systems   Cardiovascular  Heart trouble  Daily aspirin intake high blood pressure   chest pain Heart attack Heart  surgery Blood thinners  Pulmonary Asthma  Emphysema Shortness of breath (without recent change in shortness of breath ) Bronchitis  Neurological Unremarkable  Psychological Anxiety Depression Panic attacks  Gastrointestinal  Gastroesophageal reflux disease Hepatitis  Genitourinary Unremarkable  Hematological Unremarkable  Endocrine Diabetes mellitus  Rheumatological Chronic fatigue syndrome  Musculoskeletal Unremarkable  Other significant Unremarkable      Objective:   Physical Exam   Please note that patient moves extremely slowly and extremely cautiously during the entire examination and was very slow to follow commands  There was tenderness of the splenius capitis and occipitalis musculature region of moderate degree. Patient was with no range of motion of the cervical spine. Patient was without rotation and lateral bending flexion or extension of the cervical spine. There appeared to be no radiation of pain toward the extremities. Patient appeared to be with bilaterally equal grip strength.Palpation of cervical  facet  and cervical paraspinal musculature region as well as the thoracic facet thoracic paraspinal musculature region was a tends to palpation of mild to moderate degree. Palpation over the acromioclavicular glenohumeral joint regions were without increase of pain of any significant degree. There was tenderness to palpation over the lumbar paraspinal muscles region lumbar facet region of mild to moderate degree. Lateral bending and rotation and extension to palpation of the lumbar facets reproduce moderate discomfort. Straight leg raising was tolerated to approximately 20 without a definite increased pain with dorsiflexion noted. There was negative clonus negative Homans. There was mild tenderness of the greater trochanteric region iliotibial band region. She'll the PSIS and PII S region reproduced mild discomfort. there was negative clonus negative  Homans. Abdomen was nontender with no costovertebral angle tenderness noted.      Assessment &  Plan:  Degenerative disc disease cervical spine  Cervical facet syndrome  Degenerative disc disease lumbar spine  Lumbar facet syndrome  Sacroiliac joint dysfunction    Plan   Continue present medications  F/U PCP Lorane Gell  for evaliation of  BP and general medical  condition.  F/U surgical evaluation. We will proceed with surgical evaluation once we obtain   F/U of cervical and lumbar MRI results  Psych evaluation to be considered  F/U neurological evaluation. We will consider patient for further neurological evaluation including PNCV/EMG studies pending results of cervical and lumbar MRIs and follow-up evaluation in [facility]   May consider radiofrequency rhizolysis or intraspinal procedures pending response to present treatment and F/U evaluation.  Patient to call [facility] should patient have concerns prior to scheduled return appointment.

## 2014-11-22 NOTE — Telephone Encounter (Signed)
Pt called states he was seen at Pain Management today.  States 2 MRIs have been ordered (in system).  Pt is requesting a refill on pain medication.  He states he has a follow up appoint in 2 weeks (appoint not in system).  Please advise

## 2014-11-22 NOTE — Patient Instructions (Addendum)
Continue present medications  F/U PCP Lorane Gell for evaliation of  BP and general medical  condition.  F/U surgical evaluation  F/U neurological evaluation  Cervical MRI and lumbar MRI. Please ask Caryl Pina the date of your cervical MRI and lumbar MRI We will obtain cervical MRI to evaluate causes of pain of your neck and upper extremities as discussed and we will obtain lumbar MRI to evaluate your lower back and lower extremity pain  May consider radiofrequency rhizolysis or intraspinal procedures pending response to present treatment and F/U evaluation.  Patient to call [facility] should patient have concerns prior to scheduled return appointment.

## 2014-11-22 NOTE — Telephone Encounter (Signed)
Spoke with pt, advised of Carries message.  Pt verbalized understanding

## 2014-11-22 NOTE — Telephone Encounter (Signed)
Patient can not have a refill on pain medications from our office now that he has been seen by Pain management.

## 2014-11-22 NOTE — Progress Notes (Signed)
Safety precautions to be maintained throughout the outpatient stay will include: orient to surroundings, keep bed in low position, maintain call bell within reach at all times, provide assistance with transfer out of bed and ambulation.  

## 2014-11-23 ENCOUNTER — Telehealth: Payer: Self-pay | Admitting: Pain Medicine

## 2014-11-23 ENCOUNTER — Telehealth: Payer: Self-pay | Admitting: Nurse Practitioner

## 2014-11-23 NOTE — Telephone Encounter (Signed)
Talked with patient.  Advised would research his questions about refills and we will call him back bay 5 p.m. Today.

## 2014-11-23 NOTE — Telephone Encounter (Signed)
Dena and Nurses, I have ordered cervical and lumbar MRIs and has scheduled patient for evaluation for return appointment At this point no decision has been made regarding continuing treatment of patient I agree with you that we should remind patient of policy and also remind patient that I have not decided if I plan to prescribed medications for him

## 2014-11-23 NOTE — Telephone Encounter (Signed)
Pt called again today states Pain Management says his PCP is to fill pain medications until his re evaluation with pain management.  Pt demands to speak with someone.  I referred pt to Willia Craze as Morey Hummingbird is seeing pts.

## 2014-11-23 NOTE — Telephone Encounter (Signed)
Saw dr Primus Bravo yesterday as a new patient. He said he forgot to ask Dr Primus Bravo about getting a refill on his meds. I informed him that Dr Primus Bravo does not take over meds right away. Pt stats that his PCP will not give him a refill because we have seen pt. Pt would like to talk to someone.

## 2014-11-23 NOTE — Telephone Encounter (Signed)
Patient called and informed that he needs to have the MRI's done and patient needs re-evaluated before Dr Primus Bravo will consider taking him as a patient. Patient was told to call his PCP ([facility])  Again and that it could be a couple of months before this Office could write presciptions.

## 2014-11-23 NOTE — Telephone Encounter (Signed)
Talked with patient.  Advised Kyle [name] has requested consultation phone call with Dr. Primus Bravo.  She may not get callback by today at 5 p.m.  Patient will be called tomorrow with next steps.  Patient confirmed understanding.

## 2014-11-24 NOTE — Telephone Encounter (Signed)
Please let Mr. Sherren Mocha know to make an appointment because Dr. Primus Bravo will not be writing for the opioids and I will need to see him to discuss other regimens besides opioids. Thanks!

## 2014-11-24 NOTE — Telephone Encounter (Signed)
Pt scheduled  

## 2014-11-29 ENCOUNTER — Ambulatory Visit: Payer: Medicare Other | Admitting: Nurse Practitioner

## 2014-12-01 ENCOUNTER — Other Ambulatory Visit: Payer: Self-pay | Admitting: Pain Medicine

## 2014-12-14 ENCOUNTER — Ambulatory Visit
Admission: RE | Admit: 2014-12-14 | Discharge: 2014-12-14 | Disposition: A | Discharge status: Home / Self Care | Payer: Medicare HMO | Source: Ambulatory Visit | Attending: Pain Medicine | Admitting: Pain Medicine

## 2014-12-14 DIAGNOSIS — M5481 Occipital neuralgia: Secondary | ICD-10-CM

## 2014-12-14 DIAGNOSIS — M5030 Other cervical disc degeneration, unspecified cervical region: Secondary | ICD-10-CM

## 2014-12-14 DIAGNOSIS — M4802 Spinal stenosis, cervical region: Secondary | ICD-10-CM | POA: Diagnosis not present

## 2014-12-14 DIAGNOSIS — M5126 Other intervertebral disc displacement, lumbar region: Secondary | ICD-10-CM | POA: Insufficient documentation

## 2014-12-14 DIAGNOSIS — M5136 Other intervertebral disc degeneration, lumbar region: Secondary | ICD-10-CM

## 2014-12-14 DIAGNOSIS — M47812 Spondylosis without myelopathy or radiculopathy, cervical region: Secondary | ICD-10-CM

## 2014-12-14 DIAGNOSIS — M47816 Spondylosis without myelopathy or radiculopathy, lumbar region: Secondary | ICD-10-CM

## 2014-12-15 NOTE — Progress Notes (Signed)
Eval appt made to discuss mri results

## 2015-01-10 ENCOUNTER — Encounter: Payer: Self-pay | Admitting: Pain Medicine

## 2015-01-10 ENCOUNTER — Ambulatory Visit: Payer: Medicare HMO | Attending: Pain Medicine | Admitting: Pain Medicine

## 2015-01-10 VITALS — BP 129/89 | HR 91 | Temp 98.0°F | Resp 16 | Ht 67.0 in | Wt 173.0 lb

## 2015-01-10 DIAGNOSIS — M4802 Spinal stenosis, cervical region: Secondary | ICD-10-CM | POA: Insufficient documentation

## 2015-01-10 DIAGNOSIS — M545 Low back pain: Secondary | ICD-10-CM | POA: Diagnosis present

## 2015-01-10 DIAGNOSIS — M4806 Spinal stenosis, lumbar region: Secondary | ICD-10-CM | POA: Insufficient documentation

## 2015-01-10 DIAGNOSIS — M5136 Other intervertebral disc degeneration, lumbar region: Secondary | ICD-10-CM

## 2015-01-10 DIAGNOSIS — M47812 Spondylosis without myelopathy or radiculopathy, cervical region: Secondary | ICD-10-CM

## 2015-01-10 DIAGNOSIS — M533 Sacrococcygeal disorders, not elsewhere classified: Secondary | ICD-10-CM | POA: Insufficient documentation

## 2015-01-10 DIAGNOSIS — M5412 Radiculopathy, cervical region: Secondary | ICD-10-CM | POA: Insufficient documentation

## 2015-01-10 DIAGNOSIS — M5416 Radiculopathy, lumbar region: Secondary | ICD-10-CM

## 2015-01-10 DIAGNOSIS — M5126 Other intervertebral disc displacement, lumbar region: Secondary | ICD-10-CM | POA: Insufficient documentation

## 2015-01-10 DIAGNOSIS — M5030 Other cervical disc degeneration, unspecified cervical region: Secondary | ICD-10-CM

## 2015-01-10 DIAGNOSIS — M542 Cervicalgia: Secondary | ICD-10-CM | POA: Diagnosis present

## 2015-01-10 DIAGNOSIS — D1809 Hemangioma of other sites: Secondary | ICD-10-CM | POA: Insufficient documentation

## 2015-01-10 DIAGNOSIS — M47816 Spondylosis without myelopathy or radiculopathy, lumbar region: Secondary | ICD-10-CM

## 2015-01-10 DIAGNOSIS — M5481 Occipital neuralgia: Secondary | ICD-10-CM

## 2015-01-10 NOTE — Progress Notes (Deleted)
   Subjective:    Patient ID: Kyle [name], male    DOB: 1960-12-03, 54 y.o.   MRN: [id number]  HPI    Review of Systems     Objective:   Physical Exam        Assessment & Plan:

## 2015-01-10 NOTE — Progress Notes (Signed)
Safety precautions to be maintained throughout the outpatient stay will include: orient to surroundings, keep bed in low position, maintain call bell within reach at all times, provide assistance with transfer out of bed and ambulation.  

## 2015-01-10 NOTE — Patient Instructions (Addendum)
PLAN   Continue present medication  Cervical facet, medial branch nerve, blocks to be performed at time return appointment  F/U PCP C Doss for evaliation of  BP and general medical  condition  F/U surgical evaluation.. Surgical evaluation. We will schedule surgical evaluation of pain of the neck and upper extremities as well as the lower back and lower extremities. Please ask Caryl Pina date of your appointment to see surgeon  F/U neurological evaluation. May consider pending follow-up evaluations  May consider radiofrequency rhizolysis or intraspinal procedures pending response to present treatment and F/U evaluation   Patient to call Pain Management Center should patient have concerns prior to scheduled return appointment. Facet Blocks Patient Information  Description: The facets are joints in the spine between the vertebrae.  Like any joints in the body, facets can become irritated and painful.  Arthritis can also effect the facets.  By injecting steroids and local anesthetic in and around these joints, we can temporarily block the nerve supply to them.  Steroids act directly on irritated nerves and tissues to reduce selling and inflammation which often leads to decreased pain.  Facet blocks may be done anywhere along the spine from the neck to the low back depending upon the location of your pain.   After numbing the skin with local anesthetic (like Novocaine), a small needle is passed onto the facet joints under x-ray guidance.  You may experience a sensation of pressure while this is being done.  The entire block usually lasts about 15-25 minutes.   Conditions which may be treated by facet blocks:   Low back/buttock pain  Neck/shoulder pain  Certain types of headaches  Preparation for the injection:  1. Do not eat any solid food or dairy products within 6 hours of your appointment. 2. You may drink clear liquid up to 2 hours before appointment.  Clear liquids include water, black  coffee, juice or soda.  No milk or cream please. 3. You may take your regular medication, including pain medications, with a sip of water before your appointment.  Diabetics should hold regular insulin (if taken separately) and take 1/2 normal NPH dose the morning of the procedure.  Carry some sugar containing items with you to your appointment. 4. A driver must accompany you and be prepared to drive you home after your procedure. 5. Bring all your current medications with you. 6. An IV may be inserted and sedation may be given at the discretion of the physician. 7. A blood pressure cuff, EKG and other monitors will often be applied during the procedure.  Some patients may need to have extra oxygen administered for a short period. 8. You will be asked to provide medical information, including your allergies and medications, prior to the procedure.  We must know immediately if you are taking blood thinners (like Coumadin/Warfarin) or if you are allergic to IV iodine contrast (dye).  We must know if you could possible be pregnant.  Possible side-effects:   Bleeding from needle site  Infection (rare, may require surgery)  Nerve injury (rare)  Numbness & tingling (temporary)  Difficulty urinating (rare, temporary)  Spinal headache (a headache worse with upright posture)  Light-headedness (temporary)  Pain at injection site (serveral days)  Decreased blood pressure (rare, temporary)  Weakness in arm/leg (temporary)  Pressure sensation in back/neck (temporary)   Call if you experience:   Fever/chills associated with headache or increased back/neck pain  Headache worsened by an upright position  New onset, weakness or numbness  of an extremity below the injection site  Hives or difficulty breathing (go to the emergency room)  Inflammation or drainage at the injection site(s)  Severe back/neck pain greater than usual  New symptoms which are concerning to you  Please  note:  Although the local anesthetic injected can often make your back or neck feel good for several hours after the injection, the pain will likely return. It takes 3-7 days for steroids to work.  You may not notice any pain relief for at least one week.  If effective, we will often do a series of 2-3 injections spaced 3-6 weeks apart to maximally decrease your pain.  After the initial series, you may be a candidate for a more permanent nerve block of the facets.  If you have any questions, please call #[phone]) [facility]

## 2015-01-10 NOTE — Progress Notes (Signed)
Subjective:    Patient ID: Kyle [name], male    DOB: February 13, 1961, 55 y.o.   MRN: [id number]  HPI  Patient is 54 year old gentleman who returns to pain management for further evaluation and treatment of pain involving the neck upper extremity regions as well as the lower back and lower extremity regions. On today's visit review patient's MRI of the cervical region and MRI of the lumbar regions. Patient stated predominant pain involving the neck and upper extremity regions. Patient missed her lower back and lower extremity pain of lesser degree. After review of patient's MRIs decision was made to proceed with cervical facet, medial branch nerve, blocks at time return appointment in attempt to decrease severity of patient's symptoms, minimize progression of symptoms, and avoid need for involved treatment. We will also schedule patient for neurosurgical evaluation for further assessment of patient's condition. The patient was understanding and agreement with suggested treatment plan.  Review of Systems     Objective:   Physical Exam  There was tenderness of the splenius capitis and occipitalis musculature region of mild to moderate degree. Palpation over the cervical facet cervical paraspinal musculature region reproduces moderate discomfort. There were no new masses of the head and neck were noted. There was tends to palpation of the cervical facet cervical paraspinal musculature region of moderate to moderately severe degree. There was limited range of motion of the cervical spine. There appeared to be unremarkable Spurling's maneuver. Patient was with decreased grip strength. Tinel and Phalen's maneuver were without increased pain of significant degree. Palpation over the cervical facet cervical paraspinal musculature region reproduced moderately severe discomfort. No definite sensory deficit of dermatomal distribution of the upper extremities were detected. Palpation over the thoracic facet  thoracic paraspinal musculature region associated with moderate discomfort. No crepitus of the thoracic region was noted. Palpation over the region of the lumbar paraspinal musculature region lumbar facet region associated with moderate degree severe discomfort. Lateral bending and rotation associated with moderate discomfort. Extension and palpation of the lumbar facets reproduce moderate discomfort. Straight leg raising was tolerates approximately 30 without increase of pain with dorsiflexion noted. There appeared to be negative clonus negative Homans. Palpation of the PSIS and PII S region reproduced moderate discomfort. Mild tenderness of the greater trochanteric region iliotibial band region. EHL strength decreased on definite sensory deficit of dermatomal distribution detected. There was negative clonus negative Homans. Abdomen nontender with no costovertebral tingling tenderness noted.      Assessment & Plan:   1. 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 Is also mild spinal stenosis at C4-5. 2. Bulging discs and mild facet disease in the lumbar spine but no significant disc protrusions, spinal or foraminal stenosis  Cervical facet syndrome  Cervical radiculopathy   MRI of The lumbar Spine.  Normal alignment of the lumbar vertebral bodies. They demonstrate normal marrow signal except for a few small scattered hemangiomas. The last full intervertebral disc space is labeled L5-S1 and the conus medullaris terminates at T12-L1. The facets are normally aligned. No definite pars defects. No significant paraspinal or retroperitoneal findings.  Mild bulging discs and mild facet disease but no focal disc protrusions, significant spinal or foraminal stenosis.  Lumbar facet syndrome  Sacroiliac joint dysfunction    PLAN   Continue present medication  Cervical facet, medial branch nerve, blocks to be performed at time return appointment Dr   F/U PCP Dr. Haskell Riling  for evaliation of  BP and general medical  condition  F/U surgical evaluation.. Patient scheduled for neurosurgical evaluation of the cervical and upper extremity regions and lumbar and lower extremity regions   F/U neurological evaluation. May consider pending follow-up evaluations  May consider radiofrequency rhizolysis or intraspinal procedures pending response to present treatment and F/U evaluation   Patient to call Pain Management Center should patient have concerns prior to scheduled return appointment.

## 2015-01-11 ENCOUNTER — Telehealth: Payer: Self-pay | Admitting: *Deleted

## 2015-01-11 NOTE — Telephone Encounter (Signed)
[place] has requested if the office rec

## 2015-01-23 ENCOUNTER — Encounter: Payer: Self-pay | Admitting: Pain Medicine

## 2015-01-23 ENCOUNTER — Ambulatory Visit: Payer: Medicare HMO | Attending: Pain Medicine | Admitting: Pain Medicine

## 2015-01-23 VITALS — BP 152/96 | HR 73 | Temp 98.5°F | Resp 16 | Ht 67.0 in | Wt 172.0 lb

## 2015-01-23 DIAGNOSIS — M4802 Spinal stenosis, cervical region: Secondary | ICD-10-CM | POA: Diagnosis not present

## 2015-01-23 DIAGNOSIS — M5136 Other intervertebral disc degeneration, lumbar region: Secondary | ICD-10-CM

## 2015-01-23 DIAGNOSIS — M5021 Other cervical disc displacement,  high cervical region: Secondary | ICD-10-CM | POA: Insufficient documentation

## 2015-01-23 DIAGNOSIS — M51369 Other intervertebral disc degeneration, lumbar region without mention of lumbar back pain or lower extremity pain: Secondary | ICD-10-CM

## 2015-01-23 DIAGNOSIS — M5481 Occipital neuralgia: Secondary | ICD-10-CM

## 2015-01-23 DIAGNOSIS — M79602 Pain in left arm: Secondary | ICD-10-CM | POA: Diagnosis present

## 2015-01-23 DIAGNOSIS — M5030 Other cervical disc degeneration, unspecified cervical region: Secondary | ICD-10-CM

## 2015-01-23 DIAGNOSIS — M47816 Spondylosis without myelopathy or radiculopathy, lumbar region: Secondary | ICD-10-CM

## 2015-01-23 DIAGNOSIS — M79601 Pain in right arm: Secondary | ICD-10-CM | POA: Diagnosis present

## 2015-01-23 DIAGNOSIS — M47812 Spondylosis without myelopathy or radiculopathy, cervical region: Secondary | ICD-10-CM

## 2015-01-23 DIAGNOSIS — M5416 Radiculopathy, lumbar region: Secondary | ICD-10-CM

## 2015-01-23 DIAGNOSIS — M542 Cervicalgia: Secondary | ICD-10-CM | POA: Diagnosis present

## 2015-01-23 MED ORDER — FENTANYL CITRATE (PF) 100 MCG/2ML IJ SOLN: 100.0000 ug | INTRAMUSCULAR | Status: DC

## 2015-01-23 MED ORDER — ORPHENADRINE CITRATE 30 MG/ML IJ SOLN
INTRAMUSCULAR | Status: AC
  Administered 2015-01-23: 10:00:00
  Filled 2015-01-23: qty 2

## 2015-01-23 MED ORDER — TRIAMCINOLONE ACETONIDE 40 MG/ML IJ SUSP
INTRAMUSCULAR | Status: AC
  Administered 2015-01-23: 10:00:00
  Filled 2015-01-23: qty 1

## 2015-01-23 MED ORDER — SODIUM CHLORIDE 0.9 % IJ SOLN
INTRAMUSCULAR | Status: AC
  Administered 2015-01-23: 10:00:00
  Filled 2015-01-23: qty 20

## 2015-01-23 MED ORDER — BUPIVACAINE HCL (PF) 0.25 % IJ SOLN
INTRAMUSCULAR | Status: AC
  Administered 2015-01-23: 10:00:00
  Filled 2015-01-23: qty 30

## 2015-01-23 MED ORDER — CIPROFLOXACIN HCL 250 MG PO TABS: 250.0000 mg | ORAL_TABLET | Freq: Two times a day (BID) | ORAL | Status: DC

## 2015-01-23 MED ORDER — MIDAZOLAM HCL 5 MG/5ML IJ SOLN
INTRAMUSCULAR | Status: AC
  Administered 2015-01-23: 3 mg via INTRAVENOUS
  Filled 2015-01-23: qty 5

## 2015-01-23 MED ORDER — BUPIVACAINE HCL (PF) 0.25 % IJ SOLN: 30.0000 mL | Freq: Once | INTRAMUSCULAR | Status: DC

## 2015-01-23 MED ORDER — CIPROFLOXACIN IN D5W 400 MG/200ML IV SOLN
INTRAVENOUS | Status: AC
  Administered 2015-01-23: 400 mg via INTRAVENOUS
  Filled 2015-01-23: qty 200

## 2015-01-23 MED ORDER — FENTANYL CITRATE (PF) 100 MCG/2ML IJ SOLN
INTRAMUSCULAR | Status: AC
  Administered 2015-01-23: 50 ug via INTRAVENOUS
  Filled 2015-01-23: qty 2

## 2015-01-23 NOTE — Progress Notes (Signed)
Subjective:    Patient ID: Kyle [name], male    DOB: [birth year], 54 y.o.   MRN: [id number]  HPI  PROCEDURE PERFORMED: Cervical facet (medial branch block)   NOTE: The patient is a 54 y.o. male who returns to Pain Management Center for further evaluation and treatment of pain involving the cervical region and upper extremity region with prior studies revealing the patient to be with  MRI evidence of mild foraminal encroachment bilaterally at C3-4, C4-5 and C5-6.  Mild spinal stenosis at C4-5.. bulging discs and mild facet disease in the lumbar spine but nosignificant disc protrusions, spinal or foraminal stenosis. There is concern regarding patient's pain being due to degenerative changes of the cervical spine with a significant component of pain due to cervical facet syndrome The risks, benefits, and expectations of the procedure have been discussed and explained to the patient who was understanding and in agreement with suggested treatment plan. We will proceed with interventional treatment as discussed and explained to the patient who was understanding and willing to proceed with proposed treatment plan.   DESCRIPTION OF PROCEDURE: Cervical facet (medial branch block) with IV Versed, IV fentanyl conscious sedation, EKG, blood pressure, pulse, and pulse oximetry monitoring. The procedure was performed with the patient in the prone position under fluoroscopic guidance with EKG, blood pressure, pulse, and pulse oximetry monitoring.   Left C 3 cervical facet (medial branch block). With the patient in the prone position, AP view of the cervical spine, a 22 -gauge needle was inserted at the C3 vertebral body level with needle placed in the area known as the waist of the vertebral body of C3. Following documentation of needle in the area known as the waist of the vertebral body of C 3, needle placement was then accomplished at the C4, C5 and C6  vertebral body level. After needle placement was  accomplished at each level on AP view of the cervical spine, needle placement was then verified on lateral view with tip of needle documented to be in the center of the articulating pillars of C 3, C 4, C 5,C 6.   Following negative aspiration for heme and CSF at each level, each level was injected with 1 mL of Preservative-Free normal saline with Kenalog.    CERVICAL FACET MEDIAL BRANCH NERVE BLOCKS ON THE RIGHT SIDE The procedure was performed on the right side exactly as was performed on the left side and at the same levels and under fluoroscopic guidance utilizing the same technique  The patient tolerated the procedure well. Needle removed. A total of 40 mg of Kenalog was utilized for the procedure.   PLAN:  1. Medications: The patient will continue presently prescribed medications. 2. Will consider modification of treatment regimen pending response to treatment rendered on today's visit and follow-up evaluation. 3. The patient is to follow-up with primary care physician C Doss for evaluation of blood pressure and general medical condition following procedure performed on today's visit. 4. Surgical evaluation. Neurosurgical evaluation as planned 5. Neurological evaluation. We will consider PNCV EMG studies and other studies 6. May consider radiofrequency procedures, implantation device, and other treatment pending response to treatment and follow-up evaluation. 7. The patient has been advised to adhere to proper body mechanic and avoid activities which appear to aggravate condition. 8. The patient has been advised to call the Pain Management Center prior to scheduled return appointment should there be significant change in condition or should patient have other concerns regarding condition prior to scheduled  return appointment.  The patient is understanding and in agreement with suggested treatment plan.   Review of Systems     Objective:   Physical Exam        Assessment & Plan:

## 2015-01-23 NOTE — Progress Notes (Signed)
Safety precautions to be maintained throughout the outpatient stay will include: orient to surroundings, keep bed in low position, maintain call bell within reach at all times, provide assistance with transfer out of bed and ambulation.  

## 2015-01-23 NOTE — Patient Instructions (Addendum)
PLAN   Continue present medication and please get your Cipro antibiotic and begin taking Cipro antibiotic today as prescribed  F/U PCP C Doss for evaliation of  BP and general medical  condition  F/U surgical evaluation. May consider pending follow-up evaluations  F/U neurological evaluation. May consider pending follow-up evaluations  May consider radiofrequency rhizolysis or intraspinal procedures pending response to present treatment and F/U evaluation   Patient to call Pain Management Center should patient have concerns prior to scheduled return appointment. Pain Management Discharge Instructions  General Discharge Instructions :  If you need to reach your doctor call: Monday-Friday 8:00 am - 4:00 pm at [phone] or toll free [phone].  After clinic hours [phone] to have operator reach doctor.  Bring all of your medication bottles to all your appointments in the pain clinic.  To cancel or reschedule your appointment with Pain Management please remember to call 24 hours in advance to avoid a fee.  Refer to the educational materials which you have been given on: General Risks, I had my Procedure. Discharge Instructions, Post Sedation.  Post Procedure Instructions:  The drugs you were given will stay in your system until tomorrow, so for the next 24 hours you should not drive, make any legal decisions or drink any alcoholic beverages.  You may eat anything you prefer, but it is better to start with liquids then soups and crackers, and gradually work up to solid foods.  Please notify your doctor immediately if you have any unusual bleeding, trouble breathing or pain that is not related to your normal pain.  Depending on the type of procedure that was done, some parts of your body may feel week and/or numb.  This usually clears up by tonight or the next day.  Walk with the use of an assistive device or accompanied by an adult for the 24 hours.  You may use ice on  the affected area for the first 24 hours.  Put ice in a Ziploc bag and cover with a towel and place against area 15 minutes on 15 minutes off.  You may switch to heat after 24 hours.Facet Joint Block The facet joints connect the bones of the spine (vertebrae). They make it possible for you to bend, twist, and make other movements with your spine. They also prevent you from overbending, overtwisting, and making other excessive movements.  A facet joint block is a procedure where a numbing medicine (anesthetic) is injected into a facet joint. Often, a type of anti-inflammatory medicine called a steroid is also injected. A facet joint block may be done for two reasons:   Diagnosis. A facet joint block may be done as a test to see whether neck or back pain is caused by a worn-down or infected facet joint. If the pain gets better after a facet joint block, it means the pain is probably coming from the facet joint. If the pain does not get better, it means the pain is probably not coming from the facet joint.   Therapy. A facet joint block may be done to relieve neck or back pain caused by a facet joint. A facet joint block is only done as a therapy if the pain does not improve with medicine, exercise programs, physical therapy, and other forms of pain management. LET [facility] CARE PROVIDER KNOW ABOUT:   Any allergies you have.   All medicines you are taking, including vitamins, herbs, eyedrops, and over-the-counter medicines and creams.   Previous problems you or  members of your family have had with the use of anesthetics.   Any blood disorders you have had.   Other health problems you have. RISKS AND COMPLICATIONS Generally, having a facet joint block is safe. However, as with any procedure, complications can occur. Possible complications associated with having a facet joint block include:   Bleeding.   Injury to a nerve near the injection site.   Pain at the injection site.    Weakness or numbness in areas controlled by nerves near the injection site.   Infection.   Temporary fluid retention.   Allergic reaction to anesthetics or medicines used during the procedure. BEFORE THE PROCEDURE   Follow your health care provider's instructions if you are taking dietary supplements or medicines. You may need to stop taking them or reduce your dosage.   Do not take any new dietary supplements or medicines without asking your health care provider first.   Follow your health care provider's instructions about eating and drinking before the procedure. You may need to stop eating and drinking several hours before the procedure.   Arrange to have an adult drive you home after the procedure. PROCEDURE  You may need to remove your clothing and dress in an open-back gown so that your health care provider can access your spine.   The procedure will be done while you are lying on an X-ray table. Most of the time you will be asked to lie on your stomach, but you may be asked to lie in a different position if an injection will be made in your neck.   Special machines will be used to monitor your oxygen levels, heart rate, and blood pressure.   If an injection will be made in your neck, an intravenous (IV) tube will be inserted into one of your veins. Fluids and medicine will flow directly into your body through the IV tube.   The area over the facet joint where the injection will be made will be cleaned with an antiseptic soap. The surrounding skin will be covered with sterile drapes.   An anesthetic will be applied to your skin to make the injection area numb. You may feel a temporary stinging or burning sensation.   A video X-ray machine will be used to locate the joint. A contrast dye may be injected into the facet joint area to help with locating the joint.   When the joint is located, an anesthetic medicine will be injected into the joint through the  needle.   Your health care provider will ask you whether you feel pain relief. If you do feel relief, a steroid may be injected to provide pain relief for a longer period of time. If you do not feel relief or feel only partial relief, additional injections of an anesthetic may be made in other facet joints.   The needle will be removed, the skin will be cleansed, and bandages will be applied.  AFTER THE PROCEDURE   You will be observed for 15-30 minutes before being allowed to go home. Do not drive. Have an adult drive you or take a taxi or public transportation instead.   If you feel pain relief, the pain will return in several hours or days when the anesthetic wears off.   You may feel pain relief 2-14 days after the procedure. The amount of time this relief lasts varies from person to person.   It is normal to feel some tenderness over the injected area(s) for 2 days  following the procedure.   If you have diabetes, you may have a temporary increase in blood sugar. Document Released: 09/04/2006 Document Revised: 08/30/2013 Document Reviewed: 02/03/2012 [facility] Patient Information 2015 [place], [place]. This information is not intended to replace advice given to you by your health care provider. Make sure you discuss any questions you have with your health care provider.

## 2015-01-24 ENCOUNTER — Telehealth: Payer: Self-pay | Admitting: *Deleted

## 2015-01-24 NOTE — Telephone Encounter (Signed)
Left voice mail

## 2015-02-01 ENCOUNTER — Ambulatory Visit: Payer: Medicare HMO | Admitting: Nurse Practitioner

## 2015-02-01 DIAGNOSIS — Z0289 Encounter for other administrative examinations: Secondary | ICD-10-CM

## 2015-02-10 ENCOUNTER — Telehealth: Payer: Self-pay | Admitting: Nurse Practitioner

## 2015-02-15 ENCOUNTER — Telehealth: Payer: Self-pay | Admitting: Nurse Practitioner

## 2015-02-15 NOTE — Telephone Encounter (Signed)
Fernando [phone] and fax [phone] called from [facility] regarding checking to see if the diabetic testing supply was received? It was faxed on 02/08/2015. Thank You!

## 2015-02-16 ENCOUNTER — Encounter: Payer: Self-pay | Admitting: Emergency Medicine

## 2015-02-16 ENCOUNTER — Emergency Department
Admission: EM | Admit: 2015-02-16 | Discharge: 2015-02-16 | Disposition: A | Discharge status: Home / Self Care | Payer: Medicare HMO | Attending: Emergency Medicine | Admitting: Emergency Medicine

## 2015-02-16 DIAGNOSIS — I10 Essential (primary) hypertension: Secondary | ICD-10-CM | POA: Diagnosis not present

## 2015-02-16 DIAGNOSIS — Z720 Tobacco use: Secondary | ICD-10-CM | POA: Insufficient documentation

## 2015-02-16 DIAGNOSIS — R739 Hyperglycemia, unspecified: Secondary | ICD-10-CM

## 2015-02-16 DIAGNOSIS — R531 Weakness: Secondary | ICD-10-CM

## 2015-02-16 DIAGNOSIS — E1165 Type 2 diabetes mellitus with hyperglycemia: Secondary | ICD-10-CM | POA: Insufficient documentation

## 2015-02-16 DIAGNOSIS — Z880 Allergy status to penicillin: Secondary | ICD-10-CM | POA: Insufficient documentation

## 2015-02-16 LAB — URINALYSIS COMPLETE WITH MICROSCOPIC (ARMC ONLY)
Bacteria, UA: NONE SEEN
Bilirubin Urine: NEGATIVE
Hgb urine dipstick: NEGATIVE
KETONES UR: NEGATIVE mg/dL
Leukocytes, UA: NEGATIVE
Nitrite: NEGATIVE
Protein, ur: NEGATIVE mg/dL
RBC / HPF: NONE SEEN RBC/hpf (ref 0–5)
SPECIFIC GRAVITY, URINE: 1.029 (ref 1.005–1.030)
pH: 5 (ref 5.0–8.0)

## 2015-02-16 LAB — BASIC METABOLIC PANEL
Anion gap: 6 (ref 5–15)
BUN: 11 mg/dL (ref 6–20)
CALCIUM: 9.3 mg/dL (ref 8.9–10.3)
CO2: 28 mmol/L (ref 22–32)
CREATININE: 0.79 mg/dL (ref 0.61–1.24)
Chloride: 99 mmol/L — ABNORMAL LOW (ref 101–111)
GFR calc Af Amer: >60 mL/min (ref >60)
GFR calc non Af Amer: >60 mL/min (ref >60)
GLUCOSE: 366 mg/dL — AB (ref 65–99)
Potassium: 4.7 mmol/L (ref 3.5–5.1)
Sodium: 133 mmol/L — ABNORMAL LOW (ref 135–145)

## 2015-02-16 LAB — CBC
HCT: 46.5 % (ref 40.0–52.0)
Hemoglobin: 15.2 g/dL (ref 13.0–18.0)
MCH: 28.2 pg (ref 26.0–34.0)
MCHC: 32.7 g/dL (ref 32.0–36.0)
MCV: 86 fL (ref 80.0–100.0)
PLATELETS: 80 10*3/uL — AB (ref 150–440)
RBC: 5.4 MIL/uL (ref 4.40–5.90)
RDW: 13.8 % (ref 11.5–14.5)
WBC: 8 10*3/uL (ref 3.8–10.6)

## 2015-02-16 LAB — GLUCOSE, CAPILLARY: GLUCOSE-CAPILLARY: 328 mg/dL — AB (ref 65–99)

## 2015-02-16 LAB — TROPONIN I

## 2015-02-16 MED ORDER — SODIUM CHLORIDE 0.9 % IV SOLN
Freq: Once | INTRAVENOUS | Status: AC
  Administered 2015-02-16: 13:00:00 via INTRAVENOUS

## 2015-02-16 MED ORDER — OXYCODONE-ACETAMINOPHEN 5-325 MG PO TABS
2.0000 | ORAL_TABLET | Freq: Once | ORAL | Status: AC
  Administered 2015-02-16: 2 via ORAL
  Filled 2015-02-16: qty 2

## 2015-02-16 MED ORDER — SODIUM CHLORIDE 0.9 % IV SOLN
Freq: Once | INTRAVENOUS | Status: AC
  Administered 2015-02-16: 12:00:00 via INTRAVENOUS

## 2015-02-16 NOTE — Discharge Instructions (Signed)
Hyperglycemia °Hyperglycemia occurs when the glucose (sugar) in your blood is too high. Hyperglycemia can happen for many reasons, but it most often happens to people who do not know they have diabetes or are not managing their diabetes properly.  °CAUSES  °Whether you have diabetes or not, there are other causes of hyperglycemia. Hyperglycemia can occur when you have diabetes, but it can also occur in other situations that you might not be as aware of, such as: °Diabetes °· If you have diabetes and are having problems controlling your blood glucose, hyperglycemia could occur because of some of the following reasons: °· Not following your meal plan. °· Not taking your diabetes medications or not taking it properly. °· Exercising less or doing less activity than you normally do. °· Being sick. °Pre-diabetes °· This cannot be ignored. Before people develop Type 2 diabetes, they almost always have "pre-diabetes." This is when your blood glucose levels are higher than normal, but not yet high enough to be diagnosed as diabetes. Research has shown that some long-term damage to the body, especially the heart and circulatory system, may already be occurring during pre-diabetes. If you take action to manage your blood glucose when you have pre-diabetes, you may delay or prevent Type 2 diabetes from developing. °Stress °· If you have diabetes, you may be "diet" controlled or on oral medications or insulin to control your diabetes. However, you may find that your blood glucose is higher than usual in the hospital whether you have diabetes or not. This is often referred to as "stress hyperglycemia." Stress can elevate your blood glucose. This happens because of hormones put out by the body during times of stress. If stress has been the cause of your high blood glucose, it can be followed regularly by your caregiver. That way he/she can make sure your hyperglycemia does not continue to get worse or progress to  diabetes. °Steroids °· Steroids are medications that act on the infection fighting system (immune system) to block inflammation or infection. One side effect can be a rise in blood glucose. Most people can produce enough extra insulin to allow for this rise, but for those who cannot, steroids make blood glucose levels go even higher. It is not unusual for steroid treatments to "uncover" diabetes that is developing. It is not always possible to determine if the hyperglycemia will go away after the steroids are stopped. A special blood test called an A1c is sometimes done to determine if your blood glucose was elevated before the steroids were started. °SYMPTOMS °· Thirsty. °· Frequent urination. °· Dry mouth. °· Blurred vision. °· Tired or fatigue. °· Weakness. °· Sleepy. °· Tingling in feet or leg. °DIAGNOSIS  °Diagnosis is made by monitoring blood glucose in one or all of the following ways: °· A1c test. This is a chemical found in your blood. °· Fingerstick blood glucose monitoring. °· Laboratory results. °TREATMENT  °First, knowing the cause of the hyperglycemia is important before the hyperglycemia can be treated. Treatment may include, but is not be limited to: °· Education. °· Change or adjustment in medications. °· Change or adjustment in meal plan. °· Treatment for an illness, infection, etc. °· More frequent blood glucose monitoring. °· Change in exercise plan. °· Decreasing or stopping steroids. °· Lifestyle changes. °HOME CARE INSTRUCTIONS  °· Test your blood glucose as directed. °· Exercise regularly. Your caregiver will give you instructions about exercise. Pre-diabetes or diabetes which comes on with stress is helped by exercising. °· Eat wholesome,   balanced meals. Eat often and at regular, fixed times. Your caregiver or nutritionist will give you a meal plan to guide your sugar intake. °· Being at an ideal weight is important. If needed, losing as little as 10 to 15 pounds may help improve blood  glucose levels. °SEEK MEDICAL CARE IF:  °· You have questions about medicine, activity, or diet. °· You continue to have symptoms (problems such as increased thirst, urination, or weight gain). °SEEK IMMEDIATE MEDICAL CARE IF:  °· You are vomiting or have diarrhea. °· Your breath smells fruity. °· You are breathing faster or slower. °· You are very sleepy or incoherent. °· You have numbness, tingling, or pain in your feet or hands. °· You have chest pain. °· Your symptoms get worse even though you have been following your caregiver's orders. °· If you have any other questions or concerns. °  °This information is not intended to replace advice given to you by your health care provider. Make sure you discuss any questions you have with your health care provider. °  °Document Released: 10/09/2000 Document Revised: 07/08/2011 Document Reviewed: 12/20/2014 °Elsevier Interactive Patient Education ©2016 Elsevier Inc[employer] °Weakness °Weakness is a lack of strength. It may be felt all over the body (generalized) or in one specific part of the body (focal). Some causes of weakness can be serious. You may need further medical evaluation, especially if you are elderly or you have a history of immunosuppression (such as chemotherapy or HIV), kidney disease, heart disease, or diabetes. °CAUSES  °Weakness can be caused by many different things, including: °· Infection. °· Physical exhaustion. °· Internal bleeding or other blood loss that results in a lack of red blood cells (anemia). °· Dehydration. This cause is more common in elderly people. °· Side effects or electrolyte abnormalities from medicines, such as pain medicines or sedatives. °· Emotional distress, anxiety, or depression. °· Circulation problems, especially severe peripheral arterial disease. °· Heart disease, such as rapid atrial fibrillation, bradycardia, or heart failure. °· Nervous system disorders, such as Guillain-Barré syndrome, multiple sclerosis, or  stroke. °DIAGNOSIS  °To find the cause of your weakness, your caregiver will take your history and perform a physical exam. Lab tests or X-rays may also be ordered, if needed. °TREATMENT  °Treatment of weakness depends on the cause of your symptoms and can vary greatly. °HOME CARE INSTRUCTIONS  °· Rest as needed. °· Eat a well-balanced diet. °· Try to get some exercise every day. °· Only take over-the-counter or prescription medicines as directed by your caregiver. °SEEK MEDICAL CARE IF:  °· Your weakness seems to be getting worse or spreads to other parts of your body. °· You develop new aches or pains. °SEEK IMMEDIATE MEDICAL CARE IF:  °· You cannot perform your normal daily activities, such as getting dressed and feeding yourself. °· You cannot walk up and down stairs, or you feel exhausted when you do so. °· You have shortness of breath or chest pain. °· You have difficulty moving parts of your body. °· You have weakness in only one area of the body or on only one side of the body. °· You have a fever. °· You have trouble speaking or swallowing. °· You cannot control your bladder or bowel movements. °· You have black or bloody vomit or stools. °MAKE SURE YOU: °· Understand these instructions. °· Will watch your condition. °· Will get help right away if you are not doing well or get worse. °  °This information is not   intended to replace advice given to you by your health care provider. Make sure you discuss any questions you have with your health care provider. °  °Document Released: 04/15/2005 Document Revised: 10/15/2011 Document Reviewed: 06/14/2011 °Elsevier Interactive Patient Education ©2016 Elsevier Inc. ° °

## 2015-02-16 NOTE — ED Notes (Signed)
Pt informed to return if any life threatening symptoms occur.  

## 2015-02-16 NOTE — ED Notes (Signed)
For past few days he feels bad.  Dizziness and weakness. ? Fever.  Says he thought maybe his sugar was off, but it has been up and down at home.

## 2015-02-16 NOTE — ED Provider Notes (Signed)
[facility] Emergency Department Provider Note     Time seen: ----------------------------------------- 11:45 AM on 02/16/2015 -----------------------------------------    I have reviewed the triage vital signs and the nursing notes.   HISTORY  Chief Complaint Weakness and Dizziness    HPI Kyle [name] is a 54 y.o. male who presents ER for weakness and dizziness for the last few days. Patient thinks he may be dehydrated, concern he may have a fever as well. He has noted recently his blood sugars been elevated into the 400s, it's been up and down at home. Recently has increased his Lantus to 35 units without any improvement in his hypoglycemia. He denies any other complaints.   Past Medical History  Diagnosis Date  . Asthma   . DM2 (diabetes mellitus, type 2) ([place])   . COPD (chronic obstructive pulmonary disease) ([place])   . Fainting spell   . GERD (gastroesophageal reflux disease)   . Allergy   . Hyperlipidemia with target LDL less than 70   . Hypertension   . Urine incontinence   . Coronary artery disease involving native coronary artery     a. 4 vessel CABG 10/2012 at [place]; b. [facility] 08/2014: small defect of mild severity present along apical lateral & apex location, no st segment changes,  LV function 45-54%, poor gated images lead to poor EF & wall motion assessment, low risk study  . S/P CABG x 4     a. LIMA-LAD, SVG-Ramus, SVG-OM,SVG-dRCA  . Chronic back pain 1981  . DDD (degenerative disc disease)   . Hepatitis C     2/2 tattoo  . Polysubstance abuse     a. ongoing daily tobacco abuse and etoh (on the weekends), remote crack/cocaine abuse last used approximately in 2006    Patient Active Problem List   Diagnosis Date Noted  . DDD (degenerative disc disease), lumbar 01/10/2015  . Facet syndrome, lumbar 01/10/2015  . Lumbar radiculopathy 01/10/2015  . DDD (degenerative disc disease), cervical 11/22/2014  .  Cervical facet syndrome 11/22/2014  . Bilateral occipital neuralgia 11/22/2014  . Hospital discharge follow-up 10/16/2014  . Coronary artery disease involving native coronary artery   . S/P CABG x 4   . Chest pain with moderate risk for cardiac etiology 09/27/2014  . Cervicalgia 07/05/2014  . Acute bacterial rhinosinusitis 06/15/2014  . Non compliance w medication regimen 06/07/2014  . Chest tightness 06/07/2014  . Tobacco abuse counseling 01/05/2014  . Vitamin D deficiency 01/05/2014  . Transaminitis 01/05/2014  . Temporary low platelet count ([place]) 01/05/2014  . Diabetes mellitus type 2, uncontrolled ([place]) 01/05/2014  . Back pain 10/01/2013  . Need for prophylactic vaccination with tetanus-diphtheria (TD) 10/01/2013  . Penile lesion 10/01/2013  . Essential hypertension 08/29/2013  . Hyperlipidemia with target LDL less than 70 08/29/2013  . Callus of foot 08/29/2013  . Blurred vision 08/29/2013  . Itching 08/29/2013  . Exacerbation of chronic back pain 06/07/2013  . Hyperlipidemia 03/02/2013  . Former smoker 03/02/2013  . Routine general medical examination at a health care facility 02/24/2013  . Hepatitis C 02/24/2013  . Diabetes type 2, uncontrolled ([place]) 02/24/2013    Past Surgical History  Procedure Laterality Date  . Coronary artery bypass graft  8/14    CABG x 4; LIMA-LAD, SVG-RI, SVG-OM1, SVG-rPDA (Atretic RIMA).  . Cardiac catheterization  11/2012    [facility] - Multivessel CAD  . Neck injection      Allergies Amoxicillin; Flexeril; Methadone; Tramadol;  and Trazodone and nefazodone  Social History Social History  Substance Use Topics  . Smoking status: Current Every Day Smoker -- 35 years    Types: Cigarettes    Last Attempt to Quit: 11/24/2012  . Smokeless tobacco: Never Used  . Alcohol Use: Yes     Comment: occasional    Review of Systems Constitutional: Negative for fever. Eyes: Negative for visual changes. ENT: Negative for sore  throat. Cardiovascular: Negative for chest pain. Respiratory: Negative for shortness of breath. Gastrointestinal: Negative for abdominal pain, vomiting and diarrhea. Genitourinary: Negative for dysuria. Musculoskeletal: Negative for back pain. Skin: Negative for rash. Neurological: Negative for headaches, positive for weakness  10-point ROS otherwise negative.  ____________________________________________   PHYSICAL EXAM:  VITAL SIGNS: ED Triage Vitals  Enc Vitals Group     BP 02/16/15 1034 143/93 mmHg     Pulse Rate 02/16/15 1034 92     Resp 02/16/15 1034 16     Temp 02/16/15 1034 98 F (36.7 C)     Temp src --      SpO2 02/16/15 1034 98 %     Weight 02/16/15 1034 180 lb (81.647 kg)     Height 02/16/15 1034 5\' 7"  (1.702 m)     Head Cir --      Peak Flow --      Pain Score 02/16/15 1034 8     Pain Loc --      Pain Edu? --      Excl. in [place]? --     Constitutional: Alert and oriented. Well appearing and in no distress. Eyes: Conjunctivae are normal. PERRL. Normal extraocular movements. ENT   Head: Normocephalic and atraumatic.   Nose: No congestion/rhinnorhea.   Mouth/Throat: Mucous membranes are moist.   Neck: No stridor. Cardiovascular: Normal rate, regular rhythm. Normal and symmetric distal pulses are present in all extremities. No murmurs, rubs, or gallops. Respiratory: Normal respiratory effort without tachypnea nor retractions. Breath sounds are clear and equal bilaterally. No wheezes/rales/rhonchi. Gastrointestinal: Soft and nontender. No distention. No abdominal bruits.  Musculoskeletal: Nontender with normal range of motion in all extremities. No joint effusions.  No lower extremity tenderness nor edema. Neurologic:  Normal speech and language. No gross focal neurologic deficits are appreciated. Speech is normal. No gait instability. Skin:  Skin is warm, dry and intact. No rash noted. Psychiatric: Mood and affect are normal. Speech and behavior are  normal. Patient exhibits appropriate insight and judgment. ____________________________________________  EKG: Interpreted by me. Normal sinus rhythm with a rate of 95 bpm, right bundle-branch block, left anterior fascicular block. No evidence of acute infarction.  ____________________________________________  ED COURSE:  Pertinent labs & imaging results that were available during my care of the patient were reviewed by me and considered in my medical decision making (see chart for details). We'll check basic labs, hydrate with 2 L of saline reevaluate. ____________________________________________    LABS (pertinent positives/negatives)  Labs Reviewed  BASIC METABOLIC PANEL - Abnormal; Notable for the following:    Sodium 133 (*)    Chloride 99 (*)    Glucose, Bld 366 (*)    All other components within normal limits  CBC - Abnormal; Notable for the following:    Platelets 80 (*)    All other components within normal limits  URINALYSIS COMPLETEWITH MICROSCOPIC ([facility] ONLY) - Abnormal; Notable for the following:    Color, Urine YELLOW (*)    APPearance CLEAR (*)    Glucose, UA >500 (*)  Squamous Epithelial / LPF 0-5 (*)    All other components within normal limits  GLUCOSE, CAPILLARY - Abnormal; Notable for the following:    Glucose-Capillary 328 (*)    All other components within normal limits  TROPONIN I  CBG MONITORING, ED   ____________________________________________  FINAL ASSESSMENT AND PLAN  Weakness, hyperglycemia  Plan: Patient with labs and imaging as dictated above. Patient is feeling better after 2 L of saline. No other acute emergency medical condition is identified. He is encouraged to have close follow-up with endocrinology. I will not adjust his 70/30 Novolin at this time due to his sugar dropping occasionally into the 20s. He is stable for outpatient follow-up   Earleen Newport, MD   Earleen Newport, MD 02/16/15 1314

## 2015-02-20 ENCOUNTER — Telehealth: Payer: Self-pay | Admitting: Nurse Practitioner

## 2015-02-20 NOTE — Telephone Encounter (Signed)
Elta Guadeloupe called from [facility] regarding if the diabetic supply prescription fax was received. Fax on 02/10/2015 and 02/14/2015. Elta Guadeloupe number is [phone]. Thank You!

## 2015-02-21 ENCOUNTER — Emergency Department
Admission: EM | Admit: 2015-02-21 | Discharge: 2015-02-21 | Disposition: A | Discharge status: Home / Self Care | Payer: Medicare HMO | Attending: Emergency Medicine | Admitting: Emergency Medicine

## 2015-02-21 ENCOUNTER — Encounter: Payer: Self-pay | Admitting: Pain Medicine

## 2015-02-21 ENCOUNTER — Encounter: Payer: Self-pay | Admitting: Emergency Medicine

## 2015-02-21 ENCOUNTER — Emergency Department: Payer: Medicare HMO

## 2015-02-21 ENCOUNTER — Ambulatory Visit: Payer: Medicare HMO | Admitting: Pain Medicine

## 2015-02-21 VITALS — BP 149/104 | HR 83 | Temp 98.0°F | Resp 16 | Ht 67.0 in | Wt 170.0 lb

## 2015-02-21 DIAGNOSIS — E1165 Type 2 diabetes mellitus with hyperglycemia: Secondary | ICD-10-CM | POA: Insufficient documentation

## 2015-02-21 DIAGNOSIS — M5030 Other cervical disc degeneration, unspecified cervical region: Secondary | ICD-10-CM

## 2015-02-21 DIAGNOSIS — Z720 Tobacco use: Secondary | ICD-10-CM | POA: Insufficient documentation

## 2015-02-21 DIAGNOSIS — I10 Essential (primary) hypertension: Secondary | ICD-10-CM | POA: Insufficient documentation

## 2015-02-21 DIAGNOSIS — D1809 Hemangioma of other sites: Secondary | ICD-10-CM

## 2015-02-21 DIAGNOSIS — M5412 Radiculopathy, cervical region: Secondary | ICD-10-CM | POA: Insufficient documentation

## 2015-02-21 DIAGNOSIS — M545 Low back pain: Secondary | ICD-10-CM | POA: Insufficient documentation

## 2015-02-21 DIAGNOSIS — R1012 Left upper quadrant pain: Secondary | ICD-10-CM | POA: Diagnosis present

## 2015-02-21 DIAGNOSIS — M4802 Spinal stenosis, cervical region: Secondary | ICD-10-CM | POA: Insufficient documentation

## 2015-02-21 DIAGNOSIS — M542 Cervicalgia: Secondary | ICD-10-CM

## 2015-02-21 DIAGNOSIS — R109 Unspecified abdominal pain: Secondary | ICD-10-CM

## 2015-02-21 DIAGNOSIS — M47816 Spondylosis without myelopathy or radiculopathy, lumbar region: Secondary | ICD-10-CM

## 2015-02-21 DIAGNOSIS — Z880 Allergy status to penicillin: Secondary | ICD-10-CM | POA: Insufficient documentation

## 2015-02-21 DIAGNOSIS — M47812 Spondylosis without myelopathy or radiculopathy, cervical region: Secondary | ICD-10-CM

## 2015-02-21 DIAGNOSIS — R0789 Other chest pain: Secondary | ICD-10-CM | POA: Diagnosis not present

## 2015-02-21 DIAGNOSIS — M5416 Radiculopathy, lumbar region: Secondary | ICD-10-CM

## 2015-02-21 DIAGNOSIS — R739 Hyperglycemia, unspecified: Secondary | ICD-10-CM

## 2015-02-21 DIAGNOSIS — M5126 Other intervertebral disc displacement, lumbar region: Secondary | ICD-10-CM | POA: Insufficient documentation

## 2015-02-21 DIAGNOSIS — M5481 Occipital neuralgia: Secondary | ICD-10-CM

## 2015-02-21 DIAGNOSIS — M5136 Other intervertebral disc degeneration, lumbar region: Secondary | ICD-10-CM

## 2015-02-21 LAB — COMPREHENSIVE METABOLIC PANEL
ALBUMIN: 4.1 g/dL (ref 3.5–5.0)
ALT: 115 U/L — ABNORMAL HIGH (ref 17–63)
ANION GAP: 8 (ref 5–15)
AST: 58 U/L — AB (ref 15–41)
Alkaline Phosphatase: 57 U/L (ref 38–126)
BUN: 8 mg/dL (ref 6–20)
CHLORIDE: 98 mmol/L — AB (ref 101–111)
CO2: 27 mmol/L (ref 22–32)
Calcium: 9.5 mg/dL (ref 8.9–10.3)
Creatinine, Ser: 0.65 mg/dL (ref 0.61–1.24)
GFR calc Af Amer: >60 mL/min (ref >60)
GFR calc non Af Amer: >60 mL/min (ref >60)
Glucose, Bld: 344 mg/dL — ABNORMAL HIGH (ref 65–99)
POTASSIUM: 4.5 mmol/L (ref 3.5–5.1)
SODIUM: 133 mmol/L — AB (ref 135–145)
Total Bilirubin: 1.1 mg/dL (ref 0.3–1.2)
Total Protein: 7.2 g/dL (ref 6.5–8.1)

## 2015-02-21 LAB — CBC
HEMATOCRIT: 46.5 % (ref 40.0–52.0)
HEMOGLOBIN: 14.9 g/dL (ref 13.0–18.0)
MCH: 27.7 pg (ref 26.0–34.0)
MCHC: 32 g/dL (ref 32.0–36.0)
MCV: 86.5 fL (ref 80.0–100.0)
Platelets: 95 10*3/uL — ABNORMAL LOW (ref 150–440)
RBC: 5.38 MIL/uL (ref 4.40–5.90)
RDW: 13.7 % (ref 11.5–14.5)
WBC: 7.9 10*3/uL (ref 3.8–10.6)

## 2015-02-21 LAB — URINALYSIS COMPLETE WITH MICROSCOPIC (ARMC ONLY)
Bilirubin Urine: NEGATIVE
Glucose, UA: >500 mg/dL — AB
Hgb urine dipstick: NEGATIVE
Ketones, ur: NEGATIVE mg/dL
Leukocytes, UA: NEGATIVE
NITRITE: NEGATIVE
PROTEIN: NEGATIVE mg/dL
RBC / HPF: NONE SEEN RBC/hpf (ref 0–5)
SPECIFIC GRAVITY, URINE: 1.021 (ref 1.005–1.030)
pH: 5 (ref 5.0–8.0)

## 2015-02-21 LAB — LIPASE, BLOOD: Lipase: 47 U/L (ref 11–51)

## 2015-02-21 MED ORDER — OXYCODONE-ACETAMINOPHEN 5-325 MG PO TABS
2.0000 | ORAL_TABLET | Freq: Once | ORAL | Status: AC
Start: 2015-02-21 — End: 2015-02-21
  Administered 2015-02-21: 2 via ORAL
  Filled 2015-02-21: qty 2

## 2015-02-21 NOTE — ED Notes (Signed)
Pt to ed with c/o left upper abd pain that radiate into back.  Pt states pain worse with deep breath or movement. Denies n/v/d.

## 2015-02-21 NOTE — Discharge Instructions (Signed)
Hyperglycemia °Hyperglycemia occurs when the glucose (sugar) in your blood is too high. Hyperglycemia can happen for many reasons, but it most often happens to people who do not know they have diabetes or are not managing their diabetes properly.  °CAUSES  °Whether you have diabetes or not, there are other causes of hyperglycemia. Hyperglycemia can occur when you have diabetes, but it can also occur in other situations that you might not be as aware of, such as: °Diabetes °· If you have diabetes and are having problems controlling your blood glucose, hyperglycemia could occur because of some of the following reasons: °¨ Not following your meal plan. °¨ Not taking your diabetes medications or not taking it properly. °¨ Exercising less or doing less activity than you normally do. °¨ Being sick. °Pre-diabetes °· This cannot be ignored. Before people develop Type 2 diabetes, they almost always have "pre-diabetes." This is when your blood glucose levels are higher than normal, but not yet high enough to be diagnosed as diabetes. Research has shown that some long-term damage to the body, especially the heart and circulatory system, may already be occurring during pre-diabetes. If you take action to manage your blood glucose when you have pre-diabetes, you may delay or prevent Type 2 diabetes from developing. °Stress °· If you have diabetes, you may be "diet" controlled or on oral medications or insulin to control your diabetes. However, you may find that your blood glucose is higher than usual in the hospital whether you have diabetes or not. This is often referred to as "stress hyperglycemia." Stress can elevate your blood glucose. This happens because of hormones put out by the body during times of stress. If stress has been the cause of your high blood glucose, it can be followed regularly by your caregiver. That way he/she can make sure your hyperglycemia does not continue to get worse or progress to  diabetes. °Steroids °· Steroids are medications that act on the infection fighting system (immune system) to block inflammation or infection. One side effect can be a rise in blood glucose. Most people can produce enough extra insulin to allow for this rise, but for those who cannot, steroids make blood glucose levels go even higher. It is not unusual for steroid treatments to "uncover" diabetes that is developing. It is not always possible to determine if the hyperglycemia will go away after the steroids are stopped. A special blood test called an A1c is sometimes done to determine if your blood glucose was elevated before the steroids were started. °SYMPTOMS °· Thirsty. °· Frequent urination. °· Dry mouth. °· Blurred vision. °· Tired or fatigue. °· Weakness. °· Sleepy. °· Tingling in feet or leg. °DIAGNOSIS  °Diagnosis is made by monitoring blood glucose in one or all of the following ways: °· A1c test. This is a chemical found in your blood. °· Fingerstick blood glucose monitoring. °· Laboratory results. °TREATMENT  °First, knowing the cause of the hyperglycemia is important before the hyperglycemia can be treated. Treatment may include, but is not be limited to: °· Education. °· Change or adjustment in medications. °· Change or adjustment in meal plan. °· Treatment for an illness, infection, etc. °· More frequent blood glucose monitoring. °· Change in exercise plan. °· Decreasing or stopping steroids. °· Lifestyle changes. °HOME CARE INSTRUCTIONS  °· Test your blood glucose as directed. °· Exercise regularly. Your caregiver will give you instructions about exercise. Pre-diabetes or diabetes which comes on with stress is helped by exercising. °· Eat wholesome,   balanced meals. Eat often and at regular, fixed times. Your caregiver or nutritionist will give you a meal plan to guide your sugar intake.  Being at an ideal weight is important. If needed, losing as little as 10 to 15 pounds may help improve blood  glucose levels. SEEK MEDICAL CARE IF:   You have questions about medicine, activity, or diet.  You continue to have symptoms (problems such as increased thirst, urination, or weight gain). SEEK IMMEDIATE MEDICAL CARE IF:   You are vomiting or have diarrhea.  Your breath smells fruity.  You are breathing faster or slower.  You are very sleepy or incoherent.  You have numbness, tingling, or pain in your feet or hands.  You have chest pain.  Your symptoms get worse even though you have been following your caregiver's orders.  If you have any other questions or concerns.   This information is not intended to replace advice given to you by your health care provider. Make sure you discuss any questions you have with your health care provider.   Document Released: 10/09/2000 Document Revised: 07/08/2011 Document Reviewed: 12/20/2014 Elsevier Interactive Patient Education 2016 Elsevier Inc. Flank Pain Flank pain refers to pain that is located on the side of the body between the upper abdomen and the back. The pain may occur over a short period of time (acute) or may be long-term or reoccurring (chronic). It may be mild or severe. Flank pain can be caused by many things. CAUSES  Some of the more common causes of flank pain include:  Muscle strains.   Muscle spasms.   A disease of your spine (vertebral disk disease).   A lung infection (pneumonia).   Fluid around your lungs (pulmonary edema).   A kidney infection.   Kidney stones.   A very painful skin rash caused by the chickenpox virus (shingles).   Gallbladder disease.  [place] care will depend on the cause of your pain. In general,  Rest as directed by your caregiver.  Drink enough fluids to keep your urine clear or pale yellow.  Only take over-the-counter or prescription medicines as directed by your caregiver. Some medicines may help relieve the pain.  Tell your caregiver about any  changes in your pain.  Follow up with your caregiver as directed. SEEK IMMEDIATE MEDICAL CARE IF:   Your pain is not controlled with medicine.   You have new or worsening symptoms.  Your pain increases.   You have abdominal pain.   You have shortness of breath.   You have persistent nausea or vomiting.   You have swelling in your abdomen.   You feel faint or pass out.   You have blood in your urine.  You have a fever or persistent symptoms for more than 2-3 days.  You have a fever and your symptoms suddenly get worse. MAKE SURE YOU:   Understand these instructions.  Will watch your condition.  Will get help right away if you are not doing well or get worse.   This information is not intended to replace advice given to you by your health care provider. Make sure you discuss any questions you have with your health care provider.   Document Released: 06/06/2005 Document Revised: 01/08/2012 Document Reviewed: 11/28/2011 Elsevier Interactive Patient Education [employer].

## 2015-02-21 NOTE — ED Provider Notes (Signed)
[facility] Emergency Department Provider Note     Time seen: ----------------------------------------- 10:38 AM on 02/21/2015 -----------------------------------------    I have reviewed the triage vital signs and the nursing notes.   HISTORY  Chief Complaint Abdominal Pain    HPI Kyle [name] is a 54 y.o. male who presents to ER with left upper abdominal pain that radiates in his back. Patient states worse with deep breath or movement. He denies nausea vomiting or diarrhea. Patient states blood sugars have remained elevated, was recently seen by me for same.Patient also has been doing some heavy lifting, complaining of left-sided low back and flank pain.   Past Medical History  Diagnosis Date  . Asthma   . DM2 (diabetes mellitus, type 2) ([place])   . COPD (chronic obstructive pulmonary disease) ([place])   . Fainting spell   . GERD (gastroesophageal reflux disease)   . Allergy   . Hyperlipidemia with target LDL less than 70   . Hypertension   . Urine incontinence   . Coronary artery disease involving native coronary artery     a. 4 vessel CABG 10/2012 at [facility]; b. [facility] 08/2014: small defect of mild severity present along apical lateral & apex location, no st segment changes,  LV function 45-54%, poor gated images lead to poor EF & wall motion assessment, low risk study  . S/P CABG x 4     a. LIMA-LAD, SVG-Ramus, SVG-OM,SVG-dRCA  . Chronic back pain 1981  . DDD (degenerative disc disease)   . Hepatitis C     2/2 tattoo  . Polysubstance abuse     a. ongoing daily tobacco abuse and etoh (on the weekends), remote crack/cocaine abuse last used approximately in 2006    Patient Active Problem List   Diagnosis Date Noted  . DDD (degenerative disc disease), lumbar 01/10/2015  . Facet syndrome, lumbar 01/10/2015  . Lumbar radiculopathy 01/10/2015  . DDD (degenerative disc disease), cervical 11/22/2014  . Cervical facet  syndrome 11/22/2014  . Bilateral occipital neuralgia 11/22/2014  . Hospital discharge follow-up 10/16/2014  . Coronary artery disease involving native coronary artery   . S/P CABG x 4   . Chest pain with moderate risk for cardiac etiology 09/27/2014  . Cervicalgia 07/05/2014  . Acute bacterial rhinosinusitis 06/15/2014  . Non compliance w medication regimen 06/07/2014  . Chest tightness 06/07/2014  . Tobacco abuse counseling 01/05/2014  . Vitamin D deficiency 01/05/2014  . Transaminitis 01/05/2014  . Temporary low platelet count ([place]) 01/05/2014  . Diabetes mellitus type 2, uncontrolled ([place]) 01/05/2014  . Back pain 10/01/2013  . Need for prophylactic vaccination with tetanus-diphtheria (TD) 10/01/2013  . Penile lesion 10/01/2013  . Essential hypertension 08/29/2013  . Hyperlipidemia with target LDL less than 70 08/29/2013  . Callus of foot 08/29/2013  . Blurred vision 08/29/2013  . Itching 08/29/2013  . Exacerbation of chronic back pain 06/07/2013  . Hyperlipidemia 03/02/2013  . Former smoker 03/02/2013  . Routine general medical examination at a health care facility 02/24/2013  . Hepatitis C 02/24/2013  . Diabetes type 2, uncontrolled ([place]) 02/24/2013    Past Surgical History  Procedure Laterality Date  . Coronary artery bypass graft  8/14    CABG x 4; LIMA-LAD, SVG-RI, SVG-OM1, SVG-rPDA (Atretic RIMA).  . Cardiac catheterization  11/2012    [facility] - Multivessel CAD  . Neck injection      Allergies Amoxicillin; Flexeril; Methadone; Tramadol; and Trazodone and nefazodone  Social History Social  History  Substance Use Topics  . Smoking status: Current Every Day Smoker -- 35 years    Types: Cigarettes    Last Attempt to Quit: 11/24/2012  . Smokeless tobacco: Never Used  . Alcohol Use: Yes     Comment: occasional    Review of Systems Constitutional: Negative for fever. Eyes: Negative for visual changes. ENT: Negative for sore  throat. Cardiovascular: Negative for chest pain. Respiratory: Negative for shortness of breath. Gastrointestinal: Positive for left flank pain Genitourinary: Negative for dysuria. Musculoskeletal: Negative for back pain. Skin: Negative for rash. Neurological: Negative for headaches, focal weakness or numbness.  10-point ROS otherwise negative.  ____________________________________________   PHYSICAL EXAM:  VITAL SIGNS: ED Triage Vitals  Enc Vitals Group     BP 02/21/15 0922 155/102 mmHg     Pulse Rate 02/21/15 0922 80     Resp 02/21/15 0922 20     Temp 02/21/15 0922 97.5 F (36.4 C)     Temp Source 02/21/15 0922 Oral     SpO2 02/21/15 0922 98 %     Weight 02/21/15 0922 170 lb (77.111 kg)     Height 02/21/15 0922 5\' 8"  (1.727 m)     Head Cir --      Peak Flow --      Pain Score 02/21/15 0923 7     Pain Loc --      Pain Edu? --      Excl. in [place]? --     Constitutional: Alert and oriented. Well appearing and in no distress. Eyes: Conjunctivae are normal. PERRL. Normal extraocular movements. ENT   Head: Normocephalic and atraumatic.   Nose: No congestion/rhinnorhea.   Mouth/Throat: Mucous membranes are moist.   Neck: No stridor. Cardiovascular: Normal rate, regular rhythm. Normal and symmetric distal pulses are present in all extremities. No murmurs, rubs, or gallops. Respiratory: Normal respiratory effort without tachypnea nor retractions. Breath sounds are clear and equal bilaterally. No wheezes/rales/rhonchi. Gastrointestinal: Soft and nontender. No distention. No abdominal bruits.  Musculoskeletal: Left flank and left axillary chest wall tenderness, worse with range of motion of the chest Neurologic:  Normal speech and language. No gross focal neurologic deficits are appreciated. Speech is normal. No gait instability. Skin:  Skin is warm, dry and intact. No rash noted. Psychiatric: Mood and affect are normal. Speech and behavior are normal. Patient exhibits  appropriate insight and judgment. ____________________________________________  EKG: Interpreted by me. Normal sinus rhythm rate of 76 bpm, right bundle branch block, left anterior fascicular block. Left axis deviation  ____________________________________________  ED COURSE:  Pertinent labs & imaging results that were available during my care of the patient were reviewed by me and considered in my medical decision making (see chart for details). We'll check basic labs, obtain rib films and reevaluate. ____________________________________________    LABS (pertinent positives/negatives)  Labs Reviewed  CBC - Abnormal; Notable for the following:    Platelets 95 (*)    All other components within normal limits  URINALYSIS COMPLETEWITH MICROSCOPIC ([facility] ONLY) - Abnormal; Notable for the following:    Color, Urine YELLOW (*)    APPearance CLEAR (*)    Glucose, UA >500 (*)    Bacteria, UA RARE (*)    Squamous Epithelial / LPF 0-5 (*)    All other components within normal limits  LIPASE, BLOOD  COMPREHENSIVE METABOLIC PANEL    RADIOLOGY Images were viewed by me  rib x-rays IMPRESSION: No findings to explain the patient's pain. ____________________________________________  FINAL ASSESSMENT AND  PLAN  Flank pain, hyperglycemia  Plan: Patient with labs and imaging as dictated above. Patient is in no acute distress, has reproducible left chest wall and side tenderness. This is likely muscular. He also has hyperglycemia that is persistent. Advised increasing his 7030 insulin and having close follow-up with endocrinology.   Earleen Newport, MD   Earleen Newport, MD 02/21/15 [phone]

## 2015-02-21 NOTE — Progress Notes (Signed)
Pastoral Care and prayer provided for patient. °

## 2015-02-21 NOTE — Progress Notes (Signed)
Safety precautions to be maintained throughout the outpatient stay will include: orient to surroundings, keep bed in low position, maintain call bell within reach at all times, provide assistance with transfer out of bed and ambulation.  

## 2015-02-21 NOTE — Patient Instructions (Addendum)
PLAN   Continue present medications  Cervical epidural steroid injection to be performed at time of return appointment  F/U PCP C Doss for evaliation of  BP and general medical  condition  F/U surgical evaluation as planned. Please ask staff date of your neurosurgical evaluation  F/U neurological evaluation. May consider pending follow-up evaluations  May consider radiofrequency rhizolysis or intraspinal procedures pending response to present treatment and F/U evaluation   Patient to call Pain Management Center should patient have concerns prior to scheduled return appointment.Epidural Steroid Injection An epidural steroid injection is given to relieve pain in your neck, back, or legs that is caused by the irritation or swelling of a nerve root. This procedure involves injecting a steroid and numbing medicine (anesthetic) into the epidural space. The epidural space is the space between the outer covering of your spinal cord and the bones that form your backbone (vertebra).  LET [facility] CARE PROVIDER KNOW ABOUT:  1. Any allergies you have. 2. All medicines you are taking, including vitamins, herbs, eye drops, creams, and over-the-counter medicines such as aspirin. 3. Previous problems you or members of your family have had with the use of anesthetics. 4. Any blood disorders or blood clotting disorders you have. 5. Previous surgeries you have had. 6. Medical conditions you have. RISKS AND COMPLICATIONS Generally, this is a safe procedure. However, as with any procedure, complications can occur. Possible complications of epidural steroid injection include:  Headache.  Bleeding.  Infection.  Allergic reaction to the medicines.  Damage to your nerves. The response to this procedure depends on the underlying cause of the pain and its duration. People who have long-term (chronic) pain are less likely to benefit from epidural steroids than are those people whose pain comes on strong  and suddenly. BEFORE THE PROCEDURE   Ask your health care provider about changing or stopping your regular medicines. You may be advised to stop taking blood-thinning medicines a few days before the procedure.  You may be given medicines to reduce anxiety.  Arrange for someone to take you home after the procedure. PROCEDURE   You will remain awake during the procedure. You may receive medicine to make you relaxed.  You will be asked to lie on your stomach.  The injection site will be cleaned.  The injection site will be numbed with a medicine (local anesthetic).  A needle will be injected through your skin into the epidural space.  Your health care provider will use an X-ray machine to ensure that the steroid is delivered closest to the affected nerve. You may have minimal discomfort at this time.  Once the needle is in the right position, the local anesthetic and the steroid will be injected into the epidural space.  The needle will then be removed and a bandage will be applied to the injection site. AFTER THE PROCEDURE  1. You may be monitored for a short time before you go home. 2. You may feel weakness or numbness in your arm or leg, which disappears within hours. 3. You may be allowed to eat, drink, and take your regular medicine. 4. You may have soreness at the site of the injection.   This information is not intended to replace advice given to you by your health care provider. Make sure you discuss any questions you have with your health care provider.   Document Released: 07/23/2007 Document Revised: 12/16/2012 Document Reviewed: 10/02/2012 Elsevier Interactive Patient Education [employer].   Patient may stop aspirin  5 days before procedure.Pain Management Discharge Instructions  General Discharge Instructions :  If you need to reach your doctor call: Monday-Friday 8:00 am - 4:00 pm at [phone] or toll free [phone].  After clinic hours [phone]  to have operator reach doctor.  Bring all of your medication bottles to all your appointments in the pain clinic.  To cancel or reschedule your appointment with Pain Management please remember to call 24 hours in advance to avoid a fee.  Refer to the educational materials which you have been given on: General Risks, I had my Procedure. Discharge Instructions, Post Sedation.  Post Procedure Instructions:  The drugs you were given will stay in your system until tomorrow, so for the next 24 hours you should not drive, make any legal decisions or drink any alcoholic beverages.  You may eat anything you prefer, but it is better to start with liquids then soups and crackers, and gradually work up to solid foods.  Please notify your doctor immediately if you have any unusual bleeding, trouble breathing or pain that is not related to your normal pain.  Depending on the type of procedure that was done, some parts of your body may feel week and/or numb.  This usually clears up by tonight or the next day.  Walk with the use of an assistive device or accompanied by an adult for the 24 hours.  You may use ice on the affected area for the first 24 hours.  Put ice in a Ziploc bag and cover with a towel and place against area 15 minutes on 15 minutes off.  You may switch to heat after 24 hours.GENERAL RISKS AND COMPLICATIONS  What are the risk, side effects and possible complications? Generally speaking, most procedures are safe.  However, with any procedure there are risks, side effects, and the possibility of complications.  The risks and complications are dependent upon the sites that are lesioned, or the type of nerve block to be performed.  The closer the procedure is to the spine, the more serious the risks are.  Great care is taken when placing the radio frequency needles, block needles or lesioning probes, but sometimes complications can occur. 1. Infection: Any time there is an injection through the  skin, there is a risk of infection.  This is why sterile conditions are used for these blocks.  There are four possible types of infection. 1. Localized skin infection. 2. Central Nervous System Infection-This can be in the form of Meningitis, which can be deadly. 3. Epidural Infections-This can be in the form of an epidural abscess, which can cause pressure inside of the spine, causing compression of the spinal cord with subsequent paralysis. This would require an emergency surgery to decompress, and there are no guarantees that the patient would recover from the paralysis. 4. Discitis-This is an infection of the intervertebral discs.  It occurs in about 1% of discography procedures.  It is difficult to treat and it may lead to surgery.        2. Pain: the needles have to go through skin and soft tissues, will cause soreness.       3. Damage to internal structures:  The nerves to be lesioned may be near blood vessels or    other nerves which can be potentially damaged.       4. Bleeding: Bleeding is more common if the patient is taking blood thinners such as  aspirin, Coumadin, Ticiid, Plavix, etc., or if he/she have some genetic  predisposition  such as hemophilia. Bleeding into the spinal canal can cause compression of the spinal  cord with subsequent paralysis.  This would require an emergency surgery to  decompress and there are no guarantees that the patient would recover from the  paralysis.       5. Pneumothorax:  Puncturing of a lung is a possibility, every time a needle is introduced in  the area of the chest or upper back.  Pneumothorax refers to free air around the  collapsed lung(s), inside of the thoracic cavity (chest cavity).  Another two possible  complications related to a similar event would include: Hemothorax and Chylothorax.   These are variations of the Pneumothorax, where instead of air around the collapsed  lung(s), you may have blood or chyle, respectively.       6. Spinal  headaches: They may occur with any procedures in the area of the spine.       7. Persistent CSF (Cerebro-Spinal Fluid) leakage: This is a rare problem, but may occur  with prolonged intrathecal or epidural catheters either due to the formation of a fistulous  track or a dural tear.       8. Nerve damage: By working so close to the spinal cord, there is always a possibility of  nerve damage, which could be as serious as a permanent spinal cord injury with  paralysis.       9. Death:  Although rare, severe deadly allergic reactions known as "Anaphylactic  reaction" can occur to any of the medications used.      10. Worsening of the symptoms:  We can always make thing worse.  What are the chances of something like this happening? Chances of any of this occuring are extremely low.  By statistics, you have more of a chance of getting killed in a motor vehicle accident: while driving to the hospital than any of the above occurring .  Nevertheless, you should be aware that they are possibilities.  In general, it is similar to taking a shower.  Everybody knows that you can slip, hit your head and get killed.  Does that mean that you should not shower again?  Nevertheless always keep in mind that statistics do not mean anything if you happen to be on the wrong side of them.  Even if a procedure has a 1 (one) in a 1,000,000 (million) chance of going wrong, it you happen to be that one..Also, keep in mind that by statistics, you have more of a chance of having something go wrong when taking medications.  Who should not have this procedure? If you are on a blood thinning medication (e.g. Coumadin, Plavix, see list of "Blood Thinners"), or if you have an active infection going on, you should not have the procedure.  If you are taking any blood thinners, please inform your physician.  How should I prepare for this procedure?  Do not eat or drink anything at least six hours prior to the procedure.  Bring a driver  with you .  It cannot be a taxi.  Come accompanied by an adult that can drive you back, and that is strong enough to help you if your legs get weak or numb from the local anesthetic.  Take all of your medicines the morning of the procedure with just enough water to swallow them.  If you have diabetes, make sure that you are scheduled to have your procedure done first thing in the morning, whenever possible.  If you have diabetes, take only half of your insulin dose and notify our nurse that you have done so as soon as you arrive at the clinic.  If you are diabetic, but only take blood sugar pills (oral hypoglycemic), then do not take them on the morning of your procedure.  You may take them after you have had the procedure.  Do not take aspirin or any aspirin-containing medications, at least eleven (11) days prior to the procedure.  They may prolong bleeding.  Wear loose fitting clothing that may be easy to take off and that you would not mind if it got stained with Betadine or blood.  Do not wear any jewelry or perfume  Remove any nail coloring.  It will interfere with some of our monitoring equipment.  NOTE: Remember that this is not meant to be interpreted as a complete list of all possible complications.  Unforeseen problems may occur.  BLOOD THINNERS The following drugs contain aspirin or other products, which can cause increased bleeding during surgery and should not be taken for 2 weeks prior to and 1 week after surgery.  If you should need take something for relief of minor pain, you may take acetaminophen which is found in Tylenol,m Datril, Anacin-3 and Panadol. It is not blood thinner. The products listed below are.  Do not take any of the products listed below in addition to any listed on your instruction sheet.  A.P.C or A.P.C with Codeine Codeine Phosphate Capsules #3 Ibuprofen Ridaura  ABC compound Congesprin Imuran rimadil  Advil Cope Indocin Robaxisal  Alka-Seltzer  Effervescent Pain Reliever and Antacid Coricidin or Coricidin-D  Indomethacin Rufen  Alka-Seltzer plus Cold Medicine Cosprin Ketoprofen S-A-C Tablets  Anacin Analgesic Tablets or Capsules Coumadin Korlgesic Salflex  Anacin Extra Strength Analgesic tablets or capsules CP-2 Tablets Lanoril Salicylate  Anaprox Cuprimine Capsules Levenox Salocol  Anexsia-D Dalteparin Magan Salsalate  Anodynos Darvon compound Magnesium Salicylate Sine-off  Ansaid Dasin Capsules Magsal Sodium Salicylate  Anturane Depen Capsules Marnal Soma  APF Arthritis pain formula Dewitt's Pills Measurin [place]back  Argesic Dia-Gesic Meclofenamic Sulfinpyrazone  Arthritis Bayer Timed Release Aspirin Diclofenac Meclomen Sulindac  Arthritis pain formula Anacin Dicumarol Medipren Supac  Analgesic (Safety coated) Arthralgen Diffunasal Mefanamic Suprofen  Arthritis Strength Bufferin Dihydrocodeine Mepro Compound Suprol  Arthropan liquid Dopirydamole Methcarbomol with Aspirin Synalgos  ASA tablets/Enseals Disalcid Micrainin Tagament  Ascriptin Doan's Midol Talwin  Ascriptin A/D Dolene Mobidin Tanderil  Ascriptin Extra Strength Dolobid Moblgesic Ticlid  Ascriptin with Codeine Doloprin or Doloprin with Codeine Momentum Tolectin  Asperbuf Duoprin Mono-gesic Trendar  Aspergum Duradyne Motrin or Motrin IB Triminicin  Aspirin plain, buffered or enteric coated Durasal Myochrisine Trigesic  Aspirin Suppositories Easprin Nalfon Trillsate  Aspirin with Codeine Ecotrin Regular or Extra Strength Naprosyn Uracel  Atromid-S Efficin Naproxen Ursinus  Auranofin Capsules Elmiron Neocylate Vanquish  Axotal Emagrin Norgesic Verin  Azathioprine Empirin or Empirin with Codeine Normiflo Vitamin E  Azolid Emprazil Nuprin Voltaren  Bayer Aspirin plain, buffered or children's or timed BC Tablets or powders Encaprin Orgaran Warfarin Sodium  Buff-a-Comp Enoxaparin Orudis Zorpin  Buff-a-Comp with Codeine Equegesic Os-Cal-Gesic   Buffaprin Excedrin  plain, buffered or Extra Strength Oxalid   Bufferin Arthritis Strength Feldene Oxphenbutazone   Bufferin plain or Extra Strength Feldene Capsules Oxycodone with Aspirin   Bufferin with Codeine Fenoprofen Fenoprofen Pabalate or Pabalate-SF   Buffets II Flogesic Panagesic   Buffinol plain or Extra Strength Florinal or Florinal with Codeine Panwarfarin   Buf-Tabs Flurbiprofen Penicillamine   Butalbital  Compound Four-way cold tablets Penicillin   Butazolidin Fragmin Pepto-Bismol   Carbenicillin Geminisyn Percodan   Carna Arthritis Reliever Geopen Persantine   Carprofen Gold's salt Persistin   Chloramphenicol Goody's Phenylbutazone   Chloromycetin Haltrain Piroxlcam   Clmetidine heparin Plaquenil   Cllnoril Hyco-pap Ponstel   Clofibrate Hydroxy chloroquine Propoxyphen         Before stopping any of these medications, be sure to consult the physician who ordered them.  Some, such as Coumadin (Warfarin) are ordered to prevent or treat serious conditions such as "deep thrombosis", "pumonary embolisms", and other heart problems.  The amount of time that you may need off of the medication may also vary with the medication and the reason for which you were taking it.  If you are taking any of these medications, please make sure you notify your pain physician before you undergo any procedures.

## 2015-02-21 NOTE — Progress Notes (Signed)
Subjective:    Patient ID: Kyle [name], male    DOB: 19-Oct-1960, 54 y.o.   MRN: [id number]  HPI Patient is 54 year old gentleman who [facility] and treatment of pain involving the neck upper extremity regions as well as the lower back and lower extremity regions. Patient states she continues to have significant pain involving the neck and upper extremity regions. Patient denies any recent trauma change in events of daily living the cause change in symptomatology. We discussed further surgical evaluation of patient as well as medications for treatment of patient's pain. We informed patient that we wish to obtain permission from [employer] regarding prescribing his medications and that we will proceed with cervical epidural steroid injection at time return appointment. The patient was with understanding and agreement status treatment plan. We also discussed neurosurgical evaluation and patient will proceed with neurosurgical evaluation as discussed and as well. The patient stated pain in the neck was associated with pain with range of motion maneuvers and that he had experienced weakness of the upper extremities as well. We informed patient will proceed with cervical epidural steroid injection in attempt to decrease severity of symptoms, minimize progression of symptoms, and avoid need for more involved treatment. The patient was with understanding and in agreement with suggested treatment plan   Review of Systems     Objective:   Physical Exam  There was tenderness of the splenius capitis and occipitalis musculature regions of moderate to moderately severe degree. There was tennis over the cervical facet cervical paraspinal musculature regions of moderate to moderately severe degree. There appeared to be unremarkable Spurling's maneuver. Patient was with questions he decreased grip strength. Tinel and Phalen's maneuver without increased pain of  significant degree. Palpation over the thoracic facet thoracic paraspinal musculature region was a tends to palpation of moderate degree. No crepitus of the thoracic region was noted. Palpation over the lumbar paraspinal muscles lumbar facet region was with moderate discomfort. Lateral bending and rotation and extension and palpation of the lumbar facets reproduce moderate discomfort. Straight leg raising was tolerates approximately 20 without increased pain with dorsiflexion noted. DTRs were difficult to elicit Patient had difficulty relaxing.. There was mild tenderness of the greater trochanteric region iliotibial band region. No definite sensory deficit of dermatomal distribution of the lower extremities were noted and there was negative clonus and negative Homans. Abdomen is nontender with no costovertebral angle tenderness noted.      Assessment & Plan:    1. 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 Is also mild spinal stenosis at C4-5. 2. Bulging discs and mild facet disease in the lumbar spine but no significant disc protrusions, spinal or foraminal stenosis  Cervical facet syndrome  Cervical radiculopathy  MRI of The lumbar Spine.Normal alignment of the lumbar vertebral bodies. They demonstrate normal marrow signal except for a few small scattered hemangiomas.  Lumbar facet syndrome  Sacroiliac joint dysfunction     PLAN   Continue present medications We have requested permission of C Doss to prescribe medications for treatment of patient's pain  Cervical epidural steroid injection to be performed at time return appointment   F/U PCP Dr. Haskell Riling  for evaliation of  BP and general medical  condition  F/U surgical evaluation.. Patient has been scheduled for neurosurgical evaluation of the cervical and upper extremity regions and lumbar and lower extremity regions   F/U neurological evaluation. May consider pending follow-up evaluations  May consider  radiofrequency rhizolysis or intraspinal procedures pending response to present treatment and F/U evaluation

## 2015-02-22 ENCOUNTER — Telehealth: Payer: Self-pay | Admitting: Pain Medicine

## 2015-02-22 NOTE — Telephone Encounter (Signed)
Says Dr. Primus Bravo told him he was going to write meds on the 25th when he was here but did not give him scripts or call in any meds/ he is in pain and needs something to help with pain

## 2015-02-22 NOTE — Telephone Encounter (Signed)
Patient instructed that we have gotten permission to prescribe from Dr. Flonnie Overman and that Dr. Primus Bravo will prescribe at next visit. Waiting on ins approval to schedule patient for CESI.

## 2015-02-24 ENCOUNTER — Ambulatory Visit: Payer: Medicare HMO | Admitting: Nurse Practitioner

## 2015-02-24 ENCOUNTER — Telehealth: Payer: Self-pay | Admitting: Nurse Practitioner

## 2015-02-24 DIAGNOSIS — Z0289 Encounter for other administrative examinations: Secondary | ICD-10-CM

## 2015-02-24 NOTE — Telephone Encounter (Signed)
Pt is non-compliant with following up. We will work on discharge paper work due to non-compliance, 2 no shows, and multiple same day cancellations.

## 2015-02-27 ENCOUNTER — Telehealth: Payer: Self-pay | Admitting: Nurse Practitioner

## 2015-02-27 ENCOUNTER — Telehealth: Payer: Self-pay | Admitting: Pain Medicine

## 2015-02-27 NOTE — Telephone Encounter (Signed)
Pt called to see if he could get any type of pain medicine for his sever neck pain.please advise pt.

## 2015-02-27 NOTE — Telephone Encounter (Signed)
Nurses  Please call patient regarding his appointment for procedure cervical epidural steroid injection and asked Angie if she has submitted request for approval for cervical epidural steroid injection Please address medications with patient as well after you speak with me

## 2015-02-27 NOTE — Telephone Encounter (Signed)
Patient is awaiting auth for cervical epidural and wants a call back concerning his pain

## 2015-02-27 NOTE — Telephone Encounter (Signed)
I will defer to Dr. Ethel Rana office since they are working on this.

## 2015-02-27 NOTE — Telephone Encounter (Signed)
The request for auth was submitted last week.

## 2015-02-27 NOTE — Telephone Encounter (Signed)
Please advise?  He had a call to Dr. Ethel Rana office this am today regarding the same thing, looks like they are trying to get approval for a steroid injection?

## 2015-02-28 ENCOUNTER — Telehealth: Payer: Self-pay | Admitting: Nurse Practitioner

## 2015-02-28 ENCOUNTER — Encounter: Payer: Self-pay | Admitting: Nurse Practitioner

## 2015-02-28 ENCOUNTER — Telehealth: Payer: Self-pay

## 2015-02-28 NOTE — Telephone Encounter (Signed)
Patient dismissal process started in [facility] 

## 2015-02-28 NOTE — Telephone Encounter (Signed)
Patient dismissed from [facility] by Lorane Gell NP-C , effective February 28, 2015. Dismissal letter sent out by certified / registered mail.  DAJ  Received signed domestic return receipt verifying delivery of certified letter on March 06, 2015. Article number [id number] Page [phone]

## 2015-03-08 ENCOUNTER — Ambulatory Visit: Payer: Medicare HMO | Attending: Pain Medicine | Admitting: Pain Medicine

## 2015-03-08 ENCOUNTER — Encounter: Payer: Self-pay | Admitting: Pain Medicine

## 2015-03-08 VITALS — BP 166/96 | HR 79 | Temp 98.3°F | Resp 18 | Ht 67.0 in | Wt 170.0 lb

## 2015-03-08 DIAGNOSIS — M79601 Pain in right arm: Secondary | ICD-10-CM | POA: Diagnosis present

## 2015-03-08 DIAGNOSIS — M47816 Spondylosis without myelopathy or radiculopathy, lumbar region: Secondary | ICD-10-CM

## 2015-03-08 DIAGNOSIS — M5416 Radiculopathy, lumbar region: Secondary | ICD-10-CM

## 2015-03-08 DIAGNOSIS — M5030 Other cervical disc degeneration, unspecified cervical region: Secondary | ICD-10-CM

## 2015-03-08 DIAGNOSIS — M5136 Other intervertebral disc degeneration, lumbar region: Secondary | ICD-10-CM

## 2015-03-08 DIAGNOSIS — M4802 Spinal stenosis, cervical region: Secondary | ICD-10-CM | POA: Insufficient documentation

## 2015-03-08 DIAGNOSIS — M542 Cervicalgia: Secondary | ICD-10-CM | POA: Diagnosis present

## 2015-03-08 DIAGNOSIS — M79602 Pain in left arm: Secondary | ICD-10-CM | POA: Diagnosis present

## 2015-03-08 DIAGNOSIS — M47812 Spondylosis without myelopathy or radiculopathy, cervical region: Secondary | ICD-10-CM

## 2015-03-08 DIAGNOSIS — M5481 Occipital neuralgia: Secondary | ICD-10-CM

## 2015-03-08 MED ORDER — ORPHENADRINE CITRATE 30 MG/ML IJ SOLN
INTRAMUSCULAR | Status: AC
  Administered 2015-03-08: 10:00:00
  Filled 2015-03-08: qty 2

## 2015-03-08 MED ORDER — CIPROFLOXACIN HCL 250 MG PO TABS: 250.0000 mg | ORAL_TABLET | Freq: Two times a day (BID) | ORAL | Status: DC

## 2015-03-08 MED ORDER — LACTATED RINGERS IV SOLN: 1000.0000 mL | INTRAVENOUS | Status: DC

## 2015-03-08 MED ORDER — CIPROFLOXACIN IN D5W 400 MG/200ML IV SOLN
INTRAVENOUS | Status: AC
  Administered 2015-03-08: 400 mg via INTRAVENOUS
  Filled 2015-03-08: qty 200

## 2015-03-08 MED ORDER — CIPROFLOXACIN IN D5W 400 MG/200ML IV SOLN
400.0000 mg | Freq: Once | INTRAVENOUS | Status: AC
  Administered 2015-03-08: 400 mg via INTRAVENOUS

## 2015-03-08 MED ORDER — BUPIVACAINE HCL (PF) 0.25 % IJ SOLN
INTRAMUSCULAR | Status: AC
  Administered 2015-03-08: 10:00:00
  Filled 2015-03-08: qty 30

## 2015-03-08 MED ORDER — TRIAMCINOLONE ACETONIDE 40 MG/ML IJ SUSP
40.0000 mg | Freq: Once | INTRAMUSCULAR | Status: AC
  Administered 2015-03-08: 10:00:00

## 2015-03-08 MED ORDER — TRIAMCINOLONE ACETONIDE 40 MG/ML IJ SUSP
INTRAMUSCULAR | Status: AC
  Filled 2015-03-08: qty 1

## 2015-03-08 MED ORDER — SODIUM CHLORIDE 0.9 % IJ SOLN
20.0000 mL | Freq: Once | INTRAMUSCULAR | Status: AC
  Administered 2015-03-08: 10:00:00

## 2015-03-08 MED ORDER — LIDOCAINE HCL (PF) 1 % IJ SOLN
10.0000 mL | Freq: Once | INTRAMUSCULAR | Status: AC
  Administered 2015-03-08: 10:00:00 via SUBCUTANEOUS

## 2015-03-08 MED ORDER — SODIUM CHLORIDE 0.9 % IJ SOLN
INTRAMUSCULAR | Status: AC
  Filled 2015-03-08: qty 20

## 2015-03-08 MED ORDER — LIDOCAINE HCL (PF) 1 % IJ SOLN
INTRAMUSCULAR | Status: AC
  Filled 2015-03-08: qty 5

## 2015-03-08 NOTE — Progress Notes (Signed)
   Subjective:    Patient ID: Kyle [name], male    DOB: 1960-12-01, 54 y.o.   MRN: [id number]  HPI  PROCEDURE PERFORMED: Cervical epidural steroid injection   NOTE: The patient is a  54 year old gentleman who [facility] evaluation and prescription of pain involving the neck and upper extremity region. MRI  revealed the patient to be with evidence of 1. 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 Is also mild spinal stenosis at C4-5. 2. Bulging discs and mild facet disease in the lumbar spine but no significant disc protrusions, spinal or foraminal stenosis. The risks, benefits, and expectations of the procedure have been discussed and explained to the patient who was understanding and wished to proceed with interventional treatment as planned. Will proceed with interventional treatment as discussed and as explained to the patient.  DESCRIPTION OF PROCEDURE: Cervical epidural steroid injection with EKG, blood pressure, pulse, and pulse oximetry monitoring. The procedure was performed with the patient in the prone position under fluoroscopic guidance. With AP view of the cervical spine, the patient in the prone position, Betadine prep of proposed entry site was performed. Following local anesthetic skin wheal of 1.5% plain lidocaine of proposed needle entry site, 18-gauge Tuohy epidural needle inserted via hanging drop technique at the C 7 vertebral body level with needle placed at the left of the midline under fluoroscopic guidance. Following needle placement under fluoroscopic guidance, a total of 4 mL of Preservative-Free normal saline with 40 mg of Kenalog injected incrementally via cervical epidural placed needle. Needle removed. The patient tolerated the injection well.   PLAN:  1. Medications: Will continue presently prescribed medications  2. Will consider modification of treatment regimen at time of return appointment pending  response to treatment rendered on today's visit.  3. The patient is to follow-up with primary care physician, Dr.C Doss, for evaluation of blood pressure and general medical condition status post steroid injection performed on today's visit.  4. Surgical evaluation.  Patient will undergo further evaluation as discussed 5. Neurological evaluation.  May consider PNCV EMG studies 6. May consider radiofrequency procedures, implantation device, and other treatment pending response to treatment and follow-up evaluation.  7. The patient has been advised to adhere to proper body mechanics and avoid activities which appear to aggravate condition.  8. The patient has been advised to call the [facility] prior to scheduled return appointment should there be significant change in condition or should patient have other concerns regarding condition prior to scheduled return appointment.  The patient is understanding and in agreement with suggested treatment plan.   Review of Systems     Objective:   Physical Exam        Assessment & Plan:

## 2015-03-08 NOTE — Progress Notes (Signed)
Safety precautions to be maintained throughout the outpatient stay will include: orient to surroundings, keep bed in low position, maintain call bell within reach at all times, provide assistance with transfer out of bed and ambulation.  

## 2015-03-08 NOTE — Patient Instructions (Addendum)
PLAN   Continue present medications and please obtain your Cipro antibiotic today and begin taking Cipro antibiotic today as prescribed  F/U PCP C Doss for evaliation of  BP and general medical  condition  F/U surgical evaluation as planned.   F/U neurological evaluation. May consider pending follow-up evaluations  May consider radiofrequency rhizolysis or intraspinal procedures pending response to present treatment and F/U evaluation   Patient to call [facility] should patient have concerns prior to scheduled return appointment.Epidural Steroid Injection An epidural steroid injection is given to relieve pain in your neck, back, or legs that is caused by the irritation or swelling of a nerve root. This procedure involves injecting a steroid and numbing medicine (anesthetic) into the epidural space. The epidural space is the space between the outer covering of your spinal cord and the bones that form your backbone (vertebra).  LET [facility] CARE PROVIDER KNOW ABOUT:   Any allergies you have.  All medicines you are taking, including vitamins, herbs, eye drops, creams, and over-the-counter medicines such as aspirin.  Previous problems you or members of your family have had with the use of anesthetics.  Any blood disorders or blood clotting disorders you have.  Previous surgeries you have had.  Medical conditions you have. RISKS AND COMPLICATIONS Generally, this is a safe procedure. However, as with any procedure, complications can occur. Possible complications of epidural steroid injection include:  Headache.  Bleeding.  Infection.  Allergic reaction to the medicines.  Damage to your nerves. The response to this procedure depends on the underlying cause of the pain and its duration. People who have long-term (chronic) pain are less likely to benefit from epidural steroids than are those people whose pain comes on strong and suddenly. BEFORE THE PROCEDURE   Ask  your health care provider about changing or stopping your regular medicines. You may be advised to stop taking blood-thinning medicines a few days before the procedure.  You may be given medicines to reduce anxiety.  Arrange for someone to take you home after the procedure. PROCEDURE   You will remain awake during the procedure. You may receive medicine to make you relaxed.  You will be asked to lie on your stomach.  The injection site will be cleaned.  The injection site will be numbed with a medicine (local anesthetic).  A needle will be injected through your skin into the epidural space.  Your health care provider will use an X-ray machine to ensure that the steroid is delivered closest to the affected nerve. You may have minimal discomfort at this time.  Once the needle is in the right position, the local anesthetic and the steroid will be injected into the epidural space.  The needle will then be removed and a bandage will be applied to the injection site. AFTER THE PROCEDURE   You may be monitored for a short time before you go home.  You may feel weakness or numbness in your arm or leg, which disappears within hours.  You may be allowed to eat, drink, and take your regular medicine.  You may have soreness at the site of the injection.   This information is not intended to replace advice given to you by your health care provider. Make sure you discuss any questions you have with your health care provider.   Document Released: 07/23/2007 Document Revised: 12/16/2012 Document Reviewed: 10/02/2012 Elsevier Interactive Patient Education 2016 [place]. Pain Management Discharge Instructions  General Discharge Instructions :  If  you need to reach your doctor call: Monday-Friday 8:00 am - 4:00 pm at [phone] or toll free [phone].  After clinic hours [phone] to have operator reach doctor.  Bring all of your medication bottles to all your appointments in  the pain clinic.  To cancel or reschedule your appointment with Pain Management please remember to call 24 hours in advance to avoid a fee.  Refer to the educational materials which you have been given on: General Risks, I had my Procedure. Discharge Instructions, Post Sedation.  Post Procedure Instructions:  The drugs you were given will stay in your system until tomorrow, so for the next 24 hours you should not drive, make any legal decisions or drink any alcoholic beverages.  You may eat anything you prefer, but it is better to start with liquids then soups and crackers, and gradually work up to solid foods.  Please notify your doctor immediately if you have any unusual bleeding, trouble breathing or pain that is not related to your normal pain.  Depending on the type of procedure that was done, some parts of your body may feel week and/or numb.  This usually clears up by tonight or the next day.  Walk with the use of an assistive device or accompanied by an adult for the 24 hours.  You may use ice on the affected area for the first 24 hours.  Put ice in a Ziploc bag and cover with a towel and place against area 15 minutes on 15 minutes off.  You may switch to heat after 24 hours.

## 2015-03-09 NOTE — Telephone Encounter (Signed)
No problems post procedure. 

## 2015-03-31 ENCOUNTER — Telehealth: Payer: Self-pay

## 2015-03-31 NOTE — Telephone Encounter (Signed)
Pt called he said that he was in a lot pain and needed pain meds

## 2015-04-04 ENCOUNTER — Ambulatory Visit: Payer: Medicare HMO | Admitting: Pain Medicine

## 2015-04-20 ENCOUNTER — Encounter: Payer: Self-pay | Admitting: Pain Medicine

## 2015-04-20 ENCOUNTER — Ambulatory Visit: Payer: Medicare HMO | Attending: Pain Medicine | Admitting: Pain Medicine

## 2015-04-20 VITALS — BP 123/78 | HR 101 | Temp 98.0°F | Resp 18 | Ht 67.0 in | Wt 180.0 lb

## 2015-04-20 DIAGNOSIS — M5416 Radiculopathy, lumbar region: Secondary | ICD-10-CM

## 2015-04-20 DIAGNOSIS — M47812 Spondylosis without myelopathy or radiculopathy, cervical region: Secondary | ICD-10-CM

## 2015-04-20 DIAGNOSIS — M47816 Spondylosis without myelopathy or radiculopathy, lumbar region: Secondary | ICD-10-CM

## 2015-04-20 DIAGNOSIS — M4806 Spinal stenosis, lumbar region: Secondary | ICD-10-CM | POA: Diagnosis not present

## 2015-04-20 DIAGNOSIS — M5126 Other intervertebral disc displacement, lumbar region: Secondary | ICD-10-CM | POA: Diagnosis not present

## 2015-04-20 DIAGNOSIS — M5030 Other cervical disc degeneration, unspecified cervical region: Secondary | ICD-10-CM

## 2015-04-20 DIAGNOSIS — M542 Cervicalgia: Secondary | ICD-10-CM | POA: Diagnosis present

## 2015-04-20 DIAGNOSIS — M4802 Spinal stenosis, cervical region: Secondary | ICD-10-CM | POA: Insufficient documentation

## 2015-04-20 DIAGNOSIS — M5412 Radiculopathy, cervical region: Secondary | ICD-10-CM | POA: Insufficient documentation

## 2015-04-20 DIAGNOSIS — M51369 Other intervertebral disc degeneration, lumbar region without mention of lumbar back pain or lower extremity pain: Secondary | ICD-10-CM

## 2015-04-20 DIAGNOSIS — M5136 Other intervertebral disc degeneration, lumbar region: Secondary | ICD-10-CM

## 2015-04-20 DIAGNOSIS — M545 Low back pain: Secondary | ICD-10-CM | POA: Diagnosis present

## 2015-04-20 DIAGNOSIS — M5481 Occipital neuralgia: Secondary | ICD-10-CM

## 2015-04-20 MED ORDER — PREGABALIN 25 MG PO CAPS: ORAL_CAPSULE | ORAL | Status: DC

## 2015-04-20 MED ORDER — OXYCODONE HCL 5 MG PO TABS: ORAL_TABLET | ORAL | Status: DC

## 2015-04-20 NOTE — Patient Instructions (Addendum)
PLAN   Continue present medications AND STOP NEURONTIN (GABAPENTIN) since you are not taking this medication at this time. Begin oxycodone and Lyrica as prescribed. Caution oxycodone and Lyrica can cause respiratory depression excessive sedation confusion and other side effects DO NOT TAKE GABAPENTIN (NEURONTIN) IF YOU TAKE LYRICA  Lumbar facet, medial branch nerve blocks to be performed at time return appointment  F/U PCP C Doss for evaliation of  BP and general medical  condition  F/U surgical evaluation as planned. Please ask staff date of your neurosurgical evaluation  F/U neurological evaluation. May consider pending follow-up evaluations  May consider radiofrequency rhizolysis or intraspinal procedures pending response to present treatment and F/U evaluation GENERAL RISKS AND COMPLICATIONS  What are the risk, side effects and possible complications? Generally speaking, most procedures are safe.  However, with any procedure there are risks, side effects, and the possibility of complications.  The risks and complications are dependent upon the sites that are lesioned, or the type of nerve block to be performed.  The closer the procedure is to the spine, the more serious the risks are.  Great care is taken when placing the radio frequency needles, block needles or lesioning probes, but sometimes complications can occur. 1. Infection: Any time there is an injection through the skin, there is a risk of infection.  This is why sterile conditions are used for these blocks.  There are four possible types of infection. 1. Localized skin infection. 2. Central Nervous System Infection-This can be in the form of Meningitis, which can be deadly. 3. Epidural Infections-This can be in the form of an epidural abscess, which can cause pressure inside of the spine, causing compression of the spinal cord with subsequent paralysis. This would require an emergency surgery to decompress, and there are no  guarantees that the patient would recover from the paralysis. 4. Discitis-This is an infection of the intervertebral discs.  It occurs in about 1% of discography procedures.  It is difficult to treat and it may lead to surgery.        2. Pain: the needles have to go through skin and soft tissues, will cause soreness.       3. Damage to internal structures:  The nerves to be lesioned may be near blood vessels or    other nerves which can be potentially damaged.       4. Bleeding: Bleeding is more common if the patient is taking blood thinners such as  aspirin, Coumadin, Ticiid, Plavix, etc., or if he/she have some genetic predisposition  such as hemophilia. Bleeding into the spinal canal can cause compression of the spinal  cord with subsequent paralysis.  This would require an emergency surgery to  decompress and there are no guarantees that the patient would recover from the  paralysis.       5. Pneumothorax:  Puncturing of a lung is a possibility, every time a needle is introduced in  the area of the chest or upper back.  Pneumothorax refers to free air around the  collapsed lung(s), inside of the thoracic cavity (chest cavity).  Another two possible  complications related to a similar event would include: Hemothorax and Chylothorax.   These are variations of the Pneumothorax, where instead of air around the collapsed  lung(s), you may have blood or chyle, respectively.       6. Spinal headaches: They may occur with any procedures in the area of the spine.       7. Persistent  CSF (Cerebro-Spinal Fluid) leakage: This is a rare problem, but may occur  with prolonged intrathecal or epidural catheters either due to the formation of a fistulous  track or a dural tear.       8. Nerve damage: By working so close to the spinal cord, there is always a possibility of  nerve damage, which could be as serious as a permanent spinal cord injury with  paralysis.       9. Death:  Although rare, severe deadly allergic  reactions known as "Anaphylactic  reaction" can occur to any of the medications used.      10. Worsening of the symptoms:  We can always make thing worse.  What are the chances of something like this happening? Chances of any of this occuring are extremely low.  By statistics, you have more of a chance of getting killed in a motor vehicle accident: while driving to the hospital than any of the above occurring .  Nevertheless, you should be aware that they are possibilities.  In general, it is similar to taking a shower.  Everybody knows that you can slip, hit your head and get killed.  Does that mean that you should not shower again?  Nevertheless always keep in mind that statistics do not mean anything if you happen to be on the wrong side of them.  Even if a procedure has a 1 (one) in a 1,000,000 (million) chance of going wrong, it you happen to be that one..Also, keep in mind that by statistics, you have more of a chance of having something go wrong when taking medications.  Who should not have this procedure? If you are on a blood thinning medication (e.g. Coumadin, Plavix, see list of "Blood Thinners"), or if you have an active infection going on, you should not have the procedure.  If you are taking any blood thinners, please inform your physician.  How should I prepare for this procedure?  Do not eat or drink anything at least six hours prior to the procedure.  Bring a driver with you .  It cannot be a taxi.  Come accompanied by an adult that can drive you back, and that is strong enough to help you if your legs get weak or numb from the local anesthetic.  Take all of your medicines the morning of the procedure with just enough water to swallow them.  If you have diabetes, make sure that you are scheduled to have your procedure done first thing in the morning, whenever possible.  If you have diabetes, take only half of your insulin dose and notify our nurse that you have done so as soon  as you arrive at the clinic.  If you are diabetic, but only take blood sugar pills (oral hypoglycemic), then do not take them on the morning of your procedure.  You may take them after you have had the procedure.  Do not take aspirin or any aspirin-containing medications, at least eleven (11) days prior to the procedure.  They may prolong bleeding.  Wear loose fitting clothing that may be easy to take off and that you would not mind if it got stained with Betadine or blood.  Do not wear any jewelry or perfume  Remove any nail coloring.  It will interfere with some of our monitoring equipment.  NOTE: Remember that this is not meant to be interpreted as a complete list of all possible complications.  Unforeseen problems may occur.  BLOOD THINNERS The following  drugs contain aspirin or other products, which can cause increased bleeding during surgery and should not be taken for 2 weeks prior to and 1 week after surgery.  If you should need take something for relief of minor pain, you may take acetaminophen which is found in Tylenol,m Datril, Anacin-3 and Panadol. It is not blood thinner. The products listed below are.  Do not take any of the products listed below in addition to any listed on your instruction sheet.  A.P.C or A.P.C with Codeine Codeine Phosphate Capsules #3 Ibuprofen Ridaura  ABC compound Congesprin Imuran rimadil  Advil Cope Indocin Robaxisal  Alka-Seltzer Effervescent Pain Reliever and Antacid Coricidin or Coricidin-D  Indomethacin Rufen  Alka-Seltzer plus Cold Medicine Cosprin Ketoprofen S-A-C Tablets  Anacin Analgesic Tablets or Capsules Coumadin Korlgesic Salflex  Anacin Extra Strength Analgesic tablets or capsules CP-2 Tablets Lanoril Salicylate  Anaprox Cuprimine Capsules Levenox Salocol  Anexsia-D Dalteparin Magan Salsalate  Anodynos Darvon compound Magnesium Salicylate Sine-off  Ansaid Dasin Capsules Magsal Sodium Salicylate  Anturane Depen Capsules Marnal Soma   APF Arthritis pain formula Dewitt's Pills Measurin Stanback  Argesic Dia-Gesic Meclofenamic Sulfinpyrazone  Arthritis Bayer Timed Release Aspirin Diclofenac Meclomen Sulindac  Arthritis pain formula Anacin Dicumarol Medipren Supac  Analgesic (Safety coated) Arthralgen Diffunasal Mefanamic Suprofen  Arthritis Strength Bufferin Dihydrocodeine Mepro Compound Suprol  Arthropan liquid Dopirydamole Methcarbomol with Aspirin Synalgos  ASA tablets/Enseals Disalcid Micrainin Tagament  Ascriptin Doan's Midol Talwin  Ascriptin A/D Dolene Mobidin Tanderil  Ascriptin Extra Strength Dolobid Moblgesic Ticlid  Ascriptin with Codeine Doloprin or Doloprin with Codeine Momentum Tolectin  Asperbuf Duoprin Mono-gesic Trendar  Aspergum Duradyne Motrin or Motrin IB Triminicin  Aspirin plain, buffered or enteric coated Durasal Myochrisine Trigesic  Aspirin Suppositories Easprin Nalfon Trillsate  Aspirin with Codeine Ecotrin Regular or Extra Strength Naprosyn Uracel  Atromid-S Efficin Naproxen Ursinus  Auranofin Capsules Elmiron Neocylate Vanquish  Axotal Emagrin Norgesic Verin  Azathioprine Empirin or Empirin with Codeine Normiflo Vitamin E  Azolid Emprazil Nuprin Voltaren  Bayer Aspirin plain, buffered or children's or timed BC Tablets or powders Encaprin Orgaran Warfarin Sodium  Buff-a-Comp Enoxaparin Orudis Zorpin  Buff-a-Comp with Codeine Equegesic Os-Cal-Gesic   Buffaprin Excedrin plain, buffered or Extra Strength Oxalid   Bufferin Arthritis Strength Feldene Oxphenbutazone   Bufferin plain or Extra Strength Feldene Capsules Oxycodone with Aspirin   Bufferin with Codeine Fenoprofen Fenoprofen Pabalate or Pabalate-SF   Buffets II Flogesic Panagesic   Buffinol plain or Extra Strength Florinal or Florinal with Codeine Panwarfarin   Buf-Tabs Flurbiprofen Penicillamine   Butalbital Compound Four-way cold tablets Penicillin   Butazolidin Fragmin Pepto-Bismol   Carbenicillin Geminisyn Percodan   Carna  Arthritis Reliever Geopen Persantine   Carprofen Gold's salt Persistin   Chloramphenicol Goody's Phenylbutazone   Chloromycetin Haltrain Piroxlcam   Clmetidine heparin Plaquenil   Cllnoril Hyco-pap Ponstel   Clofibrate Hydroxy chloroquine Propoxyphen         Before stopping any of these medications, be sure to consult the physician who ordered them.  Some, such as Coumadin (Warfarin) are ordered to prevent or treat serious conditions such as "deep thrombosis", "pumonary embolisms", and other heart problems.  The amount of time that you may need off of the medication may also vary with the medication and the reason for which you were taking it.  If you are taking any of these medications, please make sure you notify your pain physician before you undergo any procedures.         Facet Blocks Patient Information  Description: The facets are joints in the spine between the vertebrae.  Like any joints in the body, facets can become irritated and painful.  Arthritis can also effect the facets.  By injecting steroids and local anesthetic in and around these joints, we can temporarily block the nerve supply to them.  Steroids act directly on irritated nerves and tissues to reduce selling and inflammation which often leads to decreased pain.  Facet blocks may be done anywhere along the spine from the neck to the low back depending upon the location of your pain.   After numbing the skin with local anesthetic (like Novocaine), a small needle is passed onto the facet joints under x-ray guidance.  You may experience a sensation of pressure while this is being done.  The entire block usually lasts about 15-25 minutes.   Conditions which may be treated by facet blocks:   Low back/buttock pain  Neck/shoulder pain  Certain types of headaches  Preparation for the injection:  1. Do not eat any solid food or dairy products within 6 hours of your appointment. 2. You may drink clear liquid up to 2  hours before appointment.  Clear liquids include water, black coffee, juice or soda.  No milk or cream please. 3. You may take your regular medication, including pain medications, with a sip of water before your appointment.  Diabetics should hold regular insulin (if taken separately) and take 1/2 normal NPH dose the morning of the procedure.  Carry some sugar containing items with you to your appointment. 4. A driver must accompany you and be prepared to drive you home after your procedure. 5. Bring all your current medications with you. 6. An IV may be inserted and sedation may be given at the discretion of the physician. 7. A blood pressure cuff, EKG and other monitors will often be applied during the procedure.  Some patients may need to have extra oxygen administered for a short period. 8. You will be asked to provide medical information, including your allergies and medications, prior to the procedure.  We must know immediately if you are taking blood thinners (like Coumadin/Warfarin) or if you are allergic to IV iodine contrast (dye).  We must know if you could possible be pregnant.  Possible side-effects:   Bleeding from needle site  Infection (rare, may require surgery)  Nerve injury (rare)  Numbness & tingling (temporary)  Difficulty urinating (rare, temporary)  Spinal headache (a headache worse with upright posture)  Light-headedness (temporary)  Pain at injection site (serveral days)  Decreased blood pressure (rare, temporary)  Weakness in arm/leg (temporary)  Pressure sensation in back/neck (temporary)   Call if you experience:   Fever/chills associated with headache or increased back/neck pain  Headache worsened by an upright position  New onset, weakness or numbness of an extremity below the injection site  Hives or difficulty breathing (go to the emergency room)  Inflammation or drainage at the injection site(s)  Severe back/neck pain greater than  usual  New symptoms which are concerning to you  Please note:  Although the local anesthetic injected can often make your back or neck feel good for several hours after the injection, the pain will likely return. It takes 3-7 days for steroids to work.  You may not notice any pain relief for at least one week.  If effective, we will often do a series of 2-3 injections spaced 3-6 weeks apart to maximally decrease your pain.  After the initial series, you may be  a candidate for a more permanent nerve block of the facets.  If you have any questions, please call #[phone]) [facility]

## 2015-04-20 NOTE — Progress Notes (Signed)
Subjective:    Patient ID: Kyle [name], male    DOB: 16-Oct-1960, 54 y.o.   MRN: [id number]  HPI  The patient is a 54 year old gentleman who returns to pain management Center for further evaluation and treatment of pain involving the neck upper extremity regions upper mid lower back and lower extremity regions. Patient states that his had improvement with prior interventional treatment performed in the cervical region. Patient states he continues to have pain involving the lower back lower extremity region which is aggravated by standing walking twisting turning maneuvers. Patient denies any recent trauma change in events of daily living the call significant change in symptomatology.. The patient states that the pain of the lower back region becomes more intense as the day progresses of pain it appears patient's ability to turning over in bed. Patient states that he is without plans for surgical intervention at this time. We discussed patient's condition and will proceed with scheduling patient for lumbar facet, medial branch nerve blocks to be performed at time return appointment. We will also prescribed medications Lyrica and hydrocodone acetaminophen palpation. The patient states he has taken Neurontin previously which he stated had some undesirable side effects and made him feel a little sedated. We've caution patient regarding Lyrica and will resume patient's oxycodone as well as this time. We will proceed with lumbar facet, medial branch nerve, blocks at time return appointment in attempt to decrease severity of patient's symptoms, minimize progression of patient's symptoms, and avoid need for more involved treatment. The patient was understanding and agreed to suggested treatment plan        Review of Systems     Objective:   Physical Exam  There was tenderness to palpation over the region of the splenius capitis and occipitalis musculature regions. There was mild tenderness to  palpation of the acromioclavicular and glenohumeral joint regions. Patient appeared to be with slightly decreased grip strength. Tinel and Phalen's maneuver were without increased pain of significant degree. Palpation over the cervical paraspinal must reason cervical facet region as well as the thoracic paraspinal musculature region thoracic facet region was with mild to moderate discomfort. His tends to palpation over the thoracic spinal musculature region though crepitus of the thoracic region noted. There appeared to be moderate muscle spasms of the lower thoracic paraspinal musculature region. Palpation over the lumbar paraspinal musculatures and lumbar facet region was associated with moderate severe tenderness to palpation with lateral bending rotation extension and palpation reproducing moderately severe discomfort on the left as well as on the right. There was tenderness of the gluteal and piriformis musculature regions as well. There was increase of pain with pressure applied to the ileum with patient in lateral decubitus position as well as with palpation over the PSIS and PII S regions. Straight leg raise was tolerates approximately 30 without increase of pain with dorsiflexion noted. DTRs were difficult to elicit EHL strength appeared to be decreased without a definite sensory deficit or dermatomal distribution detected. There appeared to be negative clonus negative Homans. Abdomen was nontender with no costovertebral tenderness noted.      Assessment & Plan:    1. 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 Is also mild spinal stenosis at C4-5. 2. Bulging discs and mild facet disease in the lumbar spine but no significant disc protrusions, spinal or foraminal stenosis  Cervical facet syndrome  Cervical radiculopathy  MRI  of the lumbar region demonstrates bulging disks with facet degenerative changes  Lumbar facet  syndrome  PLAN   Continue present medications  AND STOP NEURONTIN (GABAPENTIN) since you are not taking this medication at this time. Begin oxycodone and Lyrica as prescribed. Caution oxycodone and Lyrica can cause respiratory depression excessive sedation confusion and other side effects DO NOT TAKE GABAPENTIN (NEURONTIN) IF YOU TAKE LYRICA  Lumbar facet, medial branch nerve blocks to be performed at time return appointment  F/U PCP C Doss for evaliation of  BP and general medical  condition  F/U surgical evaluation as planned. Please ask staff date of your neurosurgical evaluation  F/U neurological evaluation. May consider pending follow-up evaluations  May consider radiofrequency rhizolysis or intraspinal procedures pending response to present treatment and F/U evaluation

## 2015-04-20 NOTE — Progress Notes (Signed)
Safety precautions to be maintained throughout the outpatient stay will include: orient to surroundings, keep bed in low position, maintain call bell within reach at all times, provide assistance with transfer out of bed and ambulation.  

## 2015-05-03 ENCOUNTER — Other Ambulatory Visit: Payer: Self-pay | Admitting: Pain Medicine

## 2015-05-03 DIAGNOSIS — M51369 Other intervertebral disc degeneration, lumbar region without mention of lumbar back pain or lower extremity pain: Secondary | ICD-10-CM

## 2015-05-03 DIAGNOSIS — M5136 Other intervertebral disc degeneration, lumbar region: Secondary | ICD-10-CM

## 2015-05-03 DIAGNOSIS — M47816 Spondylosis without myelopathy or radiculopathy, lumbar region: Secondary | ICD-10-CM

## 2015-05-03 DIAGNOSIS — M47812 Spondylosis without myelopathy or radiculopathy, cervical region: Secondary | ICD-10-CM

## 2015-05-03 DIAGNOSIS — M5030 Other cervical disc degeneration, unspecified cervical region: Secondary | ICD-10-CM

## 2015-05-06 ENCOUNTER — Other Ambulatory Visit: Payer: Self-pay | Admitting: Pain Medicine

## 2015-05-08 ENCOUNTER — Encounter: Payer: Self-pay | Admitting: Pain Medicine

## 2015-05-08 ENCOUNTER — Ambulatory Visit: Payer: Medicare HMO | Attending: Pain Medicine | Admitting: Pain Medicine

## 2015-05-08 VITALS — BP 136/93 | HR 84 | Temp 97.9°F | Resp 16 | Ht 67.0 in | Wt 170.0 lb

## 2015-05-08 DIAGNOSIS — M5416 Radiculopathy, lumbar region: Secondary | ICD-10-CM

## 2015-05-08 DIAGNOSIS — M5030 Other cervical disc degeneration, unspecified cervical region: Secondary | ICD-10-CM

## 2015-05-08 DIAGNOSIS — M47816 Spondylosis without myelopathy or radiculopathy, lumbar region: Secondary | ICD-10-CM

## 2015-05-08 DIAGNOSIS — M5481 Occipital neuralgia: Secondary | ICD-10-CM

## 2015-05-08 DIAGNOSIS — M5116 Intervertebral disc disorders with radiculopathy, lumbar region: Secondary | ICD-10-CM | POA: Diagnosis not present

## 2015-05-08 DIAGNOSIS — M5136 Other intervertebral disc degeneration, lumbar region: Secondary | ICD-10-CM | POA: Diagnosis present

## 2015-05-08 DIAGNOSIS — M47812 Spondylosis without myelopathy or radiculopathy, cervical region: Secondary | ICD-10-CM

## 2015-05-08 MED ORDER — CIPROFLOXACIN HCL 250 MG PO TABS: 250.0000 mg | ORAL_TABLET | Freq: Two times a day (BID) | ORAL | Status: DC

## 2015-05-08 MED ORDER — FENTANYL CITRATE (PF) 100 MCG/2ML IJ SOLN: 100.0000 ug | Freq: Once | INTRAMUSCULAR | Status: DC

## 2015-05-08 MED ORDER — LACTATED RINGERS IV SOLN: 1000.0000 mL | INTRAVENOUS | Status: DC

## 2015-05-08 MED ORDER — MIDAZOLAM HCL 5 MG/5ML IJ SOLN: 5.0000 mg | Freq: Once | INTRAMUSCULAR | Status: DC

## 2015-05-08 MED ORDER — TRIAMCINOLONE ACETONIDE 40 MG/ML IJ SUSP
INTRAMUSCULAR | Status: AC
  Filled 2015-05-08: qty 1

## 2015-05-08 MED ORDER — ORPHENADRINE CITRATE 30 MG/ML IJ SOLN
INTRAMUSCULAR | Status: AC
  Filled 2015-05-08: qty 2

## 2015-05-08 MED ORDER — TRIAMCINOLONE ACETONIDE 40 MG/ML IJ SUSP: 40.0000 mg | Freq: Once | INTRAMUSCULAR | Status: DC

## 2015-05-08 MED ORDER — FENTANYL CITRATE (PF) 100 MCG/2ML IJ SOLN
INTRAMUSCULAR | Status: AC
  Filled 2015-05-08: qty 2

## 2015-05-08 MED ORDER — BUPIVACAINE HCL (PF) 0.25 % IJ SOLN: 30.0000 mL | Freq: Once | INTRAMUSCULAR | Status: DC

## 2015-05-08 MED ORDER — BUPIVACAINE HCL (PF) 0.25 % IJ SOLN
INTRAMUSCULAR | Status: AC
  Filled 2015-05-08: qty 30

## 2015-05-08 MED ORDER — ORPHENADRINE CITRATE 30 MG/ML IJ SOLN: 60.0000 mg | Freq: Once | INTRAMUSCULAR | Status: DC

## 2015-05-08 MED ORDER — MIDAZOLAM HCL 5 MG/5ML IJ SOLN
INTRAMUSCULAR | Status: AC
  Filled 2015-05-08: qty 5

## 2015-05-08 MED ORDER — CIPROFLOXACIN IN D5W 400 MG/200ML IV SOLN: 400.0000 mg | Freq: Once | INTRAVENOUS | Status: DC

## 2015-05-08 NOTE — Progress Notes (Signed)
   Subjective:    Patient ID: Kyle [name], male    DOB: 12/24/1960, 55 y.o.   MRN: [id number]  HPI  Due to staff not having patient approval for his procedure (lumbar facet, medial branch nerve blocks) this patient could have procedure performed today.  The patient and I continue to wait for patient to be approved by insurance company for patient to have his procedure. We will proceed with procedure once insurance company approves patient for procedure  (Lumbar facet, medial branch nerve, blocks)   Review of Systems     Objective:   Physical Exam        Assessment & Plan:

## 2015-05-08 NOTE — Patient Instructions (Signed)
PLAN   Continue present medications and please obtain your Cipro antibiotic today and begin taking Cipro antibiotic today as prescribed  F/U PCP C Doss for evaliation of  BP and general medical  condition  F/U surgical evaluation as planned.   F/U neurological evaluation. May consider pending follow-up evaluations  May consider radiofrequency rhizolysis or intraspinal procedures pending response to present treatment and F/U evaluation   Patient to call Pain Management Center should patient have concerns prior to scheduled return appointment.

## 2015-05-08 NOTE — Progress Notes (Signed)
Safety precautions to be maintained throughout the outpatient stay will include: orient to surroundings, keep bed in low position, maintain call bell within reach at all times, provide assistance with transfer out of bed and ambulation.  

## 2015-05-09 ENCOUNTER — Telehealth: Payer: Self-pay

## 2015-05-09 NOTE — Telephone Encounter (Signed)
Procedure call back.... Pt did not have procedure.  Is expecting a phone call to see when he could have procedure.

## 2015-05-22 ENCOUNTER — Encounter: Payer: Self-pay | Admitting: Pain Medicine

## 2015-05-22 ENCOUNTER — Ambulatory Visit: Payer: Medicare HMO | Attending: Pain Medicine | Admitting: Pain Medicine

## 2015-05-22 VITALS — BP 151/94 | HR 65 | Temp 97.8°F | Resp 17 | Ht 67.0 in | Wt 165.0 lb

## 2015-05-22 DIAGNOSIS — M79606 Pain in leg, unspecified: Secondary | ICD-10-CM | POA: Diagnosis present

## 2015-05-22 DIAGNOSIS — M47816 Spondylosis without myelopathy or radiculopathy, lumbar region: Secondary | ICD-10-CM

## 2015-05-22 DIAGNOSIS — M5481 Occipital neuralgia: Secondary | ICD-10-CM

## 2015-05-22 DIAGNOSIS — M545 Low back pain: Secondary | ICD-10-CM | POA: Insufficient documentation

## 2015-05-22 DIAGNOSIS — M5126 Other intervertebral disc displacement, lumbar region: Secondary | ICD-10-CM | POA: Insufficient documentation

## 2015-05-22 DIAGNOSIS — M5416 Radiculopathy, lumbar region: Secondary | ICD-10-CM

## 2015-05-22 DIAGNOSIS — M47812 Spondylosis without myelopathy or radiculopathy, cervical region: Secondary | ICD-10-CM

## 2015-05-22 DIAGNOSIS — M5136 Other intervertebral disc degeneration, lumbar region: Secondary | ICD-10-CM

## 2015-05-22 DIAGNOSIS — M1288 Other specific arthropathies, not elsewhere classified, other specified site: Secondary | ICD-10-CM | POA: Insufficient documentation

## 2015-05-22 DIAGNOSIS — M5030 Other cervical disc degeneration, unspecified cervical region: Secondary | ICD-10-CM

## 2015-05-22 MED ORDER — MIDAZOLAM HCL 5 MG/5ML IJ SOLN
INTRAMUSCULAR | Status: AC
  Administered 2015-05-22: 3 mg via INTRAVENOUS
  Filled 2015-05-22: qty 5

## 2015-05-22 MED ORDER — CIPROFLOXACIN HCL 250 MG PO TABS
250.0000 mg | ORAL_TABLET | Freq: Two times a day (BID) | ORAL | Status: DC
  Filled 2015-05-22: qty 1

## 2015-05-22 MED ORDER — PREGABALIN 25 MG PO CAPS: ORAL_CAPSULE | ORAL | Status: DC

## 2015-05-22 MED ORDER — FENTANYL CITRATE (PF) 100 MCG/2ML IJ SOLN: 100.0000 ug | Freq: Once | INTRAMUSCULAR | Status: DC

## 2015-05-22 MED ORDER — OXYCODONE HCL 5 MG PO TABS: ORAL_TABLET | ORAL | Status: DC

## 2015-05-22 MED ORDER — ORPHENADRINE CITRATE 30 MG/ML IJ SOLN: 60.0000 mg | Freq: Once | INTRAMUSCULAR | Status: DC

## 2015-05-22 MED ORDER — MIDAZOLAM HCL 5 MG/5ML IJ SOLN
5.0000 mg | Freq: Once | INTRAMUSCULAR | Status: AC
  Administered 2015-05-22: 3 mg via INTRAVENOUS

## 2015-05-22 MED ORDER — ORPHENADRINE CITRATE 30 MG/ML IJ SOLN
INTRAMUSCULAR | Status: AC
  Administered 2015-05-22: 12:00:00
  Filled 2015-05-22: qty 2

## 2015-05-22 MED ORDER — LACTATED RINGERS IV SOLN: 1000.0000 mL | INTRAVENOUS | Status: DC

## 2015-05-22 MED ORDER — CIPROFLOXACIN IN D5W 400 MG/200ML IV SOLN: 400.0000 mg | Freq: Once | INTRAVENOUS | Status: DC

## 2015-05-22 MED ORDER — TRIAMCINOLONE ACETONIDE 40 MG/ML IJ SUSP: 40.0000 mg | Freq: Once | INTRAMUSCULAR | Status: DC

## 2015-05-22 MED ORDER — BUPIVACAINE HCL (PF) 0.25 % IJ SOLN: 30.0000 mL | Freq: Once | INTRAMUSCULAR | Status: DC

## 2015-05-22 MED ORDER — TRIAMCINOLONE ACETONIDE 40 MG/ML IJ SUSP
INTRAMUSCULAR | Status: AC
  Administered 2015-05-22: 12:00:00
  Filled 2015-05-22: qty 1

## 2015-05-22 MED ORDER — FENTANYL CITRATE (PF) 100 MCG/2ML IJ SOLN
INTRAMUSCULAR | Status: AC
  Administered 2015-05-22: 100 ug via INTRAVENOUS
  Filled 2015-05-22: qty 2

## 2015-05-22 MED ORDER — BUPIVACAINE HCL (PF) 0.25 % IJ SOLN
INTRAMUSCULAR | Status: AC
  Administered 2015-05-22: 12:00:00
  Filled 2015-05-22: qty 30

## 2015-05-22 MED ORDER — CIPROFLOXACIN IN D5W 400 MG/200ML IV SOLN
INTRAVENOUS | Status: AC
  Administered 2015-05-22: 12:00:00
  Filled 2015-05-22: qty 200

## 2015-05-22 NOTE — Progress Notes (Signed)
Subjective:    Patient ID: Kyle [name], male    DOB: 1960-08-04, 55 y.o.   MRN: [id number]  HPI  PROCEDURE PERFORMED: Lumbar facet (medial branch block)   NOTE: The patient is a 55 y.o. male who returns to [facility] for further evaluation and treatment of pain involving the lumbar and lower extremity region. MRI revealed bulging disc with facet degenerative changes at multiple levels. 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 We will proceed with interventional treatment as discussed and as explained to the patient who was understanding and wished to proceed with procedure as planned.   DESCRIPTION OF PROCEDURE: Lumbar facet (medial branch block) with IV Versed, IV fentanyl conscious sedation, EKG, blood pressure, pulse, and pulse oximetry monitoring. The procedure was performed with the patient in the prone position. Betadine prep of proposed entry site performed.   NEEDLE PLACEMENT AT: Left L 2 lumbar facet (medial branch block). Under fluoroscopic guidance with oblique orientation of 15 degrees, a 22-gauge needle was inserted at the L 2 vertebral body level with needle placed at the targeted area of Burton's Eye or Eye of the Scotty Dog with documentation of needle placement in the superior and lateral border of targeted area of Burton's Eye or Eye of the Scotty Dog with oblique orientation of 15 degrees. Following documentation of needle placement at the L 2 vertebral body level, needle placement was then accomplished at the L 3 vertebral body level.   NEEDLE PLACEMENT AT L3, L4, and L5 VERTEBRAL BODY LEVELS ON THE LEFT SIDE The procedure was performed at the L3, L4, and L5 vertebral body levels exactly as was performed at the L  2 vertebral body level utilizing the same technique and under fluoroscopic guidance.  NEEDLE PLACEMENT AT THE SACRAL ALA with AP view of the lumbosacral spine. With the patient in the prone position, Betadine prep of proposed entry site accomplished, a 22 gauge needle was inserted in the region of the sacral ala (groove formed by the superior articulating process of S1 and the sacral wing). Following documentation of needle placement at the sacral ala, ollowing documentation of needle placement at the S1 foramen.   Needle placement was then verified at all levels on lateral view. Following documentation of needle placement at all levels on lateral view and following negative aspiration for heme and CSF, each level was injected with 1 mL of 0.25% bupivacaine with Kenalog.     LUMBAR FACET, MEDIAL BRANCH NERVE, BLOCKS PERFORMED ON THE RIGHT SIDE   The procedure was performed on the right side exactly as was performed on the left side at the same levels and utilizing the same technique under fluoroscopic guidance.     The patient tolerated the procedure well. A total of 40 mg of Kenalog was utilized for the procedure.   PLAN:  1. Medications: The patient will continue presently prescribed medications Lyrica and oxycodone 2. May consider modification of treatment regimen at time of return appointment pending response to treatment rendered on today's visit. 3. The patient is to follow-up with primary care physician for further evaluation of blood pressure and general medical condition status post steroid injection performed on today's visit. 4. Surgical follow-up evaluation. Has been addressed 5. Neurological follow-up evaluation. May consider [facility] EMG studies  and other studies The patient may be candidate for radiofrequency procedures, implantation type procedures, and other treatment pending response to treatment and follow-up evaluation. 6. The patient has been advised to call the Pain  Management Center prior to scheduled return appointment should there be significant change in condition or should patient have other concerns regarding condition prior to scheduled return appointment.  The patient is understanding and in agreement with suggested treatment plan.   Review of Systems     Objective:   Physical Exam        Assessment & Plan:

## 2015-05-22 NOTE — Patient Instructions (Addendum)
PLAN   Continue present medications and please obtain your Cipro antibiotic today and begin taking Cipro antibiotic today as prescribed  F/U PCP C Doss for evaliation of  BP and general medical  condition  F/U surgical evaluation as planned.   F/U neurological evaluation. May consider pending follow-up evaluations  May consider radiofrequency rhizolysis or intraspinal procedures pending response to present treatment and F/U evaluation   Patient to call Pain Management Center should patient have concerns prior to scheduled return appointment.Facet Blocks Patient Information  Description: The facets are joints in the spine between the vertebrae.  Like any joints in the body, facets can become irritated and painful.  Arthritis can also effect the facets.  By injecting steroids and local anesthetic in and around these joints, we can temporarily block the nerve supply to them.  Steroids act directly on irritated nerves and tissues to reduce selling and inflammation which often leads to decreased pain.  Facet blocks may be done anywhere along the spine from the neck to the low back depending upon the location of your pain.   After numbing the skin with local anesthetic (like Novocaine), a small needle is passed onto the facet joints under x-ray guidance.  You may experience a sensation of pressure while this is being done.  The entire block usually lasts about 15-25 minutes.   Conditions which may be treated by facet blocks:   Low back/buttock pain  Neck/shoulder pain  Certain types of headaches  Preparation for the injection:  1. Do not eat any solid food or dairy products within 6 hours of your appointment. 2. You may drink clear liquid up to 2 hours before appointment.  Clear liquids include water, black coffee, juice or soda.  No milk or cream please. 3. You may take your regular medication, including pain medications, with a sip of water before your appointment.  Diabetics should hold  regular insulin (if taken separately) and take 1/2 normal NPH dose the morning of the procedure.  Carry some sugar containing items with you to your appointment. 4. A driver must accompany you and be prepared to drive you home after your procedure. 5. Bring all your current medications with you. 6. An IV may be inserted and sedation may be given at the discretion of the physician. 7. A blood pressure cuff, EKG and other monitors will often be applied during the procedure.  Some patients may need to have extra oxygen administered for a short period. 8. You will be asked to provide medical information, including your allergies and medications, prior to the procedure.  We must know immediately if you are taking blood thinners (like Coumadin/Warfarin) or if you are allergic to IV iodine contrast (dye).  We must know if you could possible be pregnant.  Possible side-effects:   Bleeding from needle site  Infection (rare, may require surgery)  Nerve injury (rare)  Numbness & tingling (temporary)  Difficulty urinating (rare, temporary)  Spinal headache (a headache worse with upright posture)  Light-headedness (temporary)  Pain at injection site (serveral days)  Decreased blood pressure (rare, temporary)  Weakness in arm/leg (temporary)  Pressure sensation in back/neck (temporary)   Call if you experience:   Fever/chills associated with headache or increased back/neck pain  Headache worsened by an upright position  New onset, weakness or numbness of an extremity below the injection site  Hives or difficulty breathing (go to the emergency room)  Inflammation or drainage at the injection site(s)  Severe back/neck pain greater than  usual  New symptoms which are concerning to you  Please note:  Although the local anesthetic injected can often make your back or neck feel good for several hours after the injection, the pain will likely return. It takes 3-7 days for steroids to  work.  You may not notice any pain relief for at least one week.  If effective, we will often do a series of 2-3 injections spaced 3-6 weeks apart to maximally decrease your pain.  After the initial series, you may be a candidate for a more permanent nerve block of the facets.  If you have any questions, please call #[phone]) [facility]  General Discharge Instructions :  If you need to reach your doctor call: Monday-Friday 8:00 am - 4:00 pm at [phone] or toll free [phone].  After clinic hours [phone] to have operator reach doctor.  Bring all of your medication bottles to all your appointments in the pain clinic.  To cancel or reschedule your appointment with Pain Management please remember to call 24 hours in advance to avoid a fee.  Refer to the educational materials which you have been given on: General Risks, I had my Procedure. Discharge Instructions, Post Sedation.  Post Procedure Instructions:  The drugs you were given will stay in your system until tomorrow, so for the next 24 hours you should not drive, make any legal decisions or drink any alcoholic beverages.  You may eat anything you prefer, but it is better to start with liquids then soups and crackers, and gradually work up to solid foods.  Please notify your doctor immediately if you have any unusual bleeding, trouble breathing or pain that is not related to your normal pain.  Depending on the type of procedure that was done, some parts of your body may feel week and/or numb.  This usually clears up by tonight or the next day.  Walk with the use of an assistive device or accompanied by an adult for the 24 hours.  You may use ice on the affected area for the first 24 hours.  Put ice in a Ziploc bag and cover with a towel and place against area 15 minutes on 15 minutes off.  You may switch to heat after 24 hours.

## 2015-05-23 ENCOUNTER — Telehealth: Payer: Self-pay | Admitting: *Deleted

## 2015-05-23 NOTE — Telephone Encounter (Signed)
No problems post procedure. 

## 2015-06-05 ENCOUNTER — Other Ambulatory Visit: Payer: Self-pay | Admitting: Pain Medicine

## 2015-06-13 ENCOUNTER — Emergency Department
Admission: EM | Admit: 2015-06-13 | Discharge: 2015-06-13 | Disposition: A | Discharge status: Home / Self Care | Payer: Medicare HMO | Attending: Emergency Medicine | Admitting: Emergency Medicine

## 2015-06-13 ENCOUNTER — Encounter: Payer: Self-pay | Admitting: Medical Oncology

## 2015-06-13 ENCOUNTER — Ambulatory Visit: Payer: Medicare HMO | Attending: Pain Medicine | Admitting: Pain Medicine

## 2015-06-13 ENCOUNTER — Encounter: Payer: Self-pay | Admitting: Pain Medicine

## 2015-06-13 ENCOUNTER — Ambulatory Visit: Payer: Medicare HMO | Admitting: Pain Medicine

## 2015-06-13 ENCOUNTER — Telehealth: Payer: Self-pay | Admitting: Pain Medicine

## 2015-06-13 VITALS — BP 139/91 | HR 78 | Temp 98.0°F | Resp 16 | Ht 67.0 in | Wt 180.0 lb

## 2015-06-13 DIAGNOSIS — Z794 Long term (current) use of insulin: Secondary | ICD-10-CM | POA: Insufficient documentation

## 2015-06-13 DIAGNOSIS — K0381 Cracked tooth: Secondary | ICD-10-CM | POA: Insufficient documentation

## 2015-06-13 DIAGNOSIS — Z79899 Other long term (current) drug therapy: Secondary | ICD-10-CM | POA: Insufficient documentation

## 2015-06-13 DIAGNOSIS — M5030 Other cervical disc degeneration, unspecified cervical region: Secondary | ICD-10-CM

## 2015-06-13 DIAGNOSIS — Z7982 Long term (current) use of aspirin: Secondary | ICD-10-CM | POA: Insufficient documentation

## 2015-06-13 DIAGNOSIS — M5412 Radiculopathy, cervical region: Secondary | ICD-10-CM | POA: Insufficient documentation

## 2015-06-13 DIAGNOSIS — Z880 Allergy status to penicillin: Secondary | ICD-10-CM | POA: Insufficient documentation

## 2015-06-13 DIAGNOSIS — Z7984 Long term (current) use of oral hypoglycemic drugs: Secondary | ICD-10-CM | POA: Diagnosis not present

## 2015-06-13 DIAGNOSIS — M47812 Spondylosis without myelopathy or radiculopathy, cervical region: Secondary | ICD-10-CM

## 2015-06-13 DIAGNOSIS — K029 Dental caries, unspecified: Secondary | ICD-10-CM | POA: Diagnosis not present

## 2015-06-13 DIAGNOSIS — M5126 Other intervertebral disc displacement, lumbar region: Secondary | ICD-10-CM | POA: Diagnosis not present

## 2015-06-13 DIAGNOSIS — E119 Type 2 diabetes mellitus without complications: Secondary | ICD-10-CM | POA: Insufficient documentation

## 2015-06-13 DIAGNOSIS — F17210 Nicotine dependence, cigarettes, uncomplicated: Secondary | ICD-10-CM | POA: Diagnosis not present

## 2015-06-13 DIAGNOSIS — I10 Essential (primary) hypertension: Secondary | ICD-10-CM | POA: Insufficient documentation

## 2015-06-13 DIAGNOSIS — J0100 Acute maxillary sinusitis, unspecified: Secondary | ICD-10-CM | POA: Diagnosis not present

## 2015-06-13 DIAGNOSIS — M5416 Radiculopathy, lumbar region: Secondary | ICD-10-CM

## 2015-06-13 DIAGNOSIS — M4802 Spinal stenosis, cervical region: Secondary | ICD-10-CM | POA: Diagnosis not present

## 2015-06-13 DIAGNOSIS — M542 Cervicalgia: Secondary | ICD-10-CM | POA: Diagnosis present

## 2015-06-13 DIAGNOSIS — M47816 Spondylosis without myelopathy or radiculopathy, lumbar region: Secondary | ICD-10-CM

## 2015-06-13 DIAGNOSIS — K0889 Other specified disorders of teeth and supporting structures: Secondary | ICD-10-CM | POA: Diagnosis present

## 2015-06-13 DIAGNOSIS — M545 Low back pain: Secondary | ICD-10-CM | POA: Diagnosis present

## 2015-06-13 DIAGNOSIS — M5481 Occipital neuralgia: Secondary | ICD-10-CM

## 2015-06-13 DIAGNOSIS — M5136 Other intervertebral disc degeneration, lumbar region: Secondary | ICD-10-CM

## 2015-06-13 DIAGNOSIS — M4806 Spinal stenosis, lumbar region: Secondary | ICD-10-CM | POA: Diagnosis not present

## 2015-06-13 DIAGNOSIS — J320 Chronic maxillary sinusitis: Secondary | ICD-10-CM

## 2015-06-13 MED ORDER — CLARITHROMYCIN 500 MG PO TABS: 500.0000 mg | ORAL_TABLET | Freq: Two times a day (BID) | ORAL | Status: DC

## 2015-06-13 MED ORDER — PREGABALIN 25 MG PO CAPS: ORAL_CAPSULE | ORAL | Status: DC

## 2015-06-13 MED ORDER — OXYCODONE HCL 5 MG PO TABS: ORAL_TABLET | ORAL | Status: DC

## 2015-06-13 MED ORDER — FEXOFENADINE-PSEUDOEPHED ER 60-120 MG PO TB12: 1.0000 | ORAL_TABLET | Freq: Two times a day (BID) | ORAL | Status: DC

## 2015-06-13 NOTE — Discharge Instructions (Signed)
Dental Caries °Dental caries is tooth decay. This decay can cause a hole in teeth (cavity) that can get bigger and deeper over time. °HOME CARE °· Brush and floss your teeth. Do this at least two times a day. °· Use a fluoride toothpaste. °· Use a mouth rinse if told by your dentist or doctor. °· Eat less sugary and starchy foods. Drink less sugary drinks. °· Avoid snacking often on sugary and starchy foods. Avoid sipping often on sugary drinks. °· Keep regular checkups and cleanings with your dentist. °· Use fluoride supplements if told by your dentist or doctor. °· Allow fluoride to be applied to teeth if told by your dentist or doctor. °  °This information is not intended to replace advice given to you by your health care provider. Make sure you discuss any questions you have with your health care provider. °  °Document Released: 01/23/2008 Document Revised: 05/06/2014 Document Reviewed: 04/17/2012 °Elsevier Interactive Patient Education ©2016 [employer] ° °

## 2015-06-13 NOTE — ED Provider Notes (Signed)
[facility] Emergency Department Provider Note  ____________________________________________  Time seen: Approximately 11:36 AM  I have reviewed the triage vital signs and the nursing notes.   HISTORY  Chief Complaint Facial Pain and Dental Pain    HPI Kyle [name] is a 55 y.o. male patient complaining of dental pain secondary to fractured tooth which occurred a couple months ago. Patient states that the pain has increased secondary to sinus congestion for 2 weeks. No palliative measures taken for this complaint.Patient rates the pain as 7/10. Patient states he is under pain management any pain medicine will be coordinated with his treating doctor.   Past Medical History  Diagnosis Date  . Asthma   . DM2 (diabetes mellitus, type 2) ([place])   . COPD (chronic obstructive pulmonary disease) ([place])   . Fainting spell   . GERD (gastroesophageal reflux disease)   . Allergy   . Hyperlipidemia with target LDL less than 70   . Hypertension   . Urine incontinence   . Coronary artery disease involving native coronary artery     a. 4 vessel CABG 10/2012 at [place]; b. [facility] 08/2014: small defect of mild severity present along apical lateral & apex location, no st segment changes,  LV function 45-54%, poor gated images lead to poor EF & wall motion assessment, low risk study  . S/P CABG x 4     a. LIMA-LAD, SVG-[place], SVG-OM,SVG-dRCA  . Chronic back pain 1981  . DDD (degenerative disc disease)   . Hepatitis C     2/2 tattoo  . Polysubstance abuse     a. ongoing daily tobacco abuse and etoh (on the weekends), remote crack/cocaine abuse last used approximately in 2006    Patient Active Problem List   Diagnosis Date Noted  . DDD (degenerative disc disease), lumbar 01/10/2015  . Facet syndrome, lumbar 01/10/2015  . Lumbar radiculopathy 01/10/2015  . DDD (degenerative disc disease), cervical 11/22/2014  . Cervical facet syndrome 11/22/2014   . Bilateral occipital neuralgia 11/22/2014  . Hospital discharge follow-up 10/16/2014  . Coronary artery disease involving native coronary artery   . S/P CABG x 4   . Chest pain with moderate risk for cardiac etiology 09/27/2014  . Cervicalgia 07/05/2014  . Acute bacterial rhinosinusitis 06/15/2014  . Non compliance w medication regimen 06/07/2014  . Chest tightness 06/07/2014  . Tobacco abuse counseling 01/05/2014  . Vitamin D deficiency 01/05/2014  . Transaminitis 01/05/2014  . Temporary low platelet count ([place]) 01/05/2014  . Diabetes mellitus type 2, uncontrolled ([place]) 01/05/2014  . Back pain 10/01/2013  . Need for prophylactic vaccination with tetanus-diphtheria (TD) 10/01/2013  . Penile lesion 10/01/2013  . Essential hypertension 08/29/2013  . Hyperlipidemia with target LDL less than 70 08/29/2013  . Callus of foot 08/29/2013  . Blurred vision 08/29/2013  . Itching 08/29/2013  . Exacerbation of chronic back pain 06/07/2013  . Hyperlipidemia 03/02/2013  . Former smoker 03/02/2013  . Routine general medical examination at a health care facility 02/24/2013  . Hepatitis C 02/24/2013  . Diabetes type 2, uncontrolled ([place]) 02/24/2013    Past Surgical History  Procedure Laterality Date  . Coronary artery bypass graft  8/14    CABG x 4; LIMA-LAD, SVG-RI, SVG-OM1, SVG-rPDA (Atretic RIMA).  . Cardiac catheterization  11/2012    [facility] - Multivessel CAD  . Neck injection      Current Outpatient Rx  Name  Route  Sig  Dispense  Refill  .  albuterol (PROVENTIL HFA;VENTOLIN HFA) 108 (90 BASE) MCG/ACT inhaler   Inhalation   Inhale 2 puffs into the lungs as needed. Patient taking differently: Inhale 2 puffs into the lungs every 6 (six) hours as needed for wheezing or shortness of breath.    1 Inhaler   2   . aspirin 325 MG tablet   Oral   Take 1 tablet (325 mg total) by mouth daily.   30 tablet   5   . atorvastatin (LIPITOR) 40 MG tablet   Oral   Take 1  tablet (40 mg total) by mouth daily at 6 PM. Patient taking differently: Take 40 mg by mouth daily as needed (high cholesterol).    30 tablet   0   . ciprofloxacin (CIPRO) 250 MG tablet   Oral   Take 1 tablet (250 mg total) by mouth 2 (two) times daily. Patient not taking: Reported on 05/22/2015   14 tablet   0   . clarithromycin (BIAXIN) 500 MG tablet   Oral   Take 1 tablet (500 mg total) by mouth 2 (two) times daily.   20 tablet   0   . fexofenadine-pseudoephedrine (ALLEGRA-D) 60-120 MG 12 hr tablet   Oral   Take 1 tablet by mouth 2 (two) times daily.   30 tablet   0   . glucose blood test strip      OneTouch ultra mini test strips. Use 4 times daily. Dx 250.0   100 each   5   . insulin NPH-regular Human (NOVOLIN 70/30) (70-30) 100 UNIT/ML injection      32 units in the AM and 22 units with evening meal         . Insulin Syringes, Disposable, U-100 1 ML MISC      To inject 70/30 bid   100 each   6   . LANCETS ULTRA FINE MISC      To check blood sugar four times daily   100 each   6   . metFORMIN (GLUCOPHAGE) 1000 MG tablet   Oral   Take 1 tablet (1,000 mg total) by mouth 2 (two) times daily with a meal.   60 tablet   5   . methocarbamol (ROBAXIN) 750 MG tablet   Oral   Take 1 tablet (750 mg total) by mouth 3 (three) times daily as needed for muscle spasms.   45 tablet   1   . metoprolol tartrate (LOPRESSOR) 25 MG tablet   Oral   Take 1.5 tablets (37.5 mg total) by mouth 2 (two) times daily.   135 tablet   3   . nitroGLYCERIN (NITROSTAT) 0.4 MG SL tablet   Sublingual   Place 1 tablet (0.4 mg total) under the tongue every 5 (five) minutes as needed for chest pain.   21 tablet   12   . oxyCODONE (OXY IR/ROXICODONE) 5 MG immediate release tablet      LIMIT one half to one tab by mouth per day or twice per day if tolerated   60 tablet   0   . pregabalin (LYRICA) 25 MG capsule      Limit 1 tab by mouth per day or twice per day if tolerated   NO GABAPENTIN (NEURONTIN)   60 capsule   0   . sildenafil (VIAGRA) 100 MG tablet   Oral   Take 100 mg by mouth daily as needed for erectile dysfunction.         . simvastatin (ZOCOR) 10  MG tablet   Oral   Take 10 mg by mouth daily at 6 PM.           Allergies Amoxicillin; Flexeril; Methadone; Tramadol; and Trazodone and nefazodone  Family History  Problem Relation Age of Onset  . Heart disease Mother   . Heart disease Father   . Hypertension Father   . Diabetes Sister   . Cancer Sister     skin cancer  . Diabetes Other   . Heart disease Other     Social History Social History  Substance Use Topics  . Smoking status: Current Every Day Smoker -- 35 years    Types: Cigarettes    Last Attempt to Quit: 11/24/2012  . Smokeless tobacco: Never Used  . Alcohol Use: Yes     Comment: occasional    Review of Systems Constitutional: No fever/chills Eyes: No visual changes. ENT: No sore throat. Sinus congestion and dental pain. Cardiovascular: Denies chest pain. Respiratory: Denies shortness of breath. Gastrointestinal: No abdominal pain.  No nausea, no vomiting.  No diarrhea.  No constipation. Genitourinary: Negative for dysuria. Musculoskeletal: Negative for back pain. Skin: Negative for rash. Neurological: Negative for headaches, focal weakness or numbness. 10-point ROS otherwise negative.  ____________________________________________   PHYSICAL EXAM:  VITAL SIGNS: ED Triage Vitals  Enc Vitals Group     BP 06/13/15 1036 143/91 mmHg     Pulse Rate 06/13/15 1036 84     Resp 06/13/15 1036 18     Temp 06/13/15 1036 97.8 F (36.6 C)     Temp Source 06/13/15 1036 Oral     SpO2 06/13/15 1036 97 %     Weight 06/13/15 1036 180 lb (81.647 kg)     Height 06/13/15 1036 5\' 7"  (1.702 m)     Head Cir --      Peak Flow --      Pain Score 06/13/15 1037 7     Pain Loc --      Pain Edu? --      Excl. in [place]? --     Constitutional: Alert and oriented. Well appearing  and in no acute distress. Eyes: Conjunctivae are normal. PERRL. EOMI. Head: Atraumatic. Nose: No congestion/rhinnorhea. Right maxillary guarding with palpation. Edematous bilateral nasal turbinates. Mouth/Throat: Mucous membranes are moist.  Oropharynx non-erythematous. Neck: No stridor. No cervical spine tenderness to palpation. Hematological/Lymphatic/Immunilogical: No cervical lymphadenopathy. Cardiovascular: Normal rate, regular rhythm. Grossly normal heart sounds.  Good peripheral circulation. Respiratory: Normal respiratory effort.  No retractions. Lungs CTAB. Gastrointestinal: Soft and nontender. No distention. No abdominal bruits. No CVA tenderness. Musculoskeletal: No lower extremity tenderness nor edema.  No joint effusions. Neurologic:  Normal speech and language. No gross focal neurologic deficits are appreciated. No gait instability. Skin:  Skin is warm, dry and intact. No rash noted. Psychiatric: Mood and affect are normal. Speech and behavior are normal.  ____________________________________________   LABS (all labs ordered are listed, but only abnormal results are displayed)  Labs Reviewed - No data to display ____________________________________________  EKG   ____________________________________________  RADIOLOGY   ____________________________________________   PROCEDURES  Procedure(s) performed: None  Critical Care performed: No  ____________________________________________   INITIAL IMPRESSION / ASSESSMENT AND PLAN / ED COURSE  Pertinent labs & imaging results that were available during my care of the patient were reviewed by me and considered in my medical decision making (see chart for details).  Dental pain secondary to fractured tooth. Right maxillary sinusitis. He gives discharged care instructions. Patient will follow-up  by going to the pain management clinic for his pain medication and [facility]. Patient given  prescription for Biaxin and Allegra-D. ____________________________________________   FINAL CLINICAL IMPRESSION(S) / ED DIAGNOSES  Final diagnoses:  Pain due to dental caries  Right maxillary sinusitis      Sable Feil, PA-C 06/13/15 1156  Lavonia Drafts, MD 06/13/15 1410

## 2015-06-13 NOTE — Progress Notes (Signed)
Safety precautions to be maintained throughout the outpatient stay will include: orient to surroundings, keep bed in low position, maintain call bell within reach at all times, provide assistance with transfer out of bed and ambulation.  

## 2015-06-13 NOTE — ED Notes (Signed)
States he broke a tooth couple of months ago..conts to have tooth pain and some pressure and pain to sinus areas.

## 2015-06-13 NOTE — Patient Instructions (Addendum)
PLAN   Continue present medications Lyrica and oxycodone   F/U PCP C Doss for evaliation of  BP sinus condition dental condition and general medical  condition  F/U surgical evaluation as planned. Please ask staff date of your neurosurgical evaluation  F/U neurological evaluation. May consider pending follow-up evaluations  Dental evaluation as discussed and as planned for today or tomorrow  May consider radiofrequency rhizolysis or intraspinal procedures pending response to present treatment and F/U evaluation

## 2015-06-13 NOTE — ED Notes (Signed)
Pt reports sinus pressure and pain and dental pain that began 2 weeks ago.

## 2015-06-13 NOTE — Progress Notes (Signed)
Subjective:    Patient ID: Kyle [name], male    DOB: 06/08/1960, 55 y.o.   MRN: [id number]  HPI  Patient is a 55 year old gentleman who returns to pain management for further evaluation and treatment of pain occurring in the region of the neck upper extremity region and lower back lower extremity regions with mid back pain is well . The patient is with significant pain at the present time which intravenous to sinus headache as well as dental condition. We discussed patient's condition and patient will undergo further evaluation with his primary care physician as well as evaluation and treatment by dentists as discussed. We will continue presently prescribed medications. The patient denies any other significant change in condition. We will avoid interventional treatment this time and will await resolution of patient's sinus condition and dental condition as discussed and as explained to patient on today's visit. The patient states the pain with neck associated with pain of the upper extremity regions which is aggravated region lifting pushing pulling maneuvers. The lower back and lower extremity pain is aggravated by standing walking twisting turning maneuvers pain becomes more intense as the day progresses and it appears with patient's ability to obtain restful sleep. We will consider modifications of treatment regimen pending response to present treatment and follow-up evaluation as well as pending resolution of his sinus and dental conditions as discussed and as explained to patient on today's visit. The patient was in agreement with suggested treatment plan       Review of Systems     Objective:   Physical Exam  There was tends to palpation of paraspinal muscular treat and cervical region cervical facet region palpation which reproduces pain of moderate degree there was moderate tenderness of the splenius capitis and occipitalis musculature regions. Palpation of the acromioclavicular  and glenohumeral joint region associated with mild to moderate discomfort. The patient appeared to be with unremarkable Spurling's maneuver. The patient was with unremarkable drop test as well. Palpation over the thoracic facet thoracic paraspinal musculature region was attends to palpation with no crepitus of the thoracic region noted. There was moderate muscle spasms of the thoracic paraspinal musculature region noted. Chin over the lumbar paraspinal musculatures and lumbar facet region was with moderate discomfort with lateral bending rotation extension and palpation of the lumbar facets reproducing moderate discomfort. There was moderate tenderness of the PSIS and PII S region as well as the gluteal and piriformis musculature regions. Palpation of the greater trochanteric region and iliotibial band region reproduced mild discomfort. Straight leg raising was tolerates approximately 20 without a definite increase of pain with dorsiflexion noted. There was negative clonus negative Homans. DTRs were difficult to elicit patient had difficulty relaxing. No definite sensory deficit or dermatomal distribution detected. Homans. Abdomen was nontender with no costovertebral tenderness noted.      Assessment & Plan:     1. 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 Is also mild spinal stenosis at C4-5. 2. Bulging discs and mild facet disease in the lumbar spine but no significant disc protrusions, spinal or foraminal stenosis  Cervical facet syndrome  Cervical radiculopathy  MRI  of the lumbar region demonstrates bulging disks with facet degenerative changes  Lumbar facet syndrome      PLAN   Continue present medications Lyrica and oxycodone   F/U PCP C Doss for evaliation of  BP sinus condition dental condition and general medical  condition  F/U surgical evaluation as planned. Please ask staff date  of your neurosurgical evaluation  F/U neurological evaluation. May  consider pending follow-up evaluations  Dental evaluation as discussed and as planned for today or tomorrow  May consider radiofrequency rhizolysis or intraspinal procedures pending response to present treatment and F/U evaluation

## 2015-06-13 NOTE — Telephone Encounter (Signed)
Left vmail at 6:55 on 06-13-15, could not come in to appt today and will call to resched

## 2015-07-11 ENCOUNTER — Encounter: Payer: Medicare HMO | Admitting: Pain Medicine

## 2015-07-17 ENCOUNTER — Encounter: Payer: Medicare HMO | Admitting: Pain Medicine

## 2015-07-20 ENCOUNTER — Encounter: Payer: Medicare HMO | Admitting: Pain Medicine

## 2015-07-21 ENCOUNTER — Ambulatory Visit: Payer: Medicare HMO | Admitting: Physician Assistant

## 2015-08-08 ENCOUNTER — Ambulatory Visit: Payer: Medicare HMO | Attending: Pain Medicine | Admitting: Pain Medicine

## 2015-08-08 ENCOUNTER — Encounter: Payer: Self-pay | Admitting: Pain Medicine

## 2015-08-08 VITALS — BP 149/92 | HR 87 | Temp 98.0°F | Resp 16 | Ht 67.0 in | Wt 164.5 lb

## 2015-08-08 DIAGNOSIS — M47812 Spondylosis without myelopathy or radiculopathy, cervical region: Secondary | ICD-10-CM

## 2015-08-08 DIAGNOSIS — M5481 Occipital neuralgia: Secondary | ICD-10-CM | POA: Diagnosis not present

## 2015-08-08 DIAGNOSIS — R51 Headache: Secondary | ICD-10-CM | POA: Diagnosis present

## 2015-08-08 DIAGNOSIS — M5126 Other intervertebral disc displacement, lumbar region: Secondary | ICD-10-CM | POA: Diagnosis not present

## 2015-08-08 DIAGNOSIS — M4802 Spinal stenosis, cervical region: Secondary | ICD-10-CM | POA: Insufficient documentation

## 2015-08-08 DIAGNOSIS — M545 Low back pain: Secondary | ICD-10-CM | POA: Diagnosis present

## 2015-08-08 DIAGNOSIS — M47816 Spondylosis without myelopathy or radiculopathy, lumbar region: Secondary | ICD-10-CM | POA: Insufficient documentation

## 2015-08-08 DIAGNOSIS — M5136 Other intervertebral disc degeneration, lumbar region: Secondary | ICD-10-CM

## 2015-08-08 DIAGNOSIS — M5416 Radiculopathy, lumbar region: Secondary | ICD-10-CM

## 2015-08-08 DIAGNOSIS — M5412 Radiculopathy, cervical region: Secondary | ICD-10-CM | POA: Diagnosis not present

## 2015-08-08 DIAGNOSIS — M542 Cervicalgia: Secondary | ICD-10-CM | POA: Diagnosis present

## 2015-08-08 DIAGNOSIS — M5030 Other cervical disc degeneration, unspecified cervical region: Secondary | ICD-10-CM

## 2015-08-08 NOTE — Progress Notes (Signed)
Safety precautions to be maintained throughout the outpatient stay will include: orient to surroundings, keep bed in low position, maintain call bell within reach at all times, provide assistance with transfer out of bed and ambulation.  

## 2015-08-08 NOTE — Patient Instructions (Addendum)
PLAN   Continue present medications Lyrica and oxycodone   Greater occipital nerve block to be performed at time of return appointment  F/U PCP C Doss for evaliation of  BP sinus condition dental condition and general medical  condition  F/U surgical evaluation as planned. Please ask staff date of your neurosurgical evaluation  Would like to repeat UDS today please  F/U neurological evaluation. May consider pending follow-up evaluations  Dental follow-up evaluation as needed  May consider radiofrequency rhizolysis or intraspinal procedures pending response to present treatment and F/U evaluation   Patient is to call pain management should there be change in condition or should patient have other concerns regarding condition prior to scheduled return appointmentOccipital Nerve Block Patient Information  Description: The occipital nerves originate in the cervical (neck) spinal cord and travel upward through muscle and tissue to supply sensation to the back of the head and top of the scalp.  In addition, the nerves control some of the muscles of the scalp.  Occipital neuralgia is an irritation of these nerves which can cause headaches, numbness of the scalp, and neck discomfort.     The occipital nerve block will interrupt nerve transmission through these nerves and can relieve pain and spasm.  The block consists of insertion of a small needle under the skin in the back of the head to deposit local anesthetic (numbing medicine) and/or steroids around the nerve.  The entire block usually lasts less than 5 minutes.  Conditions which may be treated by occipital blocks:   Muscular pain and spasm of the scalp  Nerve irritation, back of the head  Headaches  Upper neck pain  Preparation for the injection:  1. Do not eat any solid food or dairy products within 8 hours of your appointment. 2. You may drink clear liquids up to 3 hours before appointment.  Clear liquids include water, black  coffee, juice or soda.  No milk or cream please. 3. You may take your regular medication, including pain medications, with a sip of water before you appointment.  Diabetics should hold regular insulin (if taken separately) and take 1/2 normal NPH dose the morning of the procedure.  Carry some sugar containing items with you to your appointment. 4. A driver must accompany you and be prepared to drive you home after your procedure. 5. Bring all your current medications with you. 6. An IV may be inserted and sedation may be given at the discretion of the physician. 7. A blood pressure cuff, EKG, and other monitors will often be applied during the procedure.  Some patients may need to have extra oxygen administered for a short period. 8. You will be asked to provide medical information, including your allergies and medications, prior to the procedure.  We must know immediately if you are taking blood thinners (like Coumadin/Warfarin) or if you are allergic to IV iodine contrast (dye).  We must know if you could possible be pregnant.  9. Do not wear a high collared shirt or turtleneck.  Tie long hair up in the back if possible.  Possible side-effects:   Bleeding from needle site  Infection (rare, may require surgery)  Nerve injury (rare)  Hair on back of neck can be tinged with iodine scrub (this will wash out)  Light-headedness (temporary)  Pain at injection site (several days)  Decreased blood pressure (rare, temporary)  Seizure (very rare)  Call if you experience:   Hives or difficulty breathing ( go to the emergency room)  Inflammation or drainage at the injection site(s)  Please note:  Although the local anesthetic injected can often make your painful muscles or headache feel good for several hours after the injection, the pain may return.  It takes 3-7 days for steroids to work.  You may not notice any pain relief for at least one week.  If effective, we will often do a series  of injections spaced 3-6 weeks apart to maximally decrease your pain.  If you have any questions, please call [phone] [place]  What are the risk, side effects and possible complications? Generally speaking, most procedures are safe.  However, with any procedure there are risks, side effects, and the possibility of complications.  The risks and complications are dependent upon the sites that are lesioned, or the type of nerve block to be performed.  The closer the procedure is to the spine, the more serious the risks are.  Great care is taken when placing the radio frequency needles, block needles or lesioning probes, but sometimes complications can occur. 1. Infection: Any time there is an injection through the skin, there is a risk of infection.  This is why sterile conditions are used for these blocks.  There are four possible types of infection. 1. Localized skin infection. 2. Central Nervous System Infection-This can be in the form of Meningitis, which can be deadly. 3. Epidural Infections-This can be in the form of an epidural abscess, which can cause pressure inside of the spine, causing compression of the spinal cord with subsequent paralysis. This would require an emergency surgery to decompress, and there are no guarantees that the patient would recover from the paralysis. 4. Discitis-This is an infection of the intervertebral discs.  It occurs in about 1% of discography procedures.  It is difficult to treat and it may lead to surgery.        2. Pain: the needles have to go through skin and soft tissues, will cause soreness.       3. Damage to internal structures:  The nerves to be lesioned may be near blood vessels or    other nerves which can be potentially damaged.       4. Bleeding: Bleeding is more common if the patient is taking blood thinners such as  aspirin, Coumadin, Ticiid, Plavix, etc., or if he/she have  some genetic predisposition  such as hemophilia. Bleeding into the spinal canal can cause compression of the spinal  cord with subsequent paralysis.  This would require an emergency surgery to  decompress and there are no guarantees that the patient would recover from the  paralysis.       5. Pneumothorax:  Puncturing of a lung is a possibility, every time a needle is introduced in  the area of the chest or upper back.  Pneumothorax refers to free air around the  collapsed lung(s), inside of the thoracic cavity (chest cavity).  Another two possible  complications related to a similar event would include: Hemothorax and Chylothorax.   These are variations of the Pneumothorax, where instead of air around the collapsed  lung(s), you may have blood or chyle, respectively.       6. Spinal headaches: They may occur with any procedures in the area of the spine.       7. Persistent CSF (Cerebro-Spinal Fluid) leakage: This is a rare problem, but may occur  with prolonged intrathecal or epidural catheters either due to the  formation of a fistulous  track or a dural tear.       8. Nerve damage: By working so close to the spinal cord, there is always a possibility of  nerve damage, which could be as serious as a permanent spinal cord injury with  paralysis.       9. Death:  Although rare, severe deadly allergic reactions known as "Anaphylactic  reaction" can occur to any of the medications used.      10. Worsening of the symptoms:  We can always make thing worse.  What are the chances of something like this happening? Chances of any of this occuring are extremely low.  By statistics, you have more of a chance of getting killed in a motor vehicle accident: while driving to the hospital than any of the above occurring .  Nevertheless, you should be aware that they are possibilities.  In general, it is similar to taking a shower.  Everybody knows that you can slip, hit your head and get killed.  Does that mean that you  should not shower again?  Nevertheless always keep in mind that statistics do not mean anything if you happen to be on the wrong side of them.  Even if a procedure has a 1 (one) in a 1,000,000 (million) chance of going wrong, it you happen to be that one..Also, keep in mind that by statistics, you have more of a chance of having something go wrong when taking medications.  Who should not have this procedure? If you are on a blood thinning medication (e.g. Coumadin, Plavix, see list of "Blood Thinners"), or if you have an active infection going on, you should not have the procedure.  If you are taking any blood thinners, please inform your physician.  How should I prepare for this procedure?  Do not eat or drink anything at least six hours prior to the procedure.  Bring a driver with you .  It cannot be a taxi.  Come accompanied by an adult that can drive you back, and that is strong enough to help you if your legs get weak or numb from the local anesthetic.  Take all of your medicines the morning of the procedure with just enough water to swallow them.  If you have diabetes, make sure that you are scheduled to have your procedure done first thing in the morning, whenever possible.  If you have diabetes, take only half of your insulin dose and notify our nurse that you have done so as soon as you arrive at the clinic.  If you are diabetic, but only take blood sugar pills (oral hypoglycemic), then do not take them on the morning of your procedure.  You may take them after you have had the procedure.  Do not take aspirin or any aspirin-containing medications, at least eleven (11) days prior to the procedure.  They may prolong bleeding.  Wear loose fitting clothing that may be easy to take off and that you would not mind if it got stained with Betadine or blood.  Do not wear any jewelry or perfume  Remove any nail coloring.  It will interfere with some of our monitoring equipment.  NOTE:  Remember that this is not meant to be interpreted as a complete list of all possible complications.  Unforeseen problems may occur.  BLOOD THINNERS The following drugs contain aspirin or other products, which can cause increased bleeding during surgery and should not be taken for 2 weeks prior to  and 1 week after surgery.  If you should need take something for relief of minor pain, you may take acetaminophen which is found in Tylenol,m Datril, Anacin-3 and Panadol. It is not blood thinner. The products listed below are.  Do not take any of the products listed below in addition to any listed on your instruction sheet.  A.P.C or A.P.C with Codeine Codeine Phosphate Capsules #3 Ibuprofen Ridaura  ABC compound Congesprin Imuran rimadil  Advil Cope Indocin Robaxisal  Alka-Seltzer Effervescent Pain Reliever and Antacid Coricidin or Coricidin-D  Indomethacin Rufen  Alka-Seltzer plus Cold Medicine Cosprin Ketoprofen S-A-C Tablets  Anacin Analgesic Tablets or Capsules Coumadin Korlgesic Salflex  Anacin Extra Strength Analgesic tablets or capsules CP-2 Tablets Lanoril Salicylate  Anaprox Cuprimine Capsules Levenox Salocol  Anexsia-D Dalteparin Magan Salsalate  Anodynos Darvon compound Magnesium Salicylate Sine-off  Ansaid Dasin Capsules Magsal Sodium Salicylate  Anturane Depen Capsules Marnal Soma  APF Arthritis pain formula Dewitt's Pills Measurin Stanback  Argesic Dia-Gesic Meclofenamic Sulfinpyrazone  Arthritis Bayer Timed Release Aspirin Diclofenac Meclomen Sulindac  Arthritis pain formula Anacin Dicumarol Medipren Supac  Analgesic (Safety coated) Arthralgen Diffunasal Mefanamic Suprofen  Arthritis Strength Bufferin Dihydrocodeine Mepro Compound Suprol  Arthropan liquid Dopirydamole Methcarbomol with Aspirin Synalgos  ASA tablets/Enseals Disalcid Micrainin Tagament  Ascriptin Doan's Midol Talwin  Ascriptin A/D Dolene Mobidin Tanderil  Ascriptin Extra Strength Dolobid Moblgesic Ticlid   Ascriptin with Codeine Doloprin or Doloprin with Codeine Momentum Tolectin  Asperbuf Duoprin Mono-gesic Trendar  Aspergum Duradyne Motrin or Motrin IB Triminicin  Aspirin plain, buffered or enteric coated Durasal Myochrisine Trigesic  Aspirin Suppositories Easprin Nalfon Trillsate  Aspirin with Codeine Ecotrin Regular or Extra Strength Naprosyn Uracel  Atromid-S Efficin Naproxen Ursinus  Auranofin Capsules Elmiron Neocylate Vanquish  Axotal Emagrin Norgesic Verin  Azathioprine Empirin or Empirin with Codeine Normiflo Vitamin E  Azolid Emprazil Nuprin Voltaren  Bayer Aspirin plain, buffered or children's or timed BC Tablets or powders Encaprin Orgaran Warfarin Sodium  Buff-a-Comp Enoxaparin Orudis Zorpin  Buff-a-Comp with Codeine Equegesic Os-Cal-Gesic   Buffaprin Excedrin plain, buffered or Extra Strength Oxalid   Bufferin Arthritis Strength Feldene Oxphenbutazone   Bufferin plain or Extra Strength Feldene Capsules Oxycodone with Aspirin   Bufferin with Codeine Fenoprofen Fenoprofen Pabalate or Pabalate-SF   Buffets II Flogesic Panagesic   Buffinol plain or Extra Strength Florinal or Florinal with Codeine Panwarfarin   Buf-Tabs Flurbiprofen Penicillamine   Butalbital Compound Four-way cold tablets Penicillin   Butazolidin Fragmin Pepto-Bismol   Carbenicillin Geminisyn Percodan   Carna Arthritis Reliever Geopen Persantine   Carprofen Gold's salt Persistin   Chloramphenicol Goody's Phenylbutazone   Chloromycetin Haltrain Piroxlcam   Clmetidine heparin Plaquenil   Cllnoril Hyco-pap Ponstel   Clofibrate Hydroxy chloroquine Propoxyphen         Before stopping any of these medications, be sure to consult the physician who ordered them.  Some, such as Coumadin (Warfarin) are ordered to prevent or treat serious conditions such as "deep thrombosis", "pumonary embolisms", and other heart problems.  The amount of time that you may need off of the medication may also vary with the medication  and the reason for which you were taking it.  If you are taking any of these medications, please make sure you notify your pain physician before you undergo any procedures.

## 2015-08-08 NOTE — Progress Notes (Signed)
Subjective:    Patient ID: Kyle [name], male    DOB: 02-Mar-1961, 55 y.o.   MRN: [id number]  HPI  The patient is a 55 year old gentleman who returns to pain management for further evaluation and treatment of pain involving the neck associated with headaches as well as pain involving the lower back and lower extremity regions. The patient states that his response in pain involves pain which radiates from the back of the neck and continues to the back of the head and continuing forward to the area behind the eyes. The patient states that the lower back lower extremity pain is fairly well-controlled at this time. The patient denies any trauma change in events of daily living the call significant change in symptomatology. The patient is undergone dental evaluation and treatment and states that his dental condition is fairly well-controlled at this time. We discussed patient's headaches and patient appears to be with significant component of pain due to bilateral occipital neuralgia. We will proceed with bilateral occipital neuralgia at time of return appointment as discussed. The patient was with understanding and in agreement with suggested treatment plan. The patient was not prescribed Lyrica oxycodone today's visit. There was need to repeat urine drug screen prior to prescribing these medications as explained to patient on today's visit. All were understanding and agreed with suggested treatment plan to proceed with greater occipital nerve block at time of return appointment.            Review of Systems     Objective:   Physical Exam   There was tenderness to palpation of the splenius capitis and occipitalis musculature regions a moderate to moderately severe degree with palpation over the cervical facet cervical paraspinal musculature regions reproducing moderate discomfort. Palpation over the thoracic paraspinal must reason thoracic facet region was with moderate discomfort as well  with no crepitus of the thoracic region noted. There were no new masses of the hip negative noted. There were no bounding pulsations of the temporal regions noted. Palpation of the temporomandibular joint region was without increased pain of significant degree. Palpation over the lumbar paraspinal must reason lumbar facet region was with mild to moderate discomfort with lateral bending rotation extension and palpation of the lumbar facets reproducing moderate discomfort. Straight leg raise was tolerates approximately 30 without a definite increase of pain with dorsiflexion noted. There appeared to be negative clonus negative Homans. DTRs appeared to be trace at the knees. EHL strength was slightly decreased without a sensory deficit or dermatomal distribution detected. Palpation over the PSIS and PII S region was with mild to moderate discomfort with mild tenderness of the greater trochanteric region and iliotibial band region. The knees were without excessive tends to palpation. There was negative anterior and negative posterior drawer signs without ballottement of the patella. Abdomen was nontender with no costovertebral tenderness noted        Assessment & Plan:    Bilateral occipital neuralgia  1. 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 Is also mild spinal stenosis at C4-5. 2. Bulging discs and mild facet disease in the lumbar spine but no significant disc protrusions, spinal or foraminal stenosis  Cervical facet syndrome  Cervical radiculopathy  MRI  of the lumbar region demonstrates bulging disks with facet degenerative changes  Lumbar facet syndrome     PLAN   Continue present medications Lyrica and oxycodone   Greater occipital nerve block to be performed at time of return appointment  F/U PCP C  Doss for evaliation of  BP sinus condition dental condition and general medical  condition  F/U surgical evaluation as planned. Please ask staff date of  your neurosurgical evaluation  Would like to repeat UDS today please  F/U neurological evaluation. May consider pending follow-up evaluations  Dental follow-up evaluation as needed  May consider radiofrequency rhizolysis or intraspinal procedures pending response to present treatment and F/U evaluation   Patient is to call pain management should there be change in condition or should patient have other concerns regarding condition prior to scheduled return appointment

## 2015-08-12 LAB — TOXASSURE SELECT 13 (MW), URINE: PDF: 0

## 2015-08-14 ENCOUNTER — Encounter: Payer: Self-pay | Admitting: Physician Assistant

## 2015-08-14 ENCOUNTER — Ambulatory Visit (INDEPENDENT_AMBULATORY_CARE_PROVIDER_SITE_OTHER): Payer: Medicare HMO | Admitting: Physician Assistant

## 2015-08-14 VITALS — BP 150/88 | HR 77 | Ht 67.0 in | Wt 167.5 lb

## 2015-08-14 DIAGNOSIS — R0602 Shortness of breath: Secondary | ICD-10-CM | POA: Diagnosis not present

## 2015-08-14 DIAGNOSIS — IMO0001 Reserved for inherently not codable concepts without codable children: Secondary | ICD-10-CM

## 2015-08-14 DIAGNOSIS — I10 Essential (primary) hypertension: Secondary | ICD-10-CM | POA: Diagnosis not present

## 2015-08-14 DIAGNOSIS — I25810 Atherosclerosis of coronary artery bypass graft(s) without angina pectoris: Secondary | ICD-10-CM | POA: Diagnosis not present

## 2015-08-14 DIAGNOSIS — Z794 Long term (current) use of insulin: Secondary | ICD-10-CM

## 2015-08-14 DIAGNOSIS — J439 Emphysema, unspecified: Secondary | ICD-10-CM

## 2015-08-14 DIAGNOSIS — E119 Type 2 diabetes mellitus without complications: Secondary | ICD-10-CM

## 2015-08-14 DIAGNOSIS — E785 Hyperlipidemia, unspecified: Secondary | ICD-10-CM

## 2015-08-14 MED ORDER — ISOSORBIDE MONONITRATE ER 30 MG PO TB24: 30.0000 mg | ORAL_TABLET | Freq: Every day | ORAL | Status: DC

## 2015-08-14 NOTE — Patient Instructions (Addendum)
Medication Instructions:  Your physician has recommended you make the following change in your medication:  1. START Imdur 30 mg once daily   Labwork: Labs today CMET and lipid profile.  Testing/Procedures: Your physician has requested that you have an echocardiogram. Echocardiography is a painless test that uses sound waves to create images of your heart. It provides your doctor with information about the size and shape of your heart and how well your heart's chambers and valves are working. This procedure takes approximately one hour. There are no restrictions for this procedure.  Date & Time: __________________________________________________________________________________  Follow-Up: Your physician recommends that you schedule a follow-up appointment after Echo has been done to review results.  Date & Time:__________________________________________________________________________________     Any Other Special Instructions Will Be Listed Below (If Applicable).     If you need a refill on your cardiac medications before your next appointment, please call your pharmacy.  Echocardiogram An echocardiogram, or echocardiography, uses sound waves (ultrasound) to produce an image of your heart. The echocardiogram is simple, painless, obtained within a short period of time, and offers valuable information to your health care provider. The images from an echocardiogram can provide information such as:  Evidence of coronary artery disease (CAD).  Heart size.  Heart muscle function.  Heart valve function.  Aneurysm detection.  Evidence of a past heart attack.  Fluid buildup around the heart.  Heart muscle thickening.  Assess heart valve function. LET [facility] CARE PROVIDER KNOW ABOUT:  Any allergies you have.  All medicines you are taking, including vitamins, herbs, eye drops, creams, and over-the-counter medicines.  Previous problems you or members of your family  have had with the use of anesthetics.  Any blood disorders you have.  Previous surgeries you have had.  Medical conditions you have.  Possibility of pregnancy, if this applies. BEFORE THE PROCEDURE  No special preparation is needed. Eat and drink normally.  PROCEDURE   In order to produce an image of your heart, gel will be applied to your chest and a wand-like tool (transducer) will be moved over your chest. The gel will help transmit the sound waves from the transducer. The sound waves will harmlessly bounce off your heart to allow the heart images to be captured in real-time motion. These images will then be recorded.  You may need an IV to receive a medicine that improves the quality of the pictures. AFTER THE PROCEDURE You may return to your normal schedule including diet, activities, and medicines, unless your health care provider tells you otherwise.   This information is not intended to replace advice given to you by your health care provider. Make sure you discuss any questions you have with your health care provider.   Document Released: 04/12/2000 Document Revised: 05/06/2014 Document Reviewed: 12/21/2012 Elsevier Interactive Patient Education [employer].

## 2015-08-14 NOTE — Progress Notes (Signed)
Cardiology Office Note Date:  08/14/2015  Patient ID:  Mearl, Kellom Jul 07, 1960, MRN [id number] PCP:  Marden Noble, MD  Cardiologist:  Dr. Rockey Situ, MD    Chief Complaint: Hospital follow up from admission 08/2014  History of Present Illness: Kyle [name] is a 55 y.o. male with history of CAD s/p 4 vessel CABG in 10/2012 at the [facility], IDDM, HTN, HLD, hepatitis C, COPD, ongoing tobacco abuse, and chronic back pain who presents for routine follow up of the above.   He has known CAD s/p 4 vessel CABG in 2014 after undergoing a LHC that showed severe 3 vessel CAD. He was last seen by a cardiologist in 02/2013. His bypass was performed at the [facility] in [place], [place]. Records indicate LIMA-LAD, SVG-ramus, SVG-OM, SVG PDA. RIMA was found to be atretic and could not be used for the Ramus graft. He was admitted to [facility] 08/2014 for resting chest pain. He continues to smoke tobacco at 1 pack per day and has done so for the past 20+ years. He drinks at least 5-6 beers plus 1 pint of liqueur each weekend. He does not drink during the week. He has a remote history of illicit drug abuse with crack/cocaine 10+ years ago, but denies any recent abuse.   He was admitted to [facility] 09/27/2014-09/29/2014 for chest pain. He had negative troponin x 4 and a negative CXR. EKG showed NSR, 81 bpm, bifascicular block, nonspecific inferolateral st/t changes. WBC 13.1. At that time he underwent Lexiscan Myoview to evaluate for high risk ischemia which was low risk with a small defect of mild severity present in the apical lateral and apex location. LVEF was mildly decreased at 45-54% with poorly gated images. UDS was negative. He was discharged with chest pain felt to be noncardiac.   Since then, he has continued to do well. He reports no changes in his health. He is walking 2 miles almost everyday without any chest pain or SOB. At the end of his walk, sometimes he will develop left-sided  chest pain that will last for 2-3 minutes before self resolving. He will also develop SOB at varying times without exertion. He has not felt the need to take any SL NTG since he was last seen by our group at [facility] in 08/2014. He is down to 2-3 cigarettes weekly now. His blood pressure typically runs in the [id number] to [id number] systolic. His blood sugar runs around 127-240 in the morning. Overall, he feels like he is doing well.    Past Medical History  Diagnosis Date  . Asthma   . DM2 (diabetes mellitus, type 2) ([place])   . COPD (chronic obstructive pulmonary disease) ([place])   . Fainting spell   . GERD (gastroesophageal reflux disease)   . Allergy   . Hyperlipidemia with target LDL less than 70   . Hypertension   . Urine incontinence   . Coronary artery disease involving native coronary artery     a. 4 vessel CABG 10/2012 at [facility]; b. [facility] 08/2014: small defect of mild severity present along apical lateral & apex location, no st segment changes,  LV function 45-54%, poor gated images lead to poor EF & wall motion assessment, low risk study  . S/P CABG x 4     a. LIMA-LAD, SVG-Ramus, SVG-OM,SVG-dRCA  . Chronic back pain 1981  . DDD (degenerative disc disease)   . Hepatitis C     2/2 tattoo  .  Polysubstance abuse     a. ongoing daily tobacco abuse and etoh (on the weekends), remote crack/cocaine abuse last used approximately in 2006    Past Surgical History  Procedure Laterality Date  . Coronary artery bypass graft  8/14    CABG x 4; LIMA-LAD, SVG-RI, SVG-OM1, SVG-rPDA (Atretic RIMA).  . Cardiac catheterization  11/2012    [facility] - Multivessel CAD  . Neck injection      Current Outpatient Prescriptions  Medication Sig Dispense Refill  . albuterol (PROVENTIL HFA;VENTOLIN HFA) 108 (90 BASE) MCG/ACT inhaler Inhale 2 puffs into the lungs as needed. (Patient taking differently: Inhale 2 puffs into the lungs every 6 (six) hours as needed for wheezing or shortness  of breath. ) 1 Inhaler 2  . aspirin 325 MG tablet Take 1 tablet (325 mg total) by mouth daily. 30 tablet 5  . atorvastatin (LIPITOR) 40 MG tablet Take 1 tablet (40 mg total) by mouth daily at 6 PM. (Patient taking differently: Take 40 mg by mouth daily as needed (high cholesterol). ) 30 tablet 0  . fexofenadine-pseudoephedrine (ALLEGRA-D) 60-120 MG 12 hr tablet Take 1 tablet by mouth 2 (two) times daily. 30 tablet 0  . glucose blood test strip OneTouch ultra mini test strips. Use 4 times daily. Dx 250.0 100 each 5  . insulin NPH-regular Human (NOVOLIN 70/30) (70-30) 100 UNIT/ML injection 32 units in the AM and 22 units with evening meal    . Insulin Syringes, Disposable, U-100 1 ML MISC To inject 70/30 bid 100 each 6  . LANCETS ULTRA FINE MISC To check blood sugar four times daily 100 each 6  . metFORMIN (GLUCOPHAGE) 1000 MG tablet Take 1 tablet (1,000 mg total) by mouth 2 (two) times daily with a meal. 60 tablet 5  . methocarbamol (ROBAXIN) 750 MG tablet Take 1 tablet (750 mg total) by mouth 3 (three) times daily as needed for muscle spasms. 45 tablet 1  . metoprolol tartrate (LOPRESSOR) 25 MG tablet Take 1.5 tablets (37.5 mg total) by mouth 2 (two) times daily. 135 tablet 3  . nitroGLYCERIN (NITROSTAT) 0.4 MG SL tablet Place 1 tablet (0.4 mg total) under the tongue every 5 (five) minutes as needed for chest pain. 21 tablet 12  . oxyCODONE (OXY IR/ROXICODONE) 5 MG immediate release tablet LIMIT one half to one tab by mouth per day or 2-3 times per day if tolerated 90 tablet 0  . pregabalin (LYRICA) 25 MG capsule Limit 1 tab by mouth per day or twice per day if tolerated  NO GABAPENTIN (NEURONTIN) 60 capsule 0  . sildenafil (VIAGRA) 100 MG tablet Take 100 mg by mouth daily as needed for erectile dysfunction.    . simvastatin (ZOCOR) 10 MG tablet Take 10 mg by mouth daily at 6 PM.    . isosorbide mononitrate (IMDUR) 30 MG 24 hr tablet Take 1 tablet (30 mg total) by mouth daily. 30 tablet 11    Current Facility-Administered Medications  Medication Dose Route Frequency Provider Last Rate Last Dose  . bupivacaine (PF) (MARCAINE) 0.25 % injection 30 mL  30 mL Other Once Mohammed Kindle, MD      . ciprofloxacin (CIPRO) IVPB 400 mg  400 mg Intravenous Once Mohammed Kindle, MD      . ciprofloxacin (CIPRO) tablet 250 mg  250 mg Oral BID Mohammed Kindle, MD      . fentaNYL (SUBLIMAZE) injection 100 mcg  100 mcg Intravenous Once Mohammed Kindle, MD      .  lactated ringers infusion 1,000 mL  1,000 mL Intravenous Continuous Mohammed Kindle, MD      . midazolam (VERSED) 5 MG/5ML injection 5 mg  5 mg Intravenous Once Mohammed Kindle, MD      . orphenadrine (NORFLEX) injection 60 mg  60 mg Intramuscular Once Mohammed Kindle, MD      . orphenadrine (NORFLEX) injection 60 mg  60 mg Intramuscular Once Mohammed Kindle, MD      . triamcinolone acetonide (KENALOG-40) injection 40 mg  40 mg Other Once Mohammed Kindle, MD        Allergies:   Amoxicillin; Flexeril; Methadone; Tramadol; and Trazodone and nefazodone   Social History:  The patient  reports that he has been smoking Cigarettes.  He has smoked for the past 35 years. He has never used smokeless tobacco. He reports that he drinks alcohol. He reports that he does not use illicit drugs.   Family History:  The patient's family history includes Cancer in his sister; Diabetes in his other and sister; Heart disease in his father, mother, and other; Hypertension in his father.  ROS:   Review of Systems  Constitutional: Negative for fever, chills, weight loss, malaise/fatigue and diaphoresis.  HENT: Positive for congestion.   Eyes: Negative for discharge and redness.  Respiratory: Positive for shortness of breath. Negative for cough, hemoptysis, sputum production and wheezing.   Cardiovascular: Positive for chest pain. Negative for palpitations, orthopnea, claudication, leg swelling and PND.  Gastrointestinal: Negative for heartburn, nausea, vomiting and abdominal  pain.  Musculoskeletal: Positive for back pain and neck pain. Negative for myalgias and falls.  Skin: Negative for rash.  Neurological: Negative for dizziness, tingling, tremors, sensory change, speech change, focal weakness, loss of consciousness and weakness.  Endo/Heme/Allergies: Does not bruise/bleed easily.  Psychiatric/Behavioral: Negative for substance abuse. The patient is not nervous/anxious.   All other systems reviewed and are negative.     PHYSICAL EXAM:  VS:  BP 150/88 mmHg  Pulse 77  Ht 5\' 7"  (1.702 m)  Wt 167 lb 8 oz (75.978 kg)  BMI 26.23 kg/m2 BMI: Body mass index is 26.23 kg/(m^2). Well nourished, well developed, in no acute distress HEENT: normocephalic, atraumatic Neck: no JVD, carotid bruits or masses Cardiac:  normal S1, S2; RRR; no murmurs, rubs, or gallops Lungs:  clear to auscultation bilaterally, no wheezing, rhonchi or rales Abd: soft, nontender, no hepatomegaly, + BS MS: no deformity or atrophy Ext: no edema, well healed surgical scar inner-half of left lower extremity  Skin: warm and dry, no rash Neuro:  moves all extremities spontaneously, no focal abnormalities noted, follows commands Psych: euthymic mood, full affect   EKG:  Was ordered today. Shows NSR, 77 bpm, bifascicular block, no acute st/t changes   Recent Labs: 02/21/2015: ALT 115*; BUN 8; Creatinine, Ser 0.65; Hemoglobin 14.9; Platelets 95*; Potassium 4.5; Sodium 133*  No results found for requested labs within last 365 days.   CrCl cannot be calculated (Patient has no serum creatinine result on file.).   Wt Readings from Last 3 Encounters:  08/14/15 167 lb 8 oz (75.978 kg)  08/08/15 164 lb 8 oz (74.617 kg)  06/13/15 180 lb (81.647 kg)     Other studies reviewed: Additional studies/records reviewed today include: summarized above  ASSESSMENT AND PLAN:  1. CAD s/p CABG as above: Fleeting, nonspecific chest pain that does not occur with exertion as he is able to walk 2 miles almost  everyday for exercise without any symptoms. Recent [employer] 08/2014 without significant  ischemia, low risk. Add Imdur 30 mg daily. Continue Lopressor 37.5 mg bid. Continue aspirin. Currently without symptoms.   2. SOB/COPD: His SOB is not a new issues for him and certainly may be pulmonary in etiology. Will check an echo to evaluate LV systolic function, wall motion, and right-sided pressure. He has quit back of his tobacco usage to 2-3 cigarettes weekly. If echo shows normal LV systolic function, wall motion, and right-sided pressure, given recent normal nuclear stress test consider pulmonary evaluation.   3. HTN: BP is somewhat elevated today at 150/88. He has yet to take any of his medications. He will take these upon arriving home. Also added Imdur 30 mg daily as above.   4. IDDM: Per PCP.   5. HLD: Lipitor 40 mg daily. Check lipid and liver today. Healthy diet and continued exercise.   Disposition: F/u with Dr. Rockey Situ 1 week s/p echocardiogram   Current medicines are reviewed at length with the patient today.  The patient did not have any concerns regarding medicines.  Melvern Banker PA-C 08/14/2015 3:03 PM     [address] [place], [place] [address] [phone]

## 2015-08-15 LAB — COMPREHENSIVE METABOLIC PANEL
A/G RATIO: 1.8 (ref 1.2–2.2)
ALT: 111 IU/L — ABNORMAL HIGH (ref 0–44)
AST: 78 IU/L — AB (ref 0–40)
Albumin: 4.4 g/dL (ref 3.5–5.5)
Alkaline Phosphatase: 67 IU/L (ref 39–117)
BUN/Creatinine Ratio: 11 (ref 9–20)
BUN: 8 mg/dL (ref 6–24)
Bilirubin Total: 0.7 mg/dL (ref 0.0–1.2)
CALCIUM: 9.4 mg/dL (ref 8.7–10.2)
CO2: 24 mmol/L (ref 18–29)
CREATININE: 0.7 mg/dL — AB (ref 0.76–1.27)
Chloride: 100 mmol/L (ref 96–106)
GFR, EST AFRICAN AMERICAN: 122 mL/min/{1.73_m2} (ref >59)
GFR, EST NON AFRICAN AMERICAN: 105 mL/min/{1.73_m2} (ref >59)
GLOBULIN, TOTAL: 2.4 g/dL (ref 1.5–4.5)
Glucose: 289 mg/dL — ABNORMAL HIGH (ref 65–99)
POTASSIUM: 4.8 mmol/L (ref 3.5–5.2)
SODIUM: 136 mmol/L (ref 134–144)
TOTAL PROTEIN: 6.8 g/dL (ref 6.0–8.5)

## 2015-08-15 LAB — LIPID PANEL
CHOL/HDL RATIO: 3 ratio (ref 0.0–5.0)
Cholesterol, Total: 149 mg/dL (ref 100–199)
HDL: 50 mg/dL (ref >39)
LDL CALC: 78 mg/dL (ref 0–99)
TRIGLYCERIDES: 106 mg/dL (ref 0–149)
VLDL Cholesterol Cal: 21 mg/dL (ref 5–40)

## 2015-08-17 ENCOUNTER — Telehealth: Payer: Self-pay | Admitting: Pain Medicine

## 2015-08-17 NOTE — Telephone Encounter (Signed)
Please advise 

## 2015-08-17 NOTE — Telephone Encounter (Signed)
Calling to see if he can pick up his med scripts from appt on 08-08-15 Has not been approved from [facility] for procedure

## 2015-08-17 NOTE — Telephone Encounter (Signed)
Spoke with Dr. Primus Bravo. Will discuss meds with patient at next appt in May. Patient advised of this, states he cant wait that long and will find another doctor. Wished to cancel the May appt.

## 2015-08-17 NOTE — Telephone Encounter (Signed)
Nurses Patient will need to return for evaluation and we will need results of previous urine drug screen and discuss results of urine drug screen with me prior to scheduling patient for an appointment Please obtain UDS for Kyle [name] to review and we will consider scheduling patient for evaluation at which time we will consider and not promise prescribing medications

## 2015-08-21 ENCOUNTER — Ambulatory Visit: Payer: Medicare HMO | Admitting: Pain Medicine

## 2015-08-23 ENCOUNTER — Encounter: Payer: Self-pay | Admitting: Pain Medicine

## 2015-08-23 ENCOUNTER — Ambulatory Visit: Payer: Medicare HMO | Attending: Pain Medicine | Admitting: Pain Medicine

## 2015-08-23 DIAGNOSIS — R51 Headache: Secondary | ICD-10-CM | POA: Diagnosis present

## 2015-08-23 DIAGNOSIS — M5136 Other intervertebral disc degeneration, lumbar region: Secondary | ICD-10-CM

## 2015-08-23 DIAGNOSIS — M4802 Spinal stenosis, cervical region: Secondary | ICD-10-CM | POA: Insufficient documentation

## 2015-08-23 DIAGNOSIS — M542 Cervicalgia: Secondary | ICD-10-CM | POA: Insufficient documentation

## 2015-08-23 DIAGNOSIS — M47816 Spondylosis without myelopathy or radiculopathy, lumbar region: Secondary | ICD-10-CM

## 2015-08-23 DIAGNOSIS — M5416 Radiculopathy, lumbar region: Secondary | ICD-10-CM

## 2015-08-23 DIAGNOSIS — M5030 Other cervical disc degeneration, unspecified cervical region: Secondary | ICD-10-CM

## 2015-08-23 DIAGNOSIS — M47812 Spondylosis without myelopathy or radiculopathy, cervical region: Secondary | ICD-10-CM

## 2015-08-23 DIAGNOSIS — M5481 Occipital neuralgia: Secondary | ICD-10-CM

## 2015-08-23 MED ORDER — TRIAMCINOLONE ACETONIDE 40 MG/ML IJ SUSP
40.0000 mg | Freq: Once | INTRAMUSCULAR | Status: AC
  Administered 2015-08-23: 40 mg

## 2015-08-23 MED ORDER — TRIAMCINOLONE ACETONIDE 40 MG/ML IJ SUSP
INTRAMUSCULAR | Status: AC
  Administered 2015-08-23: 40 mg
  Filled 2015-08-23: qty 1

## 2015-08-23 MED ORDER — FENTANYL CITRATE (PF) 100 MCG/2ML IJ SOLN
INTRAMUSCULAR | Status: AC
  Administered 2015-08-23: 50 ug via INTRAVENOUS
  Filled 2015-08-23: qty 2

## 2015-08-23 MED ORDER — FENTANYL CITRATE (PF) 100 MCG/2ML IJ SOLN
100.0000 ug | Freq: Once | INTRAMUSCULAR | Status: AC
  Administered 2015-08-23: 50 ug via INTRAVENOUS

## 2015-08-23 MED ORDER — ORPHENADRINE CITRATE 30 MG/ML IJ SOLN
INTRAMUSCULAR | Status: AC
  Administered 2015-08-23: 60 mg
  Filled 2015-08-23: qty 2

## 2015-08-23 MED ORDER — MIDAZOLAM HCL 5 MG/5ML IJ SOLN: 5.0000 mg | Freq: Once | INTRAMUSCULAR | Status: DC

## 2015-08-23 MED ORDER — CIPROFLOXACIN IN D5W 400 MG/200ML IV SOLN: 400.0000 mg | Freq: Once | INTRAVENOUS | Status: DC

## 2015-08-23 MED ORDER — BUPIVACAINE HCL (PF) 0.25 % IJ SOLN
30.0000 mL | Freq: Once | INTRAMUSCULAR | Status: AC
  Administered 2015-08-23: 30 mL

## 2015-08-23 MED ORDER — SODIUM CHLORIDE 0.9% FLUSH: 20.0000 mL | Freq: Once | INTRAVENOUS | Status: DC

## 2015-08-23 MED ORDER — MIDAZOLAM HCL 5 MG/5ML IJ SOLN
INTRAMUSCULAR | Status: AC
  Administered 2015-08-23: 2 mg
  Filled 2015-08-23: qty 5

## 2015-08-23 MED ORDER — LACTATED RINGERS IV SOLN: 1000.0000 mL | INTRAVENOUS | Status: DC

## 2015-08-23 MED ORDER — CIPROFLOXACIN IN D5W 400 MG/200ML IV SOLN
INTRAVENOUS | Status: AC
  Administered 2015-08-23: 400 mg via INTRAVENOUS
  Filled 2015-08-23: qty 200

## 2015-08-23 MED ORDER — CIPROFLOXACIN HCL 250 MG PO TABS
250.0000 mg | ORAL_TABLET | Freq: Two times a day (BID) | ORAL | Status: DC
Start: 2015-08-23 — End: 2015-09-22

## 2015-08-23 MED ORDER — BUPIVACAINE HCL (PF) 0.25 % IJ SOLN
INTRAMUSCULAR | Status: AC
  Administered 2015-08-23: 30 mL
  Filled 2015-08-23: qty 30

## 2015-08-23 NOTE — Progress Notes (Signed)
Safety precautions to be maintained throughout the outpatient stay will include: orient to surroundings, keep bed in low position, maintain call bell within reach at all times, provide assistance with transfer out of bed and ambulation.  

## 2015-08-23 NOTE — Patient Instructions (Addendum)
PLAN   Continue present medications Lyrica and oxycodone as we discussed today Please obtain Cipro antibiotic today and begin taking Cipro antibiotic today as prescribed  F/U PCP C Doss for evaliation of  BP sinus condition dental condition and general medical  condition  F/U surgical evaluation as planned  F/U neurological evaluation. May consider PNCV/EMG studies and other studies pending follow-up evaluations  Dental follow-up evaluation as needed  May consider radiofrequency rhizolysis or intraspinal procedures pending response to present treatment and F/U evaluation   Patient is to call pain management should there be change in condition or should patient have other concerns regarding condition prior to scheduled return appointmentPain Management Discharge Instructions  General Discharge Instructions :  If you need to reach your doctor call: Monday-Friday 8:00 am - 4:00 pm at [phone] or toll free [phone].  After clinic hours [phone] to have operator reach doctor.  Bring all of your medication bottles to all your appointments in the pain clinic.  To cancel or reschedule your appointment with Pain Management please remember to call 24 hours in advance to avoid a fee.  Refer to the educational materials which you have been given on: General Risks, I had my Procedure. Discharge Instructions, Post Sedation.  Post Procedure Instructions:  The drugs you were given will stay in your system until tomorrow, so for the next 24 hours you should not drive, make any legal decisions or drink any alcoholic beverages.  You may eat anything you prefer, but it is better to start with liquids then soups and crackers, and gradually work up to solid foods.  Please notify your doctor immediately if you have any unusual bleeding, trouble breathing or pain that is not related to your normal pain.  Depending on the type of procedure that was done, some parts of your body may feel week  and/or numb.  This usually clears up by tonight or the next day.  Walk with the use of an assistive device or accompanied by an adult for the 24 hours.  You may use ice on the affected area for the first 24 hours.  Put ice in a Ziploc bag and cover with a towel and place against area 15 minutes on 15 minutes off.  You may switch to heat after 24 hours.

## 2015-08-23 NOTE — Progress Notes (Signed)
Subjective:    Patient ID: Kyle [name], male    DOB: Feb 16, 1961, 55 y.o.   MRN: [id number]  HPI  NOTE: The patient is a 55 y.o.-year-old male who returns to the [facility] for further evaluation and treatment of pain consisting of pain involving the region of the neck and headache.  Patient is with prior studies revealing patient to be with MRI evidence of1. 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 There is also mild spinal stenosis at C4-5. 2. Bulging discs and mild facet disease in the lumbar spine but no significant disc protrusions, spinal or foraminal stenosis . Marland Kitchen The patient is with pain radiating from the cervical region toward the occipital region and continuing to the retro-orbital region. 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 The risks, benefits, and expectations of the procedure have been discussed and explained to patient, who is understanding and wishes to proceed with interventional treatment as discussed and as explained to patient.  Will proceed with greater occipital nerve blocks with myoneural block injections at this time as discussed and as explained to patient.  All are understanding and in agreement with suggested treatment plan.    PROCEDURE:  Greater occipital nerve block on the left side with IV Versed, IV Fentanyl, conscious sedation, EKG, blood pressure, pulse, capnography, and pulse oximetry monitoring.  Procedure performed with patient in prone position.  Greater occipital nerve block on the left side.   With patient in prone position, Betadine prep of proposed entry site accomplished.  Following identification of the nuchal ridge, 22 -gauge needle was inserted at the level of the nuchal ridge medial to the occipital artery.  Following negative aspiration, 4cc 0.25% bupivacaine with Kenalog injected for left greater occipital nerve block.  Needle was  removed.  Patient tolerated injection well.   Greater occipital nerve block on the rightt side. The greater occipital nerve block on the right side was performed exactly as the left greater occipital nerve block was performed and utilizing the same technique.   Myoneural block injection of the cervical paraspinal musculature region Following Betadine prep of proposed entry site a 22-gauge needle was inserted into the cervical paraspinal musculature region and following negative aspiration 2 cc of 0.25% bupivacaine with Norflex was injected for myoneural block injection of the cervical paraspinal musculature region 4   A total of 10 mg Kenalog was utilized for the entire procedure.  PLAN:    1. Medications: The patient will  continue presently prescribed medications at this time.. We will consider prescribing Lyrica and oxycodone at time of return appointment as discussed with patient on today's visit 2. Patient to follow up with primary care physician C Doss for evaluation of blood pressure and general medical condition status post procedure performed on today's visit. 3. Neurological evaluation for further assessment of headaches for further studies as discussed. 4. Surgical evaluation as discussed. Has been addressed  5. Patient may be candidate for Botox injections, radiofrequency procedures, as well as implantation type procedures pending response to treatment rendered on today's visit and pending follow-up evaluation. 6. Patient has been advised to adhere to proper body mechanics and to avoid activities which appear to aggravate condition.cations:  Will continue presently prescribed medications at this time. 7. The patient is understanding and in agreement with the suggested treatment plan.   Review of Systems     Objective:   Physical Exam  Assessment & Plan:

## 2015-08-24 ENCOUNTER — Telehealth: Payer: Self-pay | Admitting: *Deleted

## 2015-08-30 ENCOUNTER — Ambulatory Visit: Payer: Medicare HMO | Admitting: Pain Medicine

## 2015-09-04 ENCOUNTER — Other Ambulatory Visit: Payer: Medicare HMO

## 2015-09-05 ENCOUNTER — Ambulatory Visit: Payer: Medicare HMO | Admitting: Pain Medicine

## 2015-09-06 ENCOUNTER — Encounter: Payer: Self-pay | Admitting: Pain Medicine

## 2015-09-06 ENCOUNTER — Ambulatory Visit: Payer: Medicare HMO | Attending: Pain Medicine | Admitting: Pain Medicine

## 2015-09-06 VITALS — BP 131/92 | HR 94 | Temp 98.5°F | Resp 16 | Ht 67.0 in | Wt 170.0 lb

## 2015-09-06 DIAGNOSIS — M62830 Muscle spasm of back: Secondary | ICD-10-CM | POA: Insufficient documentation

## 2015-09-06 DIAGNOSIS — M4802 Spinal stenosis, cervical region: Secondary | ICD-10-CM | POA: Insufficient documentation

## 2015-09-06 DIAGNOSIS — M47812 Spondylosis without myelopathy or radiculopathy, cervical region: Secondary | ICD-10-CM

## 2015-09-06 DIAGNOSIS — M5412 Radiculopathy, cervical region: Secondary | ICD-10-CM | POA: Insufficient documentation

## 2015-09-06 DIAGNOSIS — M5481 Occipital neuralgia: Secondary | ICD-10-CM | POA: Insufficient documentation

## 2015-09-06 DIAGNOSIS — M5416 Radiculopathy, lumbar region: Secondary | ICD-10-CM

## 2015-09-06 DIAGNOSIS — M542 Cervicalgia: Secondary | ICD-10-CM | POA: Diagnosis present

## 2015-09-06 DIAGNOSIS — M5030 Other cervical disc degeneration, unspecified cervical region: Secondary | ICD-10-CM

## 2015-09-06 DIAGNOSIS — M5136 Other intervertebral disc degeneration, lumbar region: Secondary | ICD-10-CM

## 2015-09-06 DIAGNOSIS — M546 Pain in thoracic spine: Secondary | ICD-10-CM | POA: Diagnosis present

## 2015-09-06 DIAGNOSIS — M5126 Other intervertebral disc displacement, lumbar region: Secondary | ICD-10-CM | POA: Insufficient documentation

## 2015-09-06 DIAGNOSIS — M47816 Spondylosis without myelopathy or radiculopathy, lumbar region: Secondary | ICD-10-CM

## 2015-09-06 DIAGNOSIS — M79606 Pain in leg, unspecified: Secondary | ICD-10-CM | POA: Diagnosis present

## 2015-09-06 MED ORDER — PREGABALIN 25 MG PO CAPS: ORAL_CAPSULE | ORAL | Status: DC

## 2015-09-06 MED ORDER — OXYCODONE HCL 5 MG PO TABS: ORAL_TABLET | ORAL | Status: DC

## 2015-09-06 NOTE — Progress Notes (Signed)
   Subjective:    Patient ID: Kyle [name], male    DOB: [birth year], 55 y.o.   MRN: [id number]  HPI  The patient is a 55 year old gentleman who returns to pain management for further evaluation and treatment of pain involving the neck entire back upper and lower extremity regions. The patient states that he has pain involving the lower back and lower extremity region of severe degree at this time. The patient states that the pain is aggravated by standing walking and becoming more intense as patient spends more time on the feet. The patient denies any recent trauma change in events of daily living because change in symptomatology. The patient states that the lower back virtually pain increases with twisting and turning as well. We discussed patient's condition and will proceed with lumbar facet, medial branch nerve, blocks at time of return appointment in attempt to decrease severity of symptoms, minimize progression of symptoms, and avoid the need for more involved treatment. The patient was with understanding and agreed to suggested treatment plan      Review of Systems     Objective:   Physical Exam  There was mild to moderate tenderness of the splenius capitis and occipitalis musculature regions. Palpation over the cervical facet cervical paraspinal musculature region reproduced mild to moderate discomfort. There was tenderness over the acromioclavicular and glenohumeral joint regions a moderate degree and patient appeared to be with unremarkable Spurling's maneuver. Tinel and Phalen's maneuver were without increased pain of significant degree. Patient appeared to be with slightly decreased grip strength. Palpation over the thoracic region thoracic facet region was attends to palpation of moderate degree with moderate muscle spasms involving thoracic region with no crepitus of the thoracic region noted. Palpation over the lumbar paraspinal musculatures and lumbar facet region associated  with severe discomfort with lateral bending rotation extension and palpation of the lumbar facets reproducing severe pain. There was severe tenderness over the PSIS and PII S region as well as the gluteal and piriformis musculature region. Straight leg raising was tolerates approximately 30 without increased pain with dorsiflexion noted. No definite sensory deficit or dermatomal distribution detected. There was mild tenderness of the greater trochanteric region iliotibial band region. EHL strength appeared to be decreased. Negative clonus negative Homans. Abdomen nontender with no costovertebral tenderness noted    Assessment & Plan:    Bilateral occipital neuralgia  1. 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 Is also mild spinal stenosis at C4-5. 2. Bulging discs and mild facet disease in the lumbar spine but no significant disc protrusions, spinal or foraminal stenosis  Cervical facet syndrome  Cervical radiculopathy  MRI  of the lumbar region demonstrates bulging disks with facet degenerative changes  Lumbar facet syndrome    PLAN   Continue present medications Lyrica and oxycodone   Lumbar facet, medial branch nerve, blocks to be performed at time of return appointment  F/U PCP C Doss for evaliation of  BP sinus condition dental condition and general medical  condition  F/U surgical evaluation as planned. Patient is scheduled for neurosurgical evaluation next week  F/U neurological evaluation. May consider pending follow-up evaluations  Dental follow-up evaluation as needed  May consider radiofrequency rhizolysis or intraspinal procedures pending response to present treatment and F/U evaluation   Patient is to call pain management should there be change in condition or should patient have other concerns regarding condition prior to scheduled return appointment

## 2015-09-06 NOTE — Progress Notes (Signed)
Patient here for medication management Safety precautions to be maintained throughout the outpatient stay will include: orient to surroundings, keep bed in low position, maintain call bell within reach at all times, provide assistance with transfer out of bed and ambulation.  

## 2015-09-06 NOTE — Patient Instructions (Addendum)
PLAN   Continue present medications Lyrica and oxycodone   Lumbar facet, medial branch nerve, blocks to be performed at time of return appointment  F/U PCP C Doss for evaliation of  BP sinus condition dental condition and general medical  condition  F/U surgical evaluation as planned. Patient is scheduled for neurosurgical evaluation next week  F/U neurological evaluation. May consider pending follow-up evaluations  Dental follow-up evaluation as needed  May consider radiofrequency rhizolysis or intraspinal procedures pending response to present treatment and F/U evaluation   Patient is to call pain management should there be change in condition or should patient have other concerns regarding condition prior to scheduled return appointment

## 2015-09-11 ENCOUNTER — Encounter: Payer: Self-pay | Admitting: *Deleted

## 2015-09-11 ENCOUNTER — Other Ambulatory Visit: Payer: Medicare HMO

## 2015-09-18 ENCOUNTER — Ambulatory Visit: Payer: Medicare HMO | Admitting: Cardiovascular Disease

## 2015-09-20 ENCOUNTER — Ambulatory Visit: Payer: Medicare HMO | Admitting: Pain Medicine

## 2015-09-20 ENCOUNTER — Emergency Department: Payer: Medicare HMO

## 2015-09-20 ENCOUNTER — Encounter: Payer: Self-pay | Admitting: *Deleted

## 2015-09-20 ENCOUNTER — Emergency Department
Admission: EM | Admit: 2015-09-20 | Discharge: 2015-09-20 | Disposition: A | Discharge status: Home / Self Care | Payer: Medicare HMO | Attending: Emergency Medicine | Admitting: Emergency Medicine

## 2015-09-20 ENCOUNTER — Other Ambulatory Visit: Payer: Self-pay | Admitting: Neurosurgery

## 2015-09-20 DIAGNOSIS — Z7982 Long term (current) use of aspirin: Secondary | ICD-10-CM | POA: Insufficient documentation

## 2015-09-20 DIAGNOSIS — J45909 Unspecified asthma, uncomplicated: Secondary | ICD-10-CM | POA: Diagnosis not present

## 2015-09-20 DIAGNOSIS — Z955 Presence of coronary angioplasty implant and graft: Secondary | ICD-10-CM | POA: Diagnosis not present

## 2015-09-20 DIAGNOSIS — Z7984 Long term (current) use of oral hypoglycemic drugs: Secondary | ICD-10-CM | POA: Insufficient documentation

## 2015-09-20 DIAGNOSIS — R0789 Other chest pain: Secondary | ICD-10-CM | POA: Insufficient documentation

## 2015-09-20 DIAGNOSIS — M5030 Other cervical disc degeneration, unspecified cervical region: Secondary | ICD-10-CM | POA: Insufficient documentation

## 2015-09-20 DIAGNOSIS — R079 Chest pain, unspecified: Secondary | ICD-10-CM

## 2015-09-20 DIAGNOSIS — E119 Type 2 diabetes mellitus without complications: Secondary | ICD-10-CM | POA: Diagnosis not present

## 2015-09-20 DIAGNOSIS — E785 Hyperlipidemia, unspecified: Secondary | ICD-10-CM | POA: Insufficient documentation

## 2015-09-20 DIAGNOSIS — R0981 Nasal congestion: Secondary | ICD-10-CM | POA: Diagnosis not present

## 2015-09-20 DIAGNOSIS — Z87891 Personal history of nicotine dependence: Secondary | ICD-10-CM | POA: Insufficient documentation

## 2015-09-20 DIAGNOSIS — M545 Low back pain, unspecified: Secondary | ICD-10-CM

## 2015-09-20 DIAGNOSIS — I10 Essential (primary) hypertension: Secondary | ICD-10-CM | POA: Diagnosis not present

## 2015-09-20 DIAGNOSIS — Z794 Long term (current) use of insulin: Secondary | ICD-10-CM | POA: Insufficient documentation

## 2015-09-20 DIAGNOSIS — I2510 Atherosclerotic heart disease of native coronary artery without angina pectoris: Secondary | ICD-10-CM | POA: Diagnosis not present

## 2015-09-20 DIAGNOSIS — J449 Chronic obstructive pulmonary disease, unspecified: Secondary | ICD-10-CM | POA: Diagnosis not present

## 2015-09-20 DIAGNOSIS — M5136 Other intervertebral disc degeneration, lumbar region: Secondary | ICD-10-CM | POA: Diagnosis not present

## 2015-09-20 DIAGNOSIS — Z79899 Other long term (current) drug therapy: Secondary | ICD-10-CM | POA: Insufficient documentation

## 2015-09-20 LAB — CBC WITH DIFFERENTIAL/PLATELET
BASOS ABS: 0.1 10*3/uL (ref 0–0.1)
BASOS PCT: 1 %
Eosinophils Absolute: 0.3 10*3/uL (ref 0–0.7)
Eosinophils Relative: 4 %
HEMATOCRIT: 44.9 % (ref 40.0–52.0)
HEMOGLOBIN: 14.4 g/dL (ref 13.0–18.0)
Lymphocytes Relative: 45 %
Lymphs Abs: 3.6 10*3/uL (ref 1.0–3.6)
MCH: 28 pg (ref 26.0–34.0)
MCHC: 32 g/dL (ref 32.0–36.0)
MCV: 87.3 fL (ref 80.0–100.0)
Monocytes Absolute: 0.7 10*3/uL (ref 0.2–1.0)
Monocytes Relative: 9 %
NEUTROS ABS: 3.3 10*3/uL (ref 1.4–6.5)
NEUTROS PCT: 41 %
Platelets: 136 10*3/uL — ABNORMAL LOW (ref 150–440)
RBC: 5.14 MIL/uL (ref 4.40–5.90)
RDW: 13.7 % (ref 11.5–14.5)
WBC: 7.9 10*3/uL (ref 3.8–10.6)

## 2015-09-20 LAB — COMPREHENSIVE METABOLIC PANEL
ALBUMIN: 4 g/dL (ref 3.5–5.0)
ALT: 78 U/L — ABNORMAL HIGH (ref 17–63)
ANION GAP: 7 (ref 5–15)
AST: 45 U/L — ABNORMAL HIGH (ref 15–41)
Alkaline Phosphatase: 42 U/L (ref 38–126)
BILIRUBIN TOTAL: 0.9 mg/dL (ref 0.3–1.2)
BUN: 9 mg/dL (ref 6–20)
CHLORIDE: 105 mmol/L (ref 101–111)
CO2: 25 mmol/L (ref 22–32)
Calcium: 9 mg/dL (ref 8.9–10.3)
Creatinine, Ser: 0.59 mg/dL — ABNORMAL LOW (ref 0.61–1.24)
GFR calc Af Amer: >60 mL/min (ref >60)
Glucose, Bld: 142 mg/dL — ABNORMAL HIGH (ref 65–99)
POTASSIUM: 3.9 mmol/L (ref 3.5–5.1)
Sodium: 137 mmol/L (ref 135–145)
TOTAL PROTEIN: 6.8 g/dL (ref 6.5–8.1)

## 2015-09-20 LAB — URINALYSIS COMPLETE WITH MICROSCOPIC (ARMC ONLY)
BACTERIA UA: NONE SEEN
Bilirubin Urine: NEGATIVE
Glucose, UA: 50 mg/dL — AB
Hgb urine dipstick: NEGATIVE
Ketones, ur: NEGATIVE mg/dL
Leukocytes, UA: NEGATIVE
Nitrite: NEGATIVE
PH: 5 (ref 5.0–8.0)
PROTEIN: NEGATIVE mg/dL
SQUAMOUS EPITHELIAL / LPF: NONE SEEN
Specific Gravity, Urine: 1.018 (ref 1.005–1.030)

## 2015-09-20 LAB — TROPONIN I

## 2015-09-20 NOTE — ED Provider Notes (Signed)
[facility] Emergency Department Provider Note   ____________________________________________  Time seen: Approximately 11:05 AM I have reviewed the triage vital signs and the triage nursing note.  HISTORY  Chief Complaint Sinus Problem and Back Pain   Historian Patient  HPI Kyle [name] is a 55 y.o. male with a history of COPD, diabetes, degenerative disc disease with what sounds like cervical radiculopathy for which she is supposed to be having surgery but was put off temporarily due to the insurance company has not approved payment yet, as well as chronic low back pain, who is here with multiple complaints. He states the last 3 or 4 days he's had a lot of sinus/nasal congestion with drainage down his throat. He states that he's felt hot overnight, but has not checked his temperature to see if he had a fever. He thinks he is on a nasal spray that is a steroid. He last saw his primary care physician, Dr. Brynda Greathouse he thinks in February. He did not call his primary provider's office this morning, but states he does need to make a follow-up appointment there.  He's had a mild nonproductive cough. He has not felt short of breath or had palpitations. He reports some chest pressure that he experienced overnight. He does have a history of four-vessel CABG.  Additionally he is also complaining of several days of acute on chronic low back pain. This back pain seems a little bit different than his chronic low back pain in that it is persistent, and is now wrapping around both sides from his mid low back around to his abdomen and right side greater than left side but really both sides. He reports no new overuse or traumatic injury. He does have a history of degenerative disc disease and reports that he actually is going to be having neck surgery as soon as its approved by insurance.  No pain running down his legs. No urinary or bowel incontinence. No sensory changes. No  abdominal pain.    Past Medical History  Diagnosis Date  . Asthma   . DM2 (diabetes mellitus, type 2) ([place])   . COPD (chronic obstructive pulmonary disease) ([place])   . Fainting spell   . GERD (gastroesophageal reflux disease)   . Allergy   . Hyperlipidemia with target LDL less than 70   . Hypertension   . Urine incontinence   . Coronary artery disease involving native coronary artery     a. 4 vessel CABG 10/2012 at [place]; b. [facility] 08/2014: small defect of mild severity present along apical lateral & apex location, no st segment changes,  LV function 45-54%, poor gated images lead to poor EF & wall motion assessment, low risk study  . S/P CABG x 4     a. LIMA-LAD, SVG-R[place], SVG-OM,SVG-dRCA  . Chronic back pain 1981  . DDD (degenerative disc disease)   . Hepatitis C     2/2 tattoo  . Polysubstance abuse     a. ongoing daily tobacco abuse and etoh (on the weekends), remote crack/cocaine abuse last used approximately in 2006    Patient Active Problem List   Diagnosis Date Noted  . DDD (degenerative disc disease), lumbar 01/10/2015  . Facet syndrome, lumbar 01/10/2015  . Lumbar radiculopathy 01/10/2015  . DDD (degenerative disc disease), cervical 11/22/2014  . Cervical facet syndrome 11/22/2014  . Bilateral occipital neuralgia 11/22/2014  . Hospital discharge follow-up 10/16/2014  . Coronary artery disease involving native coronary artery   .  S/P CABG x 4   . Chest pain with moderate risk for cardiac etiology 09/27/2014  . Cervicalgia 07/05/2014  . Acute bacterial rhinosinusitis 06/15/2014  . Non compliance w medication regimen 06/07/2014  . Chest tightness 06/07/2014  . Tobacco abuse counseling 01/05/2014  . Vitamin D deficiency 01/05/2014  . Transaminitis 01/05/2014  . Temporary low platelet count ([place]) 01/05/2014  . Diabetes mellitus type 2, uncontrolled ([place]) 01/05/2014  . Back pain 10/01/2013  . Need for prophylactic vaccination with  tetanus-diphtheria (TD) 10/01/2013  . Penile lesion 10/01/2013  . Essential hypertension 08/29/2013  . Hyperlipidemia with target LDL less than 70 08/29/2013  . Callus of foot 08/29/2013  . Blurred vision 08/29/2013  . Itching 08/29/2013  . Exacerbation of chronic back pain 06/07/2013  . Hyperlipidemia 03/02/2013  . Former smoker 03/02/2013  . Routine general medical examination at a health care facility 02/24/2013  . Hepatitis C 02/24/2013  . Diabetes type 2, uncontrolled ([place]) 02/24/2013    Past Surgical History  Procedure Laterality Date  . Coronary artery bypass graft  8/14    CABG x 4; LIMA-LAD, SVG-RI, SVG-OM1, SVG-rPDA (Atretic RIMA).  . Cardiac catheterization  11/2012    [facility] - Multivessel CAD  . Neck injection      Current Outpatient Rx  Name  Route  Sig  Dispense  Refill  . albuterol (PROVENTIL HFA;VENTOLIN HFA) 108 (90 BASE) MCG/ACT inhaler   Inhalation   Inhale 2 puffs into the lungs as needed. Patient taking differently: Inhale 2 puffs into the lungs every 6 (six) hours as needed for wheezing or shortness of breath.    1 Inhaler   2   . aspirin 325 MG tablet   Oral   Take 1 tablet (325 mg total) by mouth daily.   30 tablet   5   . atorvastatin (LIPITOR) 40 MG tablet   Oral   Take 1 tablet (40 mg total) by mouth daily at 6 PM. Patient taking differently: Take 40 mg by mouth daily as needed (high cholesterol).    30 tablet   0   . ciprofloxacin (CIPRO) 250 MG tablet   Oral   Take 1 tablet (250 mg total) by mouth 2 (two) times daily. Patient not taking: Reported on 09/06/2015   14 tablet   0   . fexofenadine-pseudoephedrine (ALLEGRA-D) 60-120 MG 12 hr tablet   Oral   Take 1 tablet by mouth 2 (two) times daily.   30 tablet   0   . glucose blood test strip      OneTouch ultra mini test strips. Use 4 times daily. Dx 250.0   100 each   5   . insulin NPH-regular Human (NOVOLIN 70/30) (70-30) 100 UNIT/ML injection      32 units in  the AM and 22 units with evening meal         . Insulin Syringes, Disposable, U-100 1 ML MISC      To inject 70/30 bid   100 each   6   . isosorbide mononitrate (IMDUR) 30 MG 24 hr tablet   Oral   Take 1 tablet (30 mg total) by mouth daily.   30 tablet   11   . LANCETS ULTRA FINE MISC      To check blood sugar four times daily   100 each   6   . metFORMIN (GLUCOPHAGE) 1000 MG tablet   Oral   Take 1 tablet (1,000 mg total) by mouth 2 (two)  times daily with a meal.   60 tablet   5   . methocarbamol (ROBAXIN) 750 MG tablet   Oral   Take 1 tablet (750 mg total) by mouth 3 (three) times daily as needed for muscle spasms.   45 tablet   1   . metoprolol tartrate (LOPRESSOR) 25 MG tablet   Oral   Take 1.5 tablets (37.5 mg total) by mouth 2 (two) times daily.   135 tablet   3   . nitroGLYCERIN (NITROSTAT) 0.4 MG SL tablet   Sublingual   Place 1 tablet (0.4 mg total) under the tongue every 5 (five) minutes as needed for chest pain.   21 tablet   12   . oxyCODONE (OXY IR/ROXICODONE) 5 MG immediate release tablet      LIMIT one half to one tab by mouth per day or twice per day if tolerated Patient not taking: Reported on 09/06/2015   60 tablet   0   . pregabalin (LYRICA) 25 MG capsule      Limit 1 tab by mouth per day or twice per day if tolerated  NO GABAPENTIN (NEURONTIN)   60 capsule   0   . sildenafil (VIAGRA) 100 MG tablet   Oral   Take 100 mg by mouth daily as needed for erectile dysfunction.         . simvastatin (ZOCOR) 10 MG tablet   Oral   Take 10 mg by mouth daily at 6 PM.           Allergies Amoxicillin; Flexeril; Methadone; Tramadol; and Trazodone and nefazodone  Family History  Problem Relation Age of Onset  . Heart disease Mother   . Heart disease Father   . Hypertension Father   . Diabetes Sister   . Cancer Sister     skin cancer  . Diabetes Other   . Heart disease Other     Social History Social History  Substance Use  Topics  . Smoking status: Former Smoker -- 35 years    Types: Cigarettes    Quit date: 11/24/2012  . Smokeless tobacco: Never Used  . Alcohol Use: 0.0 oz/week    0 Standard drinks or equivalent per week     Comment: occasional    Review of Systems  Constitutional: Questionable subjective fevers. Eyes: Negative for visual changes. ENT: Positive for sore throat. Positive for nasal congestion and sinus drainage. Cardiovascular: No pleuritic chest pain. This chest pressure overnight, gone by the time he woke up in the morning.Marland Kitchen Respiratory: Negative for shortness of breath. Positive for intermittent, but nonproductive cough. Gastrointestinal: Negative for abdominal pain, vomiting and diarrhea. Genitourinary: Negative for dysuria. Musculoskeletal: Positive for back pain as per history of present illness. Skin: Negative for rash. Neurological: Negative for headache. 10 point Review of Systems otherwise negative ____________________________________________   PHYSICAL EXAM:  VITAL SIGNS: ED Triage Vitals  Enc Vitals Group     BP 09/20/15 0900 141/96 mmHg     Pulse Rate 09/20/15 0900 81     Resp 09/20/15 0900 20     Temp 09/20/15 0900 98.2 F (36.8 C)     Temp Source 09/20/15 0900 Oral     SpO2 09/20/15 0900 98 %     Weight 09/20/15 0900 160 lb (72.576 kg)     Height 09/20/15 0900 5\' 7"  (1.702 m)     Head Cir --      Peak Flow --      Pain Score 09/20/15 0916 6  Pain Loc --      Pain Edu? --      Excl. in [place]? --      Constitutional: Alert and oriented. Well appearing and in no distress. HEENT   Head: Normocephalic and atraumatic.      Eyes: Conjunctivae are normal. PERRL. Normal extraocular movements.      Ears:         Nose: Moderate nasal congestion.   Mouth/Throat: Mucous membranes are moist. Moderate pharyngeal erythema.   Neck: No stridor. No lymphadenopathy Cardiovascular/Chest: Normal rate, regular rhythm.  No murmurs, rubs, or gallops. Respiratory:  Normal respiratory effort without tachypnea nor retractions. Breath sounds are clear and equal bilaterally. No wheezes/rales, mild rhonchi bilateral bases. Gastrointestinal: Soft. No distention, no guarding, no rebound. Nontender.    Genitourinary/rectal:Deferred Musculoskeletal: Back normal in appearance. No point tenderness along the T or L-spine, but pretty significant tenderness to palpation along the paraspinous musculature which is tight muscle spasm bilaterally. Pelvis and hips and other extremities are stable and normal.  No lower extremity tenderness.  No edema. Neurologic:  Normal speech and language. No gross or focal neurologic deficits are appreciated. Skin:  Skin is warm, dry and intact. No rash noted. Psychiatric: Mood and affect are normal. Speech and behavior are normal. Patient exhibits appropriate insight and judgment.  ____________________________________________   EKG I, Lisa Roca, MD, the attending physician have personally viewed and interpreted all ECGs.  64 bpm. Normal sinus rhythm. Right bundle branch block and left anterior fascicular block. Nonspecific ST/T-wave ____________________________________________  LABS (pertinent positives/negatives)  Labs Reviewed  URINALYSIS COMPLETEWITH MICROSCOPIC ([facility] ONLY) - Abnormal; Notable for the following:    Color, Urine YELLOW (*)    APPearance CLEAR (*)    Glucose, UA 50 (*)    All other components within normal limits  COMPREHENSIVE METABOLIC PANEL - Abnormal; Notable for the following:    Glucose, Bld 142 (*)    Creatinine, Ser 0.59 (*)    AST 45 (*)    ALT 78 (*)    All other components within normal limits  CBC WITH DIFFERENTIAL/PLATELET - Abnormal; Notable for the following:    Platelets 136 (*)    All other components within normal limits  TROPONIN I     ____________________________________________  RADIOLOGY All Xrays were viewed by me. Imaging interpreted by Radiologist.  Chest x-ray: Negative  for pneumonia  Thoracic two-view and lumbar 2 view: No acute osseous abnormalities __________________________________________  PROCEDURES  Procedure(s) performed: None  Critical Care performed: None  ____________________________________________   ED COURSE / ASSESSMENT AND PLAN  Pertinent labs & imaging results that were available during my care of the patient were reviewed by me and considered in my medical decision making (see chart for details).   In terms of the patient's two main complaints, the sinus congestion sounds consistent either with allergies or viral URI. He is not febrile, is well appearing overall, and I am not suspicious of bacterial sinusitis.  I will recommend conservative treatment with over-the-counter measures for symptomatic relief.  I am going to check an EKG and labs given that he complained of nonspecific chest pain/chest pressure overnight and he does have a CABG history, although I think it is unlikely any of his symptoms are due to acute coronary syndrome.  I am going out on a chest x-ray is he's had nonproductive cough and complained of subjective fevers to ensure no infiltrate.  In terms of the low back pain, based on his history of prior degenerative  disc disease and cervical issues bad enough to indicate surgery, I'm most suspicious that his bilateral low back pains but right greater than left are probably due to radiculopathy. I don't think he is having an intra-abdominal or vascular emergency. He does not have a true neurologic deficit nor neurologic complaints.  I'm unclear whether or not he's had imaging to his back, and I'm going go ahead and obtain plain films given his age.   Labs, chest x-ray, thoracic and lumbar x-rays are all reassuring.  Because of his history, I am recommending he follow back up with either his cardiologist or as referred to Dr. Ellyn Hack.  In terms of his low back pain, I'm asking him to follow-up with his primary care  physician or back specialist. I'm not going to add any additional pain medications as he is already under chronic pain management.  In terms of the sinus congestion, I suspect either allergies and/or viral exacerbation. I discussed with him symptomatic over-the-counter medications.   We discussed return precautions with regard to all of these complaints.    CONSULTATIONS:   None   Patient / Family / Caregiver informed of clinical course, medical decision-making process, and agree with plan.   I discussed return precautions, follow-up instructions, and discharged instructions with patient and/or family.   ___________________________________________   FINAL CLINICAL IMPRESSION(S) / ED DIAGNOSES   Final diagnoses:  Sinus congestion  Chest pain, unspecified  Bilateral low back pain without sciatica              Note: This dictation was prepared with Dragon dictation. Any transcriptional errors that result from this process are unintentional   Lisa Roca, MD 09/20/15 1318

## 2015-09-20 NOTE — Discharge Instructions (Signed)
In terms of your sinus congestion, I'm recommending over-the-counter Flonase if you are not already on a nasal steroid to help with nasal/allergy inflammation, as well as guaifenesin/Mucinex to help with mucus congestion.  In terms of the overnight chest discomfort, I'm recommending he follow-up with a cardiologist, and the on-call physician's number has been provided.  In terms of your lower back pain wrapping around to the front, as we discussed, your exam and evaluation are reassuring in the emergency department today. I am most suspicious that you have muscle spasm causing ongoing back pain, or possibly bulging disc/pinched nerve. I am recommending that he follow-up with her primary care physician to discuss possible additional workup including possible MRI of the spine.  Return to the emergency department for any new or worsening condition including any weakness or numbness, worsening back pain, any abdominal pain, vomiting blood, black or bloody stools, dizziness or passing out, chest pain or trouble breathing, confusion or altered mental status, or any other symptoms concerning to you.   Back Pain, Adult Back pain is very common in adults.The cause of back pain is rarely dangerous and the pain often gets better over time.The cause of your back pain may not be known. Some common causes of back pain include:  Strain of the muscles or ligaments supporting the spine.  Wear and tear (degeneration) of the spinal disks.  Arthritis.  Direct injury to the back. For many people, back pain may return. Since back pain is rarely dangerous, most people can learn to manage this condition on their own. HOME CARE INSTRUCTIONS Watch your back pain for any changes. The following actions may help to lessen any discomfort you are feeling:  Remain active. It is stressful on your back to sit or stand in one place for long periods of time. Do not sit, drive, or stand in one place for more than 30 minutes at  a time. Take short walks on even surfaces as soon as you are able.Try to increase the length of time you walk each day.  Exercise regularly as directed by your health care provider. Exercise helps your back heal faster. It also helps avoid future injury by keeping your muscles strong and flexible.  Do not stay in bed.Resting more than 1-2 days can delay your recovery.  Pay attention to your body when you bend and lift. The most comfortable positions are those that put less stress on your recovering back. Always use proper lifting techniques, including:  Bending your knees.  Keeping the load close to your body.  Avoiding twisting.  Find a comfortable position to sleep. Use a firm mattress and lie on your side with your knees slightly bent. If you lie on your back, put a pillow under your knees.  Avoid feeling anxious or stressed.Stress increases muscle tension and can worsen back pain.It is important to recognize when you are anxious or stressed and learn ways to manage it, such as with exercise.  Take medicines only as directed by your health care provider. Over-the-counter medicines to reduce pain and inflammation are often the most helpful.Your health care provider may prescribe muscle relaxant drugs.These medicines help dull your pain so you can more quickly return to your normal activities and healthy exercise.  Apply ice to the injured area:  Put ice in a plastic bag.  Place a towel between your skin and the bag.  Leave the ice on for 20 minutes, 2-3 times a day for the first 2-3 days. After that, ice and  heat may be alternated to reduce pain and spasms.  Maintain a healthy weight. Excess weight puts extra stress on your back and makes it difficult to maintain good posture. SEEK MEDICAL CARE IF:  You have pain that is not relieved with rest or medicine.  You have increasing pain going down into the legs or buttocks.  You have pain that does not improve in one  week.  You have night pain.  You lose weight.  You have a fever or chills. SEEK IMMEDIATE MEDICAL CARE IF:   You develop new bowel or bladder control problems.  You have unusual weakness or numbness in your arms or legs.  You develop nausea or vomiting.  You develop abdominal pain.  You feel faint.   This information is not intended to replace advice given to you by your health care provider. Make sure you discuss any questions you have with your health care provider.   Document Released: 04/15/2005 Document Revised: 05/06/2014 Document Reviewed: 08/17/2013 Elsevier Interactive Patient Education 2016 [employer]  Chronic Back Pain  When back pain lasts longer than 3 months, it is called chronic back pain.People with chronic back pain often go through certain periods that are more intense (flare-ups).  CAUSES Chronic back pain can be caused by wear and tear (degeneration) on different structures in your back. These structures include:  The bones of your spine (vertebrae) and the joints surrounding your spinal cord and nerve roots (facets).  The strong, fibrous tissues that connect your vertebrae (ligaments). Degeneration of these structures may result in pressure on your nerves. This can lead to constant pain. HOME CARE INSTRUCTIONS  Avoid bending, heavy lifting, prolonged sitting, and activities which make the problem worse.  Take brief periods of rest throughout the day to reduce your pain. Lying down or standing usually is better than sitting while you are resting.  Take over-the-counter or prescription medicines only as directed by your caregiver. SEEK IMMEDIATE MEDICAL CARE IF:   You have weakness or numbness in one of your legs or feet.  You have trouble controlling your bladder or bowels.  You have nausea, vomiting, abdominal pain, shortness of breath, or fainting.   This information is not intended to replace advice given to you by your health care provider.  Make sure you discuss any questions you have with your health care provider.   Document Released: 05/23/2004 Document Revised: 07/08/2011 Document Reviewed: 10/03/2014 Elsevier Interactive Patient Education 2016 Else[employer] Inc.  Nonspecific Chest Pain  Chest pain can be caused by many different conditions. There is always a chance that your pain could be related to something serious, such as a heart attack or a blood clot in your lungs. Chest pain can also be caused by conditions that are not life-threatening. If you have chest pain, it is very important to follow up with your health care provider. CAUSES  Chest pain can be caused by:  Heartburn.  Pneumonia or bronchitis.  Anxiety or stress.  Inflammation around your heart (pericarditis) or lung (pleuritis or pleurisy).  A blood clot in your lung.  A collapsed lung (pneumothorax). It can develop suddenly on its own (spontaneous pneumothorax) or from trauma to the chest.  Shingles infection (varicella-zoster virus).  Heart attack.  Damage to the bones, muscles, and cartilage that make up your chest wall. This can include:  Bruised bones due to injury.  Strained muscles or cartilage due to frequent or repeated coughing or overwork.  Fracture to one or more ribs.  Sore  cartilage due to inflammation (costochondritis). RISK FACTORS  Risk factors for chest pain may include:  Activities that increase your risk for trauma or injury to your chest.  Respiratory infections or conditions that cause frequent coughing.  Medical conditions or overeating that can cause heartburn.  Heart disease or family history of heart disease.  Conditions or health behaviors that increase your risk of developing a blood clot.  Having had chicken pox (varicella zoster). SIGNS AND SYMPTOMS Chest pain can feel like:  Burning or tingling on the surface of your chest or deep in your chest.  Crushing, pressure, aching, or squeezing pain.  Dull or  sharp pain that is worse when you move, cough, or take a deep breath.  Pain that is also felt in your back, neck, shoulder, or arm, or pain that spreads to any of these areas. Your chest pain may come and go, or it may stay constant. DIAGNOSIS Lab tests or other studies may be needed to find the cause of your pain. Your health care provider may have you take a test called an ambulatory ECG (electrocardiogram). An ECG records your heartbeat patterns at the time the test is performed. You may also have other tests, such as:  Transthoracic echocardiogram (TTE). During echocardiography, sound waves are used to create a picture of all of the heart structures and to look at how blood flows through your heart.  Transesophageal echocardiogram (TEE).This is a more advanced imaging test that obtains images from inside your body. It allows your health care provider to see your heart in finer detail.  Cardiac monitoring. This allows your health care provider to monitor your heart rate and rhythm in real time.  Holter monitor. This is a portable device that records your heartbeat and can help to diagnose abnormal heartbeats. It allows your health care provider to track your heart activity for several days, if needed.  Stress tests. These can be done through exercise or by taking medicine that makes your heart beat more quickly.  Blood tests.  Imaging tests. TREATMENT  Your treatment depends on what is causing your chest pain. Treatment may include:  Medicines. These may include:  Acid blockers for heartburn.  Anti-inflammatory medicine.  Pain medicine for inflammatory conditions.  Antibiotic medicine, if an infection is present.  Medicines to dissolve blood clots.  Medicines to treat coronary artery disease.  Supportive care for conditions that do not require medicines. This may include:  Resting.  Applying heat or cold packs to injured areas.  Limiting activities until pain  decreases. HOME CARE INSTRUCTIONS  If you were prescribed an antibiotic medicine, finish it all even if you start to feel better.  Avoid any activities that bring on chest pain.  Do not use any tobacco products, including cigarettes, chewing tobacco, or electronic cigarettes. If you need help quitting, ask your health care provider.  Do not drink alcohol.  Take medicines only as directed by your health care provider.  Keep all follow-up visits as directed by your health care provider. This is important. This includes any further testing if your chest pain does not go away.  If heartburn is the cause for your chest pain, you may be told to keep your head raised (elevated) while sleeping. This reduces the chance that acid will go from your stomach into your esophagus.  Make lifestyle changes as directed by your health care provider. These may include:  Getting regular exercise. Ask your health care provider to suggest some activities that are safe  for you.  Eating a heart-healthy diet. A registered dietitian can help you to learn healthy eating options.  Maintaining a healthy weight.  Managing diabetes, if necessary.  Reducing stress. SEEK MEDICAL CARE IF:  Your chest pain does not go away after treatment.  You have a rash with blisters on your chest.  You have a fever. SEEK IMMEDIATE MEDICAL CARE IF:   Your chest pain is worse.  You have an increasing cough, or you cough up blood.  You have severe abdominal pain.  You have severe weakness.  You faint.  You have chills.  You have sudden, unexplained chest discomfort.  You have sudden, unexplained discomfort in your arms, back, neck, or jaw.  You have shortness of breath at any time.  You suddenly start to sweat, or your skin gets clammy.  You feel nauseous or you vomit.  You suddenly feel light-headed or dizzy.  Your heart begins to beat quickly, or it feels like it is skipping beats. These symptoms may  represent a serious problem that is an emergency. Do not wait to see if the symptoms will go away. Get medical help right away. Call your local emergency services (911 in the [place] Do not drive yourself to the hospital.   This information is not intended to replace advice given to you by your health care provider. Make sure you discuss any questions you have with your health care provider.   Document Released: 01/23/2005 Document Revised: 05/06/2014 Document Reviewed: 11/19/2013 Elsevier Interactive Patient Education [employer].

## 2015-09-20 NOTE — ED Notes (Signed)
States nasal congestion and sinus drainage with a sore throat, states bilateral lower back pain shooting down to his back, states he recently started lyrica 3 weeks ago and now he feels "fuzzy", awake and alert in no distress, hx of back pain, denies any blood in urine but states urinary frequency

## 2015-09-28 ENCOUNTER — Inpatient Hospital Stay (HOSPITAL_COMMUNITY)
Admission: RE | Admit: 2015-09-28 | Discharge: 2015-09-28 | Disposition: A | Discharge status: Home / Self Care | Payer: Medicare HMO | Source: Ambulatory Visit

## 2015-09-28 MED ORDER — VANCOMYCIN HCL IN DEXTROSE 1-5 GM/200ML-% IV SOLN: 1000.0000 mg | INTRAVENOUS | Status: DC

## 2015-09-28 NOTE — Pre-Procedure Instructions (Signed)
Kyle [name]  09/28/2015      [facility], [place] [address] Phone: [phone] Fax: [phone]    Your procedure is scheduled on 09/29/15.  Report to [facility] Admitting at 11:15 A.M.  Call this number if you have problems the morning of surgery:  [phone]   Remember:  Do not eat food or drink liquids after midnight.  Take these medicines the morning of surgery with A SIP OF WATER-- ALL INHALERS,ISOSORBIDE,METOPROLOL,OXYCODONE   Do not wear jewelry, make-up or nail polish.  Do not wear lotions, powders, or perfumes.  You may wear deodorant.  Do not shave 48 hours prior to surgery.  Men may shave face and neck.  Do not bring valuables to the hospital.  [facility] is not responsible for any belongings or valuables.  Contacts, dentures or bridgework may not be worn into surgery.  Leave your suitcase in the car.  After surgery it may be brought to your room.  For patients admitted to the hospital, discharge time will be determined by your treatment team.  Patients discharged the day of surgery will not be allowed to drive home.   Name and phone number of your driver:      Please read over the following fact sheets that you were given. MRSA Information   How to Manage Your Diabetes Before and After Surgery  Why is it important to control my blood sugar before and after surgery? . Improving blood sugar levels before and after surgery helps healing and can limit problems. . A way of improving blood sugar control is eating a healthy diet by: o  Eating less sugar and carbohydrates o  Increasing activity/exercise o  Talking with your doctor about reaching your blood sugar goals . High blood sugars (greater than 180 mg/dL) can raise your risk of infections and slow your recovery, so you will need to focus on controlling your diabetes during the weeks before  surgery. . Make sure that the doctor who takes care of your diabetes knows about your planned surgery including the date and location.  How do I manage my blood sugar before surgery? . Check your blood sugar at least 4 times a day, starting 2 days before surgery, to make sure that the level is not too high or low. o Check your blood sugar the morning of your surgery when you wake up and every 2 hours until you get to the Short Stay unit. . If your blood sugar is less than 70 mg/dL, you will need to treat for low blood sugar: o Do not take insulin. o Treat a low blood sugar (less than 70 mg/dL) with  cup of clear juice (cranberry or apple), 4 glucose tablets, OR glucose gel. o Recheck blood sugar in 15 minutes after treatment (to make sure it is greater than 70 mg/dL). If your blood sugar is not greater than 70 mg/dL on recheck, call [phone] for further instructions. . Report your blood sugar to the short stay nurse when you get to Short Stay.  . If you are admitted to the hospital after surgery: o Your blood sugar will be checked by the staff and you will probably be given insulin after surgery (instead of oral diabetes medicines) to make sure you have good blood sugar levels. o The goal for blood sugar control after surgery is 80-180 mg/dL.  WHAT DO I DO ABOUT MY DIABETES MEDICATION?   Marland Kitchen Do not take oral diabetes medicines (pills) the morning of surgery.  . THE NIGHT BEFORE SURGERY, take ______14_____ units of _______humulin____insulin.       . The day of surgery, do not take other diabetes injectables, including Byetta (exenatide), Bydureon (exenatide ER), Victoza (liraglutide), or Trulicity (dulaglutide).  . If your CBG is greater than 220 mg/dL, you may take  of your sliding scale (correction) dose of insulin.  Other Instructions:          Patient Signature:  Date:   Nurse Signature:  Date:   Reviewed and Endorsed by [facility] Patient  Education Committee, August 2015

## 2015-09-29 ENCOUNTER — Encounter (HOSPITAL_COMMUNITY): Payer: Self-pay | Admitting: Anesthesiology

## 2015-09-29 ENCOUNTER — Ambulatory Visit (HOSPITAL_COMMUNITY): Admission: RE | Admit: 2015-09-29 | Payer: Medicare HMO | Source: Ambulatory Visit | Admitting: Neurosurgery

## 2015-09-29 ENCOUNTER — Other Ambulatory Visit: Payer: Medicare HMO

## 2015-09-29 ENCOUNTER — Encounter (HOSPITAL_COMMUNITY): Admission: RE | Payer: Self-pay | Source: Ambulatory Visit

## 2015-09-29 SURGERY — CERVICAL ANTERIOR DISC ARTHROPLASTY
Anesthesia: General

## 2015-09-29 NOTE — Anesthesia Preprocedure Evaluation (Deleted)
Anesthesia Evaluation  Patient identified by MRN, date of birth, ID band Patient awake    Reviewed: Allergy & Precautions, H&P , Patient's Chart, lab work & pertinent test results, reviewed documented beta blocker date and time   Airway Mallampati: II  TM Distance: >3 FB Neck ROM: full    Dental no notable dental hx.    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 at [place];  LV function 45-54%,  Chronic back pain  DDD    Hepatitis C from tattoo   Polysubstance abuse  tobacco abuse and etoh  remote crack/cocaine abuse         Reproductive/Obstetrics                             Anesthesia Physical Anesthesia Plan  ASA: II  Anesthesia Plan: General   Post-op Pain Management:    Induction: Intravenous  Airway Management Planned: Oral ETT  Additional Equipment:   Intra-op Plan:   Post-operative Plan: Extubation in OR  Informed Consent: I have reviewed the patients History and Physical, chart, labs and discussed the procedure including the risks, benefits and alternatives for the proposed anesthesia with the patient or authorized representative who has indicated his/her understanding and acceptance.   Dental Advisory Given and Dental advisory given  Plan Discussed with: CRNA and Surgeon  Anesthesia Plan Comments: (  Discussed general anesthesia, including possible nausea, instrumentation of airway, sore throat,pulmonary aspiration, etc. I asked if the were  any outstanding questions, or  concerns before we proceeded. )        Anesthesia Quick Evaluation

## 2015-10-04 ENCOUNTER — Encounter: Payer: Self-pay | Admitting: Pain Medicine

## 2015-10-04 ENCOUNTER — Ambulatory Visit: Payer: Medicare HMO | Attending: Pain Medicine | Admitting: Pain Medicine

## 2015-10-04 VITALS — BP 162/105 | HR 90 | Temp 98.0°F | Resp 15 | Ht 67.0 in | Wt 169.0 lb

## 2015-10-04 DIAGNOSIS — M47816 Spondylosis without myelopathy or radiculopathy, lumbar region: Secondary | ICD-10-CM

## 2015-10-04 DIAGNOSIS — D1809 Hemangioma of other sites: Secondary | ICD-10-CM | POA: Diagnosis not present

## 2015-10-04 DIAGNOSIS — M5126 Other intervertebral disc displacement, lumbar region: Secondary | ICD-10-CM | POA: Diagnosis not present

## 2015-10-04 DIAGNOSIS — M5481 Occipital neuralgia: Secondary | ICD-10-CM

## 2015-10-04 DIAGNOSIS — M51369 Other intervertebral disc degeneration, lumbar region without mention of lumbar back pain or lower extremity pain: Secondary | ICD-10-CM

## 2015-10-04 DIAGNOSIS — M545 Low back pain: Secondary | ICD-10-CM | POA: Diagnosis present

## 2015-10-04 DIAGNOSIS — M5010 Cervical disc disorder with radiculopathy, unspecified cervical region: Secondary | ICD-10-CM | POA: Diagnosis not present

## 2015-10-04 DIAGNOSIS — M542 Cervicalgia: Secondary | ICD-10-CM | POA: Diagnosis present

## 2015-10-04 DIAGNOSIS — M5416 Radiculopathy, lumbar region: Secondary | ICD-10-CM

## 2015-10-04 DIAGNOSIS — M4802 Spinal stenosis, cervical region: Secondary | ICD-10-CM | POA: Insufficient documentation

## 2015-10-04 DIAGNOSIS — M533 Sacrococcygeal disorders, not elsewhere classified: Secondary | ICD-10-CM | POA: Diagnosis not present

## 2015-10-04 DIAGNOSIS — M5136 Other intervertebral disc degeneration, lumbar region: Secondary | ICD-10-CM | POA: Diagnosis not present

## 2015-10-04 DIAGNOSIS — M5030 Other cervical disc degeneration, unspecified cervical region: Secondary | ICD-10-CM

## 2015-10-04 DIAGNOSIS — M47812 Spondylosis without myelopathy or radiculopathy, cervical region: Secondary | ICD-10-CM

## 2015-10-04 MED ORDER — OXYCODONE HCL 5 MG PO TABS: ORAL_TABLET | ORAL | Status: DC

## 2015-10-04 MED ORDER — PREGABALIN 25 MG PO CAPS: ORAL_CAPSULE | ORAL | Status: DC
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
## 2015-10-04 NOTE — Patient Instructions (Addendum)
PLAN   Continue present medications Lyrica and oxycodone   Lumbar facet, medial branch nerve, blocks to be performed at time return appointment  F/U PCP C Doss for evaliation of  your elevated BP and general medical  condition. Please see C Doss this week ago to the ED for evaluation and treatment  F/U surgical evaluation area follow-up evaluation with Dr. Dayton Bailiff as planned  F/U neurological evaluation. May consider PNCV/EMG studies and other studies pending follow-up evaluations  May consider radiofrequency rhizolysis or intraspinal procedures pending response to present treatment and F/U evaluation   Patient is to call pain management should there be change in condition or should patient have other concerns regarding condition prior to scheduled return appointment  Facet Blocks Patient Information  Description: The facets are joints in the spine between the vertebrae.  Like any joints in the body, facets can become irritated and painful.  Arthritis can also effect the facets.  By injecting steroids and local anesthetic in and around these joints, we can temporarily block the nerve supply to them.  Steroids act directly on irritated nerves and tissues to reduce selling and inflammation which often leads to decreased pain.  Facet blocks may be done anywhere along the spine from the neck to the low back depending upon the location of your pain.   After numbing the skin with local anesthetic (like Novocaine), a small needle is passed onto the facet joints under x-ray guidance.  You may experience a sensation of pressure while this is being done.  The entire block usually lasts about 15-25 minutes.   Conditions which may be treated by facet blocks:   Low back/buttock pain  Neck/shoulder pain  Certain types of headaches  Preparation for the injection:  1. Do not eat any solid food or dairy products within 8 hours of your appointment. 2. You may drink clear liquid up to 3 hours  before appointment.  Clear liquids include water, black coffee, juice or soda.  No milk or cream please. 3. You may take your regular medication, including pain medications, with a sip of water before your appointment.  Diabetics should hold regular insulin (if taken separately) and take 1/2 normal NPH dose the morning of the procedure.  Carry some sugar containing items with you to your appointment. 4. A driver must accompany you and be prepared to drive you home after your procedure. 5. Bring all your current medications with you. 6. An IV may be inserted and sedation may be given at the discretion of the physician. 7. A blood pressure cuff, EKG and other monitors will often be applied during the procedure.  Some patients may need to have extra oxygen administered for a short period. 8. You will be asked to provide medical information, including your allergies and medications, prior to the procedure.  We must know immediately if you are taking blood thinners (like Coumadin/Warfarin) or if you are allergic to IV iodine contrast (dye).  We must know if you could possible be pregnant.  Possible side-effects:   Bleeding from needle site  Infection (rare, may require surgery)  Nerve injury (rare)  Numbness & tingling (temporary)  Difficulty urinating (rare, temporary)  Spinal headache (a headache worse with upright posture)  Light-headedness (temporary)  Pain at injection site (serveral days)  Decreased blood pressure (rare, temporary)  Weakness in arm/leg (temporary)  Pressure sensation in back/neck (temporary)   Call if you experience:   Fever/chills associated with headache or increased back/neck pain  Headache  worsened by an upright position  New onset, weakness or numbness of an extremity below the injection site  Hives or difficulty breathing (go to the emergency room)  Inflammation or drainage at the injection site(s)  Severe back/neck pain greater than usual  New  symptoms which are concerning to you  Please note:  Although the local anesthetic injected can often make your back or neck feel good for several hours after the injection, the pain will likely return. It takes 3-7 days for steroids to work.  You may not notice any pain relief for at least one week.  If effective, we will often do a series of 2-3 injections spaced 3-6 weeks apart to maximally decrease your pain.  After the initial series, you may be a candidate for a more permanent nerve block of the facets.  If you have any questions, please call #[phone]) [facility]
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
## 2015-10-13 NOTE — Telephone Encounter (Signed)
eror

## 2015-10-16 ENCOUNTER — Other Ambulatory Visit: Payer: Medicare HMO

## 2015-10-18 ENCOUNTER — Other Ambulatory Visit: Payer: Medicare HMO

## 2015-10-19 ENCOUNTER — Ambulatory Visit: Payer: Medicare HMO | Admitting: Cardiovascular Disease

## 2015-10-26 ENCOUNTER — Other Ambulatory Visit: Payer: Self-pay

## 2015-10-26 ENCOUNTER — Ambulatory Visit (INDEPENDENT_AMBULATORY_CARE_PROVIDER_SITE_OTHER): Payer: Medicare HMO

## 2015-10-26 DIAGNOSIS — I25810 Atherosclerosis of coronary artery bypass graft(s) without angina pectoris: Secondary | ICD-10-CM

## 2015-10-26 DIAGNOSIS — R0602 Shortness of breath: Secondary | ICD-10-CM | POA: Diagnosis not present

## 2015-10-28 LAB — ECHOCARDIOGRAM COMPLETE
Ao-asc: 39 cm
CHL CUP MV DEC (S): 187
E/e' ratio: 6.91
EWDT: 187 ms
FS: 33 % (ref 28–44)
IVS/LV PW RATIO, ED: 0.99
LA ID, A-P, ES: 32 mm
LA vol: 32.6 mL
LADIAMINDEX: 1.67 cm/m2
LAVOLA4C: 32.5 mL
LAVOLIN: 17 mL/m2
LDCA: 4.52 cm2
LEFT ATRIUM END SYS DIAM: 32 mm
LV E/e'average: 6.91
LV PW d: 14.2 mm — AB (ref 0.6–1.1)
LV TDI E'LATERAL: 10.1
LV e' LATERAL: 10.1 cm/s
LVEEMED: 6.91
LVOT SV: 66 mL
LVOT VTI: 14.7 cm
LVOT diameter: 24 mm
LVOTPV: 88.7 cm/s
MV pk A vel: 76.2 m/s
MVPKEVEL: 69.8 m/s
RV TAPSE: 13.6 mm
TDI e' medial: 6.74

## 2015-11-07 ENCOUNTER — Encounter: Payer: Self-pay | Admitting: Pain Medicine

## 2015-11-07 ENCOUNTER — Ambulatory Visit: Payer: Medicare HMO | Attending: Pain Medicine | Admitting: Pain Medicine

## 2015-11-07 VITALS — BP 143/92 | HR 78 | Temp 97.8°F | Resp 18 | Ht 67.0 in | Wt 170.0 lb

## 2015-11-07 DIAGNOSIS — M5416 Radiculopathy, lumbar region: Secondary | ICD-10-CM

## 2015-11-07 DIAGNOSIS — M542 Cervicalgia: Secondary | ICD-10-CM | POA: Diagnosis present

## 2015-11-07 DIAGNOSIS — M5412 Radiculopathy, cervical region: Secondary | ICD-10-CM | POA: Insufficient documentation

## 2015-11-07 DIAGNOSIS — M47816 Spondylosis without myelopathy or radiculopathy, lumbar region: Secondary | ICD-10-CM

## 2015-11-07 DIAGNOSIS — M5136 Other intervertebral disc degeneration, lumbar region: Secondary | ICD-10-CM | POA: Diagnosis not present

## 2015-11-07 DIAGNOSIS — M4802 Spinal stenosis, cervical region: Secondary | ICD-10-CM | POA: Insufficient documentation

## 2015-11-07 DIAGNOSIS — D1800 Hemangioma unspecified site: Secondary | ICD-10-CM | POA: Insufficient documentation

## 2015-11-07 DIAGNOSIS — M62830 Muscle spasm of back: Secondary | ICD-10-CM | POA: Diagnosis not present

## 2015-11-07 DIAGNOSIS — M5481 Occipital neuralgia: Secondary | ICD-10-CM | POA: Diagnosis not present

## 2015-11-07 DIAGNOSIS — M5126 Other intervertebral disc displacement, lumbar region: Secondary | ICD-10-CM | POA: Insufficient documentation

## 2015-11-07 DIAGNOSIS — M79602 Pain in left arm: Secondary | ICD-10-CM | POA: Diagnosis present

## 2015-11-07 DIAGNOSIS — M5030 Other cervical disc degeneration, unspecified cervical region: Secondary | ICD-10-CM

## 2015-11-07 DIAGNOSIS — M4806 Spinal stenosis, lumbar region: Secondary | ICD-10-CM | POA: Diagnosis not present

## 2015-11-07 DIAGNOSIS — M47812 Spondylosis without myelopathy or radiculopathy, cervical region: Secondary | ICD-10-CM

## 2015-11-07 DIAGNOSIS — M79601 Pain in right arm: Secondary | ICD-10-CM | POA: Diagnosis present

## 2015-11-07 MED ORDER — PREGABALIN 25 MG PO CAPS: ORAL_CAPSULE | ORAL | Status: DC

## 2015-11-07 MED ORDER — OXYCODONE HCL 5 MG PO TABS: ORAL_TABLET | ORAL | Status: DC

## 2015-11-07 NOTE — Patient Instructions (Addendum)
PLAN   Continue present medications Lyrica and oxycodone   Lumbar facet, medial branch nerve, blocks to be performed at time return appointment  F/U  PCP C Doss for evaliation of  your elevated BP and general medical  condition.   F/U surgical evaluation area follow-up evaluation with Dr. Dayton Bailiff as planned  F/U neurological evaluation. May consider PNCV/EMG studies and other studies pending follow-up evaluations  May consider radiofrequency rhizolysis or intraspinal procedures pending response to present treatment and F/U evaluation   Patient is to call pain management should there be change in condition or should patient have other concerns regarding condition prior to scheduled return appointmentPain Management Discharge Instructions  General Discharge Instructions :  If you need to reach your doctor call: Monday-Friday 8:00 am - 4:00 pm at [phone] or toll free [phone].  After clinic hours [phone] to have operator reach doctor.  Bring all of your medication bottles to all your appointments in the pain clinic.  To cancel or reschedule your appointment with Pain Management please remember to call 24 hours in advance to avoid a fee.  Refer to the educational materials which you have been given on: General Risks, I had my Procedure. Discharge Instructions, Post Sedation.  Post Procedure Instructions:  The drugs you were given will stay in your system until tomorrow, so for the next 24 hours you should not drive, make any legal decisions or drink any alcoholic beverages.  You may eat anything you prefer, but it is better to start with liquids then soups and crackers, and gradually work up to solid foods.  Please notify your doctor immediately if you have any unusual bleeding, trouble breathing or pain that is not related to your normal pain.  Depending on the type of procedure that was done, some parts of your body may feel week and/or numb.  This usually clears  up by tonight or the next day.  Walk with the use of an assistive device or accompanied by an adult for the 24 hours.  You may use ice on the affected area for the first 24 hours.  Put ice in a Ziploc bag and cover with a towel and place against area 15 minutes on 15 minutes off.  You may switch to heat after 24 hours.

## 2015-11-07 NOTE — Progress Notes (Signed)
Safety precautions to be maintained throughout the outpatient stay will include: orient to surroundings, keep bed in low position, maintain call bell within reach at all times, provide assistance with transfer out of bed and ambulation.  

## 2015-11-07 NOTE — Progress Notes (Signed)
Subjective:    Patient ID: Kyle [name], male    DOB: 1960-09-08, 55 y.o.   MRN: [id number]  HPI  The patient is a 55 year old gentleman who returns to pain management for further evaluation and treatment of pain involving the region of the neck upper extremity regions as well as the lower back and lower extremity regions. The patient is without trauma change in events of daily living because no change in symptomatology. We discussed patient's condition and patient stated that the lower back pain was more bothersome than the pain of the cervical region. The patient will undergo further neurosurgical evaluation and we will proceed with lumbar facet, medial branch nerve, blocks to be performed at time of return appointment. The patient will continue Lyrica and oxycodone at this time and will call pain management should they be change in condition prior to scheduled return appointment. All agreed to suggested treatment plan.     Review of Systems     Objective:   Physical Exam  There was tenderness of the splenius capitis and occipitalis region palpation which reproduces moderate discomfort with limited range of motion of the cervical spine with unremarkable Spurling's maneuver. Palpation of the acromioclavicular and glenohumeral joint regions reproduce mild discomfort and patient was able to perform drop test without significant difficulty. There was tenderness of the cervical facet and thoracic facet region palpation which reproduces moderate discomfort. The patient appeared to be with slightly decreased grip strength with Tinel and Phalen's maneuver reproducing minimal discomfort. Palpation of the thoracic region was without crepitus of the thoracic region with evidence of muscle spasm of the upper mid and lower thoracic region a moderate degree. Palpation over the lumbar region was with moderate to severe degree of pain with lateral bending rotation reproducing pain of significant degree.  Straight leg raising was tolerated to approximately 20 without increased pain with dorsiflexion noted. DTRs appeared to be trace at the knees and there was negative clonus negative Homans. Palpation of the PSIS and PII S regions reproduce moderate discomfort. Abdomen was nontender with no costovertebral angle tenderness noted      Assessment & Plan:      Degenerative disc disease lumbar spine  Normal alignment of the lumbar vertebral bodies. They demonstrate normal marrow signal except for a few small scattered hemangiomas. The last full intervertebral disc space is labeled L5-S1 and the conus medullaris terminates at T12-L1. The facets are normally aligned. No definite pars defects. No significant paraspinal or retroperitoneal findings.  Mild bulging discs and mild facet disease but no focal disc protrusions, significant spinal or foraminal stenosis.egenerative disc disease lumbar spine  Lumbar facet syndrome  Sacroiliac joint dysfunction  Degenerative disc disease of the cervical spine  1. 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 Is also mild spinal stenosis at C4-5. 2. Bulging discs and mild facet disease in the lumbar spine but no significant disc protrusions, spinal or foraminal stenosis  Cervical facet syndrome  Cervical radiculopathy  Bilateral occipital neuralgia     PLAN   Continue present medications Lyrica and oxycodone   Lumbar facet, medial branch nerve, blocks to be performed at time return appointment  F/U  PCP C Doss for evaliation of  your elevated BP and general medical  condition.   F/U surgical evaluation area follow-up evaluation with Dr. Dayton Bailiff as planned  F/U neurological evaluation. May consider PNCV/EMG studies and other studies pending follow-up evaluations  May consider radiofrequency rhizolysis or intraspinal procedures pending response to  present treatment and F/U evaluation   Patient is to call pain  management should there be change in condition or should patient have other concerns regarding condition prior to scheduled return appointment

## 2015-11-21 ENCOUNTER — Emergency Department
Admission: EM | Admit: 2015-11-21 | Discharge: 2015-11-21 | Disposition: A | Discharge status: Home / Self Care | Payer: Medicare HMO | Attending: Emergency Medicine | Admitting: Emergency Medicine

## 2015-11-21 ENCOUNTER — Encounter: Payer: Self-pay | Admitting: Emergency Medicine

## 2015-11-21 DIAGNOSIS — J45909 Unspecified asthma, uncomplicated: Secondary | ICD-10-CM | POA: Diagnosis not present

## 2015-11-21 DIAGNOSIS — J449 Chronic obstructive pulmonary disease, unspecified: Secondary | ICD-10-CM | POA: Diagnosis not present

## 2015-11-21 DIAGNOSIS — Z794 Long term (current) use of insulin: Secondary | ICD-10-CM | POA: Insufficient documentation

## 2015-11-21 DIAGNOSIS — E1165 Type 2 diabetes mellitus with hyperglycemia: Secondary | ICD-10-CM | POA: Insufficient documentation

## 2015-11-21 DIAGNOSIS — Z792 Long term (current) use of antibiotics: Secondary | ICD-10-CM | POA: Diagnosis not present

## 2015-11-21 DIAGNOSIS — Z79899 Other long term (current) drug therapy: Secondary | ICD-10-CM | POA: Insufficient documentation

## 2015-11-21 DIAGNOSIS — Z959 Presence of cardiac and vascular implant and graft, unspecified: Secondary | ICD-10-CM | POA: Diagnosis not present

## 2015-11-21 DIAGNOSIS — I2510 Atherosclerotic heart disease of native coronary artery without angina pectoris: Secondary | ICD-10-CM | POA: Insufficient documentation

## 2015-11-21 DIAGNOSIS — I10 Essential (primary) hypertension: Secondary | ICD-10-CM | POA: Diagnosis not present

## 2015-11-21 DIAGNOSIS — M545 Low back pain: Secondary | ICD-10-CM | POA: Diagnosis present

## 2015-11-21 DIAGNOSIS — Z7982 Long term (current) use of aspirin: Secondary | ICD-10-CM | POA: Insufficient documentation

## 2015-11-21 DIAGNOSIS — Z87891 Personal history of nicotine dependence: Secondary | ICD-10-CM | POA: Insufficient documentation

## 2015-11-21 DIAGNOSIS — G8929 Other chronic pain: Secondary | ICD-10-CM | POA: Diagnosis not present

## 2015-11-21 DIAGNOSIS — E785 Hyperlipidemia, unspecified: Secondary | ICD-10-CM | POA: Diagnosis not present

## 2015-11-21 DIAGNOSIS — M549 Dorsalgia, unspecified: Secondary | ICD-10-CM

## 2015-11-21 DIAGNOSIS — Z7984 Long term (current) use of oral hypoglycemic drugs: Secondary | ICD-10-CM | POA: Insufficient documentation

## 2015-11-21 LAB — URINALYSIS COMPLETE WITH MICROSCOPIC (ARMC ONLY)
BACTERIA UA: NONE SEEN
Bilirubin Urine: NEGATIVE
HGB URINE DIPSTICK: NEGATIVE
KETONES UR: NEGATIVE mg/dL
LEUKOCYTES UA: NEGATIVE
NITRITE: NEGATIVE
Protein, ur: NEGATIVE mg/dL
SPECIFIC GRAVITY, URINE: 1.026 (ref 1.005–1.030)
Squamous Epithelial / LPF: NONE SEEN
pH: 5 (ref 5.0–8.0)

## 2015-11-21 LAB — GLUCOSE, CAPILLARY: Glucose-Capillary: 284 mg/dL — ABNORMAL HIGH (ref 65–99)

## 2015-11-21 MED ORDER — NAPROXEN 375 MG PO TABS: 375.0000 mg | ORAL_TABLET | Freq: Two times a day (BID) | ORAL | 2 refills | Status: DC

## 2015-11-21 NOTE — ED Provider Notes (Signed)
[facility] Emergency Department Provider Note   ____________________________________________  Time seen: Approximately 9:35 AM  I have reviewed the triage vital signs and the nursing notes.   HISTORY  Chief Complaint Back Pain    HPI Kyle [name] is a 55 y.o. male is here with complaint of low back pain. Patient states he has chronic low back pain that he has had for "forever" which now radiates down around his hip area. Patient states that he has been seeing Dr. Primus Bravo in the pain clinic. Patient states he has not seen Dr. Primus Bravo and 2 months. Patient states that this is the same pain that he is experienced in the past. He denies any urinary symptoms or history of kidney stones. He denies any recent injury to his back. He states that currently he is not taking any medication for his back. He states that movement increases his pain and that lying in bed doesn't help get rid of his pain. He denies any paresthesias or any incontinence of bowel or bladder. Currently he rates his pain a 7 out of 10.     Past Medical History:  Diagnosis Date  . Allergy   . Asthma   . Chronic back pain 1981  . COPD (chronic obstructive pulmonary disease) ([place])   . Coronary artery disease involving native coronary artery    a. 4 vessel CABG 10/2012 at [place]; b. [facility] 08/2014: small defect of mild severity present along apical lateral & apex location, no st segment changes,  LV function 45-54%, poor gated images lead to poor EF & wall motion assessment, low risk study  . DDD (degenerative disc disease)   . DM2 (diabetes mellitus, type 2) ([place])   . Fainting spell   . GERD (gastroesophageal reflux disease)   . Hepatitis C    2/2 tattoo  . Hyperlipidemia with target LDL less than 70   . Hypertension   . Polysubstance abuse    a. ongoing daily tobacco abuse and etoh (on the weekends), remote crack/cocaine abuse last used approximately in 2006  . S/P CABG  x 4    a. LIMA-LAD, SVG-[place], SVG-OM,SVG-dRCA  . Urine incontinence     Patient Active Problem List   Diagnosis Date Noted  . DDD (degenerative disc disease), lumbar 01/10/2015  . Facet syndrome, lumbar 01/10/2015  . Lumbar radiculopathy 01/10/2015  . DDD (degenerative disc disease), cervical 11/22/2014  . Cervical facet syndrome 11/22/2014  . Bilateral occipital neuralgia 11/22/2014  . Hospital discharge follow-up 10/16/2014  . Coronary artery disease involving native coronary artery   . S/P CABG x 4   . Chest pain with moderate risk for cardiac etiology 09/27/2014  . Cervicalgia 07/05/2014  . Acute bacterial rhinosinusitis 06/15/2014  . Non compliance w medication regimen 06/07/2014  . Chest tightness 06/07/2014  . Tobacco abuse counseling 01/05/2014  . Vitamin D deficiency 01/05/2014  . Transaminitis 01/05/2014  . Temporary low platelet count ([place]) 01/05/2014  . Diabetes mellitus type 2, uncontrolled ([place]) 01/05/2014  . Back pain 10/01/2013  . Need for prophylactic vaccination with tetanus-diphtheria (TD) 10/01/2013  . Penile lesion 10/01/2013  . Essential hypertension 08/29/2013  . Hyperlipidemia with target LDL less than 70 08/29/2013  . Callus of foot 08/29/2013  . Blurred vision 08/29/2013  . Itching 08/29/2013  . Exacerbation of chronic back pain 06/07/2013  . Hyperlipidemia 03/02/2013  . Former smoker 03/02/2013  . Routine general medical examination at a health care facility 02/24/2013  . Hepatitis  C 02/24/2013  . Diabetes type 2, uncontrolled ([place]) 02/24/2013    Past Surgical History:  Procedure Laterality Date  . CARDIAC CATHETERIZATION  11/2012   [facility] - Multivessel CAD  . CORONARY ARTERY BYPASS GRAFT  8/14   CABG x 4; LIMA-LAD, SVG-RI, SVG-OM1, SVG-rPDA (Atretic RIMA).  . neck injection      Current Outpatient Rx  . Order #: [id number] Class: Normal  . Order #: [id number] Class: Normal  . Order #: [id number] Class: Print  . Order #:  [id number] Class: Print  . Order #: [id number] Class: Historical Med  . Order #: [id number] Class: Normal  . Order #: [id number] Class: Normal  . Order #: [id number] Class: Normal  . Order #: [id number] Class: Print  . Order #: [id number] Class: Print  . Order #: [id number] Class: Print  . Order #: [id number] Class: Print  . Order #: [id number] Class: Historical Med  . Order #: [id number] Class: Historical Med    Allergies Amoxicillin; Flexeril [cyclobenzaprine]; Methadone; Tramadol; and Trazodone and nefazodone  Family History  Problem Relation Age of Onset  . Heart disease Mother   . Heart disease Father   . Hypertension Father   . Diabetes Sister   . Cancer Sister     skin cancer  . Diabetes Other   . Heart disease Other     Social History Social History  Substance Use Topics  . Smoking status: Former Smoker    Years: 35.00    Types: Cigarettes    Quit date: 11/24/2012  . Smokeless tobacco: Never Used  . Alcohol use 0.0 oz/week     Comment: occasional    Review of Systems Constitutional: No fever/chills Eyes: No visual changes. ENT: No sore throat. Cardiovascular: Denies chest pain. Respiratory: Denies shortness of breath. Gastrointestinal: No abdominal pain.  No nausea, no vomiting.  No diarrhea.  No constipation. Genitourinary: Negative for dysuria. Musculoskeletal: Positive for chronic back pain. Skin: Negative for rash. Neurological: Negative for headaches, focal weakness or numbness.  10-point ROS otherwise negative.  ____________________________________________   PHYSICAL EXAM:  VITAL SIGNS: ED Triage Vitals  Enc Vitals Group     BP 11/21/15 0859 132/86     Pulse Rate 11/21/15 0859 88     Resp 11/21/15 0859 17     Temp 11/21/15 0859 97.6 F (36.4 C)     Temp Source 11/21/15 0859 Oral     SpO2 11/21/15 0859 98 %     Weight 11/21/15 0859 170 lb (77.1 kg)     Height 11/21/15 0859 5\' 7"  (1.702 m)     Head Circumference --      Peak Flow --      Pain Score  11/21/15 0900 7     Pain Loc --      Pain Edu? --      Excl. in [place]? --     Constitutional: Alert and oriented. Well appearing and in no acute distress. Eyes: Conjunctivae are normal. PERRL. EOMI. Head: Atraumatic. Nose: No congestion/rhinnorhea. Neck: No stridor.   Cardiovascular: Normal rate, regular rhythm. Grossly normal heart sounds.  Good peripheral circulation. Respiratory: Normal respiratory effort.  No retractions. Lungs CTAB. Gastrointestinal: Soft and nontender. No distention.  Musculoskeletal: On examination of the back there is no gross deformity and no active muscle spasm seen with range of motion. There is some minimal tenderness on palpation of the lumbar spine and paravertebral muscles. Patient is able move upper and lower extremities without any difficulty. Straight leg raises were 90 without discomfort. Patient was ambulatory without assistance. Neurologic:  Normal  speech and language. No gross focal neurologic deficits are appreciated. No gait instability. Reflexes were 2+ bilaterally. Skin:  Skin is warm, dry and intact. No rash noted. Psychiatric: Mood and affect are normal. Speech and behavior are normal.  ____________________________________________   LABS (all labs ordered are listed, but only abnormal results are displayed)  Labs Reviewed  URINALYSIS COMPLETEWITH MICROSCOPIC ([place]) - Abnormal; Notable for the following:       Result Value   Color, Urine AMBER (*)    APPearance CLEAR (*)    Glucose, UA >500 (*)    All other components within normal limits  GLUCOSE, CAPILLARY - Abnormal; Notable for the following:    Glucose-Capillary 284 (*)    All other components within normal limits  CBG MONITORING, ED     PROCEDURES  Procedure(s) performed: None  Procedures  Critical Care performed: No  ____________________________________________   INITIAL IMPRESSION / ASSESSMENT AND PLAN / ED COURSE  Pertinent labs & imaging results that were  available during my care of the patient were reviewed by me and considered in my medical decision making (see chart for details).    Clinical Course   Urinalysis in the emergency room showed a glucose over 500. Patient stated that he has not taken his diabetes medication in the last 24 hours. He is non-fasting blood sugar at this time is 284. Patient was encouraged to be compliant with his diabetes medication and follow-up with his doctor concerning his diabetes. We also discussed that he has in fact seen Dr. Primus Bravo  in the month of July where he was given oxycodone 5 mg 6D tablets along with Lyrica 25 mg #60 tablets. Patient then admits that he did see his doctor at the pain clinic. No other medication was prescribed.  ____________________________________________   FINAL CLINICAL IMPRESSION(S) / ED DIAGNOSES  Final diagnoses:  Chronic back pain  Uncontrolled type 2 diabetes mellitus with hyperglycemia, unspecified long term insulin use status (HCC)      NEW MEDICATIONS STARTED DURING THIS VISIT:  Discharge Medication List as of 11/21/2015 11:15 AM    START taking these medications   Details  naproxen (NAPROSYN) 375 MG tablet Take 1 tablet (375 mg total) by mouth 2 (two) times daily with a meal., Starting Tue 11/21/2015, Until Wed 11/20/2016, Print         Note:  This document was prepared using Dragon voice recognition software and may include unintentional dictation errors.    Johnn Hai, PA-C 11/21/15 [place], MD 11/21/15 2210    Rudene Re, MD 11/21/15 2211

## 2015-11-21 NOTE — Discharge Instructions (Signed)
You need to follow-up with your doctor and call for an appointment with DR. Crisp. Continue your regular medications of oxycodone and Lyrica as prescribed by Dr. crisp. You may take naproxen twice a day only with food. Do not forget to take your diabetes medication to keep her diabetes under control.

## 2015-11-21 NOTE — ED Notes (Signed)
See triage states he developed lower back pain which radiates into both hips about 2 weeks ago  Denies any injury ambulates well to treatment area  States he has been going to pain clinic until about 2 months ago

## 2015-11-21 NOTE — ED Triage Notes (Addendum)
Pt reports chronic lower back pain "forever", reports pain now radiates around bilateral flanks. Pt reports he used to have epidurals for back pain with Dr Natale Milch, but was unable to get ahold of him yesterday. Pt denies any urinary symptoms.

## 2015-11-22 ENCOUNTER — Ambulatory Visit: Payer: Medicare HMO | Admitting: Cardiovascular Disease

## 2015-12-06 ENCOUNTER — Telehealth: Payer: Self-pay | Admitting: *Deleted

## 2015-12-07 ENCOUNTER — Telehealth: Payer: Self-pay | Admitting: *Deleted

## 2015-12-07 ENCOUNTER — Telehealth: Payer: Self-pay | Admitting: Pain Medicine

## 2015-12-07 ENCOUNTER — Ambulatory Visit: Payer: Medicare HMO | Admitting: Pain Medicine

## 2015-12-07 NOTE — Telephone Encounter (Signed)
Patient returned call is upset regarding no show fee. Informed him that was the physician fee and not related to the hospital. I offered to give him billings number. He declined it at this time. Informed Dr. Primus Bravo regarding matter.

## 2015-12-07 NOTE — Telephone Encounter (Signed)
Kyle [name] says he is really sick with vomiting and diarreaha. And has been all night, not able to come in. Could you please waive the fee this one time.

## 2015-12-07 NOTE — Telephone Encounter (Signed)
Thank you :)

## 2015-12-07 NOTE — Telephone Encounter (Signed)
Thank you for the information.

## 2015-12-18 ENCOUNTER — Ambulatory Visit: Payer: Medicare HMO | Attending: Pain Medicine | Admitting: Pain Medicine

## 2015-12-18 ENCOUNTER — Encounter: Payer: Self-pay | Admitting: Pain Medicine

## 2015-12-18 VITALS — BP 116/81 | HR 89 | Temp 97.6°F | Resp 15 | Ht 67.0 in | Wt 170.0 lb

## 2015-12-18 DIAGNOSIS — M5021 Other cervical disc displacement,  high cervical region: Secondary | ICD-10-CM | POA: Insufficient documentation

## 2015-12-18 DIAGNOSIS — M79601 Pain in right arm: Secondary | ICD-10-CM | POA: Diagnosis present

## 2015-12-18 DIAGNOSIS — M5030 Other cervical disc degeneration, unspecified cervical region: Secondary | ICD-10-CM | POA: Insufficient documentation

## 2015-12-18 DIAGNOSIS — M5481 Occipital neuralgia: Secondary | ICD-10-CM | POA: Diagnosis not present

## 2015-12-18 DIAGNOSIS — M5126 Other intervertebral disc displacement, lumbar region: Secondary | ICD-10-CM | POA: Diagnosis not present

## 2015-12-18 DIAGNOSIS — M4802 Spinal stenosis, cervical region: Secondary | ICD-10-CM | POA: Insufficient documentation

## 2015-12-18 DIAGNOSIS — M533 Sacrococcygeal disorders, not elsewhere classified: Secondary | ICD-10-CM | POA: Diagnosis not present

## 2015-12-18 DIAGNOSIS — M5412 Radiculopathy, cervical region: Secondary | ICD-10-CM | POA: Insufficient documentation

## 2015-12-18 DIAGNOSIS — D1800 Hemangioma unspecified site: Secondary | ICD-10-CM | POA: Insufficient documentation

## 2015-12-18 DIAGNOSIS — M79602 Pain in left arm: Secondary | ICD-10-CM | POA: Diagnosis present

## 2015-12-18 DIAGNOSIS — M5136 Other intervertebral disc degeneration, lumbar region: Secondary | ICD-10-CM | POA: Diagnosis not present

## 2015-12-18 DIAGNOSIS — M5416 Radiculopathy, lumbar region: Secondary | ICD-10-CM

## 2015-12-18 DIAGNOSIS — M47812 Spondylosis without myelopathy or radiculopathy, cervical region: Secondary | ICD-10-CM

## 2015-12-18 DIAGNOSIS — M542 Cervicalgia: Secondary | ICD-10-CM | POA: Diagnosis present

## 2015-12-18 MED ORDER — OXYCODONE HCL 5 MG PO TABS: ORAL_TABLET | ORAL | 0 refills | Status: DC

## 2015-12-18 MED ORDER — PREGABALIN 25 MG PO CAPS: ORAL_CAPSULE | ORAL | 0 refills | Status: DC

## 2015-12-18 NOTE — Progress Notes (Signed)
Safety precautions to be maintained throughout the outpatient stay will include: orient to surroundings, keep bed in low position, maintain call bell within reach at all times, provide assistance with transfer out of bed and ambulation. ." Had vomiting and diarrhea since last visit. Called [place] and rescheduled thsi appt,."

## 2015-12-18 NOTE — Progress Notes (Signed)
The patient is a 55 year old gentleman who returns to pain management for further evaluation and treatment of pain involving the region of the neck upper extremity region as well as the lower back and lower extremity region. The patient is undergone neurosurgical evaluation by Dr. Cyndy Freeze and is to follow-up with Dr. Cyndy Freeze to consider surgical intervention. The patient states that the pain is aggravated by reaching and lifting pushing pulling maneuvers as well as turning the head. The patient also states that the pain is increased with standing walking twisting turning maneuvers and has increased with activities on the feet. The pain also interferes the patient ability to obtain restful sleep. We discussed patient's condition and at the present time we will continue Lyrica and oxycodone and patient will undergo follow-up evaluation with Dr. Cyndy Freeze as discussed. We will consider patient for interventional treatment pending follow-up evaluation as discussed. All agreed with suggested treatment plan.     Physical examination  There was tenderness to palpation of the paraspinal musculature region of the cervical region cervical facet region palpation which reproduces moderate to moderately severe discomfort. The patient moves slowly and cautiously during the examination and appeared to be with severe pain with any movement. Patient rests severe pain with range of motion maneuvers of the cervical region as well as the lumbar region. The patient was tenderness of moderate to moderately severe degree of the splenius capitis and occipitalis region as well as the cervical facet region. Palpation over the thoracic facet region reproduced severe disabling pain is well. The patient appeared to be with bilaterally equal grip strength without significant increase of pain with Tinel Tinel and Phalen's maneuver and appeared to be unremarkable Spurling's maneuver. Palpation of the acromioclavicular and glenohumeral  joint regions reproduced pain of mild-to-moderate degree and patient was able to perform drop test with moderate difficulty. Palpation over the region of the lumbar region was of increased pain with lateral bending rotation extension and palpation over the lumbar facet region reproduced pain of degree. Palpation over the PSIS and PII S region reproduced moderate discomfort with palpation over the greater trochanteric region iliotibial band region reproducing mild discomfort. Straight leg raising was tolerates approximately 20 without a definite increase of pain with dorsiflexion noted. There appeared to be negative clonus negative Homans. No definite sensory deficit of dermatomal distribution detected. There was negative clonus negative Homans. Abdomen was nontender with no costovertebral angle tenderness noted.      Assessment  Degenerative disc disease lumbar spine  Normal alignment of the lumbar vertebral bodies. They demonstrate normal marrow signal except for a few small scattered hemangiomas. The last full intervertebral disc space is labeled L5-S1 and the conus medullaris terminates at T12-L1. The facets are normally aligned. No definite pars defects. No significant paraspinal or retroperitoneal findings.  Mild bulging discs and mild facet disease but no focal disc protrusions, significant spinal or foraminal stenosis.egenerative disc disease lumbar spine  Lumbar facet syndrome  Sacroiliac joint dysfunction  Degenerative disc disease of the cervical spine  1. 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 Is also mild spinal stenosis at C4-5. 2. Bulging discs and mild facet disease in the lumbar spine but no significant disc protrusions, spinal or foraminal stenosis  Cervical facet syndrome  Cervical radiculopathy  Bilateral occipital neuralgia      PLAN   Continue present medications Lyrica and oxycodone  F/U  PCP C Doss for evaliation of  your  elevated BP and general medical  condition.  F/U surgical evaluation area follow-up evaluation with Dr. Dayton Bailiff as planned  F/U neurological evaluation. May consider PNCV/EMG studies and other studies pending follow-up evaluations  May consider radiofrequency rhizolysis or intraspinal procedures pending response to present treatment and F/U evaluation   Patient is to call pain management should there be change in condition or should patient have other concerns regarding condition prior to scheduled return appointment

## 2015-12-18 NOTE — Patient Instructions (Addendum)
PLAN   Continue present medications Lyrica and oxycodone  F/U  PCP C Doss for evaliation of  your elevated BP and general medical  condition.   F/U surgical evaluation area follow-up evaluation with Dr. Dayton Bailiff as planned  F/U neurological evaluation. May consider PNCV/EMG studies and other studies pending follow-up evaluations  May consider radiofrequency rhizolysis or intraspinal procedures pending response to present treatment and F/U evaluation   Patient is to call pain management should there be change in condition or should patient have other concerns regarding condition prior to scheduled return appointment

## 2015-12-21 ENCOUNTER — Ambulatory Visit: Payer: Medicare HMO | Admitting: Nurse Practitioner

## 2015-12-21 ENCOUNTER — Encounter: Payer: Self-pay | Admitting: Nurse Practitioner

## 2015-12-28 ENCOUNTER — Ambulatory Visit: Payer: Medicare HMO | Admitting: Nurse Practitioner

## 2015-12-28 ENCOUNTER — Encounter: Payer: Self-pay | Admitting: Nurse Practitioner

## 2015-12-28 ENCOUNTER — Encounter: Payer: Self-pay | Admitting: *Deleted

## 2016-01-02 ENCOUNTER — Telehealth: Payer: Self-pay | Admitting: *Deleted

## 2016-01-18 ENCOUNTER — Ambulatory Visit: Payer: Medicare HMO | Admitting: Pain Medicine

## 2016-01-18 ENCOUNTER — Emergency Department: Payer: Medicare HMO

## 2016-01-18 ENCOUNTER — Encounter: Payer: Self-pay | Admitting: Emergency Medicine

## 2016-01-18 ENCOUNTER — Emergency Department
Admission: EM | Admit: 2016-01-18 | Discharge: 2016-01-18 | Disposition: A | Discharge status: Home / Self Care | Payer: Medicare HMO | Attending: Emergency Medicine | Admitting: Emergency Medicine

## 2016-01-18 DIAGNOSIS — Z79899 Other long term (current) drug therapy: Secondary | ICD-10-CM | POA: Insufficient documentation

## 2016-01-18 DIAGNOSIS — F17210 Nicotine dependence, cigarettes, uncomplicated: Secondary | ICD-10-CM | POA: Insufficient documentation

## 2016-01-18 DIAGNOSIS — I10 Essential (primary) hypertension: Secondary | ICD-10-CM | POA: Insufficient documentation

## 2016-01-18 DIAGNOSIS — J45909 Unspecified asthma, uncomplicated: Secondary | ICD-10-CM | POA: Insufficient documentation

## 2016-01-18 DIAGNOSIS — I2510 Atherosclerotic heart disease of native coronary artery without angina pectoris: Secondary | ICD-10-CM | POA: Insufficient documentation

## 2016-01-18 DIAGNOSIS — E119 Type 2 diabetes mellitus without complications: Secondary | ICD-10-CM | POA: Insufficient documentation

## 2016-01-18 DIAGNOSIS — R05 Cough: Secondary | ICD-10-CM | POA: Diagnosis present

## 2016-01-18 DIAGNOSIS — M549 Dorsalgia, unspecified: Secondary | ICD-10-CM | POA: Diagnosis not present

## 2016-01-18 DIAGNOSIS — G8929 Other chronic pain: Secondary | ICD-10-CM | POA: Insufficient documentation

## 2016-01-18 DIAGNOSIS — Z7982 Long term (current) use of aspirin: Secondary | ICD-10-CM | POA: Diagnosis not present

## 2016-01-18 DIAGNOSIS — J069 Acute upper respiratory infection, unspecified: Secondary | ICD-10-CM | POA: Diagnosis not present

## 2016-01-18 DIAGNOSIS — J449 Chronic obstructive pulmonary disease, unspecified: Secondary | ICD-10-CM | POA: Diagnosis not present

## 2016-01-18 DIAGNOSIS — Z794 Long term (current) use of insulin: Secondary | ICD-10-CM | POA: Insufficient documentation

## 2016-01-18 MED ORDER — AZITHROMYCIN 250 MG PO TABS: ORAL_TABLET | ORAL | 0 refills | Status: DC

## 2016-01-18 MED ORDER — CHLORPHENIRAMINE MALEATE 4 MG PO TABS: 4.0000 mg | ORAL_TABLET | Freq: Two times a day (BID) | ORAL | 0 refills | Status: DC | PRN

## 2016-01-18 MED ORDER — KETOROLAC TROMETHAMINE 60 MG/2ML IM SOLN
60.0000 mg | Freq: Once | INTRAMUSCULAR | Status: AC
  Administered 2016-01-18: 60 mg via INTRAMUSCULAR
  Filled 2016-01-18: qty 2

## 2016-01-18 MED ORDER — GUAIFENESIN-CODEINE 100-10 MG/5ML PO SOLN
10.0000 mL | ORAL | 0 refills | Status: DC | PRN
Start: 2016-01-18 — End: 2016-10-25

## 2016-01-18 MED ORDER — OXYCODONE-ACETAMINOPHEN 5-325 MG PO TABS
1.0000 | ORAL_TABLET | Freq: Once | ORAL | Status: AC
  Administered 2016-01-18: 1 via ORAL
  Filled 2016-01-18: qty 1

## 2016-01-18 NOTE — ED Triage Notes (Signed)
Pt to ED today with congestion, sneezing, headache that's keeping him up at night.  Pt also states he is having back and neck pain that is chronic.  States his pain doctor got moved and he hasn't been able to see him in 2 months and needs med refills.

## 2016-01-18 NOTE — ED Provider Notes (Signed)
[facility] Emergency Department Provider Note   ____________________________________________   None    (approximate)  I have reviewed the triage vital signs and the nursing notes.   HISTORY  Chief Complaint Back Pain; Nasal Congestion; and Headache    HPI Kyle [name] is a 55 y.o. male presents for evaluation of cough congestion and drainage times several days. In addition patient has a chronic  pain management patient and is trying to track time is new pain doctor. He is not requesting any pain medicine at this time.   Past Medical History:  Diagnosis Date  . Allergy   . Asthma   . Chronic back pain 1981   a.followed by pain clinic and neurosurgery.  Marland Kitchen COPD (chronic obstructive pulmonary disease) ([place])   . Coronary artery disease involving native coronary artery    a. 10/2012 CABG x 4 @ [facility] (LIMA->LAD, VG->RI, VG->OM, VG->PDA);  b. 08/2014 Lexiscan MV: small defect of mild severity present along apical lateral & apex location, no st segment changes, LV function 45-54%, poor gated images lead to poor EF & wall motion assessment, low risk study.  . DDD (degenerative disc disease)   . DM2 (diabetes mellitus, type 2) ([place])   . Fainting spell   . GERD (gastroesophageal reflux disease)   . Hepatitis C    2/2 tattoo  . Hyperlipidemia with target LDL less than 70   . Hypertension   . Polysubstance abuse    a. ongoing daily tobacco abuse and etoh (on the weekends), remote crack/cocaine abuse last used approximately in 2006  . S/P CABG x 4    a. LIMA-LAD, SVG-Ramus, SVG-OM,SVG-dRCA  . Urine incontinence     Patient Active Problem List   Diagnosis Date Noted  . DDD (degenerative disc disease), lumbar 01/10/2015  . Facet syndrome, lumbar 01/10/2015  . Lumbar radiculopathy 01/10/2015  . DDD (degenerative disc disease), cervical 11/22/2014  . Cervical facet syndrome 11/22/2014  . Bilateral occipital neuralgia 11/22/2014  . Hospital  discharge follow-up 10/16/2014  . Coronary artery disease involving native coronary artery   . S/P CABG x 4   . Chest pain with moderate risk for cardiac etiology 09/27/2014  . Cervicalgia 07/05/2014  . Acute bacterial rhinosinusitis 06/15/2014  . Non compliance w medication regimen 06/07/2014  . Chest tightness 06/07/2014  . Tobacco abuse counseling 01/05/2014  . Vitamin D deficiency 01/05/2014  . Transaminitis 01/05/2014  . Temporary low platelet count ([place]) 01/05/2014  . Diabetes mellitus type 2, uncontrolled ([place]) 01/05/2014  . Back pain 10/01/2013  . Need for prophylactic vaccination with tetanus-diphtheria (TD) 10/01/2013  . Penile lesion 10/01/2013  . Essential hypertension 08/29/2013  . Hyperlipidemia with target LDL less than 70 08/29/2013  . Callus of foot 08/29/2013  . Blurred vision 08/29/2013  . Itching 08/29/2013  . Exacerbation of chronic back pain 06/07/2013  . Hyperlipidemia 03/02/2013  . Former smoker 03/02/2013  . Routine general medical examination at a health care facility 02/24/2013  . Hepatitis C 02/24/2013  . Diabetes type 2, uncontrolled ([place]) 02/24/2013    Past Surgical History:  Procedure Laterality Date  . CARDIAC CATHETERIZATION  11/2012   [facility] - Multivessel CAD  . CORONARY ARTERY BYPASS GRAFT  8/14   CABG x 4; LIMA-LAD, SVG-RI, SVG-OM1, SVG-rPDA (Atretic RIMA).  . neck injection      Prior to Admission medications   Medication Sig Start Date End Date Taking? Authorizing Provider  albuterol (PROVENTIL HFA;VENTOLIN HFA) 108 (90 BASE)  MCG/ACT inhaler Inhale 2 puffs into the lungs as needed. Patient taking differently: Inhale 2 puffs into the lungs every 6 (six) hours as needed for wheezing or shortness of breath.  09/08/14   Rubbie Battiest, NP  aspirin 325 MG tablet Take 1 tablet (325 mg total) by mouth daily. 06/04/13   Raquel Dagoberto Ligas, NP  atorvastatin (LIPITOR) 40 MG tablet Take 1 tablet (40 mg total) by mouth daily at 6 PM. Patient  taking differently: Take 40 mg by mouth daily as needed (high cholesterol).  09/29/14   Aldean Jewett, MD  azithromycin (ZITHROMAX Z-PAK) 250 MG tablet Take 2 tablets (500 mg) on  Day 1,  followed by 1 tablet (250 mg) once daily on Days 2 through 5. 01/18/16   Arlyss Repress, PA-C  chlorpheniramine (CHLOR-TRIMETON) 4 MG tablet Take 1 tablet (4 mg total) by mouth 2 (two) times daily as needed for allergies or rhinitis. 01/18/16   Pierce Crane Mallika Sanmiguel, PA-C  fexofenadine-pseudoephedrine (ALLEGRA-D) 60-120 MG 12 hr tablet Take 1 tablet by mouth 2 (two) times daily. 06/13/15   Sable Feil, PA-C  guaiFENesin-codeine 100-10 MG/5ML syrup Take 10 mLs by mouth every 4 (four) hours as needed for cough. 01/18/16   Pierce Crane Mahamud Metts, PA-C  insulin NPH-regular Human (NOVOLIN 70/30) (70-30) 100 UNIT/ML injection 32 units in the AM and 22 units with evening meal 05/04/13   Raquel Dagoberto Ligas, NP  isosorbide mononitrate (IMDUR) 30 MG 24 hr tablet Take 1 tablet (30 mg total) by mouth daily. 08/14/15   Rise Mu, PA-C  metFORMIN (GLUCOPHAGE) 1000 MG tablet Take 1 tablet (1,000 mg total) by mouth 2 (two) times daily with a meal. 09/13/13   Raquel Dagoberto Ligas, NP  metoprolol tartrate (LOPRESSOR) 25 MG tablet Take 1.5 tablets (37.5 mg total) by mouth 2 (two) times daily. 09/03/13   Minna Merritts, MD  naproxen (NAPROSYN) 375 MG tablet Take 1 tablet (375 mg total) by mouth 2 (two) times daily with a meal. 11/21/15 11/20/16  Johnn Hai, PA-C  nitroGLYCERIN (NITROSTAT) 0.4 MG SL tablet Place 1 tablet (0.4 mg total) under the tongue every 5 (five) minutes as needed for chest pain. 09/29/14   Aldean Jewett, MD  oxyCODONE (OXY IR/ROXICODONE) 5 MG immediate release tablet Limit one half to one tab by mouth per day or twice per day if tolerated 12/18/15   Mohammed Kindle, MD  pregabalin (LYRICA) 25 MG capsule Limit 1 tab by mouth per day or twice per day if tolerated  NO GABAPENTIN (NEURONTIN) 12/18/15   Mohammed Kindle, MD  sildenafil (VIAGRA) 100  MG tablet Take 100 mg by mouth daily as needed for erectile dysfunction.    Historical Provider, MD  simvastatin (ZOCOR) 10 MG tablet Take 10 mg by mouth daily at 6 PM.    Historical Provider, MD    Allergies Amoxicillin; Flexeril [cyclobenzaprine]; Methadone; Tramadol; and Trazodone and nefazodone  Family History  Problem Relation Age of Onset  . Heart disease Mother   . Heart disease Father   . Hypertension Father   . Diabetes Sister   . Cancer Sister     skin cancer  . Diabetes Other   . Heart disease Other     Social History Social History  Substance Use Topics  . Smoking status: Current Every Day Smoker    Packs/day: 0.50    Years: 35.00    Types: Cigarettes    Last attempt to quit: 11/24/2012  . Smokeless tobacco:  Never Used     Comment: a pack lasts 2 to 4 days   . Alcohol use 0.0 oz/week     Comment: occasional    Review of Systems Constitutional: No fever/chills Eyes: No visual changes. ENT: Positive sore throat. Positive for nasal congestion. Cardiovascular: Denies chest pain.Positive for coughing. Respiratory: Denies shortness of breath. Positive for coughing. Musculoskeletal: Negative for back pain. Skin: Negative for rash. Neurological: Negative for headaches, focal weakness or numbness.  10-point ROS otherwise negative.  ____________________________________________   PHYSICAL EXAM:  VITAL SIGNS: ED Triage Vitals  Enc Vitals Group     BP 01/18/16 0924 (!) 134/94     Pulse Rate 01/18/16 0924 81     Resp 01/18/16 0924 (!) 2     Temp 01/18/16 0924 98.1 F (36.7 C)     Temp Source 01/18/16 0924 Oral     SpO2 01/18/16 0924 100 %     Weight 01/18/16 0925 170 lb (77.1 kg)     Height 01/18/16 0925 5\' 7"  (1.702 m)     Head Circumference --      Peak Flow --      Pain Score 01/18/16 0925 8     Pain Loc --      Pain Edu? --      Excl. in [place]? --     Constitutional: Alert and oriented. Well appearing and in no acute distress. Head:  Atraumatic. Nose: Mild congestion/rhinnorhea. Mouth/Throat: Mucous membranes are moist.  Oropharynx non-erythematous. Neck: No stridor.   Cardiovascular: Normal rate, regular rhythm. Grossly normal heart sounds.  Good peripheral circulation. Respiratory: Normal respiratory effort.  No retractions. Lungs CTAB. Musculoskeletal: No lower extremity tenderness nor edema.  No joint effusions. Neurologic:  Normal speech and language. No gross focal neurologic deficits are appreciated. No gait instability. Skin:  Skin is warm, dry and intact. No rash noted. Psychiatric: Mood and affect are normal. Speech and behavior are normal.  ____________________________________________   LABS (all labs ordered are listed, but only abnormal results are displayed)  Labs Reviewed - No data to display ____________________________________________  EKG  Patient refuses stating is not his heart. ____________________________________________  RADIOLOGY  No acute cardiopulmonary findings. ____________________________________________   PROCEDURES  Procedure(s) performed: None  Procedures  Critical Care performed: No  ____________________________________________   INITIAL IMPRESSION / ASSESSMENT AND PLAN / ED COURSE  Pertinent labs & imaging results that were available during my care of the patient were reviewed by me and considered in my medical decision making (see chart for details).  Acute upper respiratory infection. Rx given for Z-Pak, Robitussin-AC, chlorpheniramine. Patient follow-up with pain management as scheduled. Follow up with PCP as needed for any other symptoms.  Clinical Course     ____________________________________________   FINAL CLINICAL IMPRESSION(S) / ED DIAGNOSES  Final diagnoses:  URI (upper respiratory infection)      NEW MEDICATIONS STARTED DURING THIS VISIT:  New Prescriptions   AZITHROMYCIN (ZITHROMAX Z-PAK) 250 MG TABLET    Take 2 tablets (500 mg) on   Day 1,  followed by 1 tablet (250 mg) once daily on Days 2 through 5.   CHLORPHENIRAMINE (CHLOR-TRIMETON) 4 MG TABLET    Take 1 tablet (4 mg total) by mouth 2 (two) times daily as needed for allergies or rhinitis.   GUAIFENESIN-CODEINE 100-10 MG/5ML SYRUP    Take 10 mLs by mouth every 4 (four) hours as needed for cough.     Note:  This document was prepared using Systems analyst and  may include unintentional dictation errors.   Arlyss Repress, PA-C 01/18/16 [place], MD 01/18/16 [phone]

## 2016-03-22 ENCOUNTER — Encounter: Payer: Self-pay | Admitting: *Deleted

## 2016-03-22 ENCOUNTER — Emergency Department: Payer: Medicare HMO

## 2016-03-22 ENCOUNTER — Emergency Department
Admission: EM | Admit: 2016-03-22 | Discharge: 2016-03-22 | Disposition: A | Discharge status: Home / Self Care | Payer: Medicare HMO | Attending: Emergency Medicine | Admitting: Emergency Medicine

## 2016-03-22 DIAGNOSIS — R05 Cough: Secondary | ICD-10-CM | POA: Diagnosis present

## 2016-03-22 DIAGNOSIS — Z7982 Long term (current) use of aspirin: Secondary | ICD-10-CM | POA: Diagnosis not present

## 2016-03-22 DIAGNOSIS — I2510 Atherosclerotic heart disease of native coronary artery without angina pectoris: Secondary | ICD-10-CM | POA: Diagnosis not present

## 2016-03-22 DIAGNOSIS — F17210 Nicotine dependence, cigarettes, uncomplicated: Secondary | ICD-10-CM | POA: Insufficient documentation

## 2016-03-22 DIAGNOSIS — J449 Chronic obstructive pulmonary disease, unspecified: Secondary | ICD-10-CM | POA: Insufficient documentation

## 2016-03-22 DIAGNOSIS — Z79899 Other long term (current) drug therapy: Secondary | ICD-10-CM | POA: Diagnosis not present

## 2016-03-22 DIAGNOSIS — Z951 Presence of aortocoronary bypass graft: Secondary | ICD-10-CM | POA: Insufficient documentation

## 2016-03-22 DIAGNOSIS — J45909 Unspecified asthma, uncomplicated: Secondary | ICD-10-CM | POA: Insufficient documentation

## 2016-03-22 DIAGNOSIS — J028 Acute pharyngitis due to other specified organisms: Secondary | ICD-10-CM

## 2016-03-22 DIAGNOSIS — B9789 Other viral agents as the cause of diseases classified elsewhere: Secondary | ICD-10-CM

## 2016-03-22 DIAGNOSIS — J069 Acute upper respiratory infection, unspecified: Secondary | ICD-10-CM | POA: Diagnosis not present

## 2016-03-22 DIAGNOSIS — E119 Type 2 diabetes mellitus without complications: Secondary | ICD-10-CM | POA: Diagnosis not present

## 2016-03-22 DIAGNOSIS — Z794 Long term (current) use of insulin: Secondary | ICD-10-CM | POA: Insufficient documentation

## 2016-03-22 DIAGNOSIS — I10 Essential (primary) hypertension: Secondary | ICD-10-CM | POA: Insufficient documentation

## 2016-03-22 MED ORDER — MAGIC MOUTHWASH: 10.0000 mL | Freq: Three times a day (TID) | ORAL | 0 refills | Status: DC | PRN

## 2016-03-22 MED ORDER — HYDROCOD POLST-CPM POLST ER 10-8 MG/5ML PO SUER
5.0000 mL | Freq: Once | ORAL | Status: AC
  Administered 2016-03-22: 5 mL via ORAL
  Filled 2016-03-22: qty 5

## 2016-03-22 MED ORDER — HYDROCOD POLST-CPM POLST ER 10-8 MG/5ML PO SUER: 5.0000 mL | Freq: Two times a day (BID) | ORAL | 0 refills | Status: DC

## 2016-03-22 MED ORDER — MAGIC MOUTHWASH
10.0000 mL | Freq: Once | ORAL | Status: AC
  Administered 2016-03-22: 10 mL via ORAL
  Filled 2016-03-22: qty 10

## 2016-03-22 NOTE — ED Notes (Signed)

## 2016-03-22 NOTE — ED Provider Notes (Signed)
[facility] Emergency Department Provider Note   ____________________________________________   First MD Initiated Contact with Patient 03/22/16 [phone]     (approximate)  I have reviewed the triage vital signs and the nursing notes.   HISTORY  Chief Complaint Nasal Congestion    HPI PHI OLCZAK is a 55 y.o. male who presents to the ED from home with cold type symptoms. Reports a one-week history of sinus congestion/drainage, dry cough, sore throat, right ear pain. + Sick contacts. Denies associated fever, chest pain, shortness of breath, abdominal pain, nausea, vomiting, diarrhea.Denies recent travel or trauma. Nothing makes his symptoms better or worse.   Past Medical History:  Diagnosis Date  . Allergy   . Asthma   . Chronic back pain 1981   a.followed by pain clinic and neurosurgery.  Marland Kitchen COPD (chronic obstructive pulmonary disease) ([place])   . Coronary artery disease involving native coronary artery    a. 10/2012 CABG x 4 @ [facility] (LIMA->LAD, VG->RI, VG->OM, VG->PDA);  b. 08/2014 Lexiscan MV: small defect of mild severity present along apical lateral & apex location, no st segment changes, LV function 45-54%, poor gated images lead to poor EF & wall motion assessment, low risk study.  . DDD (degenerative disc disease)   . DM2 (diabetes mellitus, type 2) ([place])   . Fainting spell   . GERD (gastroesophageal reflux disease)   . Hepatitis C    2/2 tattoo  . Hyperlipidemia with target LDL less than 70   . Hypertension   . Polysubstance abuse    a. ongoing daily tobacco abuse and etoh (on the weekends), remote crack/cocaine abuse last used approximately in 2006  . S/P CABG x 4    a. LIMA-LAD, SVG-Ramus, SVG-OM,SVG-dRCA  . Urine incontinence     Patient Active Problem List   Diagnosis Date Noted  . DDD (degenerative disc disease), lumbar 01/10/2015  . Facet syndrome, lumbar 01/10/2015  . Lumbar radiculopathy 01/10/2015  . DDD (degenerative  disc disease), cervical 11/22/2014  . Cervical facet syndrome 11/22/2014  . Bilateral occipital neuralgia 11/22/2014  . Hospital discharge follow-up 10/16/2014  . Coronary artery disease involving native coronary artery   . S/P CABG x 4   . Chest pain with moderate risk for cardiac etiology 09/27/2014  . Cervicalgia 07/05/2014  . Acute bacterial rhinosinusitis 06/15/2014  . Non compliance w medication regimen 06/07/2014  . Chest tightness 06/07/2014  . Tobacco abuse counseling 01/05/2014  . Vitamin D deficiency 01/05/2014  . Transaminitis 01/05/2014  . Temporary low platelet count ([place]) 01/05/2014  . Diabetes mellitus type 2, uncontrolled ([place]) 01/05/2014  . Back pain 10/01/2013  . Need for prophylactic vaccination with tetanus-diphtheria (TD) 10/01/2013  . Penile lesion 10/01/2013  . Essential hypertension 08/29/2013  . Hyperlipidemia with target LDL less than 70 08/29/2013  . Callus of foot 08/29/2013  . Blurred vision 08/29/2013  . Itching 08/29/2013  . Exacerbation of chronic back pain 06/07/2013  . Hyperlipidemia 03/02/2013  . Former smoker 03/02/2013  . Routine general medical examination at a health care facility 02/24/2013  . Hepatitis C 02/24/2013  . Diabetes type 2, uncontrolled ([place]) 02/24/2013    Past Surgical History:  Procedure Laterality Date  . CARDIAC CATHETERIZATION  11/2012   [facility] - Multivessel CAD  . CORONARY ARTERY BYPASS GRAFT  8/14   CABG x 4; LIMA-LAD, SVG-RI, SVG-OM1, SVG-rPDA (Atretic RIMA).  . neck injection      Prior to Admission medications   Medication Sig  Start Date End Date Taking? Authorizing Provider  albuterol (PROVENTIL HFA;VENTOLIN HFA) 108 (90 BASE) MCG/ACT inhaler Inhale 2 puffs into the lungs as needed. Patient taking differently: Inhale 2 puffs into the lungs every 6 (six) hours as needed for wheezing or shortness of breath.  09/08/14   Rubbie Battiest, NP  aspirin 325 MG tablet Take 1 tablet (325 mg total) by mouth  daily. 06/04/13   Raquel Dagoberto Ligas, NP  atorvastatin (LIPITOR) 40 MG tablet Take 1 tablet (40 mg total) by mouth daily at 6 PM. Patient taking differently: Take 40 mg by mouth daily as needed (high cholesterol).  09/29/14   Aldean Jewett, MD  azithromycin (ZITHROMAX Z-PAK) 250 MG tablet Take 2 tablets (500 mg) on  Day 1,  followed by 1 tablet (250 mg) once daily on Days 2 through 5. 01/18/16   Arlyss Repress, PA-C  chlorpheniramine (CHLOR-TRIMETON) 4 MG tablet Take 1 tablet (4 mg total) by mouth 2 (two) times daily as needed for allergies or rhinitis. 01/18/16   Arlyss Repress, PA-C  chlorpheniramine-HYDROcodone (TUSSIONEX PENNKINETIC ER) 10-8 MG/5ML SUER Take 5 mLs by mouth 2 (two) times daily. 03/22/16   Paulette Blanch, MD  fexofenadine-pseudoephedrine (ALLEGRA-D) 60-120 MG 12 hr tablet Take 1 tablet by mouth 2 (two) times daily. 06/13/15   Sable Feil, PA-C  guaiFENesin-codeine 100-10 MG/5ML syrup Take 10 mLs by mouth every 4 (four) hours as needed for cough. 01/18/16   Pierce Crane Beers, PA-C  insulin NPH-regular Human (NOVOLIN 70/30) (70-30) 100 UNIT/ML injection 32 units in the AM and 22 units with evening meal 05/04/13   Raquel Dagoberto Ligas, NP  isosorbide mononitrate (IMDUR) 30 MG 24 hr tablet Take 1 tablet (30 mg total) by mouth daily. 08/14/15   Rise Mu, PA-C  magic mouthwash SOLN Take 10 mLs by mouth 3 (three) times daily as needed for mouth pain. 03/22/16   Paulette Blanch, MD  metFORMIN (GLUCOPHAGE) 1000 MG tablet Take 1 tablet (1,000 mg total) by mouth 2 (two) times daily with a meal. 09/13/13   Raquel Dagoberto Ligas, NP  metoprolol tartrate (LOPRESSOR) 25 MG tablet Take 1.5 tablets (37.5 mg total) by mouth 2 (two) times daily. 09/03/13   Minna Merritts, MD  naproxen (NAPROSYN) 375 MG tablet Take 1 tablet (375 mg total) by mouth 2 (two) times daily with a meal. 11/21/15 11/20/16  Johnn Hai, PA-C  nitroGLYCERIN (NITROSTAT) 0.4 MG SL tablet Place 1 tablet (0.4 mg total) under the tongue every 5 (five) minutes as  needed for chest pain. 09/29/14   Aldean Jewett, MD  oxyCODONE (OXY IR/ROXICODONE) 5 MG immediate release tablet Limit one half to one tab by mouth per day or twice per day if tolerated 12/18/15   Mohammed Kindle, MD  pregabalin (LYRICA) 25 MG capsule Limit 1 tab by mouth per day or twice per day if tolerated  NO GABAPENTIN (NEURONTIN) 12/18/15   Mohammed Kindle, MD  sildenafil (VIAGRA) 100 MG tablet Take 100 mg by mouth daily as needed for erectile dysfunction.    Historical Provider, MD  simvastatin (ZOCOR) 10 MG tablet Take 10 mg by mouth daily at 6 PM.    Historical Provider, MD    Allergies Amoxicillin; Flexeril [cyclobenzaprine]; Methadone; Tramadol; and Trazodone and nefazodone  Family History  Problem Relation Age of Onset  . Heart disease Mother   . Heart disease Father   . Hypertension Father   . Diabetes Sister   . Cancer  Sister     skin cancer  . Diabetes Other   . Heart disease Other     Social History Social History  Substance Use Topics  . Smoking status: Current Every Day Smoker    Packs/day: 0.50    Years: 35.00    Types: Cigarettes    Last attempt to quit: 11/24/2012  . Smokeless tobacco: Never Used     Comment: a pack lasts 2 to 4 days   . Alcohol use 0.0 oz/week     Comment: occasional    Review of Systems  Constitutional: No fever/chills. Eyes: No visual changes. ENT: Positive for ear pain and sore throat. Positive for sinus congestion and drainage. Cardiovascular: Denies chest pain. Respiratory: Positive for nonproductive cough. Denies shortness of breath. Gastrointestinal: No abdominal pain.  No nausea, no vomiting.  No diarrhea.  No constipation. Genitourinary: Negative for dysuria. Musculoskeletal: Negative for back pain. Skin: Negative for rash. Neurological: Negative for headaches, focal weakness or numbness.  10-point ROS otherwise negative.  ____________________________________________   PHYSICAL EXAM:  VITAL SIGNS: ED Triage Vitals    Enc Vitals Group     BP 03/22/16 0521 (!) 151/92     Pulse Rate 03/22/16 0521 83     Resp 03/22/16 0521 16     Temp 03/22/16 0521 98.1 F (36.7 C)     Temp Source 03/22/16 0521 Oral     SpO2 03/22/16 0521 99 %     Weight 03/22/16 0522 170 lb (77.1 kg)     Height 03/22/16 0522 5\' 9"  (1.753 m)     Head Circumference --      Peak Flow --      Pain Score 03/22/16 0523 7     Pain Loc --      Pain Edu? --      Excl. in [place]? --     Constitutional: Alert and oriented. Well appearing and in no acute distress. Eyes: Conjunctivae are normal. PERRL. EOMI. Head: Atraumatic. Ears: Bilateral TM dullness. Nose: Congestion/rhinnorhea. Mouth/Throat: Mucous membranes are moist.  Oropharynx mildly erythematous without tonsillar exudate, swelling or peritonsillar abscess. There is no hoarse or muffled voice. There is no drooling. Neck: No stridor.  Supple neck without meningismus. Hematological/Lymphatic/Immunilogical: No cervical lymphadenopathy. Cardiovascular: Normal rate, regular rhythm. Grossly normal heart sounds.  Good peripheral circulation. Respiratory: Normal respiratory effort.  No retractions. Lungs with slight wheeze bibasilarly which clears with inspiration. Gastrointestinal: Soft and nontender. No distention. No abdominal bruits. No CVA tenderness. Musculoskeletal: No lower extremity tenderness nor edema.  No joint effusions. Neurologic:  Normal speech and language. No gross focal neurologic deficits are appreciated. No gait instability. Skin:  Skin is warm, dry and intact. No rash noted. Psychiatric: Mood and affect are normal. Speech and behavior are normal.  ____________________________________________   LABS (all labs ordered are listed, but only abnormal results are displayed)  Labs Reviewed - No data to display ____________________________________________  EKG  ED ECG REPORT I, Aldona Bryner J, the attending physician, personally viewed and interpreted this ECG.   Date:  03/22/2016  EKG Time: 0538  Rate: 83  Rhythm: normal EKG, normal sinus rhythm  Axis: Normal  Intervals:right bundle branch block  ST&T Change: Nonspecific No significant change from 08/2015 ____________________________________________  RADIOLOGY  Chest 2 view (view by me, interpreted per Dr. Gerilyn Nestle): No active cardiopulmonary disease. ____________________________________________   PROCEDURES  Procedure(s) performed: None  Procedures  Critical Care performed: No  ____________________________________________   INITIAL IMPRESSION / ASSESSMENT AND PLAN / ED COURSE  Pertinent labs & imaging results that were available during my care of the patient were reviewed by me and considered in my medical decision making (see chart for details).  55 year old male who presents with viral URI. Chest x-rays negative for pneumonia. Will treat with Magic mouthwash for throat pain, Tussionex for nonproductive cough. Has albuterol inhaler at home. Strict return precautions given. Patient verbalizes understanding and agrees with the care.  Clinical Course      ____________________________________________   FINAL CLINICAL IMPRESSION(S) / ED DIAGNOSES  Final diagnoses:  Viral URI with cough  Sore throat (viral)      NEW MEDICATIONS STARTED DURING THIS VISIT:  New Prescriptions   CHLORPHENIRAMINE-HYDROCODONE (TUSSIONEX PENNKINETIC ER) 10-8 MG/5ML SUER    Take 5 mLs by mouth 2 (two) times daily.   MAGIC MOUTHWASH SOLN    Take 10 mLs by mouth 3 (three) times daily as needed for mouth pain.     Note:  This document was prepared using Dragon voice recognition software and may include unintentional dictation errors.    Paulette Blanch, MD 03/22/16 [phone]

## 2016-03-22 NOTE — ED Notes (Addendum)
Pt. Reports sore throat, hurts to swallow and nasal congestion about 1 week. Reports persistent cough. Reports chills recently.

## 2016-03-22 NOTE — ED Triage Notes (Signed)
Pt c/o sinus congestion/drainage, cough, sore throat, R ear pain and pleurisy w/ cough and inspiration. Pt states he has hx of sinus problems. Pt drove self to ED today. Pt states he is short of breath, in no acute distress at this time, speaking in full sentences and ambulatory to triage.

## 2016-03-22 NOTE — Discharge Instructions (Signed)
1. You may take Magic mouthwash as needed for sore throat. 2. Take Tussionex as needed for cough. 3. Drink plenty of fluids daily. 4. Return to the ER for worsening symptoms, persistent vomiting, difficulty breathing or other concerns.

## 2016-04-09 ENCOUNTER — Emergency Department
Admission: EM | Admit: 2016-04-09 | Discharge: 2016-04-09 | Discharge status: Against Medical Advice | Payer: Medicare HMO

## 2016-05-07 ENCOUNTER — Emergency Department
Admission: EM | Admit: 2016-05-07 | Discharge: 2016-05-07 | Disposition: A | Discharge status: Home / Self Care | Payer: Medicare HMO | Attending: Emergency Medicine | Admitting: Emergency Medicine

## 2016-05-07 ENCOUNTER — Encounter: Payer: Self-pay | Admitting: Emergency Medicine

## 2016-05-07 DIAGNOSIS — G8929 Other chronic pain: Secondary | ICD-10-CM | POA: Insufficient documentation

## 2016-05-07 DIAGNOSIS — Z794 Long term (current) use of insulin: Secondary | ICD-10-CM | POA: Insufficient documentation

## 2016-05-07 DIAGNOSIS — M25512 Pain in left shoulder: Secondary | ICD-10-CM | POA: Insufficient documentation

## 2016-05-07 DIAGNOSIS — Z9119 Patient's noncompliance with other medical treatment and regimen: Secondary | ICD-10-CM | POA: Diagnosis not present

## 2016-05-07 DIAGNOSIS — E119 Type 2 diabetes mellitus without complications: Secondary | ICD-10-CM | POA: Insufficient documentation

## 2016-05-07 DIAGNOSIS — I10 Essential (primary) hypertension: Secondary | ICD-10-CM | POA: Insufficient documentation

## 2016-05-07 DIAGNOSIS — J449 Chronic obstructive pulmonary disease, unspecified: Secondary | ICD-10-CM | POA: Diagnosis not present

## 2016-05-07 DIAGNOSIS — Z7982 Long term (current) use of aspirin: Secondary | ICD-10-CM | POA: Insufficient documentation

## 2016-05-07 DIAGNOSIS — F17210 Nicotine dependence, cigarettes, uncomplicated: Secondary | ICD-10-CM | POA: Diagnosis not present

## 2016-05-07 DIAGNOSIS — Z79899 Other long term (current) drug therapy: Secondary | ICD-10-CM | POA: Insufficient documentation

## 2016-05-07 DIAGNOSIS — Z91199 Patient's noncompliance with other medical treatment and regimen due to unspecified reason: Secondary | ICD-10-CM

## 2016-05-07 DIAGNOSIS — I2510 Atherosclerotic heart disease of native coronary artery without angina pectoris: Secondary | ICD-10-CM | POA: Diagnosis not present

## 2016-05-07 DIAGNOSIS — J45909 Unspecified asthma, uncomplicated: Secondary | ICD-10-CM | POA: Insufficient documentation

## 2016-05-07 DIAGNOSIS — Z955 Presence of coronary angioplasty implant and graft: Secondary | ICD-10-CM | POA: Insufficient documentation

## 2016-05-07 LAB — BASIC METABOLIC PANEL
Anion gap: 6 (ref 5–15)
BUN: 10 mg/dL (ref 6–20)
CHLORIDE: 102 mmol/L (ref 101–111)
CO2: 26 mmol/L (ref 22–32)
CREATININE: 0.69 mg/dL (ref 0.61–1.24)
Calcium: 9 mg/dL (ref 8.9–10.3)
GFR calc Af Amer: >60 mL/min (ref >60)
GFR calc non Af Amer: >60 mL/min (ref >60)
Glucose, Bld: 368 mg/dL — ABNORMAL HIGH (ref 65–99)
Potassium: 4.5 mmol/L (ref 3.5–5.1)
Sodium: 134 mmol/L — ABNORMAL LOW (ref 135–145)

## 2016-05-07 LAB — MAGNESIUM: Magnesium: 2 mg/dL (ref 1.7–2.4)

## 2016-05-07 MED ORDER — MELOXICAM 7.5 MG PO TABS: 7.5000 mg | ORAL_TABLET | Freq: Every day | ORAL | 0 refills | Status: DC

## 2016-05-07 NOTE — Discharge Instructions (Signed)
Keep your appointment with your endocrinologist tomorrow and take care of your diabetes medicine as directed. Begin meloxicam 7.5 mg one daily with food. Moist heat or ice to your shoulder as needed for discomfort. Also keep your appointment at the [facility].

## 2016-05-07 NOTE — ED Provider Notes (Signed)
[facility] Emergency Department Provider Note  ____________________________________________   First MD Initiated Contact with Patient 05/07/16 1222     (approximate)  I have reviewed the triage vital signs and the nursing notes.   HISTORY  Chief Complaint Shoulder Pain    HPI Kyle [name] is a 56 y.o. male is here with complaint of left shoulder pain for 2 weeks.Patient states that he has chronic pain and degenerative disc disease. Patient was being seen at pain management but when his doctor left the practice he has not been seen since.  Patient states he has not had any pain medication since that time. Patient has a primary care doctor that has not been seen. He states that he has an appointment with his endocrinologist tomorrow. Patient denies any changes in his left shoulder pain. He states that he feels some muscle cramps occasionally. Patient states he has not taken his diabetes medicine last 3 days because "he forgets". Patient states is not unusual for his blood sugar to be elevated. Currently he rates his pain as 6/10. Pain is increased with range of motion of the shoulder.   Past Medical History:  Diagnosis Date  . Allergy   . Asthma   . Chronic back pain 1981   a.followed by pain clinic and neurosurgery.  Marland Kitchen COPD (chronic obstructive pulmonary disease) ([place])   . Coronary artery disease involving native coronary artery    a. 10/2012 CABG x 4 @ [facility] (LIMA->LAD, VG->RI, VG->OM, VG->PDA);  b. 08/2014 Lexiscan MV: small defect of mild severity present along apical lateral & apex location, no st segment changes, LV function 45-54%, poor gated images lead to poor EF & wall motion assessment, low risk study.  . DDD (degenerative disc disease)   . DM2 (diabetes mellitus, type 2) ([place])   . Fainting spell   . GERD (gastroesophageal reflux disease)   . Hepatitis C    2/2 tattoo  . Hyperlipidemia with target LDL less than 70   . Hypertension     . Polysubstance abuse    a. ongoing daily tobacco abuse and etoh (on the weekends), remote crack/cocaine abuse last used approximately in 2006  . S/P CABG x 4    a. LIMA-LAD, SVG-Ramus, SVG-OM,SVG-dRCA  . Urine incontinence     Patient Active Problem List   Diagnosis Date Noted  . DDD (degenerative disc disease), lumbar 01/10/2015  . Facet syndrome, lumbar 01/10/2015  . Lumbar radiculopathy 01/10/2015  . DDD (degenerative disc disease), cervical 11/22/2014  . Cervical facet syndrome 11/22/2014  . Bilateral occipital neuralgia 11/22/2014  . Hospital discharge follow-up 10/16/2014  . Coronary artery disease involving native coronary artery   . S/P CABG x 4   . Chest pain with moderate risk for cardiac etiology 09/27/2014  . Cervicalgia 07/05/2014  . Acute bacterial rhinosinusitis 06/15/2014  . Non compliance w medication regimen 06/07/2014  . Chest tightness 06/07/2014  . Tobacco abuse counseling 01/05/2014  . Vitamin D deficiency 01/05/2014  . Transaminitis 01/05/2014  . Temporary low platelet count ([place]) 01/05/2014  . Diabetes mellitus type 2, uncontrolled ([place]) 01/05/2014  . Back pain 10/01/2013  . Need for prophylactic vaccination with tetanus-diphtheria (TD) 10/01/2013  . Penile lesion 10/01/2013  . Essential hypertension 08/29/2013  . Hyperlipidemia with target LDL less than 70 08/29/2013  . Callus of foot 08/29/2013  . Blurred vision 08/29/2013  . Itching 08/29/2013  . Exacerbation of chronic back pain 06/07/2013  . Hyperlipidemia 03/02/2013  . Former smoker  03/02/2013  . Routine general medical examination at a health care facility 02/24/2013  . Hepatitis C 02/24/2013  . Diabetes type 2, uncontrolled ([place]) 02/24/2013    Past Surgical History:  Procedure Laterality Date  . CARDIAC CATHETERIZATION  11/2012   [facility] - Multivessel CAD  . CORONARY ARTERY BYPASS GRAFT  8/14   CABG x 4; LIMA-LAD, SVG-RI, SVG-OM1, SVG-rPDA (Atretic RIMA).  . neck  injection      Prior to Admission medications   Medication Sig Start Date End Date Taking? Authorizing Provider  aspirin 325 MG tablet Take 1 tablet (325 mg total) by mouth daily. 06/04/13   Raquel Dagoberto Ligas, NP  atorvastatin (LIPITOR) 40 MG tablet Take 1 tablet (40 mg total) by mouth daily at 6 PM. Patient taking differently: Take 40 mg by mouth daily as needed (high cholesterol).  09/29/14   Aldean Jewett, MD  azithromycin (ZITHROMAX Z-PAK) 250 MG tablet Take 2 tablets (500 mg) on  Day 1,  followed by 1 tablet (250 mg) once daily on Days 2 through 5. 01/18/16   Arlyss Repress, PA-C  chlorpheniramine (CHLOR-TRIMETON) 4 MG tablet Take 1 tablet (4 mg total) by mouth 2 (two) times daily as needed for allergies or rhinitis. 01/18/16   Arlyss Repress, PA-C  chlorpheniramine-HYDROcodone (TUSSIONEX PENNKINETIC ER) 10-8 MG/5ML SUER Take 5 mLs by mouth 2 (two) times daily. 03/22/16   Paulette Blanch, MD  fexofenadine-pseudoephedrine (ALLEGRA-D) 60-120 MG 12 hr tablet Take 1 tablet by mouth 2 (two) times daily. 06/13/15   Sable Feil, PA-C  guaiFENesin-codeine 100-10 MG/5ML syrup Take 10 mLs by mouth every 4 (four) hours as needed for cough. 01/18/16   Pierce Crane Beers, PA-C  insulin NPH-regular Human (NOVOLIN 70/30) (70-30) 100 UNIT/ML injection 32 units in the AM and 22 units with evening meal 05/04/13   Raquel Dagoberto Ligas, NP  isosorbide mononitrate (IMDUR) 30 MG 24 hr tablet Take 1 tablet (30 mg total) by mouth daily. 08/14/15   Rise Mu, PA-C  magic mouthwash SOLN Take 10 mLs by mouth 3 (three) times daily as needed for mouth pain. 03/22/16   Paulette Blanch, MD  meloxicam (MOBIC) 7.5 MG tablet Take 1 tablet (7.5 mg total) by mouth daily. 05/07/16 05/07/17  Johnn Hai, PA-C  metFORMIN (GLUCOPHAGE) 1000 MG tablet Take 1 tablet (1,000 mg total) by mouth 2 (two) times daily with a meal. 09/13/13   Raquel Dagoberto Ligas, NP  metoprolol tartrate (LOPRESSOR) 25 MG tablet Take 1.5 tablets (37.5 mg total) by mouth 2 (two) times daily.  09/03/13   Minna Merritts, MD  naproxen (NAPROSYN) 375 MG tablet Take 1 tablet (375 mg total) by mouth 2 (two) times daily with a meal. 11/21/15 11/20/16  Johnn Hai, PA-C  nitroGLYCERIN (NITROSTAT) 0.4 MG SL tablet Place 1 tablet (0.4 mg total) under the tongue every 5 (five) minutes as needed for chest pain. 09/29/14   Aldean Jewett, MD  oxyCODONE (OXY IR/ROXICODONE) 5 MG immediate release tablet Limit one half to one tab by mouth per day or twice per day if tolerated 12/18/15   Mohammed Kindle, MD  pregabalin (LYRICA) 25 MG capsule Limit 1 tab by mouth per day or twice per day if tolerated  NO GABAPENTIN (NEURONTIN) 12/18/15   Mohammed Kindle, MD  sildenafil (VIAGRA) 100 MG tablet Take 100 mg by mouth daily as needed for erectile dysfunction.    Historical Provider, MD  simvastatin (ZOCOR) 10 MG tablet Take  10 mg by mouth daily at 6 PM.    Historical Provider, MD    Allergies Amoxicillin; Flexeril [cyclobenzaprine]; Methadone; Tramadol; and Trazodone and nefazodone  Family History  Problem Relation Age of Onset  . Heart disease Mother   . Heart disease Father   . Hypertension Father   . Diabetes Sister   . Cancer Sister     skin cancer  . Diabetes Other   . Heart disease Other     Social History Social History  Substance Use Topics  . Smoking status: Current Every Day Smoker    Packs/day: 0.50    Years: 35.00    Types: Cigarettes    Last attempt to quit: 11/24/2012  . Smokeless tobacco: Never Used     Comment: a pack lasts 2 to 4 days   . Alcohol use 0.0 oz/week     Comment: occasional    Review of Systems Constitutional: No fever/chills Eyes: No visual changes. ENT: No sore throat. Cardiovascular: Denies chest pain. Respiratory: Denies shortness of breath. Gastrointestinal: No abdominal pain.  No nausea, no vomiting.  No diarrhea.  No constipation. Genitourinary: Negative for dysuria. Musculoskeletal: Negative for back pain. Positive for left shoulder pain. Positive  for muscle cramps. Skin: Negative for rash. Neurological: Negative for headaches, focal weakness or numbness.  10-point ROS otherwise negative.  ____________________________________________   PHYSICAL EXAM:  VITAL SIGNS: ED Triage Vitals [05/07/16 1051]  Enc Vitals Group     BP (!) 140/14     Pulse Rate 95     Resp 18     Temp 98.1 F (36.7 C)     Temp Source Oral     SpO2 99 %     Weight 180 lb (81.6 kg)     Height 5\' 7"  (1.702 m)     Head Circumference      Peak Flow      Pain Score 6     Pain Loc      Pain Edu?      Excl. in [place]?     Constitutional: Alert and oriented. Well appearing and in no acute distress. Eyes: Conjunctivae are normal. PERRL. EOMI. Head: Atraumatic. Nose: No congestion/rhinnorhea. Neck: No stridor.  No tenderness on palpation of cervical spine posteriorly. No deformity or soft tissue swelling present. Hematological/Lymphatic/Immunilogical: No cervical lymphadenopathy. Cardiovascular: Normal rate, regular rhythm. Grossly normal heart sounds.  Good peripheral circulation. Respiratory: Normal respiratory effort.  No retractions. Lungs CTAB. Musculoskeletal: Moves upper and lower extremities with no difficulty. There is tenderness on palpation of the left trapezius muscles. Left shoulder range of motion is without restriction or crepitus. Grip strength is equal bilaterally. Pulses positive. Motor sensory function intact. Neurologic:  Normal speech and language. No gross focal neurologic deficits are appreciated. No gait instability. Skin:  Skin is warm, dry and intact. No rash noted. Psychiatric: Mood and affect are normal. Speech and behavior are normal.  ____________________________________________   LABS (all labs ordered are listed, but only abnormal results are displayed)  Labs Reviewed  BASIC METABOLIC PANEL - Abnormal; Notable for the following:       Result Value   Sodium 134 (*)    Glucose, Bld 368 (*)    All other components within  normal limits  MAGNESIUM    PROCEDURES  Procedure(s) performed: None  Procedures  Critical Care performed: No  ____________________________________________   INITIAL IMPRESSION / ASSESSMENT AND PLAN / ED COURSE  Pertinent labs & imaging results that were available during my care  of the patient were reviewed by me and considered in my medical decision making (see chart for details).    Clinical Course    BASIC metabolic panel was within normal limits with exception of glucose which was 368. Magnesium was 2.0. When questioned about his glucose level patient states that he often forgets to take his diabetes medicine and is aware that he also is noncompliant with a lot of his medication. Patient states he has an appointment with his endocrinologist tomorrow. He was encouraged to take his diabetes more seriously and to remember to take his medication as prescribed by his endocrinologist. Patient was given a prescription for meloxicam 7.5 one daily. Patient states he also has an appointment with the [facility].  ____________________________________________   FINAL CLINICAL IMPRESSION(S) / ED DIAGNOSES  Final diagnoses:  Chronic left shoulder pain  Noncompliance with diabetes treatment      NEW MEDICATIONS STARTED DURING THIS VISIT:  Discharge Medication List as of 05/07/2016  1:35 PM    START taking these medications   Details  meloxicam (MOBIC) 7.5 MG tablet Take 1 tablet (7.5 mg total) by mouth daily., Starting Tue 05/07/2016, Until Wed 05/07/2017, Print         Note:  This document was prepared using Dragon voice recognition software and may include unintentional dictation errors.    Johnn Hai, PA-C 05/07/16 [place], MD 05/11/16 [phone]

## 2016-05-07 NOTE — ED Notes (Signed)
Pt in via triage with complaints of left shoulder and neck pain x approximately 2 weeks; pt reports "when I turn my head to the left, my vision gets very blurry."  Pt also reports dizziness with turning his head to he left.  Pt denies any hx of vertigo; reports his pain management MD has recently been "dismissed" and has not had anything for pain.

## 2016-05-07 NOTE — ED Triage Notes (Addendum)
Patient presents to the ED with left shoulder pain and headache when turning his head to the left.  Patient reports history of chronic pain and degenerative disc disease.  Patient states, "I used to have a pain manager but then my doctor got dismissed."  Patient is in no obvious distress at this time.  Ambulatory to triage.  Patient reports symptoms x 2 weeks.

## 2016-05-16 ENCOUNTER — Ambulatory Visit: Payer: Medicare HMO | Admitting: Cardiovascular Disease

## 2016-05-17 ENCOUNTER — Encounter: Payer: Self-pay | Admitting: Cardiovascular Disease

## 2016-05-17 ENCOUNTER — Ambulatory Visit (INDEPENDENT_AMBULATORY_CARE_PROVIDER_SITE_OTHER): Payer: Medicare HMO | Admitting: Cardiovascular Disease

## 2016-05-17 VITALS — BP 158/98 | HR 86 | Ht 67.0 in | Wt 180.5 lb

## 2016-05-17 DIAGNOSIS — E785 Hyperlipidemia, unspecified: Secondary | ICD-10-CM | POA: Diagnosis not present

## 2016-05-17 DIAGNOSIS — I25810 Atherosclerosis of coronary artery bypass graft(s) without angina pectoris: Secondary | ICD-10-CM | POA: Diagnosis not present

## 2016-05-17 DIAGNOSIS — Z951 Presence of aortocoronary bypass graft: Secondary | ICD-10-CM

## 2016-05-17 DIAGNOSIS — Z794 Long term (current) use of insulin: Secondary | ICD-10-CM

## 2016-05-17 DIAGNOSIS — I10 Essential (primary) hypertension: Secondary | ICD-10-CM | POA: Diagnosis not present

## 2016-05-17 DIAGNOSIS — IMO0002 Reserved for concepts with insufficient information to code with codable children: Secondary | ICD-10-CM

## 2016-05-17 DIAGNOSIS — F17200 Nicotine dependence, unspecified, uncomplicated: Secondary | ICD-10-CM

## 2016-05-17 DIAGNOSIS — E1165 Type 2 diabetes mellitus with hyperglycemia: Secondary | ICD-10-CM

## 2016-05-17 DIAGNOSIS — E1159 Type 2 diabetes mellitus with other circulatory complications: Secondary | ICD-10-CM

## 2016-05-17 MED ORDER — METOPROLOL TARTRATE 25 MG PO TABS: 37.5000 mg | ORAL_TABLET | Freq: Two times a day (BID) | ORAL | 3 refills | Status: DC

## 2016-05-17 MED ORDER — ISOSORBIDE MONONITRATE ER 30 MG PO TB24: 30.0000 mg | ORAL_TABLET | Freq: Every day | ORAL | 3 refills | Status: DC

## 2016-05-17 MED ORDER — SIMVASTATIN 40 MG PO TABS: 40.0000 mg | ORAL_TABLET | Freq: Every day | ORAL | 3 refills | Status: DC

## 2016-05-17 NOTE — Patient Instructions (Signed)
Medication Instructions:   Please change aspirin to 81 mg x 2 daily Please stay on simvastatin one a day for cholesterol Restart the isosorbide one a day for blood pressure  Labwork:  No new labs needed  Testing/Procedures:  No further testing at this time   I recommend watching educational videos on topics of interest to you at:       [email or web address]  Enter code: [email or web address]    Follow-Up: It was a pleasure seeing you in the office today. Please call us if you have new issues that need to be addressed before your next appt.  [phone]  Your physician wants you to follow-up in: 6 months.  You will receive a reminder letter in the mail two months in advance. If you don't receive a letter, please call our office to schedule the follow-up appointment.  If you need a refill on your cardiac medications before your next appointment, please call your pharmacy.

## 2016-05-17 NOTE — Progress Notes (Signed)
[facility] Office Note  Date:  05/17/2016   ID:  Kyle [name], Kyle [name] 1960-11-28, MRN [id number]  PCP:  Marden Noble, MD   Chief Complaint  Patient presents with  . other     OD f/u. Last OV 08/14/15. Pt states he is doing well. Reviewed meds with pt verbally.    HPI:  Kyle [name] is a 56 year old gentleman with long history of poorly controlled diabetes, long history of smoking who continues to smoke, hyperlipidemia, coronary artery disease, bypass surgery. He presents for routine followup of his coronary artery disease   in the past has run out of his medications  He does receive some of his medical care through the [facility] He reports that he does not have his isosorbide He has Lipitor and simvastatin on his list, thinks he is taking the simvastatin Reports his sugars are running "a little high" He denies having any significant chest pain on exertion, no shortness of breath He continues to smoke  Review of lab work with him shows HBA1C 12.3 Total chol 162, TG 443 CR 0.7  EKG shows normal sinus rhythm with rate 86 beats per minute, right bundle branch block, left anterior fascicular block, unchanged from previous EKGs   Other past medical history reviewed Previously had worsening shortness of breath, fatigue, slight chest pain prior to his  bypass surgery. He had four-vessel bypass in July 2014 at the [facility] in [place]. Hospital is called the [facility].     PMH:   has a past medical history of Allergy; Asthma; Chronic back pain (1981); COPD (chronic obstructive pulmonary disease) ([place]); Coronary artery disease involving native coronary artery; DDD (degenerative disc disease); DM2 (diabetes mellitus, type 2) ([place]); Fainting spell; GERD (gastroesophageal reflux disease); Hepatitis C; Hyperlipidemia with target LDL less than 70; Hypertension; Polysubstance abuse; S/P CABG x 4; and Urine incontinence.  PSH:    Past Surgical History:  Procedure  Laterality Date  . CARDIAC CATHETERIZATION  11/2012   [facility] - Multivessel CAD  . CORONARY ARTERY BYPASS GRAFT  8/14   CABG x 4; LIMA-LAD, SVG-RI, SVG-OM1, SVG-rPDA (Atretic RIMA).  . neck injection      Current Outpatient Prescriptions  Medication Sig Dispense Refill  . azithromycin (ZITHROMAX Z-PAK) 250 MG tablet Take 2 tablets (500 mg) on  Day 1,  followed by 1 tablet (250 mg) once daily on Days 2 through 5. 6 each 0  . chlorpheniramine (CHLOR-TRIMETON) 4 MG tablet Take 1 tablet (4 mg total) by mouth 2 (two) times daily as needed for allergies or rhinitis. 30 tablet 0  . chlorpheniramine-HYDROcodone (TUSSIONEX PENNKINETIC ER) 10-8 MG/5ML SUER Take 5 mLs by mouth 2 (two) times daily. 70 mL 0  . fexofenadine-pseudoephedrine (ALLEGRA-D) 60-120 MG 12 hr tablet Take 1 tablet by mouth 2 (two) times daily. 30 tablet 0  . guaiFENesin-codeine 100-10 MG/5ML syrup Take 10 mLs by mouth every 4 (four) hours as needed for cough. 180 mL 0  . insulin NPH-regular Human (NOVOLIN 70/30) (70-30) 100 UNIT/ML injection 32 units in the AM and 22 units with evening meal    . isosorbide mononitrate (IMDUR) 30 MG 24 hr tablet Take 1 tablet (30 mg total) by mouth daily. 90 tablet 3  . magic mouthwash SOLN Take 10 mLs by mouth 3 (three) times daily as needed for mouth pain. 60 mL 0  . meloxicam (MOBIC) 7.5 MG tablet Take 1 tablet (7.5 mg total) by mouth daily. 14 tablet 0  .  metFORMIN (GLUCOPHAGE) 1000 MG tablet Take 1 tablet (1,000 mg total) by mouth 2 (two) times daily with a meal. 60 tablet 5  . metoprolol tartrate (LOPRESSOR) 25 MG tablet Take 1.5 tablets (37.5 mg total) by mouth 2 (two) times daily. 270 tablet 3  . naproxen (NAPROSYN) 375 MG tablet Take 1 tablet (375 mg total) by mouth 2 (two) times daily with a meal. 14 tablet 2  . nitroGLYCERIN (NITROSTAT) 0.4 MG SL tablet Place 1 tablet (0.4 mg total) under the tongue every 5 (five) minutes as needed for chest pain. 21 tablet 12  . oxyCODONE (OXY  IR/ROXICODONE) 5 MG immediate release tablet Limit one half to one tab by mouth per day or twice per day if tolerated 60 tablet 0  . pregabalin (LYRICA) 25 MG capsule Limit 1 tab by mouth per day or twice per day if tolerated  NO GABAPENTIN (NEURONTIN) 60 capsule 0  . sildenafil (VIAGRA) 100 MG tablet Take 100 mg by mouth daily as needed for erectile dysfunction.    . simvastatin (ZOCOR) 40 MG tablet Take 1 tablet (40 mg total) by mouth daily at 6 PM. 90 tablet 3  . aspirin EC 81 MG tablet Take 1 tablet (81 mg total) by mouth daily. 90 tablet 3   Current Facility-Administered Medications  Medication Dose Route Frequency Provider Last Rate Last Dose  . bupivacaine (PF) (MARCAINE) 0.25 % injection 30 mL  30 mL Other Once Mohammed Kindle, MD      . ciprofloxacin (CIPRO) IVPB 400 mg  400 mg Intravenous Once Mohammed Kindle, MD      . ciprofloxacin (CIPRO) IVPB 400 mg  400 mg Intravenous Once Mohammed Kindle, MD      . ciprofloxacin (CIPRO) tablet 250 mg  250 mg Oral BID Mohammed Kindle, MD      . fentaNYL (SUBLIMAZE) injection 100 mcg  100 mcg Intravenous Once Mohammed Kindle, MD      . lactated ringers infusion 1,000 mL  1,000 mL Intravenous Continuous Mohammed Kindle, MD      . lactated ringers infusion 1,000 mL  1,000 mL Intravenous Continuous Mohammed Kindle, MD      . midazolam (VERSED) 5 MG/5ML injection 5 mg  5 mg Intravenous Once Mohammed Kindle, MD      . midazolam (VERSED) 5 MG/5ML injection 5 mg  5 mg Intravenous Once Mohammed Kindle, MD      . orphenadrine (NORFLEX) injection 60 mg  60 mg Intramuscular Once Mohammed Kindle, MD      . orphenadrine (NORFLEX) injection 60 mg  60 mg Intramuscular Once Mohammed Kindle, MD      . sodium chloride flush (NS) 0.9 % injection 20 mL  20 mL Other Once Mohammed Kindle, MD      . triamcinolone acetonide (KENALOG-40) injection 40 mg  40 mg Other Once Mohammed Kindle, MD         Allergies:   Amoxicillin; Flexeril [cyclobenzaprine]; Methadone; Tramadol; and Trazodone and  nefazodone   Social History:  The patient  reports that he has been smoking Cigarettes.  He has a 8.75 pack-year smoking history. He has never used smokeless tobacco. He reports that he drinks alcohol. He reports that he does not use drugs.   Family History:   family history includes Cancer in his sister; Diabetes in his other and sister; Heart disease in his father, mother, and other; Hypertension in his father.    Review of Systems: Review of Systems  Constitutional: Negative.   Respiratory: Negative.  Cardiovascular: Negative.   Gastrointestinal: Negative.   Musculoskeletal: Negative.   Neurological: Negative.   Psychiatric/Behavioral: Negative.   All other systems reviewed and are negative.    PHYSICAL EXAM: VS:  BP (!) 158/98 (BP Location: Left Arm, Patient Position: Sitting, Cuff Size: Normal)   Pulse 86   Ht 5\' 7"  (1.702 m)   Wt 180 lb 8 oz (81.9 kg)   BMI 28.27 kg/m  , BMI Body mass index is 28.27 kg/m. GEN: Well nourished, well developed, in no acute distress  HEENT: normal  Neck: no JVD, carotid bruits, or masses Cardiac: RRR; no murmurs, rubs, or gallops,no edema  Respiratory:  clear to auscultation bilaterally, normal work of breathing GI: soft, nontender, nondistended, + BS MS: no deformity or atrophy  Skin: warm and dry, no rash Neuro:  Strength and sensation are intact Psych: euthymic mood, full affect    Recent Labs: 09/20/2015: ALT 78; Hemoglobin 14.4; Platelets 136 05/07/2016: BUN 10; Creatinine, Ser 0.69; Magnesium 2.0; Potassium 4.5; Sodium 134    Lipid Panel Lab Results  Component Value Date   CHOL 149 08/14/2015   HDL 50 08/14/2015   LDLCALC 78 08/14/2015   TRIG 106 08/14/2015      Wt Readings from Last 3 Encounters:  05/17/16 180 lb 8 oz (81.9 kg)  05/07/16 180 lb (81.6 kg)  03/22/16 170 lb (77.1 kg)       ASSESSMENT AND PLAN:  Coronary artery disease involving coronary bypass graft of native heart without angina pectoris - Plan:  EKG 12-Lead Currently with no symptoms of angina. No further workup at this time.  He reports taking simvastatin, we have renewed this increase the dose up to 40 mg daily. Recommended he decrease his aspirin dosing down from 325 to 81 mg  Essential hypertension Blood pressure markedly elevated, He is on metoprolol. We will renew his isosorbide mononitrate 30 mg daily. Recommended he monitor blood pressure closely and call our office if this continues to run high  Hyperlipidemia with target LDL less than 70 Encouraged him to stay on his Zocor 40 mg daily  Uncontrolled type 2 diabetes mellitus with other circulatory complication, with long-term current use of insulin ([place]) Poorly controlled diabetes, A1c of 12 Stress to him the importance of better diabetes control, medication compliance Discussed complications such as retinopathy, nephropathy, neuropathy  Smoker We have encouraged him to continue to work on weaning his cigarettes and smoking cessation. He will continue to work on this and does not want any assistance with chantix.   S/P CABG x 4 Previous surgical anatomy discussed with him   Total encounter time more than 25 minutes  Greater than 50% was spent in counseling and coordination of care with the patient   Disposition:   F/U  6 months   Orders Placed This Encounter  Procedures  . EKG 12-Lead     Signed, Esmond Plants, M.D., Ph.D. 05/17/2016  [place], [place]

## 2016-07-02 IMAGING — CR DG THORACIC SPINE 2V
1 series · 3 of 3 positions shown · non-contrast
Comparison: Chest radiographs 09/27/2014 and earlier. Cervical
spine series 07/05/2014

CLINICAL DATA: 54-year-old male with cough, fever, thoracolumbar
back pain for several days. Initial encounter.

EXAM:
THORACIC SPINE 2 VIEWS

[Series 1: dg thoracic spine 2 view · 0.14mm/px · 3 of 3 slices shown]
[im 1/3]
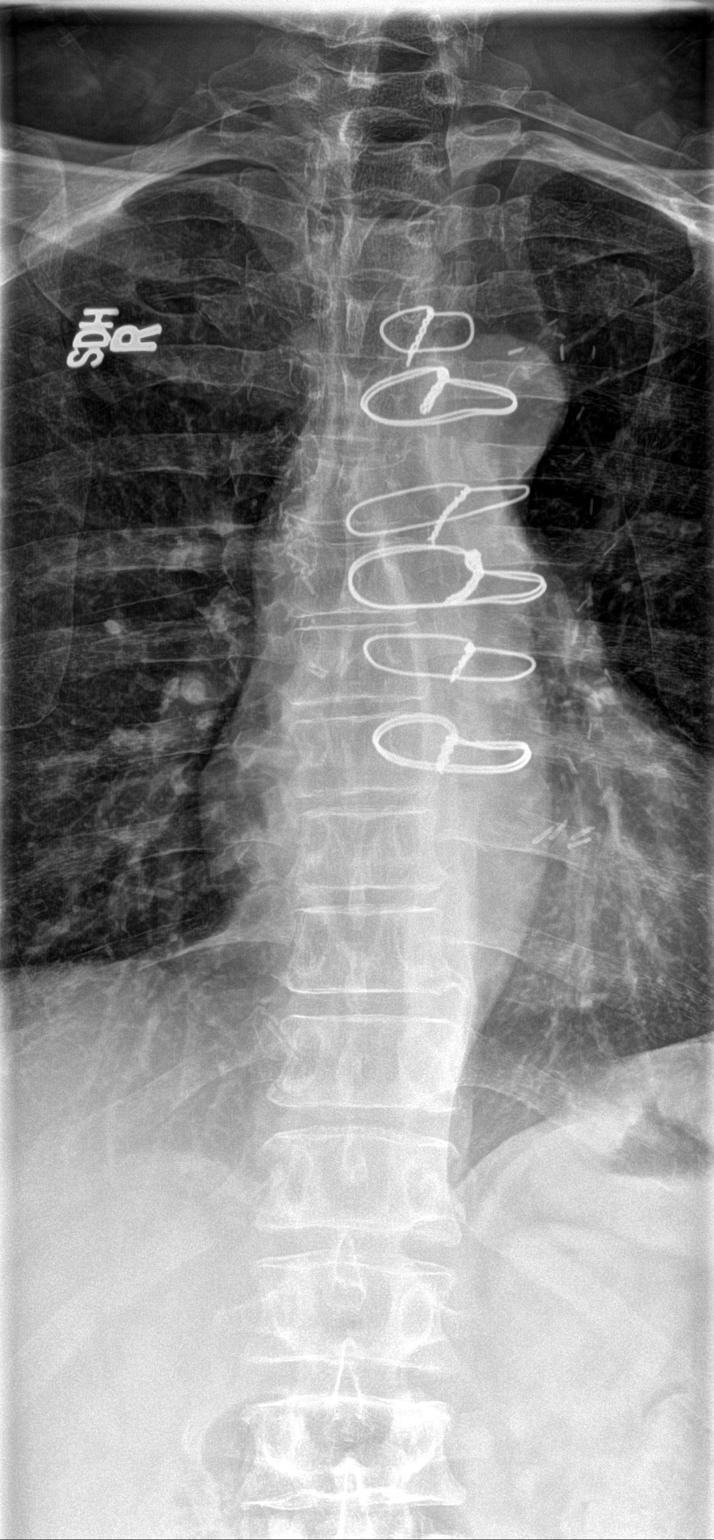
[im 2/3]
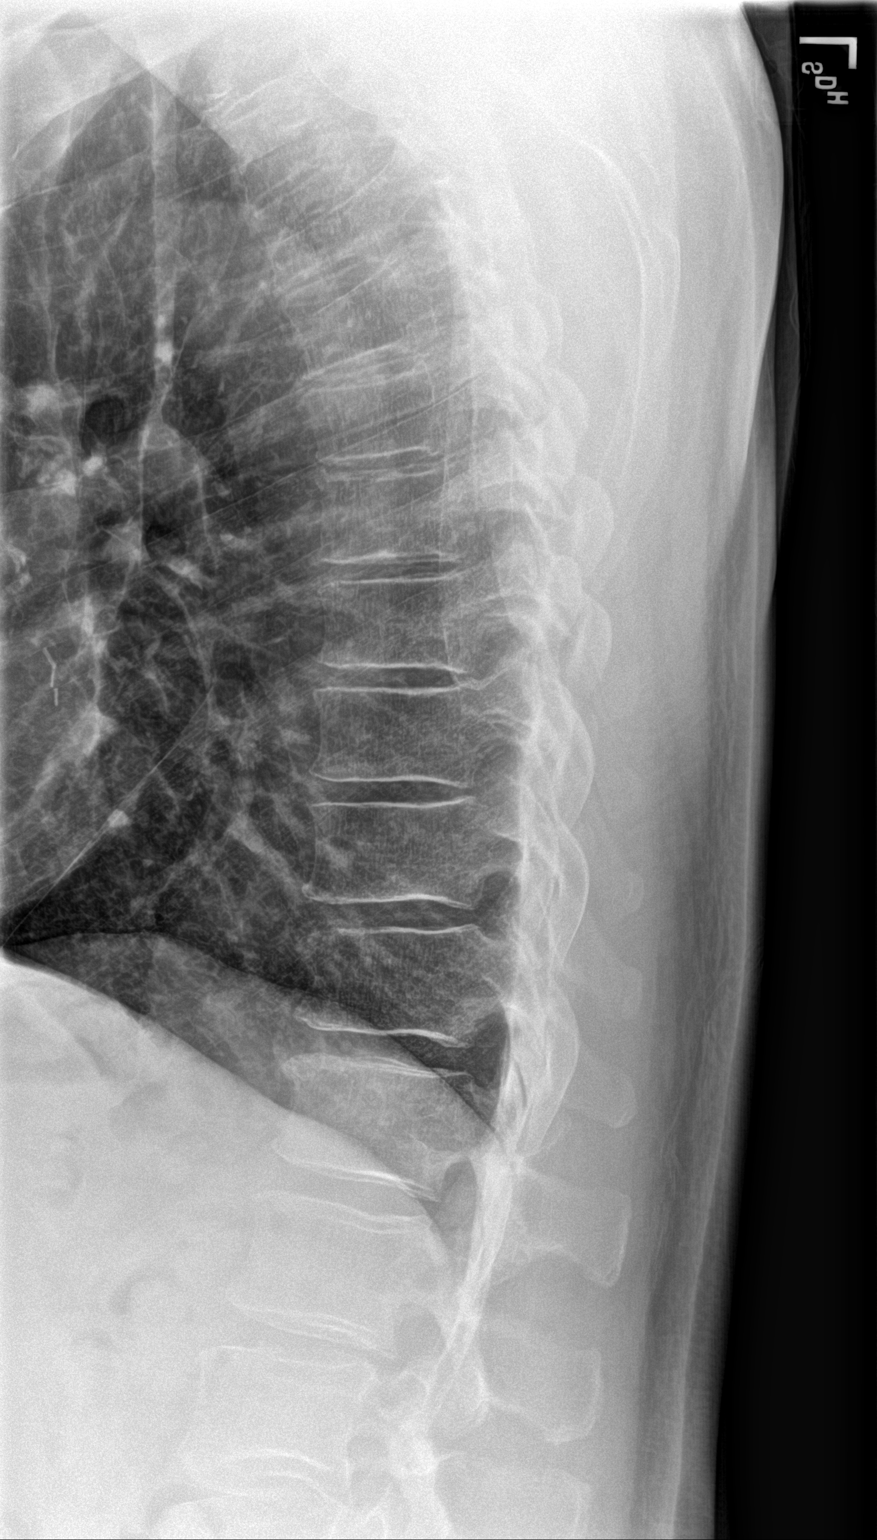
[im 3/3]
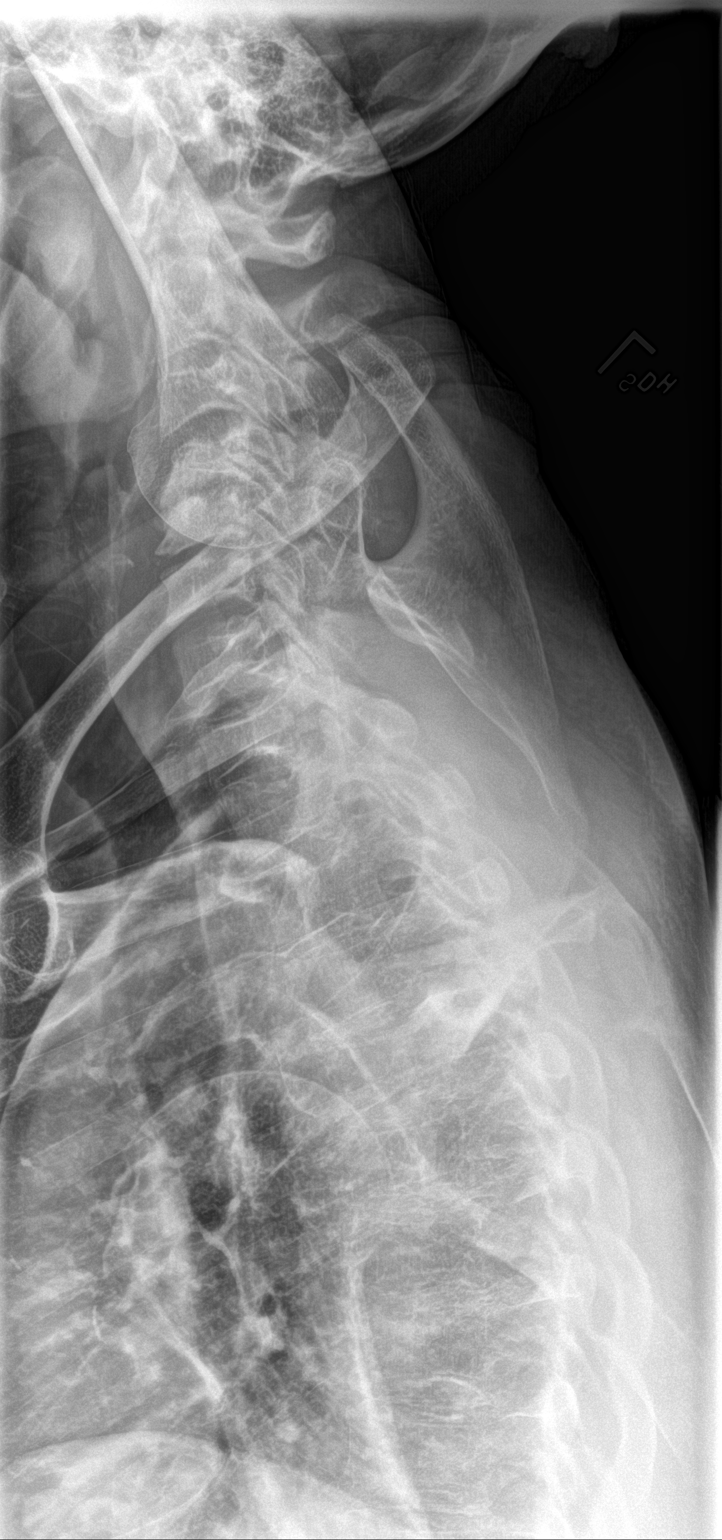

[3 of 3 positions shown; findings below may reference images not displayed]

FINDINGS: Sequelae of median sternotomy and CABG again noted. Incidental C7
cervical ribs. Normal thoracic segmentation. Stable and normal
thoracic vertebral height and alignment. Stable relatively preserved
thoracic disc spaces. Cervicothoracic junction alignment is within
normal limits. Grossly stable visualized thoracic visceral contours.
The right anterior first rib may be bifid. No acute posterior rib
fracture identified.
IMPRESSION: No acute osseous abnormality identified in the thoracic spine.

## 2016-08-20 ENCOUNTER — Emergency Department
Admission: EM | Admit: 2016-08-20 | Discharge: 2016-08-20 | Disposition: A | Discharge status: Home / Self Care | Payer: Medicare HMO | Attending: Emergency Medicine | Admitting: Emergency Medicine

## 2016-08-20 ENCOUNTER — Encounter: Payer: Self-pay | Admitting: Emergency Medicine

## 2016-08-20 DIAGNOSIS — Z7982 Long term (current) use of aspirin: Secondary | ICD-10-CM | POA: Diagnosis not present

## 2016-08-20 DIAGNOSIS — J45909 Unspecified asthma, uncomplicated: Secondary | ICD-10-CM | POA: Insufficient documentation

## 2016-08-20 DIAGNOSIS — F17210 Nicotine dependence, cigarettes, uncomplicated: Secondary | ICD-10-CM | POA: Diagnosis not present

## 2016-08-20 DIAGNOSIS — E119 Type 2 diabetes mellitus without complications: Secondary | ICD-10-CM | POA: Insufficient documentation

## 2016-08-20 DIAGNOSIS — J449 Chronic obstructive pulmonary disease, unspecified: Secondary | ICD-10-CM | POA: Insufficient documentation

## 2016-08-20 DIAGNOSIS — M542 Cervicalgia: Secondary | ICD-10-CM

## 2016-08-20 DIAGNOSIS — M5090 Cervical disc disorder, unspecified, unspecified cervical region: Secondary | ICD-10-CM | POA: Diagnosis not present

## 2016-08-20 DIAGNOSIS — Z794 Long term (current) use of insulin: Secondary | ICD-10-CM | POA: Insufficient documentation

## 2016-08-20 DIAGNOSIS — I10 Essential (primary) hypertension: Secondary | ICD-10-CM | POA: Insufficient documentation

## 2016-08-20 DIAGNOSIS — I2510 Atherosclerotic heart disease of native coronary artery without angina pectoris: Secondary | ICD-10-CM | POA: Insufficient documentation

## 2016-08-20 DIAGNOSIS — M5030 Other cervical disc degeneration, unspecified cervical region: Secondary | ICD-10-CM

## 2016-08-20 DIAGNOSIS — Z79899 Other long term (current) drug therapy: Secondary | ICD-10-CM | POA: Diagnosis not present

## 2016-08-20 MED ORDER — ORPHENADRINE CITRATE ER 100 MG PO TB12: 100.0000 mg | ORAL_TABLET | Freq: Two times a day (BID) | ORAL | 0 refills | Status: DC | PRN

## 2016-08-20 MED ORDER — MELOXICAM 15 MG PO TABS: 15.0000 mg | ORAL_TABLET | Freq: Every day | ORAL | 1 refills | Status: AC

## 2016-08-20 MED ORDER — KETOROLAC TROMETHAMINE 60 MG/2ML IM SOLN
30.0000 mg | Freq: Once | INTRAMUSCULAR | Status: AC
  Administered 2016-08-20: 30 mg via INTRAMUSCULAR
  Filled 2016-08-20: qty 2

## 2016-08-20 NOTE — ED Triage Notes (Signed)
Patient presents to ED via POV with c/o neck pain. Patient denies recent injury. Ambulatory to triage without difficulty.

## 2016-08-20 NOTE — ED Provider Notes (Signed)
[facility] Emergency Department Provider Note ____________________________________________  Time seen: 1433  I have reviewed the triage vital signs and the nursing notes.  HISTORY  Chief Complaint  Neck Pain  HPI Kyle [name] is a 56 y.o. male presents to the ED for presents with 3-4 days of left-sided neck pain or stiffness. He reports he was evaluated at the [facility] 3 weeks prior for the same complaint as well as low back pain. He reports his x-rays at that time confirmed his disc degeneration. He's been evaluated by Dr. Gerald Stabs for pain management, but is currently in transition with to a new pain management clinic.He denies any recent injury, accident, trauma, or injury. He denies any paresthesias at this time. He has been on Mobic most recently.   Past Medical History:  Diagnosis Date  . Allergy   . Asthma   . Chronic back pain 1981   a.followed by pain clinic and neurosurgery.  Marland Kitchen COPD (chronic obstructive pulmonary disease) ([employer])   . Coronary artery disease involving native coronary artery    a. 10/2012 CABG x 4 @ [facility] (LIMA->LAD, VG->RI, VG->OM, VG->PDA);  b. 08/2014 Lexiscan MV: small defect of mild severity present along apical lateral & apex location, no st segment changes, LV function 45-54%, poor gated images lead to poor EF & wall motion assessment, low risk study.  . DDD (degenerative disc disease)   . DM2 (diabetes mellitus, type 2) ([place])   . Fainting spell   . GERD (gastroesophageal reflux disease)   . Hepatitis C    2/2 tattoo  . Hyperlipidemia with target LDL less than 70   . Hypertension   . Polysubstance abuse    a. ongoing daily tobacco abuse and etoh (on the weekends), remote crack/cocaine abuse last used approximately in 2006  . S/P CABG x 4    a. LIMA-LAD, SVG-[place], SVG-OM,SVG-dRCA  . Urine incontinence     Patient Active Problem List   Diagnosis Date Noted  . DDD (degenerative disc disease), lumbar  01/10/2015  . Facet syndrome, lumbar ([place]) 01/10/2015  . Lumbar radiculopathy 01/10/2015  . DDD (degenerative disc disease), cervical 11/22/2014  . Cervical facet syndrome ([place]) 11/22/2014  . Bilateral occipital neuralgia 11/22/2014  . Hospital discharge follow-up 10/16/2014  . Coronary artery disease involving native coronary artery   . S/P CABG x 4   . Chest pain with moderate risk for cardiac etiology 09/27/2014  . Cervicalgia 07/05/2014  . Acute bacterial rhinosinusitis 06/15/2014  . Non compliance w medication regimen 06/07/2014  . Chest tightness 06/07/2014  . Tobacco abuse counseling 01/05/2014  . Vitamin D deficiency 01/05/2014  . Transaminitis 01/05/2014  . Temporary low platelet count ([place]) 01/05/2014  . Diabetes mellitus type 2, uncontrolled ([place]) 01/05/2014  . Back pain 10/01/2013  . Need for prophylactic vaccination with tetanus-diphtheria (Td) 10/01/2013  . Penile lesion 10/01/2013  . Essential hypertension 08/29/2013  . Hyperlipidemia with target LDL less than 70 08/29/2013  . Callus of foot 08/29/2013  . Blurred vision 08/29/2013  . Itching 08/29/2013  . Exacerbation of chronic back pain 06/07/2013  . Hyperlipidemia 03/02/2013  . Former smoker 03/02/2013  . Routine general medical examination at a health care facility 02/24/2013  . Hepatitis C 02/24/2013  . Diabetes type 2, uncontrolled ([place]) 02/24/2013    Past Surgical History:  Procedure Laterality Date  . CARDIAC CATHETERIZATION  11/2012   [facility] - Multivessel CAD  . CORONARY ARTERY BYPASS GRAFT  8/14  CABG x 4; LIMA-LAD, SVG-RI, SVG-OM1, SVG-rPDA (Atretic RIMA).  . neck injection      Prior to Admission medications   Medication Sig Start Date End Date Taking? Authorizing Provider  aspirin EC 81 MG tablet Take 1 tablet (81 mg total) by mouth daily. 05/17/16   Minna Merritts, MD  azithromycin (ZITHROMAX Z-PAK) 250 MG tablet Take 2 tablets (500 mg) on  Day 1,  followed by 1 tablet (250  mg) once daily on Days 2 through 5. 01/18/16   Arlyss Repress, PA-C  chlorpheniramine (CHLOR-TRIMETON) 4 MG tablet Take 1 tablet (4 mg total) by mouth 2 (two) times daily as needed for allergies or rhinitis. 01/18/16   Arlyss Repress, PA-C  chlorpheniramine-HYDROcodone (TUSSIONEX PENNKINETIC ER) 10-8 MG/5ML SUER Take 5 mLs by mouth 2 (two) times daily. 03/22/16   Paulette Blanch, MD  fexofenadine-pseudoephedrine (ALLEGRA-D) 60-120 MG 12 hr tablet Take 1 tablet by mouth 2 (two) times daily. 06/13/15   Sable Feil, PA-C  guaiFENesin-codeine 100-10 MG/5ML syrup Take 10 mLs by mouth every 4 (four) hours as needed for cough. 01/18/16   Pierce Crane Beers, PA-C  insulin NPH-regular Human (NOVOLIN 70/30) (70-30) 100 UNIT/ML injection 32 units in the AM and 22 units with evening meal 05/04/13   Raquel Dagoberto Ligas, NP  isosorbide mononitrate (IMDUR) 30 MG 24 hr tablet Take 1 tablet (30 mg total) by mouth daily. 05/17/16   Minna Merritts, MD  magic mouthwash SOLN Take 10 mLs by mouth 3 (three) times daily as needed for mouth pain. 03/22/16   Paulette Blanch, MD  meloxicam (MOBIC) 15 MG tablet Take 1 tablet (15 mg total) by mouth daily. 08/20/16 08/20/17  Carlinda Ohlson V Bacon Richar Dunklee, PA-C  metFORMIN (GLUCOPHAGE) 1000 MG tablet Take 1 tablet (1,000 mg total) by mouth 2 (two) times daily with a meal. 09/13/13   Raquel Dagoberto Ligas, NP  metoprolol tartrate (LOPRESSOR) 25 MG tablet Take 1.5 tablets (37.5 mg total) by mouth 2 (two) times daily. 05/17/16   Minna Merritts, MD  naproxen (NAPROSYN) 375 MG tablet Take 1 tablet (375 mg total) by mouth 2 (two) times daily with a meal. 11/21/15 11/20/16  Johnn Hai, PA-C  nitroGLYCERIN (NITROSTAT) 0.4 MG SL tablet Place 1 tablet (0.4 mg total) under the tongue every 5 (five) minutes as needed for chest pain. 09/29/14   Aldean Jewett, MD  orphenadrine (NORFLEX) 100 MG tablet Take 1 tablet (100 mg total) by mouth 2 (two) times daily as needed for muscle spasms. 08/20/16   Dannielle Karvonen Esabella Stockinger, PA-C   oxyCODONE (OXY IR/ROXICODONE) 5 MG immediate release tablet Limit one half to one tab by mouth per day or twice per day if tolerated 12/18/15   Mohammed Kindle, MD  pregabalin (LYRICA) 25 MG capsule Limit 1 tab by mouth per day or twice per day if tolerated  NO GABAPENTIN (NEURONTIN) 12/18/15   Mohammed Kindle, MD  sildenafil (VIAGRA) 100 MG tablet Take 100 mg by mouth daily as needed for erectile dysfunction.    Historical Provider, MD  simvastatin (ZOCOR) 40 MG tablet Take 1 tablet (40 mg total) by mouth daily at 6 PM. 05/17/16   Minna Merritts, MD    Allergies Amoxicillin; Flexeril [cyclobenzaprine]; Methadone; Tramadol; and Trazodone and nefazodone  Family History  Problem Relation Age of Onset  . Heart disease Mother   . Heart disease Father   . Hypertension Father   . Diabetes Sister   . Cancer Sister  skin cancer  . Diabetes Other   . Heart disease Other     Social History Social History  Substance Use Topics  . Smoking status: Current Every Day Smoker    Packs/day: 0.25    Years: 35.00    Types: Cigarettes    Last attempt to quit: 11/24/2012  . Smokeless tobacco: Never Used     Comment: a pack lasts 2 to 4 days   . Alcohol use 0.0 oz/week     Comment: 12pk/week    Review of Systems  Constitutional: Negative for fever. Cardiovascular: Negative for chest pain. Respiratory: Negative for shortness of breath. Musculoskeletal: Negative for back pain. Reports neck pain as above Skin: Negative for rash. Neurological: Negative for headaches, focal weakness or numbness. ____________________________________________  PHYSICAL EXAM:  VITAL SIGNS: ED Triage Vitals [08/20/16 1305]  Enc Vitals Group     BP (!) 155/104     Pulse Rate 99     Resp 16     Temp 98.1 F (36.7 C)     Temp src      SpO2 99 %     Weight 167 lb (75.8 kg)     Height 5\' 9"  (1.753 m)     Head Circumference      Peak Flow      Pain Score 7     Pain Loc      Pain Edu?      Excl. in [place]?      Constitutional: Alert and oriented. Well appearing and in no distress. Head: Normocephalic and atraumatic. Neck: Supple. No thyromegaly. Hematological/Lymphatic/Immunological: No cervical lymphadenopathy. Cardiovascular: Normal rate, regular rhythm. Normal distal pulses. Respiratory: Normal respiratory effort. No wheezes/rales/rhonchi. Musculoskeletal: spinal alignment without midline tenderness, spasm, deformity, step-off. Patient is tender to palpation to the left cervical musculature and upper trapezius region. He is slightly decreased range of motion with left lateral bending and left rotation.Nontender with normal range of motion in all extremities.  Neurologic: nerves II through XII grossly intact. Normal UE DTRs bilaterally. Normal intrinsic and opposition testing. Normal grip strength bilaterally. Normal speech and language. No gross focal neurologic deficits are appreciated. Skin:  Skin is warm, dry and intact. No rash noted. ___________________________________________  PROCEDURES  Toradol 30 mg IM ____________________________________________  INITIAL IMPRESSION / ASSESSMENT AND PLAN / ED COURSE  Patient with chronic neck & back pain underlying degenerative disc disease. No new at this time. He may superimposed acute strain on his underlying DDD. He'll be discharged at this time with a prescription for his meloxicam and Flexeril to dose as directed. He is referred to Dr. Brynda Greathouse for ongoing pain  management with [facility]. He apparently is scheduled to start physical therapy at the end of the month. ____________________________________________  FINAL CLINICAL IMPRESSION(S) / ED DIAGNOSES  Final diagnoses:  Neck pain  DDD (degenerative disc disease), cervical      Melvenia Needles, PA-C 08/20/16 1804    Lisa Roca, MD 08/24/16 1254

## 2016-08-20 NOTE — Discharge Instructions (Signed)
Your exam is consistent with your underlying cervical spine degenerative disc disease. You may have some increased muscle spasm at this time. Take the prescription meds as directed, and follow-up with Dr. Brynda Greathouse for ongoing management. See the [facility] for routine care and physical therapy as planned.

## 2016-08-20 NOTE — ED Notes (Signed)
Pt to ed with c/o neck pain x 3 days.  Pt states hx of same, but states worse over the last few days denies injury, Good ROM.

## 2016-09-26 ENCOUNTER — Emergency Department
Admission: EM | Admit: 2016-09-26 | Discharge: 2016-09-26 | Disposition: A | Discharge status: Home / Self Care | Payer: Medicare HMO | Attending: Emergency Medicine | Admitting: Emergency Medicine

## 2016-09-26 ENCOUNTER — Emergency Department: Payer: Medicare HMO

## 2016-09-26 DIAGNOSIS — J449 Chronic obstructive pulmonary disease, unspecified: Secondary | ICD-10-CM | POA: Insufficient documentation

## 2016-09-26 DIAGNOSIS — F17210 Nicotine dependence, cigarettes, uncomplicated: Secondary | ICD-10-CM | POA: Diagnosis not present

## 2016-09-26 DIAGNOSIS — Z7982 Long term (current) use of aspirin: Secondary | ICD-10-CM | POA: Insufficient documentation

## 2016-09-26 DIAGNOSIS — Z794 Long term (current) use of insulin: Secondary | ICD-10-CM | POA: Insufficient documentation

## 2016-09-26 DIAGNOSIS — J189 Pneumonia, unspecified organism: Secondary | ICD-10-CM

## 2016-09-26 DIAGNOSIS — J45909 Unspecified asthma, uncomplicated: Secondary | ICD-10-CM | POA: Diagnosis not present

## 2016-09-26 DIAGNOSIS — J181 Lobar pneumonia, unspecified organism: Secondary | ICD-10-CM | POA: Diagnosis not present

## 2016-09-26 DIAGNOSIS — E119 Type 2 diabetes mellitus without complications: Secondary | ICD-10-CM | POA: Insufficient documentation

## 2016-09-26 DIAGNOSIS — R05 Cough: Secondary | ICD-10-CM | POA: Diagnosis present

## 2016-09-26 MED ORDER — IPRATROPIUM-ALBUTEROL 0.5-2.5 (3) MG/3ML IN SOLN
3.0000 mL | Freq: Once | RESPIRATORY_TRACT | Status: AC
Start: 2016-09-26 — End: 2016-09-26
  Administered 2016-09-26: 3 mL via RESPIRATORY_TRACT
  Filled 2016-09-26: qty 3

## 2016-09-26 MED ORDER — PREDNISONE 10 MG (21) PO TBPK: ORAL_TABLET | ORAL | 0 refills | Status: DC

## 2016-09-26 MED ORDER — AZITHROMYCIN 250 MG PO TABS: ORAL_TABLET | ORAL | 0 refills | Status: DC

## 2016-09-26 NOTE — ED Triage Notes (Signed)
Pt states that he has had a cough for approximately 2 weeks, states that it has gotten worse.  Pt states that his asthma medication is not helping at this time.  Pt is A&Ox4 and appears in NAD.  No coughing noted during assessment.

## 2016-09-26 NOTE — ED Provider Notes (Signed)
[facility] Emergency Department Provider Note  ____________________________________________  Time seen: Approximately 5:50 PM  I have reviewed the triage vital signs and the nursing notes.   HISTORY  Chief Complaint Cough    HPI SHONDALE QUINLEY is a 56 y.o. male that presents to the emergency department with cough for 2 weeks. Cough is productive with yellow sputum. Patient smokes 5 cigarettes per day. He has an history of asthma but has not used his inhalers today. He denies fever, headache, nasal congestion, chest pain, nausea, vomiting, abdominal pain.   Past Medical History:  Diagnosis Date  . Allergy   . Asthma   . Chronic back pain 1981   a.followed by pain clinic and neurosurgery.  Marland Kitchen COPD (chronic obstructive pulmonary disease) ([place])   . Coronary artery disease involving native coronary artery    a. 10/2012 CABG x 4 @ [facility] (LIMA->LAD, VG->RI, VG->OM, VG->PDA);  b. 08/2014 Lexiscan MV: small defect of mild severity present along apical lateral & apex location, no st segment changes, LV function 45-54%, poor gated images lead to poor EF & wall motion assessment, low risk study.  . DDD (degenerative disc disease)   . DM2 (diabetes mellitus, type 2) ([place])   . Fainting spell   . GERD (gastroesophageal reflux disease)   . Hepatitis C    2/2 tattoo  . Hyperlipidemia with target LDL less than 70   . Hypertension   . Polysubstance abuse    a. ongoing daily tobacco abuse and etoh (on the weekends), remote crack/cocaine abuse last used approximately in 2006  . S/P CABG x 4    a. LIMA-LAD, SVG-[place], SVG-OM,SVG-dRCA  . Urine incontinence     Patient Active Problem List   Diagnosis Date Noted  . DDD (degenerative disc disease), lumbar 01/10/2015  . Facet syndrome, lumbar ([place]) 01/10/2015  . Lumbar radiculopathy 01/10/2015  . DDD (degenerative disc disease), cervical 11/22/2014  . Cervical facet syndrome ([place]) 11/22/2014  . Bilateral  occipital neuralgia 11/22/2014  . Hospital discharge follow-up 10/16/2014  . Coronary artery disease involving native coronary artery   . S/P CABG x 4   . Chest pain with moderate risk for cardiac etiology 09/27/2014  . Cervicalgia 07/05/2014  . Acute bacterial rhinosinusitis 06/15/2014  . Non compliance w medication regimen 06/07/2014  . Chest tightness 06/07/2014  . Tobacco abuse counseling 01/05/2014  . Vitamin D deficiency 01/05/2014  . Transaminitis 01/05/2014  . Temporary low platelet count ([place]) 01/05/2014  . Diabetes mellitus type 2, uncontrolled ([place]) 01/05/2014  . Back pain 10/01/2013  . Need for prophylactic vaccination with tetanus-diphtheria (Td) 10/01/2013  . Penile lesion 10/01/2013  . Essential hypertension 08/29/2013  . Hyperlipidemia with target LDL less than 70 08/29/2013  . Callus of foot 08/29/2013  . Blurred vision 08/29/2013  . Itching 08/29/2013  . Exacerbation of chronic back pain 06/07/2013  . Hyperlipidemia 03/02/2013  . Former smoker 03/02/2013  . Routine general medical examination at a health care facility 02/24/2013  . Hepatitis C 02/24/2013  . Diabetes type 2, uncontrolled ([place]) 02/24/2013    Past Surgical History:  Procedure Laterality Date  . CARDIAC CATHETERIZATION  11/2012   [facility] - Multivessel CAD  . CORONARY ARTERY BYPASS GRAFT  8/14   CABG x 4; LIMA-LAD, SVG-RI, SVG-OM1, SVG-rPDA (Atretic RIMA).  . neck injection      Prior to Admission medications   Medication Sig Start Date End Date Taking? Authorizing Provider  aspirin EC 81 MG tablet Take  1 tablet (81 mg total) by mouth daily. 05/17/16   Minna Merritts, MD  azithromycin (ZITHROMAX Z-PAK) 250 MG tablet Take 2 tablets (500 mg) on  Day 1,  followed by 1 tablet (250 mg) once daily on Days 2 through 5. 09/26/16   Laban Emperor, PA-C  chlorpheniramine (CHLOR-TRIMETON) 4 MG tablet Take 1 tablet (4 mg total) by mouth 2 (two) times daily as needed for allergies or rhinitis.  01/18/16   Beers, Pierce Crane, PA-C  chlorpheniramine-HYDROcodone (TUSSIONEX PENNKINETIC ER) 10-8 MG/5ML SUER Take 5 mLs by mouth 2 (two) times daily. 03/22/16   Paulette Blanch, MD  fexofenadine-pseudoephedrine (ALLEGRA-D) 60-120 MG 12 hr tablet Take 1 tablet by mouth 2 (two) times daily. 06/13/15   Sable Feil, PA-C  guaiFENesin-codeine 100-10 MG/5ML syrup Take 10 mLs by mouth every 4 (four) hours as needed for cough. 01/18/16   Beers, Pierce Crane, PA-C  insulin NPH-regular Human (NOVOLIN 70/30) (70-30) 100 UNIT/ML injection 32 units in the AM and 22 units with evening meal 05/04/13   Rey, Raquel M, NP  isosorbide mononitrate (IMDUR) 30 MG 24 hr tablet Take 1 tablet (30 mg total) by mouth daily. 05/17/16   Minna Merritts, MD  magic mouthwash SOLN Take 10 mLs by mouth 3 (three) times daily as needed for mouth pain. 03/22/16   Paulette Blanch, MD  meloxicam (MOBIC) 15 MG tablet Take 1 tablet (15 mg total) by mouth daily. 08/20/16 08/20/17  Menshew, Dannielle Karvonen, PA-C  metFORMIN (GLUCOPHAGE) 1000 MG tablet Take 1 tablet (1,000 mg total) by mouth 2 (two) times daily with a meal. 09/13/13   Rey, Latina Craver, NP  metoprolol tartrate (LOPRESSOR) 25 MG tablet Take 1.5 tablets (37.5 mg total) by mouth 2 (two) times daily. 05/17/16   Minna Merritts, MD  naproxen (NAPROSYN) 375 MG tablet Take 1 tablet (375 mg total) by mouth 2 (two) times daily with a meal. 11/21/15 11/20/16  Johnn Hai, PA-C  nitroGLYCERIN (NITROSTAT) 0.4 MG SL tablet Place 1 tablet (0.4 mg total) under the tongue every 5 (five) minutes as needed for chest pain. 09/29/14   Aldean Jewett, MD  orphenadrine (NORFLEX) 100 MG tablet Take 1 tablet (100 mg total) by mouth 2 (two) times daily as needed for muscle spasms. 08/20/16   Menshew, Dannielle Karvonen, PA-C  oxyCODONE (OXY IR/ROXICODONE) 5 MG immediate release tablet Limit one half to one tab by mouth per day or twice per day if tolerated 12/18/15   Mohammed Kindle, MD  predniSONE (STERAPRED UNI-PAK 21  TAB) 10 MG (21) TBPK tablet Take 6 tablets on day 1, take 5 tablets on day 2, take 4 tablets on day 3, take 3 tablets on day 4, take 2 tablets on day 5, take 1 tablet on day 6 09/26/16   Laban Emperor, PA-C  pregabalin (LYRICA) 25 MG capsule Limit 1 tab by mouth per day or twice per day if tolerated  NO GABAPENTIN (NEURONTIN) 12/18/15   Mohammed Kindle, MD  sildenafil (VIAGRA) 100 MG tablet Take 100 mg by mouth daily as needed for erectile dysfunction.    [provider]  simvastatin (ZOCOR) 40 MG tablet Take 1 tablet (40 mg total) by mouth daily at 6 PM. 05/17/16   Gollan, Kathlene November, MD    Allergies Amoxicillin; Flexeril [cyclobenzaprine]; Methadone; Tramadol; and Trazodone and nefazodone  Family History  Problem Relation Age of Onset  . Heart disease Mother   . Heart disease Father   .  Hypertension Father   . Diabetes Sister   . Cancer Sister        skin cancer  . Diabetes Other   . Heart disease Other     Social History Social History  Substance Use Topics  . Smoking status: Current Every Day Smoker    Packs/day: 0.25    Years: 35.00    Types: Cigarettes  . Smokeless tobacco: Never Used     Comment: a pack lasts 2 to 4 days   . Alcohol use 0.0 oz/week     Comment: 2-3 beers/week     Review of Systems  Constitutional: No fever/chills Eyes: No visual changes. No discharge. ENT: Negative for congestion and rhinorrhea. Cardiovascular: No chest pain. Respiratory: Positive for cough. Gastrointestinal: No abdominal pain.  No nausea, no vomiting.  No diarrhea.  No constipation. Musculoskeletal: Negative for musculoskeletal pain. Skin: Negative for rash, abrasions, lacerations, ecchymosis. Neurological: Negative for headaches.   ____________________________________________   PHYSICAL EXAM:  VITAL SIGNS: ED Triage Vitals [09/26/16 1648]  Enc Vitals Group     BP (!) 138/93     Pulse Rate 98     Resp 20     Temp 98.6 F (37 C)     Temp Source Oral     SpO2 95  %     Weight 168 lb (76.2 kg)     Height 5\' 7"  (1.702 m)     Head Circumference      Peak Flow      Pain Score      Pain Loc      Pain Edu?      Excl. in [place]?      Constitutional: Alert and oriented. Well appearing and in no acute distress. Eyes: Conjunctivae are normal. PERRL. EOMI. No discharge. Head: Atraumatic. ENT: No frontal and maxillary sinus tenderness.      Ears: Tympanic membranes pearly gray with good landmarks. No discharge.      Nose: No congestion/rhinnorhea.      Mouth/Throat: Mucous membranes are moist. Oropharynx non-erythematous.  Neck: No stridor.   Hematological/Lymphatic/Immunilogical: No cervical lymphadenopathy. Cardiovascular: Normal rate, regular rhythm.  Good peripheral circulation. Respiratory: Normal respiratory effort without tachypnea or retractions. Lungs CTAB. Good air entry to the bases with no decreased or absent breath sounds. Gastrointestinal: Bowel sounds 4 quadrants. Soft and nontender to palpation. No guarding or rigidity. No palpable masses. No distention. Musculoskeletal: Full range of motion to all extremities. No gross deformities appreciated. Neurologic:  Normal speech and language. No gross focal neurologic deficits are appreciated.  Skin:  Skin is warm, dry and intact. No rash noted.  ____________________________________________   LABS (all labs ordered are listed, but only abnormal results are displayed)  Labs Reviewed - No data to display ____________________________________________  EKG   ____________________________________________  RADIOLOGY Robinette Haines, personally viewed and evaluated these images (plain radiographs) as part of my medical decision making, as well as reviewing the written report by the radiologist.  Dg Chest 2 View  Result Date: 09/26/2016 CLINICAL DATA:  Cough for approximately 2 weeks. EXAM: CHEST  2 VIEW COMPARISON:  03/22/2016 FINDINGS: Right lung clear. Subtle atelectasis or pneumonia noted  left lung base. The cardiopericardial silhouette is within normal limits for size. Patient is status post CABG The visualized bony structures of the thorax are intact. IMPRESSION: Subtle atelectasis or pneumonia at the left lung base. Electronically Signed   By: Misty Stanley M.D.   On: 09/26/2016 17:18    ____________________________________________  PROCEDURES  Procedure(s) performed:    Procedures    Medications  ipratropium-albuterol (DUONEB) 0.5-2.5 (3) MG/3ML nebulizer solution 3 mL (3 mLs Nebulization Given 09/26/16 1746)     ____________________________________________   INITIAL IMPRESSION / ASSESSMENT AND PLAN / ED COURSE  Pertinent labs & imaging results that were available during my care of the patient were reviewed by me and considered in my medical decision making (see chart for details).  Review of the [place] CSRS was performed in accordance of the [place] prior to dispensing any controlled drugs.  Patient's diagnosis is consistent with pneumonia. Vital signs and exam are reassuring. Xray indicates atelectasis or pneumonia. He was given duoneb in ED. Patient appears well and is staying well hydrated. Patient feels comfortable going home. Patient will be discharged home with prescriptions for azithromycin and prednisone. Patient is to follow up with PCP as needed or otherwise directed. Patient is given ED precautions to return to the ED for any worsening or new symptoms.     ____________________________________________  FINAL CLINICAL IMPRESSION(S) / ED DIAGNOSES  Final diagnoses:  Community acquired pneumonia of left lower lobe of lung ([place])      NEW MEDICATIONS STARTED DURING THIS VISIT:  Discharge Medication List as of 09/26/2016  5:52 PM    START taking these medications   Details  predniSONE (STERAPRED UNI-PAK 21 TAB) 10 MG (21) TBPK tablet Take 6 tablets on day 1, take 5 tablets on day 2, take 4 tablets on day 3, take 3 tablets on day 4, take 2 tablets  on day 5, take 1 tablet on day 6, Print            This chart was dictated using voice recognition software/Dragon. Despite best efforts to proofread, errors can occur which can change the meaning. Any change was purely unintentional.    Laban Emperor, PA-C 09/26/16 1906    Carrie Mew, MD 09/28/16 1155

## 2016-09-26 NOTE — ED Notes (Signed)
Pt discharged to home.  Family member driving.  Discharge instructions reviewed.  Verbalized understanding.  No questions or concerns at this time.  Teach back verified.  Pt in NAD.  No items left in ED.   

## 2016-10-25 ENCOUNTER — Emergency Department: Payer: Medicare HMO

## 2016-10-25 ENCOUNTER — Encounter: Payer: Self-pay | Admitting: Emergency Medicine

## 2016-10-25 ENCOUNTER — Emergency Department
Admission: EM | Admit: 2016-10-25 | Discharge: 2016-10-25 | Disposition: A | Discharge status: Home / Self Care | Payer: Medicare HMO | Attending: Emergency Medicine | Admitting: Emergency Medicine

## 2016-10-25 DIAGNOSIS — Z794 Long term (current) use of insulin: Secondary | ICD-10-CM | POA: Insufficient documentation

## 2016-10-25 DIAGNOSIS — K2980 Duodenitis without bleeding: Secondary | ICD-10-CM | POA: Insufficient documentation

## 2016-10-25 DIAGNOSIS — Z951 Presence of aortocoronary bypass graft: Secondary | ICD-10-CM | POA: Diagnosis not present

## 2016-10-25 DIAGNOSIS — R197 Diarrhea, unspecified: Secondary | ICD-10-CM | POA: Insufficient documentation

## 2016-10-25 DIAGNOSIS — Z7982 Long term (current) use of aspirin: Secondary | ICD-10-CM | POA: Diagnosis not present

## 2016-10-25 DIAGNOSIS — Z79899 Other long term (current) drug therapy: Secondary | ICD-10-CM | POA: Diagnosis not present

## 2016-10-25 DIAGNOSIS — I10 Essential (primary) hypertension: Secondary | ICD-10-CM | POA: Diagnosis not present

## 2016-10-25 DIAGNOSIS — F17210 Nicotine dependence, cigarettes, uncomplicated: Secondary | ICD-10-CM | POA: Diagnosis not present

## 2016-10-25 DIAGNOSIS — J45909 Unspecified asthma, uncomplicated: Secondary | ICD-10-CM | POA: Diagnosis not present

## 2016-10-25 DIAGNOSIS — R1084 Generalized abdominal pain: Secondary | ICD-10-CM | POA: Diagnosis present

## 2016-10-25 DIAGNOSIS — I2510 Atherosclerotic heart disease of native coronary artery without angina pectoris: Secondary | ICD-10-CM | POA: Insufficient documentation

## 2016-10-25 DIAGNOSIS — E1165 Type 2 diabetes mellitus with hyperglycemia: Secondary | ICD-10-CM

## 2016-10-25 DIAGNOSIS — E119 Type 2 diabetes mellitus without complications: Secondary | ICD-10-CM | POA: Insufficient documentation

## 2016-10-25 LAB — URINALYSIS, COMPLETE (UACMP) WITH MICROSCOPIC
Bacteria, UA: NONE SEEN
Bilirubin Urine: NEGATIVE
Glucose, UA: >=500 mg/dL — AB
Hgb urine dipstick: NEGATIVE
KETONES UR: NEGATIVE mg/dL
LEUKOCYTES UA: NEGATIVE
Nitrite: NEGATIVE
PROTEIN: NEGATIVE mg/dL
RBC / HPF: NONE SEEN RBC/hpf (ref 0–5)
SQUAMOUS EPITHELIAL / LPF: NONE SEEN
Specific Gravity, Urine: 1.039 — ABNORMAL HIGH (ref 1.005–1.030)
WBC, UA: NONE SEEN WBC/hpf (ref 0–5)
pH: 5 (ref 5.0–8.0)

## 2016-10-25 LAB — CBC WITH DIFFERENTIAL/PLATELET
BASOS ABS: 0.1 10*3/uL (ref 0–0.1)
BASOS PCT: 1 %
Eosinophils Absolute: 0.4 10*3/uL (ref 0–0.7)
Eosinophils Relative: 4 %
HEMATOCRIT: 46.5 % (ref 40.0–52.0)
Hemoglobin: 15.3 g/dL (ref 13.0–18.0)
Lymphocytes Relative: 45 %
Lymphs Abs: 4 10*3/uL — ABNORMAL HIGH (ref 1.0–3.6)
MCH: 27.8 pg (ref 26.0–34.0)
MCHC: 32.9 g/dL (ref 32.0–36.0)
MCV: 84.4 fL (ref 80.0–100.0)
MONO ABS: 0.8 10*3/uL (ref 0.2–1.0)
Monocytes Relative: 10 %
NEUTROS ABS: 3.6 10*3/uL (ref 1.4–6.5)
NEUTROS PCT: 40 %
Platelets: 113 10*3/uL — ABNORMAL LOW (ref 150–440)
RBC: 5.5 MIL/uL (ref 4.40–5.90)
RDW: 13.4 % (ref 11.5–14.5)
WBC: 8.9 10*3/uL (ref 3.8–10.6)

## 2016-10-25 LAB — TROPONIN I

## 2016-10-25 LAB — COMPREHENSIVE METABOLIC PANEL
ALT: 94 U/L — AB (ref 17–63)
AST: 50 U/L — AB (ref 15–41)
Albumin: 4 g/dL (ref 3.5–5.0)
Alkaline Phosphatase: 82 U/L (ref 38–126)
Anion gap: 9 (ref 5–15)
BILIRUBIN TOTAL: 0.8 mg/dL (ref 0.3–1.2)
BUN: 12 mg/dL (ref 6–20)
CHLORIDE: 98 mmol/L — AB (ref 101–111)
CO2: 23 mmol/L (ref 22–32)
Calcium: 9.4 mg/dL (ref 8.9–10.3)
Creatinine, Ser: 0.69 mg/dL (ref 0.61–1.24)
GFR calc Af Amer: >60 mL/min (ref >60)
GFR calc non Af Amer: >60 mL/min (ref >60)
GLUCOSE: 525 mg/dL — AB (ref 65–99)
POTASSIUM: 4.2 mmol/L (ref 3.5–5.1)
Sodium: 130 mmol/L — ABNORMAL LOW (ref 135–145)
Total Protein: 7.4 g/dL (ref 6.5–8.1)

## 2016-10-25 LAB — GLUCOSE, CAPILLARY: GLUCOSE-CAPILLARY: 245 mg/dL — AB (ref 65–99)

## 2016-10-25 LAB — LIPASE, BLOOD: Lipase: 44 U/L (ref 11–51)

## 2016-10-25 MED ORDER — IOPAMIDOL (ISOVUE-300) INJECTION 61%
100.0000 mL | Freq: Once | INTRAVENOUS | Status: AC | PRN
  Administered 2016-10-25: 100 mL via INTRAVENOUS

## 2016-10-25 MED ORDER — FAMOTIDINE 20 MG PO TABS: 20.0000 mg | ORAL_TABLET | Freq: Two times a day (BID) | ORAL | 1 refills | Status: DC

## 2016-10-25 MED ORDER — MORPHINE SULFATE (PF) 4 MG/ML IV SOLN
4.0000 mg | Freq: Once | INTRAVENOUS | Status: AC
  Administered 2016-10-25: 4 mg via INTRAVENOUS
  Filled 2016-10-25: qty 1

## 2016-10-25 MED ORDER — SODIUM CHLORIDE 0.9 % IV BOLUS (SEPSIS)
500.0000 mL | Freq: Once | INTRAVENOUS | Status: AC
  Administered 2016-10-25: 500 mL via INTRAVENOUS

## 2016-10-25 MED ORDER — SODIUM CHLORIDE 0.9 % IV BOLUS (SEPSIS)
1000.0000 mL | Freq: Once | INTRAVENOUS | Status: AC
  Administered 2016-10-25: 1000 mL via INTRAVENOUS

## 2016-10-25 MED ORDER — METFORMIN HCL 500 MG PO TABS: 1000.0000 mg | ORAL_TABLET | Freq: Two times a day (BID) | ORAL | 1 refills | Status: DC

## 2016-10-25 MED ORDER — ONDANSETRON HCL 4 MG/2ML IJ SOLN
4.0000 mg | Freq: Once | INTRAMUSCULAR | Status: AC
  Administered 2016-10-25: 4 mg via INTRAVENOUS
  Filled 2016-10-25: qty 2

## 2016-10-25 MED ORDER — INSULIN ASPART 100 UNIT/ML ~~LOC~~ SOLN
10.0000 [IU] | Freq: Once | SUBCUTANEOUS | Status: AC
  Administered 2016-10-25: 10 [IU] via INTRAVENOUS
  Filled 2016-10-25: qty 1

## 2016-10-25 NOTE — Discharge Instructions (Signed)
Stop the long acting metformin and switch to the short acting. Take it twice a day 1000mg . Take pepcid daily. If your symptoms are not improved by the end of the week you need to see a GI specialist. We are giving you the phone number above. Return to the emergency room if you have new or worsening abdominal pain, I you are concerned about dehydration, if you have a fever, or any new symptoms that are not present today.

## 2016-10-25 NOTE — ED Notes (Signed)
Date and time results received: 10/25/16 0942 (use smartphrase ".now" to insert current time)  Test: troponin  Critical Value: 525  Name of Provider Notified: veronese

## 2016-10-25 NOTE — ED Notes (Signed)
Patient transported to CT 

## 2016-10-25 NOTE — ED Triage Notes (Signed)
Pt to ED via POV c/o bilateral lower abdominal pain. Pt states that he had pneumonia about 2-3 weeks ago he had pneumonia, about 2 weeks ago he started having bilateral lower abdominal pain, pt states that it is a burning/cramping pain. Pt denies N/V/D or fevers. Pt does not appear to be in any distress at this time.

## 2016-10-25 NOTE — ED Notes (Signed)
Pt has urinal, aware of need for urine

## 2016-10-25 NOTE — ED Provider Notes (Signed)
[facility] Emergency Department Provider Note  ____________________________________________  Time seen: Approximately 9:07 AM  I have reviewed the triage vital signs and the nursing notes.   HISTORY  Chief Complaint Abdominal Pain   HPI Kyle [name] is a 56 y.o. male with a history of CAD status post CABG, diabetes, hepatitis C, hypertension, hyperlipidemia who presents for evaluation of abdominal pain. Patient reports one week of abdominal pain. The pain initially was on the right side and is now present diffusely in his abdomen. He describes it as a burning sensation that is constant and nonradiating. He reports loose stools for the last few days. He denies nausea, vomiting, constipation, dysuria, hematuria, chest pain or shortness of breath, fever or chills. Patient denies NSAID use. Last alcohol intake was 3 days ago. He denies ever having similar pain. No prior abdominal surgeries.  Past Medical History:  Diagnosis Date  . Allergy   . Asthma   . Chronic back pain 1981   a.followed by pain clinic and neurosurgery.  Marland Kitchen COPD (chronic obstructive pulmonary disease) ([place])   . Coronary artery disease involving native coronary artery    a. 10/2012 CABG x 4 @ [facility] (LIMA->LAD, VG->RI, VG->OM, VG->PDA);  b. 08/2014 Lexiscan MV: small defect of mild severity present along apical lateral & apex location, no st segment changes, LV function 45-54%, poor gated images lead to poor EF & wall motion assessment, low risk study.  . DDD (degenerative disc disease)   . DM2 (diabetes mellitus, type 2) ([place])   . Fainting spell   . GERD (gastroesophageal reflux disease)   . Hepatitis C    2/2 tattoo  . Hyperlipidemia with target LDL less than 70   . Hypertension   . Polysubstance abuse    a. ongoing daily tobacco abuse and etoh (on the weekends), remote crack/cocaine abuse last used approximately in 2006  . S/P CABG x 4    a. LIMA-LAD, SVG-Ramus, SVG-OM,SVG-dRCA   . Urine incontinence     Patient Active Problem List   Diagnosis Date Noted  . DDD (degenerative disc disease), lumbar 01/10/2015  . Facet syndrome, lumbar ([place]) 01/10/2015  . Lumbar radiculopathy 01/10/2015  . DDD (degenerative disc disease), cervical 11/22/2014  . Cervical facet syndrome ([place]) 11/22/2014  . Bilateral occipital neuralgia 11/22/2014  . Hospital discharge follow-up 10/16/2014  . Coronary artery disease involving native coronary artery   . S/P CABG x 4   . Chest pain with moderate risk for cardiac etiology 09/27/2014  . Cervicalgia 07/05/2014  . Acute bacterial rhinosinusitis 06/15/2014  . Non compliance w medication regimen 06/07/2014  . Chest tightness 06/07/2014  . Tobacco abuse counseling 01/05/2014  . Vitamin D deficiency 01/05/2014  . Transaminitis 01/05/2014  . Temporary low platelet count ([place]) 01/05/2014  . Diabetes mellitus type 2, uncontrolled ([place]) 01/05/2014  . Back pain 10/01/2013  . Need for prophylactic vaccination with tetanus-diphtheria (Td) 10/01/2013  . Penile lesion 10/01/2013  . Essential hypertension 08/29/2013  . Hyperlipidemia with target LDL less than 70 08/29/2013  . Callus of foot 08/29/2013  . Blurred vision 08/29/2013  . Itching 08/29/2013  . Exacerbation of chronic back pain 06/07/2013  . Hyperlipidemia 03/02/2013  . Former smoker 03/02/2013  . Routine general medical examination at a health care facility 02/24/2013  . Hepatitis C 02/24/2013  . Diabetes type 2, uncontrolled ([place]) 02/24/2013    Past Surgical History:  Procedure Laterality Date  . CARDIAC CATHETERIZATION  11/2012   [facility] -  Multivessel CAD  . CORONARY ARTERY BYPASS GRAFT  8/14   CABG x 4; LIMA-LAD, SVG-RI, SVG-OM1, SVG-rPDA (Atretic RIMA).  . neck injection      Prior to Admission medications   Medication Sig Start Date End Date Taking? Authorizing Provider  aspirin EC 81 MG tablet Take 1 tablet (81 mg total) by mouth daily. 05/17/16  Yes  Gollan, Kathlene November, MD  insulin glargine (LANTUS) 100 UNIT/ML injection Inject 30 Units into the skin 2 (two) times daily.   Yes [provider]  isosorbide mononitrate (IMDUR) 30 MG 24 hr tablet Take 1 tablet (30 mg total) by mouth daily. 05/17/16  Yes Gollan, Kathlene November, MD  meloxicam (MOBIC) 15 MG tablet Take 1 tablet (15 mg total) by mouth daily. Patient taking differently: Take 15 mg by mouth daily as needed for pain.  08/20/16 08/20/17 Yes Menshew, Dannielle Karvonen, PA-C  metoprolol succinate (TOPROL-XL) 25 MG 24 hr tablet Take 50 mg by mouth daily.   Yes [provider]  nitroGLYCERIN (NITROSTAT) 0.4 MG SL tablet Place 1 tablet (0.4 mg total) under the tongue every 5 (five) minutes as needed for chest pain. 09/29/14  Yes Aldean Jewett, MD  rosuvastatin (CRESTOR) 10 MG tablet Take 10 mg by mouth at bedtime.   Yes [provider]  famotidine (PEPCID) 20 MG tablet Take 1 tablet (20 mg total) by mouth 2 (two) times daily. 10/25/16 10/25/17  Rudene Re, MD  metFORMIN (GLUCOPHAGE) 500 MG tablet Take 2 tablets (1,000 mg total) by mouth 2 (two) times daily with a meal. 10/25/16 10/25/17  Rudene Re, MD    Allergies Amoxicillin; Flexeril [cyclobenzaprine]; Methadone; Tramadol; and Trazodone and nefazodone  Family History  Problem Relation Age of Onset  . Heart disease Mother   . Heart disease Father   . Hypertension Father   . Diabetes Sister   . Cancer Sister        skin cancer  . Diabetes Other   . Heart disease Other     Social History Social History  Substance Use Topics  . Smoking status: Current Every Day Smoker    Packs/day: 0.25    Years: 35.00    Types: Cigarettes  . Smokeless tobacco: Never Used     Comment: a pack lasts 2 to 4 days   . Alcohol use 0.0 oz/week     Comment: 2-3 beers/week    Review of Systems  Constitutional: Negative for fever. Eyes: Negative for visual changes. ENT: Negative for sore throat. Neck: No neck pain    Cardiovascular: Negative for chest pain. Respiratory: Negative for shortness of breath. Gastrointestinal: + diffuse abdominal pain, loose stools. No vomiting or diarrhea. Genitourinary: Negative for dysuria. Musculoskeletal: Negative for back pain. Skin: Negative for rash. Neurological: Negative for headaches, weakness or numbness. Psych: No SI or HI  ____________________________________________   PHYSICAL EXAM:  VITAL SIGNS: ED Triage Vitals  Enc Vitals Group     BP 10/25/16 0851 (!) 140/99     Pulse Rate 10/25/16 0851 (!) 107     Resp 10/25/16 0851 16     Temp 10/25/16 0851 97.9 F (36.6 C)     Temp Source 10/25/16 0851 Oral     SpO2 10/25/16 0851 99 %     Weight 10/25/16 0851 170 lb (77.1 kg)     Height --      Head Circumference --      Peak Flow --      Pain Score 10/25/16 0850 8  Pain Loc --      Pain Edu? --      Excl. in [place]? --     Constitutional: Alert and oriented. Well appearing and in no apparent distress. HEENT:      Head: Normocephalic and atraumatic.         Eyes: Conjunctivae are normal. Sclera is non-icteric.       Mouth/Throat: Mucous membranes are moist.       Neck: Supple with no signs of meningismus. Cardiovascular: Tachycardic with regular rhythm. No murmurs, gallops, or rubs. 2+ symmetrical distal pulses are present in all extremities. No JVD. Respiratory: Normal respiratory effort. Lungs are clear to auscultation bilaterally. No wheezes, crackles, or rhonchi.  Gastrointestinal: Soft, non-distended with diffuse moderate tenderness throughout, positive bowel sounds. No rebound or guarding. Genitourinary: No CVA tenderness. Musculoskeletal: Nontender with normal range of motion in all extremities. No edema, cyanosis, or erythema of extremities. Neurologic: Normal speech and language. Face is symmetric. Moving all extremities. No gross focal neurologic deficits are appreciated. Skin: Skin is warm, dry and intact. No rash noted. Psychiatric: Mood  and affect are normal. Speech and behavior are normal.  ____________________________________________   LABS (all labs ordered are listed, but only abnormal results are displayed)  Labs Reviewed  CBC WITH DIFFERENTIAL/PLATELET - Abnormal; Notable for the following:       Result Value   Platelets 113 (*)    Lymphs Abs 4.0 (*)    All other components within normal limits  COMPREHENSIVE METABOLIC PANEL - Abnormal; Notable for the following:    Sodium 130 (*)    Chloride 98 (*)    Glucose, Bld 525 (*)    AST 50 (*)    ALT 94 (*)    All other components within normal limits  URINALYSIS, COMPLETE (UACMP) WITH MICROSCOPIC - Abnormal; Notable for the following:    Color, Urine STRAW (*)    APPearance CLEAR (*)    Specific Gravity, Urine 1.039 (*)    Glucose, UA >=500 (*)    All other components within normal limits  GLUCOSE, CAPILLARY - Abnormal; Notable for the following:    Glucose-Capillary 245 (*)    All other components within normal limits  LIPASE, BLOOD  TROPONIN I   ____________________________________________  EKG  ED ECG REPORT I, Rudene Re, the attending physician, personally viewed and interpreted this ECG.  Normal sinus rhythm, rate of 94, right bundle branch block, normal QTc interval, left axis deviation, no ST elevations or depressions. EKG is unchanged from prior.  ____________________________________________  RADIOLOGY  CT a/p:  1. Apparent circumferential wall thickening involving the horizontal and descending portions of the duodenum, not resulting in enteric obstruction. Findings are nonspecific though could be seen in infectious (duodenitis) and/or inflammatory etiologies. Clinical correlation is advised. Further evaluation with endoscopy could be performed as indicated. 2. Otherwise, no explanation for patient's lower abdominal pain. Specifically, no evidence of enteric or urinary obstruction. 3. Hepatomegaly with mild nodularity hepatic  contour for as could be seen in the setting of early cirrhotic change. Correlation with LFTs is advised. 4. Aortic Atherosclerosis (ICD10-I70.0). ____________________________________________   PROCEDURES  Procedure(s) performed: None Procedures Critical Care performed:  None ____________________________________________   INITIAL IMPRESSION / ASSESSMENT AND PLAN / ED COURSE  56 y.o. male with a history of CAD status post CABG, diabetes, hepatitis C, hypertension, hyperlipidemia who presents for evaluation of 1 week of initially R sided and now diffuse burning abdominal pain. Patient is in no distress, he is tachycardic but  normotensive and afebrile, his abdomen shows diffuse significant tenderness throughout with no rebound or guarding. Differential diagnoses including peptic ulcer disease, gallbladder pathology, diverticulitis, gastritis, pancreatitis. We'll give IV fluids, Zofran and morphine for symptom relief. We'll check CBC, CMP, lipase, urinalysis. EKG unchanged from prior from 04/2016    _________________________ 11:08 AM on 10/25/2016 -----------------------------------------  Patient lab work showing hyperglycemia with no evidence of DKA. Otherwise LFTs are within patient's baseline, CBC within normal limits, UA with no evidence of infection or ketones. Patient received fluids and insulin with improvement of his sugar. He is tolerating by mouth. Patient is currently on metformin 500 twice a day and Lantus. We'll increase metformin to 1000 twice a day and continue Lantus. Recommended that he follows up with his doctor within a day or 2 for further management of his diabetes. CT scan concerning for possible infectious versus inflammatory duodenitis. Patient be discharged home with follow-up with GI and on Pepcid.  Pertinent labs & imaging results that were available during my care of the patient were reviewed by me and considered in my medical decision making (see chart for  details).    ____________________________________________   FINAL CLINICAL IMPRESSION(S) / ED DIAGNOSES  Final diagnoses:  Duodenitis  Type 2 diabetes mellitus with hyperglycemia, unspecified whether long term insulin use (HCC)  Diarrhea of presumed infectious origin      NEW MEDICATIONS STARTED DURING THIS VISIT:  New Prescriptions   FAMOTIDINE (PEPCID) 20 MG TABLET    Take 1 tablet (20 mg total) by mouth 2 (two) times daily.   METFORMIN (GLUCOPHAGE) 500 MG TABLET    Take 2 tablets (1,000 mg total) by mouth 2 (two) times daily with a meal.     Note:  This document was prepared using Dragon voice recognition software and may include unintentional dictation errors.    Alfred Levins, [place], MD 10/25/16 1110

## 2017-04-28 ENCOUNTER — Emergency Department: Payer: Medicare PPO

## 2017-04-28 ENCOUNTER — Other Ambulatory Visit: Payer: Self-pay

## 2017-04-28 ENCOUNTER — Encounter: Payer: Self-pay | Admitting: Emergency Medicine

## 2017-04-28 ENCOUNTER — Emergency Department
Admission: EM | Admit: 2017-04-28 | Discharge: 2017-04-28 | Disposition: A | Discharge status: Home / Self Care | Payer: Medicare PPO | Attending: Emergency Medicine | Admitting: Emergency Medicine

## 2017-04-28 DIAGNOSIS — R059 Cough, unspecified: Secondary | ICD-10-CM

## 2017-04-28 DIAGNOSIS — R05 Cough: Secondary | ICD-10-CM

## 2017-04-28 DIAGNOSIS — Z7982 Long term (current) use of aspirin: Secondary | ICD-10-CM | POA: Diagnosis not present

## 2017-04-28 DIAGNOSIS — I2510 Atherosclerotic heart disease of native coronary artery without angina pectoris: Secondary | ICD-10-CM | POA: Insufficient documentation

## 2017-04-28 DIAGNOSIS — Z794 Long term (current) use of insulin: Secondary | ICD-10-CM | POA: Insufficient documentation

## 2017-04-28 DIAGNOSIS — J449 Chronic obstructive pulmonary disease, unspecified: Secondary | ICD-10-CM | POA: Diagnosis not present

## 2017-04-28 DIAGNOSIS — F17210 Nicotine dependence, cigarettes, uncomplicated: Secondary | ICD-10-CM | POA: Insufficient documentation

## 2017-04-28 DIAGNOSIS — I10 Essential (primary) hypertension: Secondary | ICD-10-CM | POA: Diagnosis not present

## 2017-04-28 DIAGNOSIS — Z79899 Other long term (current) drug therapy: Secondary | ICD-10-CM | POA: Insufficient documentation

## 2017-04-28 DIAGNOSIS — J45909 Unspecified asthma, uncomplicated: Secondary | ICD-10-CM | POA: Insufficient documentation

## 2017-04-28 DIAGNOSIS — R739 Hyperglycemia, unspecified: Secondary | ICD-10-CM

## 2017-04-28 DIAGNOSIS — R0602 Shortness of breath: Secondary | ICD-10-CM | POA: Diagnosis present

## 2017-04-28 DIAGNOSIS — E1165 Type 2 diabetes mellitus with hyperglycemia: Secondary | ICD-10-CM | POA: Diagnosis not present

## 2017-04-28 DIAGNOSIS — G8929 Other chronic pain: Secondary | ICD-10-CM | POA: Insufficient documentation

## 2017-04-28 LAB — CBC
HEMATOCRIT: 47.7 % (ref 40.0–52.0)
Hemoglobin: 15.6 g/dL (ref 13.0–18.0)
MCH: 28.3 pg (ref 26.0–34.0)
MCHC: 32.7 g/dL (ref 32.0–36.0)
MCV: 86.5 fL (ref 80.0–100.0)
PLATELETS: 146 10*3/uL — AB (ref 150–440)
RBC: 5.52 MIL/uL (ref 4.40–5.90)
RDW: 13.5 % (ref 11.5–14.5)
WBC: 8.9 10*3/uL (ref 3.8–10.6)

## 2017-04-28 LAB — BASIC METABOLIC PANEL
Anion gap: 8 (ref 5–15)
BUN: 13 mg/dL (ref 6–20)
CHLORIDE: 99 mmol/L — AB (ref 101–111)
CO2: 25 mmol/L (ref 22–32)
CREATININE: 0.79 mg/dL (ref 0.61–1.24)
Calcium: 9.5 mg/dL (ref 8.9–10.3)
GFR calc Af Amer: >60 mL/min (ref >60)
GFR calc non Af Amer: >60 mL/min (ref >60)
GLUCOSE: 430 mg/dL — AB (ref 65–99)
Potassium: 4.8 mmol/L (ref 3.5–5.1)
SODIUM: 132 mmol/L — AB (ref 135–145)

## 2017-04-28 LAB — TROPONIN I: Troponin I: <0.03 ng/mL (ref <0.03)

## 2017-04-28 LAB — GLUCOSE, CAPILLARY: Glucose-Capillary: 324 mg/dL — ABNORMAL HIGH (ref 65–99)

## 2017-04-28 MED ORDER — ALBUTEROL SULFATE (2.5 MG/3ML) 0.083% IN NEBU
5.0000 mg | INHALATION_SOLUTION | Freq: Once | RESPIRATORY_TRACT | Status: AC
  Administered 2017-04-28: 5 mg via RESPIRATORY_TRACT
  Filled 2017-04-28: qty 6

## 2017-04-28 MED ORDER — GUAIFENESIN-CODEINE 100-10 MG/5ML PO SOLN: 10.0000 mL | Freq: Three times a day (TID) | ORAL | 0 refills | Status: DC | PRN

## 2017-04-28 MED ORDER — SODIUM CHLORIDE 0.9 % IV BOLUS (SEPSIS)
1000.0000 mL | Freq: Once | INTRAVENOUS | Status: AC
  Administered 2017-04-28: 1000 mL via INTRAVENOUS

## 2017-04-28 MED ORDER — ALBUTEROL SULFATE HFA 108 (90 BASE) MCG/ACT IN AERS: 2.0000 | INHALATION_SPRAY | RESPIRATORY_TRACT | 1 refills | Status: DC | PRN

## 2017-04-28 NOTE — ED Triage Notes (Signed)
Pt c/o SHOB and cough for about 1 week. Denies pain. No fevers.  Also would like to be checked for spider bite to back.  Unlabored currently but breath sounds significantly diminished. Treatment given in triage.

## 2017-04-28 NOTE — ED Provider Notes (Signed)
[facility] Emergency Department Provider Note  ___________________________________________   None    (approximate)  I have reviewed the triage vital signs and the nursing notes.   HISTORY  Chief Complaint Shortness of Breath   HPI Kyle [name] is a 56 y.o. male who presents to the emergency department for evaluation of shortness of breath and cough for about a week. No known fever. Also states that he was bit on his back by a spider earlier today and would like his back checked out due to itching and burning sensation at the site. He has a history of diabetes, but has not taken his medications today. His glucose typically runs about 250.    Past Medical History:  Diagnosis Date  . Allergy   . Asthma   . Chronic back pain 1981   a.followed by pain clinic and neurosurgery.  Marland Kitchen COPD (chronic obstructive pulmonary disease) ([place])   . Coronary artery disease involving native coronary artery    a. 10/2012 CABG x 4 @ [facility] (LIMA->LAD, VG->RI, VG->OM, VG->PDA);  b. 08/2014 Lexiscan MV: small defect of mild severity present along apical lateral & apex location, no st segment changes, LV function 45-54%, poor gated images lead to poor EF & wall motion assessment, low risk study.  . DDD (degenerative disc disease)   . DM2 (diabetes mellitus, type 2) ([place])   . Fainting spell   . GERD (gastroesophageal reflux disease)   . Hepatitis C    2/2 tattoo  . Hyperlipidemia with target LDL less than 70   . Hypertension   . Polysubstance abuse ([place])    a. ongoing daily tobacco abuse and etoh (on the weekends), remote crack/cocaine abuse last used approximately in 2006  . S/P CABG x 4    a. LIMA-LAD, SVG-Ramus, SVG-OM,SVG-dRCA  . Urine incontinence     Patient Active Problem List   Diagnosis Date Noted  . DDD (degenerative disc disease), lumbar 01/10/2015  . Facet syndrome, lumbar 01/10/2015  . Lumbar radiculopathy 01/10/2015  . DDD (degenerative disc  disease), cervical 11/22/2014  . Cervical facet syndrome 11/22/2014  . Bilateral occipital neuralgia 11/22/2014  . Hospital discharge follow-up 10/16/2014  . Coronary artery disease involving native coronary artery   . S/P CABG x 4   . Chest pain with moderate risk for cardiac etiology 09/27/2014  . Cervicalgia 07/05/2014  . Acute bacterial rhinosinusitis 06/15/2014  . Non compliance w medication regimen 06/07/2014  . Chest tightness 06/07/2014  . Tobacco abuse counseling 01/05/2014  . Vitamin D deficiency 01/05/2014  . Transaminitis 01/05/2014  . Temporary low platelet count ([place]) 01/05/2014  . Diabetes mellitus type 2, uncontrolled ([place]) 01/05/2014  . Back pain 10/01/2013  . Need for prophylactic vaccination with tetanus-diphtheria (Td) 10/01/2013  . Penile lesion 10/01/2013  . Essential hypertension 08/29/2013  . Hyperlipidemia with target LDL less than 70 08/29/2013  . Callus of foot 08/29/2013  . Blurred vision 08/29/2013  . Itching 08/29/2013  . Exacerbation of chronic back pain 06/07/2013  . Hyperlipidemia 03/02/2013  . Former smoker 03/02/2013  . Routine general medical examination at a health care facility 02/24/2013  . Hepatitis C 02/24/2013  . Diabetes type 2, uncontrolled ([place]) 02/24/2013    Past Surgical History:  Procedure Laterality Date  . CARDIAC CATHETERIZATION  11/2012   [facility] - Multivessel CAD  . CORONARY ARTERY BYPASS GRAFT  8/14   CABG x 4; LIMA-LAD, SVG-RI, SVG-OM1, SVG-rPDA (Atretic RIMA).  . neck injection  Prior to Admission medications   Medication Sig Start Date End Date Taking? Authorizing Provider  albuterol (PROVENTIL HFA;VENTOLIN HFA) 108 (90 Base) MCG/ACT inhaler Inhale 2 puffs into the lungs every 4 (four) hours as needed for wheezing or shortness of breath. 04/28/17   Damontay Alred, Johnette Abraham B, FNP  aspirin EC 81 MG tablet Take 1 tablet (81 mg total) by mouth daily. 05/17/16   Minna Merritts, MD  famotidine (PEPCID) 20 MG  tablet Take 1 tablet (20 mg total) by mouth 2 (two) times daily. 10/25/16 10/25/17  Rudene Re, MD  guaiFENesin-codeine 100-10 MG/5ML syrup Take 10 mLs by mouth 3 (three) times daily as needed. 04/28/17   Lesslie Mckeehan B, FNP  insulin glargine (LANTUS) 100 UNIT/ML injection Inject 30 Units into the skin 2 (two) times daily.    [provider]  isosorbide mononitrate (IMDUR) 30 MG 24 hr tablet Take 1 tablet (30 mg total) by mouth daily. 05/17/16   Minna Merritts, MD  meloxicam (MOBIC) 15 MG tablet Take 1 tablet (15 mg total) by mouth daily. Patient taking differently: Take 15 mg by mouth daily as needed for pain.  08/20/16 08/20/17  Menshew, Dannielle Karvonen, PA-C  metFORMIN (GLUCOPHAGE) 500 MG tablet Take 2 tablets (1,000 mg total) by mouth 2 (two) times daily with a meal. 10/25/16 10/25/17  Alfred Levins, [place], MD  metoprolol succinate (TOPROL-XL) 25 MG 24 hr tablet Take 50 mg by mouth daily.    [provider]  nitroGLYCERIN (NITROSTAT) 0.4 MG SL tablet Place 1 tablet (0.4 mg total) under the tongue every 5 (five) minutes as needed for chest pain. 09/29/14   Aldean Jewett, MD  rosuvastatin (CRESTOR) 10 MG tablet Take 10 mg by mouth at bedtime.    [provider]    Allergies Amoxicillin; Flexeril [cyclobenzaprine]; Methadone; Tramadol; and Trazodone and nefazodone  Family History  Problem Relation Age of Onset  . Heart disease Mother   . Heart disease Father   . Hypertension Father   . Diabetes Sister   . Cancer Sister        skin cancer  . Diabetes Other   . Heart disease Other     Social History Social History   Tobacco Use  . Smoking status: Current Every Day Smoker    Packs/day: 0.25    Years: 35.00    Pack years: 8.75    Types: Cigarettes  . Smokeless tobacco: Never Used  . Tobacco comment: a pack lasts 2 to 4 days   Substance Use Topics  . Alcohol use: Yes    Alcohol/week: 0.0 oz    Comment: 2-3 beers/week  . Drug use: No    Review  of Systems  Constitutional: No fever/chills Eyes: No visual changes. ENT: No sore throat. Cardiovascular: Denies chest pain. Respiratory: Positive for shortness of breath. Gastrointestinal: No abdominal pain.  No nausea, no vomiting.  No diarrhea.  No constipation. Genitourinary: Negative for dysuria. Musculoskeletal: Negative for back pain. Skin: Negative for rash. Neurological: Positive for headaches and intermittent dizziness, no focal weakness or numbness. ____________________________________________   PHYSICAL EXAM:  VITAL SIGNS: ED Triage Vitals  Enc Vitals Group     BP 04/28/17 1318 (!) 157/106     Pulse Rate 04/28/17 1318 91     Resp 04/28/17 1318 18     Temp 04/28/17 1318 98.5 F (36.9 C)     Temp Source 04/28/17 1318 Oral     SpO2 04/28/17 1318 98 %     Weight  04/28/17 1319 170 lb (77.1 kg)     Height 04/28/17 1319 5\' 7"  (1.702 m)     Head Circumference --      Peak Flow --      Pain Score --      Pain Loc --      Pain Edu? --      Excl. in [place]? --     Constitutional: Alert and oriented. Well appearing and in no acute distress. Eyes: Conjunctivae are normal. PERRL. EOMI. Head: Atraumatic. Nose: No congestion/rhinnorhea. Mouth/Throat: Mucous membranes are moist.  Oropharynx non-erythematous. Neck: No stridor.   Cardiovascular: Normal rate, regular rhythm. Grossly normal heart sounds.  Good peripheral circulation. Respiratory: Normal respiratory effort.  No retractions. Lungs CTAB. Gastrointestinal: Soft and nontender. No distention. No abdominal bruits. No CVA tenderness. Musculoskeletal: No lower extremity tenderness nor edema.  No joint effusions. Neurologic:  Normal speech and language. No gross focal neurologic deficits are appreciated. No gait instability. Skin:  Skin is warm, dry and intact. No rash noted. Psychiatric: Mood and affect are normal. Speech and behavior are normal.  ____________________________________________   LABS (all labs ordered  are listed, but only abnormal results are displayed)  Labs Reviewed  BASIC METABOLIC PANEL - Abnormal; Notable for the following components:      Result Value   Sodium 132 (*)    Chloride 99 (*)    Glucose, Bld 430 (*)    All other components within normal limits  CBC - Abnormal; Notable for the following components:   Platelets 146 (*)    All other components within normal limits  GLUCOSE, CAPILLARY - Abnormal; Notable for the following components:   Glucose-Capillary 324 (*)    All other components within normal limits  TROPONIN I  CBG MONITORING, ED  CBG MONITORING, ED  CBG MONITORING, ED  CBG MONITORING, ED  CBG MONITORING, ED  CBG MONITORING, ED  CBG MONITORING, ED  CBG MONITORING, ED  CBG MONITORING, ED  CBG MONITORING, ED  CBG MONITORING, ED  CBG MONITORING, ED  CBG MONITORING, ED  CBG MONITORING, ED   ____________________________________________  EKG  Normal sinus rhythm with a ventricular rate of 86, left axis deviation, no ST elevation, right bundle branch block, left anterior fascicular block, and left ventricular hypertrophy. ____________________________________________  RADIOLOGY  Dg Chest 2 View  Result Date: 04/28/2017 CLINICAL DATA:  Shortness of breath and cough EXAM: CHEST  2 VIEW COMPARISON:  09/26/2016 FINDINGS: Cardiac shadow is within normal limits. Postoperative changes are again noted. No focal infiltrate or sizable effusion is seen. No acute bony abnormality is noted. IMPRESSION: No active cardiopulmonary disease. Electronically Signed   By: Inez Catalina M.D.   On: 04/28/2017 13:44  __________________________________________   PROCEDURES  Procedure(s) performed: None  Procedures  Critical Care performed: No  ____________________________________________   INITIAL IMPRESSION / ASSESSMENT AND PLAN / ED COURSE  As part of my medical decision making, I reviewed the following data within the electronic MEDICAL RECORD NUMBER Notes from prior ED  visits   56 year old male presenting to the emergency department for evaluation of multiple medical complaints which include shortness of breath and cough for approximately 1 week.  Apparently he had diminished breath sounds in triage and was given an albuterol treatment with relief.  On exam, his lungs were clear to auscultation without adventitious sounds.  Patient states that he has been eating basically whenever he wants and has not been adhering to a diabetic diet over the holidays.  Also, he  has not taken his insulin or metformin today.  He does state that he took it yesterday.  While here, his exam, labs, chest x-ray, and EKG are all reassuring.  He received 1 L of fluids which significantly lowered his glucose.  He will be discharged home and strongly encouraged to take his medications as prescribed and stay on his diabetic diet. He is to follow-up with his primary care provider for symptoms that do not seem to be resolving.  He will be given an albuterol inhaler and Robitussin-AC for his cough.  He is to return to the emergency department for symptoms or worsen if unable to schedule an appointment. Plan discussed with the patient who verbalizes understanding.      ____________________________________________   FINAL CLINICAL IMPRESSION(S) / ED DIAGNOSES  Final diagnoses:  Hyperglycemia  Cough     ED Discharge Orders        Ordered    albuterol (PROVENTIL HFA;VENTOLIN HFA) 108 (90 Base) MCG/ACT inhaler  Every 4 hours PRN     04/28/17 1724    guaiFENesin-codeine 100-10 MG/5ML syrup  3 times daily PRN     04/28/17 1724       Note:  This document was prepared using Dragon voice recognition software and may include unintentional dictation errors.    Victorino Dike, FNP 04/28/17 1729    Nance Pear, MD 04/28/17 [phone]

## 2017-04-28 NOTE — ED Provider Notes (Signed)
Apolonio Schneiders, attending physician, personally viewed and interpreted this EKG  EKG Time: 1325 Rate: 86 Rhythm: normal sinus rhythm Axis: left axis deviation Intervals: qtc 476 QRS: RBBB, LAFB, LVH ST changes: no st elevation Impression: abnormal ekg    Nance Pear, MD 04/28/17 1712

## 2017-07-09 IMAGING — CR DG CHEST 2V
2 series · 2 of 2 positions shown · non-contrast
Comparison: 03/22/2016

CLINICAL DATA: Cough for approximately 2 weeks.

EXAM:
CHEST  2 VIEW

[chest pa]
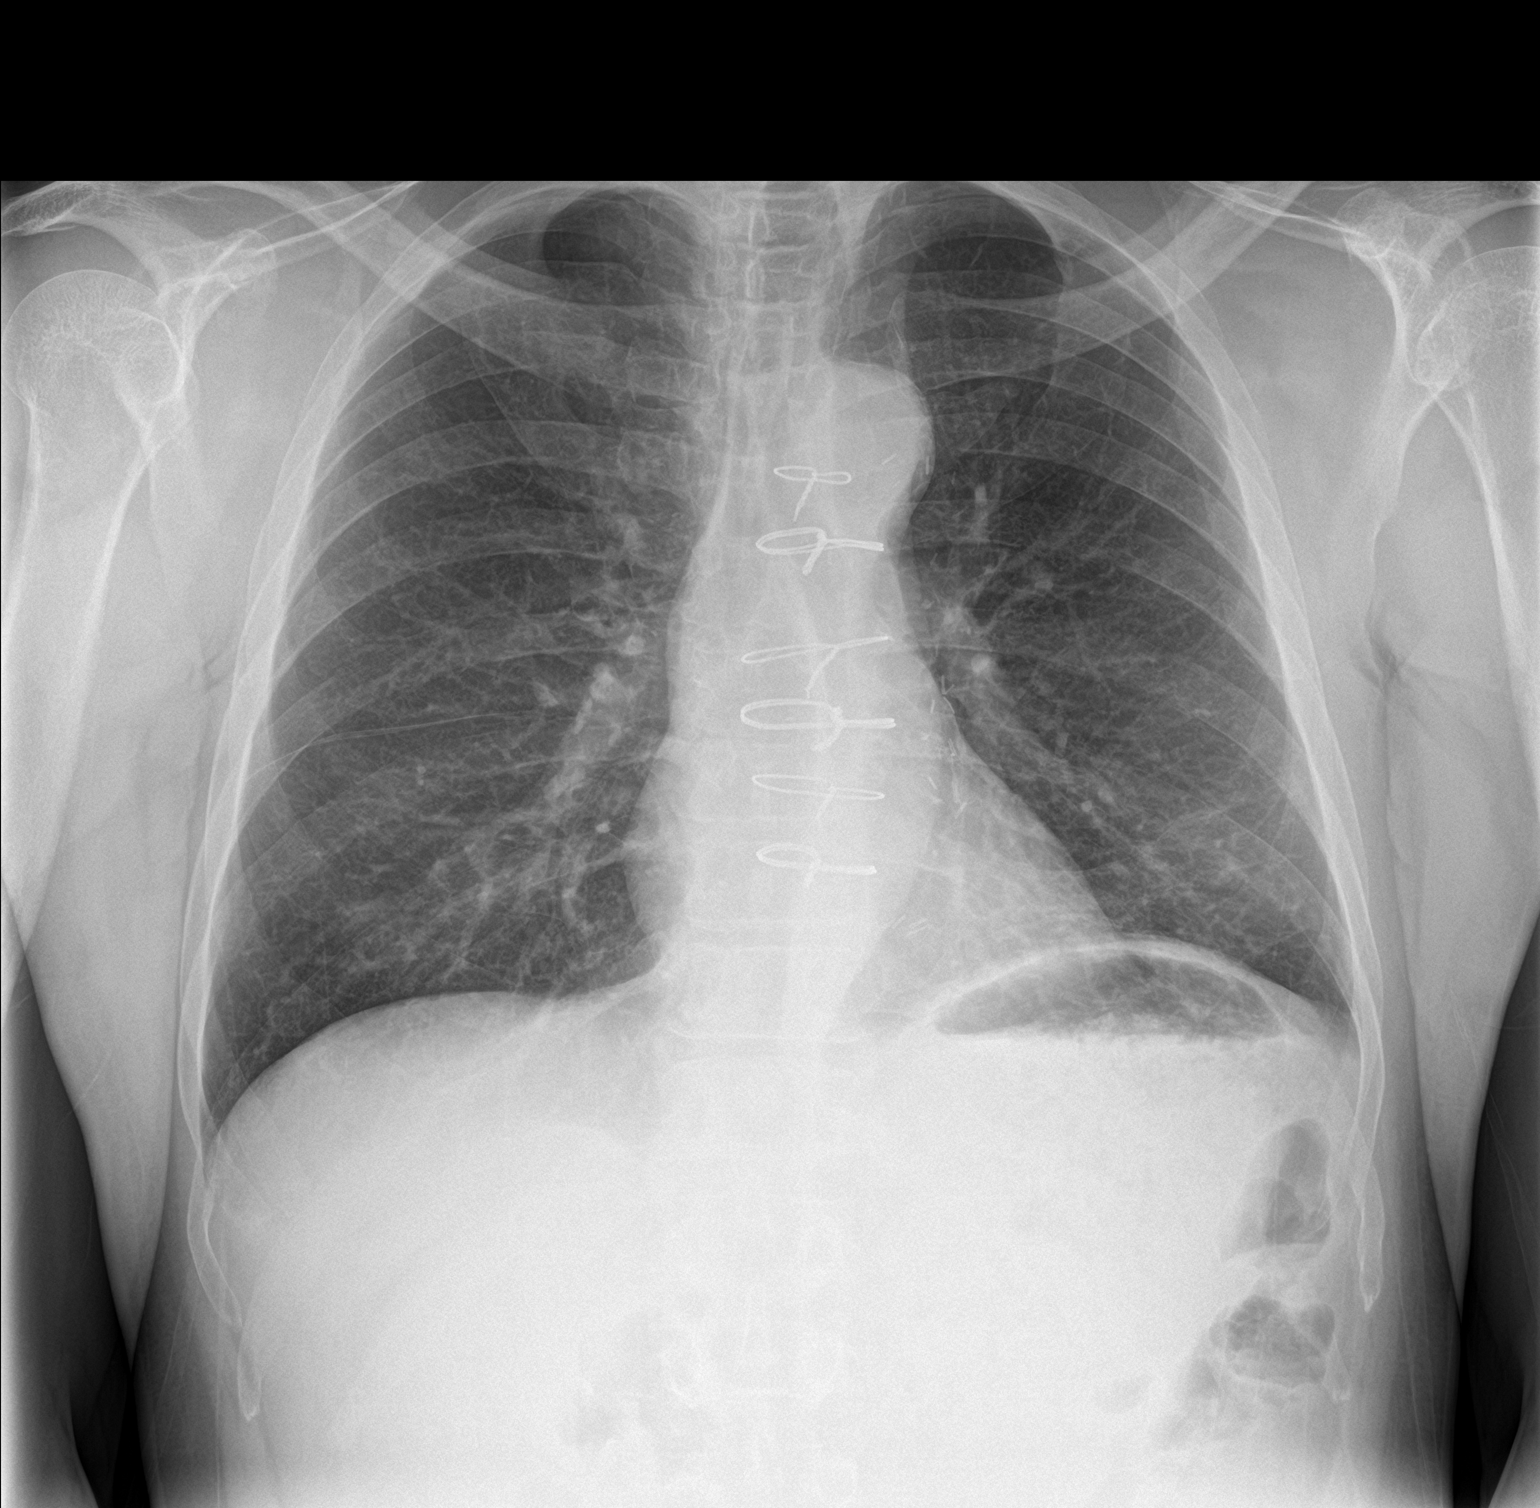

[chest lat]
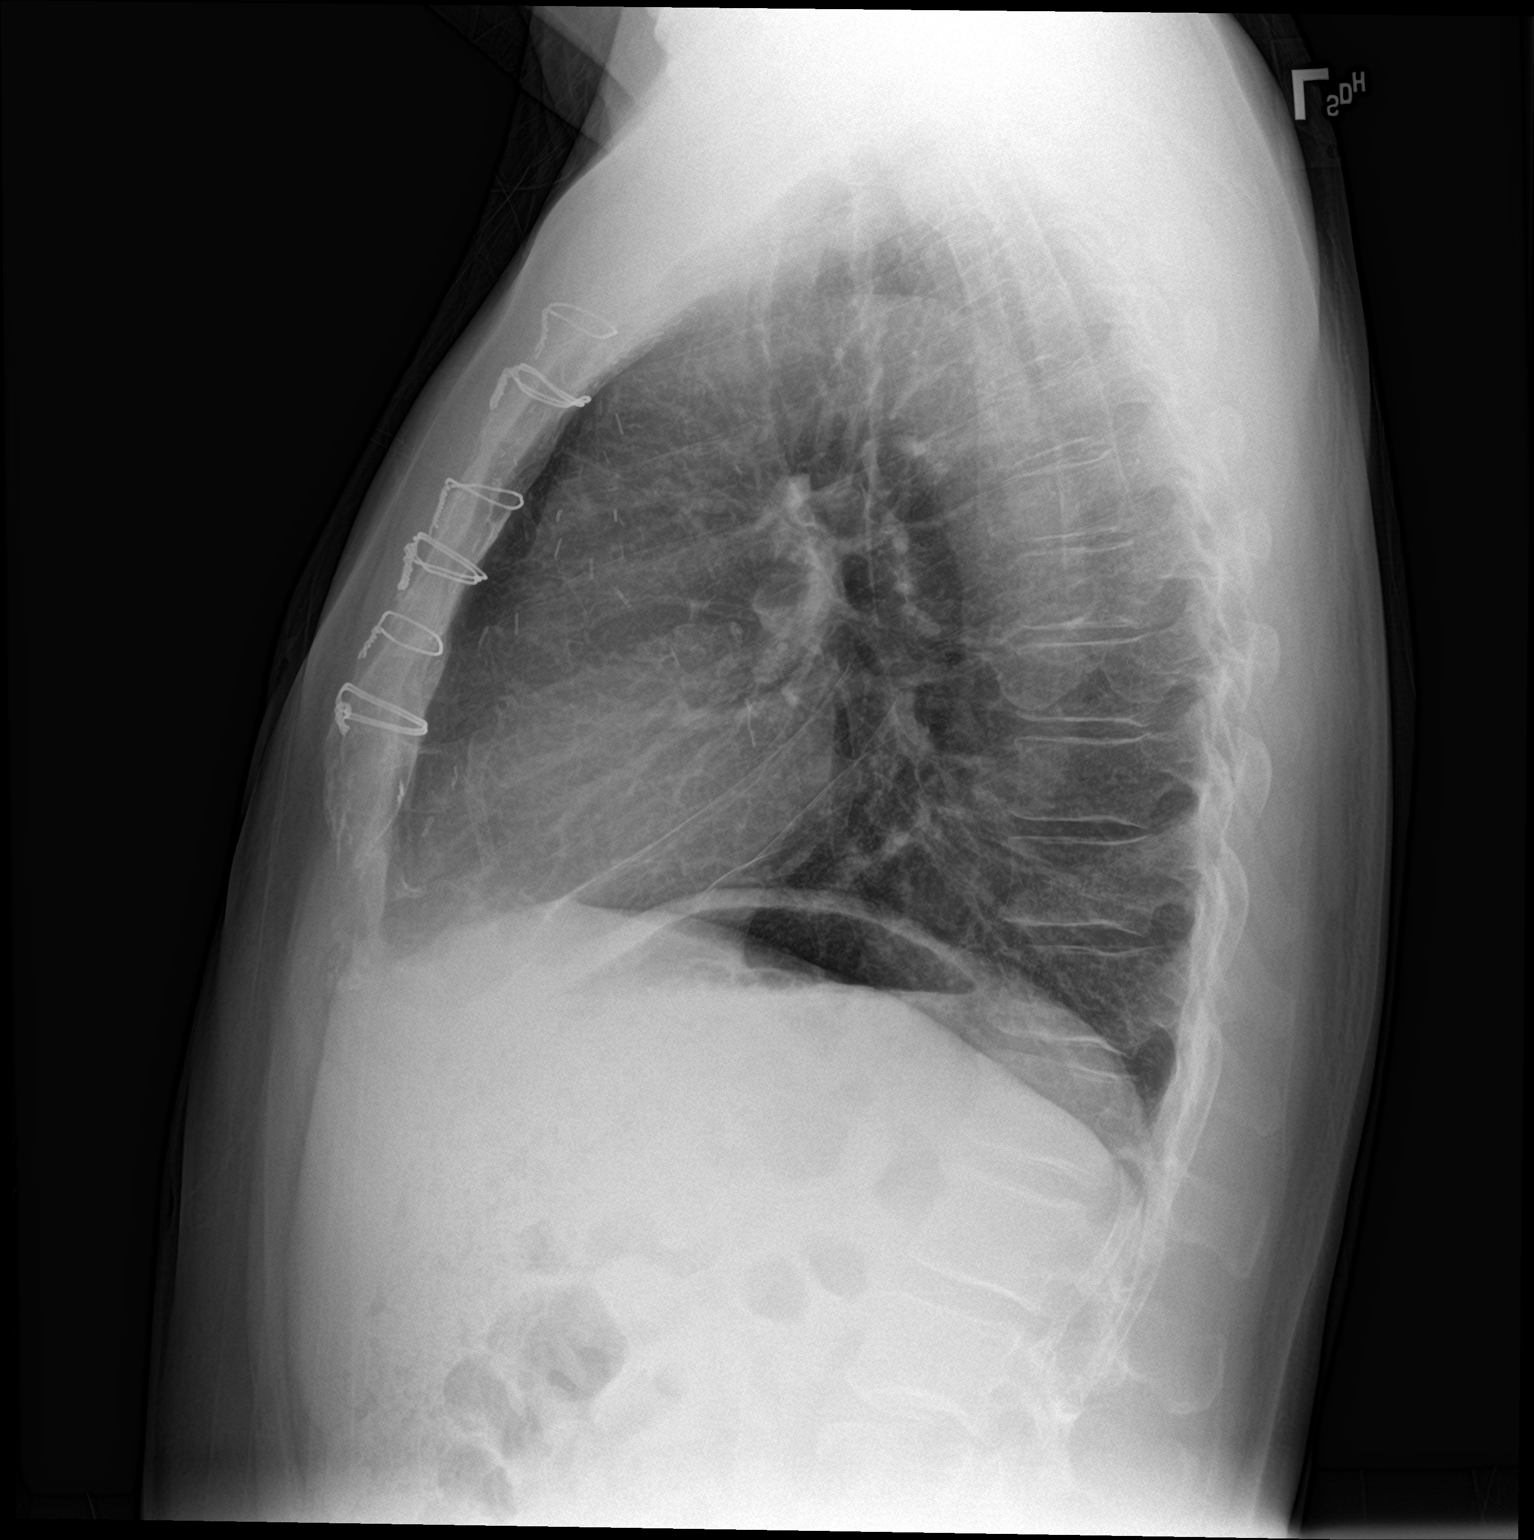

[2 of 2 positions shown; findings below may reference images not displayed]

FINDINGS: Right lung clear. Subtle atelectasis or pneumonia noted left lung
base. The cardiopericardial silhouette is within normal limits for
size. Patient is status post CABG The visualized bony structures of
the thorax are intact.
IMPRESSION: Subtle atelectasis or pneumonia at the left lung base.

## 2018-01-18 ENCOUNTER — Emergency Department
Admission: EM | Admit: 2018-01-18 | Discharge: 2018-01-18 | Disposition: A | Discharge status: Home / Self Care | Payer: Medicare PPO | Attending: Student in an Organized Health Care Education/Training Program | Admitting: Student in an Organized Health Care Education/Training Program

## 2018-01-18 ENCOUNTER — Other Ambulatory Visit: Payer: Self-pay

## 2018-01-18 ENCOUNTER — Emergency Department: Payer: Medicare PPO

## 2018-01-18 ENCOUNTER — Encounter: Payer: Self-pay | Admitting: Emergency Medicine

## 2018-01-18 DIAGNOSIS — Z794 Long term (current) use of insulin: Secondary | ICD-10-CM | POA: Diagnosis not present

## 2018-01-18 DIAGNOSIS — I10 Essential (primary) hypertension: Secondary | ICD-10-CM | POA: Diagnosis not present

## 2018-01-18 DIAGNOSIS — R0981 Nasal congestion: Secondary | ICD-10-CM | POA: Insufficient documentation

## 2018-01-18 DIAGNOSIS — I2510 Atherosclerotic heart disease of native coronary artery without angina pectoris: Secondary | ICD-10-CM | POA: Diagnosis not present

## 2018-01-18 DIAGNOSIS — Z79899 Other long term (current) drug therapy: Secondary | ICD-10-CM | POA: Diagnosis not present

## 2018-01-18 DIAGNOSIS — Z7982 Long term (current) use of aspirin: Secondary | ICD-10-CM | POA: Diagnosis not present

## 2018-01-18 DIAGNOSIS — Z951 Presence of aortocoronary bypass graft: Secondary | ICD-10-CM | POA: Diagnosis not present

## 2018-01-18 DIAGNOSIS — J45909 Unspecified asthma, uncomplicated: Secondary | ICD-10-CM | POA: Diagnosis not present

## 2018-01-18 DIAGNOSIS — F17210 Nicotine dependence, cigarettes, uncomplicated: Secondary | ICD-10-CM | POA: Diagnosis not present

## 2018-01-18 DIAGNOSIS — R1084 Generalized abdominal pain: Secondary | ICD-10-CM | POA: Diagnosis not present

## 2018-01-18 DIAGNOSIS — E119 Type 2 diabetes mellitus without complications: Secondary | ICD-10-CM | POA: Insufficient documentation

## 2018-01-18 LAB — URINALYSIS, COMPLETE (UACMP) WITH MICROSCOPIC
BACTERIA UA: NONE SEEN
Bilirubin Urine: NEGATIVE
Glucose, UA: >=500 mg/dL — AB
Hgb urine dipstick: NEGATIVE
KETONES UR: NEGATIVE mg/dL
Leukocytes, UA: NEGATIVE
Nitrite: NEGATIVE
PROTEIN: NEGATIVE mg/dL
SQUAMOUS EPITHELIAL / LPF: NONE SEEN (ref 0–5)
Specific Gravity, Urine: 1.026 (ref 1.005–1.030)
pH: 5 (ref 5.0–8.0)

## 2018-01-18 LAB — BASIC METABOLIC PANEL
Anion gap: 6 (ref 5–15)
BUN: 9 mg/dL (ref 6–20)
CHLORIDE: 105 mmol/L (ref 98–111)
CO2: 28 mmol/L (ref 22–32)
Calcium: 9.2 mg/dL (ref 8.9–10.3)
Creatinine, Ser: 0.82 mg/dL (ref 0.61–1.24)
GFR calc Af Amer: >60 mL/min (ref >60)
GFR calc non Af Amer: >60 mL/min (ref >60)
GLUCOSE: 252 mg/dL — AB (ref 70–99)
POTASSIUM: 4 mmol/L (ref 3.5–5.1)
Sodium: 139 mmol/L (ref 135–145)

## 2018-01-18 LAB — CBC
HCT: 43.7 % (ref 40.0–52.0)
HEMOGLOBIN: 14.8 g/dL (ref 13.0–18.0)
MCH: 29.4 pg (ref 26.0–34.0)
MCHC: 33.9 g/dL (ref 32.0–36.0)
MCV: 86.9 fL (ref 80.0–100.0)
Platelets: 116 10*3/uL — ABNORMAL LOW (ref 150–440)
RBC: 5.03 MIL/uL (ref 4.40–5.90)
RDW: 14 % (ref 11.5–14.5)
WBC: 7.7 10*3/uL (ref 3.8–10.6)

## 2018-01-18 LAB — HEPATIC FUNCTION PANEL
ALBUMIN: 4 g/dL (ref 3.5–5.0)
ALT: 78 U/L — ABNORMAL HIGH (ref 0–44)
AST: 41 U/L (ref 15–41)
Alkaline Phosphatase: 58 U/L (ref 38–126)
TOTAL PROTEIN: 7.1 g/dL (ref 6.5–8.1)
Total Bilirubin: 0.5 mg/dL (ref 0.3–1.2)

## 2018-01-18 MED ORDER — IOPAMIDOL (ISOVUE-300) INJECTION 61%
100.0000 mL | Freq: Once | INTRAVENOUS | Status: AC | PRN
  Administered 2018-01-18: 100 mL via INTRAVENOUS

## 2018-01-18 MED ORDER — AZITHROMYCIN 250 MG PO TABS: ORAL_TABLET | ORAL | 0 refills | Status: AC

## 2018-01-18 MED ORDER — SODIUM CHLORIDE 0.9 % IV BOLUS
500.0000 mL | Freq: Once | INTRAVENOUS | Status: AC
  Administered 2018-01-18: 500 mL via INTRAVENOUS

## 2018-01-18 NOTE — ED Provider Notes (Signed)
[facility] Emergency Department Provider Note    First MD Initiated Contact with Patient 01/18/18 1316     (approximate)  I have reviewed the triage vital signs and the nursing notes.   HISTORY  Chief Complaint Weakness and sinus congestion    HPI Kyle [name] is a 57 y.o. male with extensive past medical history presents to the ER for 1 week of generalized vague abdominal pain associate with decreased oral intake nasal congestion and nonproductive cough.  No measured fevers.  No active vomiting.  Has had some non-melanotic nonbloody stool that is more loose and duration.  The pain is nonradiating.  Does smoke 1 to 2 cigarettes a day.    Past Medical History:  Diagnosis Date  . Allergy   . Asthma   . Chronic back pain 1981   a.followed by pain clinic and neurosurgery.  Marland Kitchen COPD (chronic obstructive pulmonary disease) ([place])   . Coronary artery disease involving native coronary artery    a. 10/2012 CABG x 4 @ [facility] (LIMA->LAD, VG->RI, VG->OM, VG->PDA);  b. 08/2014 Lexiscan MV: small defect of mild severity present along apical lateral & apex location, no st segment changes, LV function 45-54%, poor gated images lead to poor EF & wall motion assessment, low risk study.  . DDD (degenerative disc disease)   . DM2 (diabetes mellitus, type 2) ([place])   . Fainting spell   . GERD (gastroesophageal reflux disease)   . Hepatitis C    2/2 tattoo  . Hyperlipidemia with target LDL less than 70   . Hypertension   . Polysubstance abuse ([place])    a. ongoing daily tobacco abuse and etoh (on the weekends), remote crack/cocaine abuse last used approximately in 2006  . S/P CABG x 4    a. LIMA-LAD, SVG-R[place], SVG-OM,SVG-dRCA  . Urine incontinence    Family History  Problem Relation Age of Onset  . Heart disease Mother   . Heart disease Father   . Hypertension Father   . Diabetes Sister   . Cancer Sister        skin cancer  . Diabetes Other   . Heart  disease Other    Past Surgical History:  Procedure Laterality Date  . CARDIAC CATHETERIZATION  11/2012   [facility] - Multivessel CAD  . CORONARY ARTERY BYPASS GRAFT  8/14   CABG x 4; LIMA-LAD, SVG-RI, SVG-OM1, SVG-rPDA (Atretic RIMA).  . neck injection     Patient Active Problem List   Diagnosis Date Noted  . DDD (degenerative disc disease), lumbar 01/10/2015  . Facet syndrome, lumbar 01/10/2015  . Lumbar radiculopathy 01/10/2015  . DDD (degenerative disc disease), cervical 11/22/2014  . Cervical facet syndrome 11/22/2014  . Bilateral occipital neuralgia 11/22/2014  . Hospital discharge follow-up 10/16/2014  . Coronary artery disease involving native coronary artery   . S/P CABG x 4   . Chest pain with moderate risk for cardiac etiology 09/27/2014  . Cervicalgia 07/05/2014  . Acute bacterial rhinosinusitis 06/15/2014  . Non compliance w medication regimen 06/07/2014  . Chest tightness 06/07/2014  . Tobacco abuse counseling 01/05/2014  . Vitamin D deficiency 01/05/2014  . Transaminitis 01/05/2014  . Temporary low platelet count ([place]) 01/05/2014  . Diabetes mellitus type 2, uncontrolled ([place]) 01/05/2014  . Back pain 10/01/2013  . Need for prophylactic vaccination with tetanus-diphtheria (Td) 10/01/2013  . Penile lesion 10/01/2013  . Essential hypertension 08/29/2013  . Hyperlipidemia with target LDL less than 70 08/29/2013  .  Callus of foot 08/29/2013  . Blurred vision 08/29/2013  . Itching 08/29/2013  . Exacerbation of chronic back pain 06/07/2013  . Hyperlipidemia 03/02/2013  . Former smoker 03/02/2013  . Routine general medical examination at a health care facility 02/24/2013  . Hepatitis C 02/24/2013  . Diabetes type 2, uncontrolled ([place]) 02/24/2013      Prior to Admission medications   Medication Sig Start Date End Date Taking? Authorizing Provider  albuterol (PROVENTIL HFA;VENTOLIN HFA) 108 (90 Base) MCG/ACT inhaler Inhale 2 puffs into the lungs every  4 (four) hours as needed for wheezing or shortness of breath. 04/28/17   Triplett, Johnette Abraham B, FNP  aspirin EC 81 MG tablet Take 1 tablet (81 mg total) by mouth daily. 05/17/16   Minna Merritts, MD  azithromycin (ZITHROMAX Z-PAK) 250 MG tablet Take 2 tablets (500 mg) on  Day 1,  followed by 1 tablet (250 mg) once daily on Days 2 through 5. 01/18/18 01/23/18  Merlyn Lot, MD  famotidine (PEPCID) 20 MG tablet Take 1 tablet (20 mg total) by mouth 2 (two) times daily. 10/25/16 10/25/17  Rudene Re, MD  guaiFENesin-codeine 100-10 MG/5ML syrup Take 10 mLs by mouth 3 (three) times daily as needed. 04/28/17   Triplett, Cari B, FNP  insulin glargine (LANTUS) 100 UNIT/ML injection Inject 30 Units into the skin 2 (two) times daily.    [provider]  isosorbide mononitrate (IMDUR) 30 MG 24 hr tablet Take 1 tablet (30 mg total) by mouth daily. 05/17/16   Minna Merritts, MD  metFORMIN (GLUCOPHAGE) 500 MG tablet Take 2 tablets (1,000 mg total) by mouth 2 (two) times daily with a meal. 10/25/16 10/25/17  Alfred Levins, [place], MD  metoprolol succinate (TOPROL-XL) 25 MG 24 hr tablet Take 50 mg by mouth daily.    [provider]  nitroGLYCERIN (NITROSTAT) 0.4 MG SL tablet Place 1 tablet (0.4 mg total) under the tongue every 5 (five) minutes as needed for chest pain. 09/29/14   Aldean Jewett, MD  rosuvastatin (CRESTOR) 10 MG tablet Take 10 mg by mouth at bedtime.    [provider]    Allergies Amoxicillin; Flexeril [cyclobenzaprine]; Methadone; Tramadol; and Trazodone and nefazodone    Social History Social History   Tobacco Use  . Smoking status: Current Every Day Smoker    Packs/day: 0.25    Years: 35.00    Pack years: 8.75    Types: Cigarettes  . Smokeless tobacco: Never Used  . Tobacco comment: a pack lasts 2 to 4 days   Substance Use Topics  . Alcohol use: Yes    Alcohol/week: 0.0 standard drinks    Comment: 2-3 beers/week  . Drug use: No    Review of  Systems Patient denies headaches, rhinorrhea, blurry vision, numbness, shortness of breath, chest pain, edema, cough, abdominal pain, nausea, vomiting, diarrhea, dysuria, fevers, rashes or hallucinations unless otherwise stated above in HPI. ____________________________________________   PHYSICAL EXAM:  VITAL SIGNS: Vitals:   01/18/18 1006  BP: (!) 140/94  Pulse: 85  Resp: 16  Temp: 98.4 F (36.9 C)  SpO2: 99%    Constitutional: Alert and oriented.  Eyes: Conjunctivae are normal.  Head: Atraumatic. Nose: No congestion/rhinnorhea. Mouth/Throat: Mucous membranes are moist.   Neck: No stridor. Painless ROM.  Cardiovascular: Normal rate, regular rhythm. Grossly normal heart sounds.  Good peripheral circulation. Respiratory: Normal respiratory effort.  No retractions. Lungs CTAB. Gastrointestinal: Soft with mild tenderness to palpation in all four quadrants. No distention. No abdominal bruits. No  CVA tenderness. Genitourinary:  Musculoskeletal: No lower extremity tenderness nor edema.  No joint effusions. Neurologic:  Normal speech and language. No gross focal neurologic deficits are appreciated. No facial droop Skin:  Skin is warm, dry and intact. No rash noted. Psychiatric: Mood and affect are normal. Speech and behavior are normal.  ____________________________________________   LABS (all labs ordered are listed, but only abnormal results are displayed)  Results for orders placed or performed during the hospital encounter of 01/18/18 (from the past 24 hour(s))  Basic metabolic panel     Status: Abnormal   Collection Time: 01/18/18 10:07 AM  Result Value Ref Range   Sodium 139 135 - 145 mmol/L   Potassium 4.0 3.5 - 5.1 mmol/L   Chloride 105 98 - 111 mmol/L   CO2 28 22 - 32 mmol/L   Glucose, Bld 252 (H) 70 - 99 mg/dL   BUN 9 6 - 20 mg/dL   Creatinine, Ser 0.82 0.61 - 1.24 mg/dL   Calcium 9.2 8.9 - 10.3 mg/dL   GFR calc non Af Amer >60 >60 mL/min   GFR calc Af Amer >60  >60 mL/min   Anion gap 6 5 - 15  CBC     Status: Abnormal   Collection Time: 01/18/18 10:07 AM  Result Value Ref Range   WBC 7.7 3.8 - 10.6 K/uL   RBC 5.03 4.40 - 5.90 MIL/uL   Hemoglobin 14.8 13.0 - 18.0 g/dL   HCT 43.7 40.0 - 52.0 %   MCV 86.9 80.0 - 100.0 fL   MCH 29.4 26.0 - 34.0 pg   MCHC 33.9 32.0 - 36.0 g/dL   RDW 14.0 11.5 - 14.5 %   Platelets 116 (L) 150 - 440 K/uL  Urinalysis, Complete w Microscopic     Status: Abnormal   Collection Time: 01/18/18 10:07 AM  Result Value Ref Range   Color, Urine YELLOW (A) YELLOW   APPearance CLEAR (A) CLEAR   Specific Gravity, Urine 1.026 1.005 - 1.030   pH 5.0 5.0 - 8.0   Glucose, UA >=500 (A) NEGATIVE mg/dL   Hgb urine dipstick NEGATIVE NEGATIVE   Bilirubin Urine NEGATIVE NEGATIVE   Ketones, ur NEGATIVE NEGATIVE mg/dL   Protein, ur NEGATIVE NEGATIVE mg/dL   Nitrite NEGATIVE NEGATIVE   Leukocytes, UA NEGATIVE NEGATIVE   RBC / HPF 0-5 0 - 5 RBC/hpf   WBC, UA 0-5 0 - 5 WBC/hpf   Bacteria, UA NONE SEEN NONE SEEN   Squamous Epithelial / LPF NONE SEEN 0 - 5   Mucus PRESENT   Hepatic function panel     Status: Abnormal   Collection Time: 01/18/18 10:07 AM  Result Value Ref Range   Total Protein 7.1 6.5 - 8.1 g/dL   Albumin 4.0 3.5 - 5.0 g/dL   AST 41 15 - 41 U/L   ALT 78 (H) 0 - 44 U/L   Alkaline Phosphatase 58 38 - 126 U/L   Total Bilirubin 0.5 0.3 - 1.2 mg/dL   Bilirubin, Direct <0.1 0.0 - 0.2 mg/dL   Indirect Bilirubin NOT CALCULATED 0.3 - 0.9 mg/dL   ____________________________________________  EKG My review and personal interpretation at Time: 10:14   Indication: weakness  Rate: 90  Rhythm: sinus Axis: left Other: normal intervals, no stemi ____________________________________________  RADIOLOGY  I personally reviewed all radiographic images ordered to evaluate for the above acute complaints and reviewed radiology reports and findings.  These findings were personally discussed with the patient.  Please see medical  record  for radiology report.  ____________________________________________   PROCEDURES  Procedure(s) performed:  Procedures    Critical Care performed: no ____________________________________________   INITIAL IMPRESSION / ASSESSMENT AND PLAN / ED COURSE  Pertinent labs & imaging results that were available during my care of the patient were reviewed by me and considered in my medical decision making (see chart for details).   DDX: Enteritis, mass, diverticulitis, dehydration, UTI, stone, pneumonia, ascites, viral illness  KREW HORTMAN is a 57 y.o. who presents to the ED with symptoms as described above.  He is afebrile hemodynamically stable.  Does have some mild tenderness on exam therefore will order CT imaging.  Certainly no evidence of significant AKI or metabolic dyscrasia.  Does have some nasal congestion and productive cough.  Possible atypical pneumonia but no hypoxia.  The patient will be placed on continuous pulse oximetry and telemetry for monitoring.  Laboratory evaluation will be sent to evaluate for the above complaints.     Clinical Course as of Jan 19 1512  Sun Jan 18, 2018  1511 Patient CT abdomen is reassuring.  No evidence of diverticulitis.  At this point do believe he stable and appropriate for outpatient follow-up.  Tolerating oral hydration.  Patient was able to tolerate PO and was able to ambulate with a steady gait.    [PR]    Clinical Course User Index [PR] Merlyn Lot, MD     As part of my medical decision making, I reviewed the following data within the [facility] notes reviewed and incorporated, Labs reviewed, notes from prior ED visits.   ____________________________________________   FINAL CLINICAL IMPRESSION(S) / ED DIAGNOSES  Final diagnoses:  Generalized abdominal pain  Congestion of nasal sinus      NEW MEDICATIONS STARTED DURING THIS VISIT:  New Prescriptions   AZITHROMYCIN (ZITHROMAX Z-PAK)  250 MG TABLET    Take 2 tablets (500 mg) on  Day 1,  followed by 1 tablet (250 mg) once daily on Days 2 through 5.     Note:  This document was prepared using Dragon voice recognition software and may include unintentional dictation errors.    Merlyn Lot, MD 01/18/18 [phone]

## 2018-01-18 NOTE — Discharge Instructions (Addendum)

## 2018-01-18 NOTE — ED Triage Notes (Signed)
C/O being dehydrated for one week.  Denies N/V/D.  C/O sinus congestion, feeling weak all over.  Patient is AAOx3.  skinw arm and dry.  Ambulates with easy and steady gait.  NAD

## 2018-06-04 ENCOUNTER — Telehealth: Payer: Self-pay | Admitting: Cardiovascular Disease

## 2018-06-04 NOTE — Telephone Encounter (Signed)
No answer. Left message to call back.   

## 2018-06-04 NOTE — Telephone Encounter (Signed)
Pt c/o medication issue:  1. Name of Medication:  Cardiac Meds (isosorbide and metoprolol )   2. How are you currently taking this medication (dosage and times per day)? Not taking x 3-4 mths   3. Are you having a reaction (difficulty breathing--STAT)? No   4. What is your medication issue? Patient has been out of these meds for months and is unsure of which meds he is to be currently taking .  Scheduled appt 2/28 with Thurmond Butts but would like a call back to go over med list

## 2018-06-05 NOTE — Telephone Encounter (Signed)
I spoke with the patient. He has not been seen in our office since January 2018. He states he is out of his cardiac medications. I inquired if he was just out of refills. He states "I guess so." I advised him that since it has been 2 years since he has been seen and he has been out of his medications for 3-4 months, we should see him in the office to know what to appropriately place him back on.  I have offered an appointment on Monday 2/10 at 11:00 am with Christell Faith, PA and the patient is agreeable.

## 2018-06-05 NOTE — Progress Notes (Signed)
Cardiology Office Note Date:  06/08/2018  Patient ID:  Kyle [name], Kyle [name] [birth year], MRN [id number] PCP:  Marden Noble, MD  Cardiologist:  Dr. Rockey Situ, MD    Chief Complaint: Follow up  History of Present Illness: Kyle [name] is a 58 y.o. male with history of CAD s/p 4-vessel CABG in 10/2012, RBBB, poorly controlled diabetes, COPD with long history of ongoing tobacco abuse, ongoing alcohol abuse, hepatitis C, HTN, HLD, chronic back pain, and GERD who presents for follow up of his CAD.   Patient underwent 4-vessel CABG (LIMA-LAD, SVG-RI, SVG-OM1, SVG-rPDA (atretic RIMA)) in 10/2012 at the [facility] in [place], [place]. He was admitted in 08/2014 with chest pain and ruled out. Lexiscan Myoview was low risk with an EF of 45-54%. In follow up in 07/2015, he continued to note stable SOB with echo at that time showing an EF of 60-65%, mild concentric LVH, no RWMA, normal LV diastolic function, trivial AI, mildly dilated aortic root, mildly dilated ascending aorta measuring 3.9 cm, normal size left atrium, RVSF normal, PASP normal. He was last seen in our office for follow up of his CAD in 04/2016 and was noted to have run out of his medications at that time. He was also unclear on some of his medications (Lipitor vs simvastatin).   Labs: 12/2017 - K+ 4.0, SCr 0.82, ALT 78, WBC 7.7, HGB 14.8, PLT 116 11/2016 - A1c 11.8 04/2016 - LDL not calculated given TG of 443  Patient comes in doing reasonably well from a cardiac perspective.  Unfortunately, he has been out of Imdur, Toprol, and Crestor for 6 to 8 months.  He does report compliance with aspirin 81 mg daily as well as his Lantus and metformin.  He denies any chest pain.  No falls since he was last seen.  No BRBPR or melena.  No dizziness, presyncope, or syncope.  He does note some intermittent lower extremity edema.  He has been dealing with a URI for the past couple weeks and notes a dry cough with associated nasal congestion.  In this  setting, his appetite has been down though is starting to improve.  He has stable 2-3 pillow orthopnea.  No PND.  He continues to smoke approximately 2 packs of cigarettes per week and is interested in quitting.  His weight is down 21 pounds from his office visit in 04/2016 (180--> 159 pounds).  Past Medical History:  Diagnosis Date  . Allergy   . Asthma   . Chronic back pain 1981   a.followed by pain clinic and neurosurgery.  Marland Kitchen COPD (chronic obstructive pulmonary disease) ([place])   . Coronary artery disease involving native coronary artery    a. 10/2012 CABG x 4 @ [facility] (LIMA->LAD, VG->RI, VG->OM, VG->PDA);  b. 08/2014 Lexiscan MV: small defect of mild severity present along apical lateral & apex location, no st segment changes, LV function 45-54%, poor gated images lead to poor EF & wall motion assessment, low risk study.  . DDD (degenerative disc disease)   . DM2 (diabetes mellitus, type 2) ([place])   . Fainting spell   . GERD (gastroesophageal reflux disease)   . Hepatitis C    2/2 tattoo  . Hyperlipidemia with target LDL less than 70   . Hypertension   . Polysubstance abuse ([place])    a. ongoing daily tobacco abuse and etoh (on the weekends), remote crack/cocaine abuse last used approximately in 2006  . S/P CABG x 4    a.  LIMA-LAD, SVG-Ramus, SVG-OM,SVG-dRCA  . Urine incontinence     Past Surgical History:  Procedure Laterality Date  . CARDIAC CATHETERIZATION  11/2012   [facility] - Multivessel CAD  . CORONARY ARTERY BYPASS GRAFT  8/14   CABG x 4; LIMA-LAD, SVG-RI, SVG-OM1, SVG-rPDA (Atretic RIMA).  . neck injection      Current Meds  Medication Sig  . aspirin EC 81 MG tablet Take 1 tablet (81 mg total) by mouth daily.  . insulin glargine (LANTUS) 100 UNIT/ML injection Inject 30 Units into the skin 2 (two) times daily.  . isosorbide mononitrate (IMDUR) 30 MG 24 hr tablet Take 1 tablet (30 mg total) by mouth daily.  . metFORMIN (GLUCOPHAGE) 500 MG tablet Take 2  tablets (1,000 mg total) by mouth 2 (two) times daily with a meal.  . metoprolol succinate (TOPROL-XL) 25 MG 24 hr tablet Take 50 mg by mouth daily.  . nitroGLYCERIN (NITROSTAT) 0.4 MG SL tablet Place 1 tablet (0.4 mg total) under the tongue every 5 (five) minutes as needed for chest pain.  . rosuvastatin (CRESTOR) 10 MG tablet Take 10 mg by mouth at bedtime.   Current Facility-Administered Medications for the 06/08/18 encounter (Office Visit) with Rise Mu, PA-C  Medication  . bupivacaine (PF) (MARCAINE) 0.25 % injection 30 mL  . ciprofloxacin (CIPRO) IVPB 400 mg  . ciprofloxacin (CIPRO) IVPB 400 mg  . ciprofloxacin (CIPRO) tablet 250 mg  . fentaNYL (SUBLIMAZE) injection 100 mcg  . lactated ringers infusion 1,000 mL  . lactated ringers infusion 1,000 mL  . midazolam (VERSED) 5 MG/5ML injection 5 mg  . midazolam (VERSED) 5 MG/5ML injection 5 mg  . orphenadrine (NORFLEX) injection 60 mg  . orphenadrine (NORFLEX) injection 60 mg  . sodium chloride flush (NS) 0.9 % injection 20 mL  . triamcinolone acetonide (KENALOG-40) injection 40 mg    Allergies:   Amoxicillin; Flexeril [cyclobenzaprine]; Methadone; Tramadol; and Trazodone and nefazodone   Social History:  The patient  reports that he has been smoking cigarettes. He has a 8.75 pack-year smoking history. He has never used smokeless tobacco. He reports current alcohol use. He reports that he does not use drugs.   Family History:  The patient's family history includes Cancer in his sister; Diabetes in his sister and another family member; Heart disease in his father, mother, and another family member; Hypertension in his father.  ROS:   Review of Systems  Constitutional: Positive for chills, malaise/fatigue and weight loss. Negative for diaphoresis and fever.  HENT: Positive for congestion.   Eyes: Negative for discharge and redness.  Respiratory: Positive for cough and shortness of breath. Negative for hemoptysis, sputum production  and wheezing.   Cardiovascular: Positive for leg swelling. Negative for chest pain, palpitations, orthopnea, claudication and PND.  Gastrointestinal: Negative for abdominal pain, blood in stool, heartburn, melena, nausea and vomiting.  Genitourinary: Negative for hematuria.  Musculoskeletal: Negative for falls and myalgias.  Skin: Negative for rash.  Neurological: Positive for weakness. Negative for dizziness, tingling, tremors, sensory change, speech change, focal weakness and loss of consciousness.  Endo/Heme/Allergies: Does not bruise/bleed easily.  Psychiatric/Behavioral: Negative for substance abuse. The patient is not nervous/anxious.   All other systems reviewed and are negative.    PHYSICAL EXAM:  VS:  BP (!) 144/90 (BP Location: Left Arm, Patient Position: Sitting, Cuff Size: Normal)   Pulse (!) 105   Ht 5\' 7"  (1.702 m)   Wt 159 lb 4 oz (72.2 kg)   BMI 24.94  kg/m  BMI: Body mass index is 24.94 kg/m.  Physical Exam  Constitutional: He is oriented to person, place, and time. He appears well-developed and well-nourished.  HENT:  Head: Normocephalic and atraumatic.  Eyes: Right eye exhibits no discharge. Left eye exhibits no discharge.  Neck: Normal range of motion. No JVD present.  Cardiovascular: Normal rate, regular rhythm, S1 normal, S2 normal and normal heart sounds. Exam reveals no distant heart sounds, no friction rub, no midsystolic click and no opening snap.  No murmur heard. Pulses:      Posterior tibial pulses are 2+ on the right side and 2+ on the left side.  Pulmonary/Chest: Effort normal and breath sounds normal. No respiratory distress. He has no decreased breath sounds. He has no wheezes. He has no rales. He exhibits no tenderness.  Abdominal: Soft. He exhibits no distension. There is no abdominal tenderness.  Musculoskeletal:        General: No edema.     Comments: No lower extremity edema is noted on exam today.  Neurological: He is alert and oriented to  person, place, and time.  Skin: Skin is warm and dry. No cyanosis. Nails show no clubbing.  Psychiatric: He has a normal mood and affect. His speech is normal and behavior is normal. Judgment and thought content normal.     EKG:  Was ordered and interpreted by me today. Shows sinus tachycardia, 105 bpm, bifascicular block with RBBB and left anterior fascicular block, unchanged from prior study  Recent Labs: 01/18/2018: ALT 78; BUN 9; Creatinine, Ser 0.82; Hemoglobin 14.8; Platelets 116; Potassium 4.0; Sodium 139  No results found for requested labs within last 8760 hours.   CrCl cannot be calculated (Patient's most recent lab result is older than the maximum 21 days allowed.).   Wt Readings from Last 3 Encounters:  06/08/18 159 lb 4 oz (72.2 kg)  01/18/18 163 lb (73.9 kg)  04/28/17 170 lb (77.1 kg)     Other studies reviewed: Additional studies/records reviewed today include: summarized above  ASSESSMENT AND PLAN:  1. CAD involving the native coronary arteries status post CABG without angina: He is doing well without any symptoms concerning for angina.  He has been out of Imdur and Toprol for 6 to 8 months though reports compliance with aspirin 81 mg daily.  Check echocardiogram as outlined below.  Aggressive secondary prevention.    2. Dilated aortic root/ascending aorta: Noted on echo in 07/2015.  Repeat echocardiogram to trend.  Optimal blood pressure and lipid control is recommended.  3. Shortness of breath: Schedule echocardiogram.  Possibly in the setting of his URI versus COPD with ongoing tobacco abuse.  Check CBC.  4. Hypertension: Blood pressure is suboptimally controlled today however the patient is dealing with a URI and has been out of Imdur and Toprol.  Resume Imdur and Toprol as outlined above.  5. Hyperlipidemia: Check CMP, lipid panel, and direct LDL.  Goal LDL less than 70.  He has been off Crestor for approximately 6 to 8 months.  Refill Crestor 10 mg  daily.  6. COPD with ongoing tobacco abuse: Continues to smoke about 2 packs/week.  Has been unsuccessful with nicotine patches.  Cannot use nicotine gum secondary to dentition issues.  He is hesitant to pursue Chantix or Zyban given their side effects.  In this setting, we will pursue tapering with the goal of the patient smoking 1 pack of cigarettes per week in 6 months time.  Disposition: F/u with Dr. Rockey Situ or an  APP in 3 months.  Current medicines are reviewed at length with the patient today.  The patient did not have any concerns regarding medicines.  Signed, Christell Faith, PA-C 06/08/2018 11:28 AM     [facility] [address] [place] [place], [place] [address] [phone]

## 2018-06-08 ENCOUNTER — Encounter: Payer: Self-pay | Admitting: Physician Assistant

## 2018-06-08 ENCOUNTER — Ambulatory Visit (INDEPENDENT_AMBULATORY_CARE_PROVIDER_SITE_OTHER): Payer: Medicare PPO | Admitting: Physician Assistant

## 2018-06-08 VITALS — BP 144/90 | HR 105 | Ht 67.0 in | Wt 159.2 lb

## 2018-06-08 DIAGNOSIS — R0602 Shortness of breath: Secondary | ICD-10-CM

## 2018-06-08 DIAGNOSIS — Z720 Tobacco use: Secondary | ICD-10-CM

## 2018-06-08 DIAGNOSIS — I10 Essential (primary) hypertension: Secondary | ICD-10-CM

## 2018-06-08 DIAGNOSIS — I77810 Thoracic aortic ectasia: Secondary | ICD-10-CM | POA: Diagnosis not present

## 2018-06-08 DIAGNOSIS — Z951 Presence of aortocoronary bypass graft: Secondary | ICD-10-CM

## 2018-06-08 DIAGNOSIS — E785 Hyperlipidemia, unspecified: Secondary | ICD-10-CM

## 2018-06-08 DIAGNOSIS — I2510 Atherosclerotic heart disease of native coronary artery without angina pectoris: Secondary | ICD-10-CM

## 2018-06-08 DIAGNOSIS — J449 Chronic obstructive pulmonary disease, unspecified: Secondary | ICD-10-CM

## 2018-06-08 MED ORDER — METOPROLOL SUCCINATE ER 25 MG PO TB24: 50.0000 mg | ORAL_TABLET | Freq: Every day | ORAL | 1 refills | Status: DC

## 2018-06-08 MED ORDER — ROSUVASTATIN CALCIUM 10 MG PO TABS: 10.0000 mg | ORAL_TABLET | Freq: Every day | ORAL | 1 refills | Status: DC

## 2018-06-08 MED ORDER — ISOSORBIDE MONONITRATE ER 30 MG PO TB24: 30.0000 mg | ORAL_TABLET | Freq: Every day | ORAL | 1 refills | Status: DC

## 2018-06-08 NOTE — Patient Instructions (Signed)
Medication Instructions:  No changes If you need a refill on your cardiac medications before your next appointment, please call your pharmacy.   Lab work: Your provider would like for you to have the following labs today: CBC, CMET, Direct LDL, and lipid  If you have labs (blood work) drawn today and your tests are completely normal, you will receive your results only by: Marland Kitchen MyChart Message (if you have MyChart) OR . A paper copy in the mail If you have any lab test that is abnormal or we need to change your treatment, we will call you to review the results.  Testing/Procedures: Your physician has requested that you have an echocardiogram. Echocardiography is a painless test that uses sound waves to create images of your heart. It provides your doctor with information about the size and shape of your heart and how well your heart's chambers and valves are working. You may receive an ultrasound enhancing agent through an IV if needed to better visualize your heart during the echo.This procedure takes approximately one hour. There are no restrictions for this procedure. This will take place at the [facility] clinic.    Follow-Up: At [facility], you and your health needs are our priority.  As part of our continuing mission to provide you with exceptional heart care, we have created designated Provider Care Teams.  These Care Teams include your primary Cardiologist (physician) and Advanced Practice Providers (APPs -  Physician Assistants and Nurse Practitioners) who all work together to provide you with the care you need, when you need it. You will need a follow up appointment in 3 months.  Please call our office 2 months in advance to schedule this appointment.  You may see Dr. Rockey Situ or one of the following Advanced Practice Providers on your designated Care Team:   Murray Hodgkins, NP Christell Faith, PA-C . Marrianne Mood, PA-C

## 2018-06-09 LAB — LIPID PANEL
CHOL/HDL RATIO: 4.3 ratio (ref 0.0–5.0)
Cholesterol, Total: 156 mg/dL (ref 100–199)
HDL: 36 mg/dL — ABNORMAL LOW (ref >39)
LDL Calculated: 78 mg/dL (ref 0–99)
TRIGLYCERIDES: 212 mg/dL — AB (ref 0–149)
VLDL Cholesterol Cal: 42 mg/dL — ABNORMAL HIGH (ref 5–40)

## 2018-06-09 LAB — COMPREHENSIVE METABOLIC PANEL
ALT: 116 IU/L — ABNORMAL HIGH (ref 0–44)
AST: 67 IU/L — AB (ref 0–40)
Albumin/Globulin Ratio: 2 (ref 1.2–2.2)
Albumin: 4.4 g/dL (ref 3.8–4.9)
Alkaline Phosphatase: 70 IU/L (ref 39–117)
BILIRUBIN TOTAL: 0.5 mg/dL (ref 0.0–1.2)
BUN/Creatinine Ratio: 13 (ref 9–20)
BUN: 10 mg/dL (ref 6–24)
CALCIUM: 9.5 mg/dL (ref 8.7–10.2)
CHLORIDE: 97 mmol/L (ref 96–106)
CO2: 22 mmol/L (ref 20–29)
Creatinine, Ser: 0.75 mg/dL — ABNORMAL LOW (ref 0.76–1.27)
GFR, EST AFRICAN AMERICAN: 116 mL/min/{1.73_m2} (ref >59)
GFR, EST NON AFRICAN AMERICAN: 100 mL/min/{1.73_m2} (ref >59)
GLOBULIN, TOTAL: 2.2 g/dL (ref 1.5–4.5)
Glucose: 424 mg/dL — ABNORMAL HIGH (ref 65–99)
POTASSIUM: 4.4 mmol/L (ref 3.5–5.2)
Sodium: 132 mmol/L — ABNORMAL LOW (ref 134–144)
Total Protein: 6.6 g/dL (ref 6.0–8.5)

## 2018-06-09 LAB — LDL CHOLESTEROL, DIRECT: LDL DIRECT: 97 mg/dL (ref 0–99)

## 2018-06-09 LAB — CBC
HEMATOCRIT: 44.2 % (ref 37.5–51.0)
Hemoglobin: 14.3 g/dL (ref 13.0–17.7)
MCH: 27.4 pg (ref 26.6–33.0)
MCHC: 32.4 g/dL (ref 31.5–35.7)
MCV: 85 fL (ref 79–97)
Platelets: 127 10*3/uL — ABNORMAL LOW (ref 150–450)
RBC: 5.21 x10E6/uL (ref 4.14–5.80)
RDW: 12.6 % (ref 11.6–15.4)
WBC: 8.4 10*3/uL (ref 3.4–10.8)

## 2018-06-10 ENCOUNTER — Telehealth: Payer: Self-pay | Admitting: *Deleted

## 2018-06-10 DIAGNOSIS — Z720 Tobacco use: Secondary | ICD-10-CM

## 2018-06-10 DIAGNOSIS — E785 Hyperlipidemia, unspecified: Secondary | ICD-10-CM

## 2018-06-10 DIAGNOSIS — Z79899 Other long term (current) drug therapy: Secondary | ICD-10-CM

## 2018-06-10 NOTE — Telephone Encounter (Signed)
-----   Message from Rise Mu, PA-C sent at 06/09/2018  7:09 AM EST ----- Blood count normal. Platelet count mildly low, though this is not new for him. I recommend he follow up with his PCP for this if not already worked up.  Glucose is poorly controlled (known diabetes). He needs to follow up with his PCP regarding his poorly controlled diabetes.  Renal function is normal.  Corrected sodium for his hyperglycemia is 140. Potassium is at goal.  Liver function is elevated. He has known hepatitis C and alcohol abuse. I recommend he follow up with his PCP and a hepatologist if indicated by his PCP.  LDL is reasonably controlled given he reported being off Crestor for 6-8 months. Goal LDL is < 70. He needs a recheck fasting lipid panel and liver function in 8 weeks given we resumed Crestor at his visit on 06/08/2018.

## 2018-06-10 NOTE — Telephone Encounter (Signed)
Results called to pt. Pt verbalized understanding. He is aware to continue Crestor.  He is aware to follow up with PCP regarding platelet, liver and glucose. He is aware to be fasting for lipid and liver in 8 weeks.  Transferred to scheduler to arrange lab appointment.

## 2018-06-26 ENCOUNTER — Ambulatory Visit: Payer: Medicare PPO | Admitting: Physician Assistant

## 2018-07-03 ENCOUNTER — Other Ambulatory Visit: Payer: Medicare PPO

## 2018-07-28 ENCOUNTER — Other Ambulatory Visit: Payer: Medicare PPO

## 2018-07-31 ENCOUNTER — Telehealth: Payer: Self-pay | Admitting: Cardiovascular Disease

## 2018-07-31 DIAGNOSIS — E785 Hyperlipidemia, unspecified: Secondary | ICD-10-CM

## 2018-07-31 DIAGNOSIS — Z79899 Other long term (current) drug therapy: Secondary | ICD-10-CM

## 2018-07-31 NOTE — Telephone Encounter (Signed)
Please change order to [facility] mall.  Patient cancelled for clinic and is aware to go to [facility] for blood work.

## 2018-07-31 NOTE — Telephone Encounter (Signed)
Lab orders changed.  

## 2018-08-03 ENCOUNTER — Other Ambulatory Visit: Payer: Medicare PPO

## 2018-08-13 ENCOUNTER — Telehealth: Payer: Self-pay

## 2018-08-13 NOTE — Telephone Encounter (Signed)
Virtual Visit Pre-Appointment Phone Call  Steps For Call:  1. Confirm consent - "In the setting of the current Covid19 crisis, you are scheduled for a visit with your video on Sep 07, 2018 at 8:20AM.  Just as we do with many in-office visits, in order for you to participate in this visit, we must obtain consent.  If you'd like, I can send this to your mychart (if signed up) or email for you to review.  Otherwise, I can obtain your verbal consent now.  All virtual visits are billed to your insurance company just like a normal visit would be.  By agreeing to a virtual visit, we'd like you to understand that the technology does not allow for your provider to perform an examination, and thus may limit your provider's ability to fully assess your condition.  Finally, though the technology is pretty good, we cannot assure that it will always work on either your or our end, and in the setting of a video visit, we may have to convert it to a phone-only visit.  In either situation, we cannot ensure that we have a secure connection.  Are you willing to proceed?" STAFF: Did the patient verbally acknowledge consent to telehealth visit? Document YES/NO here: YES  2. Confirm the BEST phone number to call the day of the visit by including in appointment notes  3. Give patient instructions for WebEx/MyChart download to smartphone as below or Doximity/Doxy.me if video visit (depending on what platform provider is using)  4. Advise patient to be prepared with their blood pressure, heart rate, weight, any heart rhythm information, their current medicines, and a piece of paper and pen handy for any instructions they may receive the day of their visit  5. Inform patient they will receive a phone call 15 minutes prior to their appointment time (may be from unknown caller ID) so they should be prepared to answer  6. Confirm that appointment type is correct in Epic appointment notes (VIDEO vs PHONE)     TELEPHONE  CALL NOTE  Kyle [name] has been deemed a candidate for a follow-up tele-health visit to limit community exposure during the Covid-19 pandemic. I spoke with the patient via phone to ensure availability of phone/video source, confirm preferred email & phone number, and discuss instructions and expectations.  I reminded Kyle [name] to be prepared with any vital sign and/or heart rhythm information that could potentially be obtained via home monitoring, at the time of his visit. I reminded Kyle [name] to expect a phone call at the time of his visit if his visit.  Kyle [name] 08/13/2018 12:41 PM   INSTRUCTIONS FOR DOWNLOADING THE [place] APP TO SMARTPHONE  - If Apple, ask patient to go to [employer] and type in WebEx in the search bar. [place] [employer], the blue/green circle. If Android, go to [employer] and type in [employer] in the search bar. The app is free but as with any other app downloads, their phone may require them to verify saved payment information or Apple/Android password.  - The patient does NOT have to create an account. - On the day of the visit, the assist will walk the patient through joining the meeting with the meeting number/password.  INSTRUCTIONS FOR DOWNLOADING THE MYCHART APP TO SMARTPHONE  - The patient must first make sure to have activated MyChart and know their login information - If Apple, go to [employer] and type in  MyChart in the search bar and download the app. If Android, ask patient to go to [employer] and type in [place] in the search bar and download the app. The app is free but as with any other app downloads, their phone may require them to verify saved payment information or Apple/Android password.  - The patient will need to then log into the app with their MyChart username and password, and select [facility] as their healthcare provider to link the account. When it is time for your visit, go to the MyChart app,  find appointments, and click Begin Video Visit. Be sure to Select Allow for your device to access the Microphone and Camera for your visit. You will then be connected, and your provider will be with you shortly.  **If they have any issues connecting, or need assistance please contact MyChart service desk [phone])**  **If using a computer, in order to ensure the best quality for their visit they will need to use either of the following Internet Browsers: [employer], or Google Chrome**  IF USING DOXIMITY or DOXY.ME - The patient will receive a link just prior to their visit, either by text or email (to be determined day of appointment depending on if it's doxy.me or Doximity).     FULL LENGTH CONSENT FOR TELE-HEALTH VISIT   I hereby voluntarily request, consent and authorize [facility] and its employed or contracted physicians, physician assistants, nurse practitioners or other licensed health care professionals (the Practitioner), to provide me with telemedicine health care services (the "Services") as deemed necessary by the treating Practitioner. I acknowledge and consent to receive the Services by the Practitioner via telemedicine. I understand that the telemedicine visit will involve communicating with the Practitioner through live audiovisual communication technology and the disclosure of certain medical information by electronic transmission. I acknowledge that I have been given the opportunity to request an in-person assessment or other available alternative prior to the telemedicine visit and am voluntarily participating in the telemedicine visit.  I understand that I have the right to withhold or withdraw my consent to the use of telemedicine in the course of my care at any time, without affecting my right to future care or treatment, and that the Practitioner or I may terminate the telemedicine visit at any time. I understand that I have the right to inspect all  information obtained and/or recorded in the course of the telemedicine visit and may receive copies of available information for a reasonable fee.  I understand that some of the potential risks of receiving the Services via telemedicine include:  Marland Kitchen Delay or interruption in medical evaluation due to technological equipment failure or disruption; . Information transmitted may not be sufficient (e.g. poor resolution of images) to allow for appropriate medical decision making by the Practitioner; and/or  . In rare instances, security protocols could fail, causing a breach of personal health information.  Furthermore, I acknowledge that it is my responsibility to provide information about my medical history, conditions and care that is complete and accurate to the best of my ability. I acknowledge that Practitioner's advice, recommendations, and/or decision may be based on factors not within their control, such as incomplete or inaccurate data provided by me or distortions of diagnostic images or specimens that may result from electronic transmissions. I understand that the practice of medicine is not an exact science and that Practitioner makes no warranties or guarantees regarding treatment outcomes. I acknowledge that I will receive a copy  of this consent concurrently upon execution via email to the email address I last provided but may also request a printed copy by calling the office of [facility].    I understand that my insurance will be billed for this visit.   I have read or had this consent read to me. . I understand the contents of this consent, which adequately explains the benefits and risks of the Services being provided via telemedicine.  . I have been provided ample opportunity to ask questions regarding this consent and the Services and have had my questions answered to my satisfaction. . I give my informed consent for the services to be provided through the use of telemedicine in my  medical care  By participating in this telemedicine visit I agree to the above.

## 2018-08-13 NOTE — Telephone Encounter (Signed)
Call attempted to reschedule 09/07/2018 face to face visit with Dr. Rockey Situ to a video or telephone visit due to current clinic policies related to COVID 19 virus precautions. Unable to leave voicemail message-mailbox is full.

## 2018-08-28 ENCOUNTER — Telehealth: Payer: Self-pay

## 2018-08-28 ENCOUNTER — Telehealth: Payer: Self-pay | Admitting: Cardiovascular Disease

## 2018-08-28 NOTE — Telephone Encounter (Signed)
Follow Up:; ° ° °Returning your call. °

## 2018-08-28 NOTE — Telephone Encounter (Signed)
Called patient.  No answer. Vm was full.  Need to preform Doxy trial with patient.

## 2018-08-28 NOTE — Telephone Encounter (Signed)
Follow up:   Patient returning a call back.  

## 2018-08-28 NOTE — Telephone Encounter (Signed)
Tried to return patients call.  Someone answered then hung up.

## 2018-09-04 ENCOUNTER — Other Ambulatory Visit: Payer: Medicare PPO

## 2018-09-06 DIAGNOSIS — Z720 Tobacco use: Secondary | ICD-10-CM | POA: Insufficient documentation

## 2018-09-06 DIAGNOSIS — E118 Type 2 diabetes mellitus with unspecified complications: Secondary | ICD-10-CM | POA: Insufficient documentation

## 2018-09-06 DIAGNOSIS — I77810 Thoracic aortic ectasia: Secondary | ICD-10-CM | POA: Insufficient documentation

## 2018-09-06 NOTE — Progress Notes (Signed)
Virtual Visit via Video Note   This visit type was conducted due to national recommendations for restrictions regarding the COVID-19 Pandemic (e.g. social distancing) in an effort to limit this patient's exposure and mitigate transmission in our community.  Due to his co-morbid illnesses, this patient is at least at moderate risk for complications without adequate follow up.  This format is felt to be most appropriate for this patient at this time.  All issues noted in this document were discussed and addressed.  A limited physical exam was performed with this format.  Please refer to the patient's chart for his consent to telehealth for [facility].   I connected with  Cheron Every on 09/07/18 by a video enabled telemedicine application and verified that I am speaking with the correct person using two identifiers. I discussed the limitations of evaluation and management by telemedicine. The patient expressed understanding and agreed to proceed.   Evaluation Performed:  Follow-up visit  Date:  09/07/2018   ID:  Hope, Holst [birth year], MRN [id number]  Patient Location:  [place] [address]   Provider location:   [facility], [place] office  PCP:  Marden Noble, MD  Cardiologist:  Patsy Baltimore   Chief Complaint:  SOB   History of Present Illness:    Kyle [name] is a 58 y.o. male who presents via audio/video conferencing for a telehealth visit today.   The patient does not symptoms concerning for COVID-19 infection (fever, chills, cough, or new SHORTNESS OF BREATH).   Patient has a past medical history of poorly controlled diabetes, long history of smoking who continues to smoke,  hyperlipidemia,  coronary artery disease, bypass surgery.  4-vessel CABG (LIMA-LAD, SVG-RI, SVG-OM1, SVG-rPDA (atretic RIMA)) in 10/2012 at the [facility]  He presents for routine followup of his coronary artery disease   in the past has run out of  his medications  Last seen by myself January 2018  Seen by Thurmond Butts February 2020 out of Imdur, Toprol, and Crestor for 6 to 8 months  weight is down 21 pounds from his office visit in 04/2016 (180--> 159 pounds).  He does receive some of his medical care through the [facility] He would like [place] to send refills and through the [place]  He denies having any significant chest pain on exertion,  Having some shortness of breath, "sinus real bad" He continues to smoke, 1 pack per week  Review of lab work with him shows HBA1C 12.3 LDL 97 Glu 424, sodium 132 Total chol 156, LDL 54 Was off some of his medications February 2020  No regular exercise program  Other past medical history reviewed Previously had worsening shortness of breath, fatigue, slight chest pain prior to his  bypass surgery. He had four-vessel bypass in July 2014 at the [facility] in [place]. Hospital is called the [place].     Prior CV studies:   The following studies were reviewed today:   08/2014 with chest pain and ruled out. Lexiscan Myoview was low risk with an EF of 45-54%.   07/2015,  EF of 60-65%, mild concentric LVH, no RWMA, normal LV diastolic function, trivial AI, mildly dilated aortic root, mildly dilated ascending aorta measuring 3.9 cm, normal size left atrium, RVSF normal, PASP normal.   Past Medical History:  Diagnosis Date  . Allergy   . Asthma   . Chronic back pain 1981   a.followed by  pain clinic and neurosurgery.  Marland Kitchen COPD (chronic obstructive pulmonary disease) ([place])   . Coronary artery disease involving native coronary artery    a. 10/2012 CABG x 4 @ [facility] (LIMA->LAD, VG->RI, VG->OM, VG->PDA);  b. 08/2014 Lexiscan MV: small defect of mild severity present along apical lateral & apex location, no st segment changes, LV function 45-54%, poor gated images lead to poor EF & wall motion assessment, low risk study.  . DDD (degenerative disc disease)   . DM2 (diabetes mellitus, type 2)  ([place])   . Fainting spell   . GERD (gastroesophageal reflux disease)   . Hepatitis C    2/2 tattoo  . Hyperlipidemia with target LDL less than 70   . Hypertension   . Polysubstance abuse ([place])    a. ongoing daily tobacco abuse and etoh (on the weekends), remote crack/cocaine abuse last used approximately in 2006  . S/P CABG x 4    a. LIMA-LAD, SVG-Ramus, SVG-OM,SVG-dRCA  . Urine incontinence    Past Surgical History:  Procedure Laterality Date  . CARDIAC CATHETERIZATION  11/2012   [facility] - Multivessel CAD  . CORONARY ARTERY BYPASS GRAFT  8/14   CABG x 4; LIMA-LAD, SVG-RI, SVG-OM1, SVG-rPDA (Atretic RIMA).  . neck injection       Current Meds  Medication Sig  . aspirin EC 81 MG tablet Take 1 tablet (81 mg total) by mouth daily.  . insulin glargine (LANTUS) 100 UNIT/ML injection Inject 30 Units into the skin 2 (two) times daily.  . isosorbide mononitrate (IMDUR) 30 MG 24 hr tablet Take 1 tablet (30 mg total) by mouth daily.  . metFORMIN (GLUCOPHAGE) 500 MG tablet Take 2 tablets (1,000 mg total) by mouth 2 (two) times daily with a meal.  . metoprolol succinate (TOPROL-XL) 25 MG 24 hr tablet Take 2 tablets (50 mg total) by mouth daily.  . nitroGLYCERIN (NITROSTAT) 0.4 MG SL tablet Place 1 tablet (0.4 mg total) under the tongue every 5 (five) minutes as needed for chest pain.  . rosuvastatin (CRESTOR) 10 MG tablet Take 1 tablet (10 mg total) by mouth at bedtime.     Allergies:   Amoxicillin; Flexeril [cyclobenzaprine]; Methadone; Tramadol; and Trazodone and nefazodone   Social History   Tobacco Use  . Smoking status: Current Every Day Smoker    Packs/day: 0.25    Years: 35.00    Pack years: 8.75    Types: Cigarettes  . Smokeless tobacco: Never Used  . Tobacco comment: a pack lasts 2 to 4 days   Substance Use Topics  . Alcohol use: Yes    Alcohol/week: 0.0 standard drinks    Comment: 2-3 beers/week  . Drug use: No     Current Outpatient Medications on File  Prior to Visit  Medication Sig Dispense Refill  . aspirin EC 81 MG tablet Take 1 tablet (81 mg total) by mouth daily. 90 tablet 3  . insulin glargine (LANTUS) 100 UNIT/ML injection Inject 30 Units into the skin 2 (two) times daily.    . isosorbide mononitrate (IMDUR) 30 MG 24 hr tablet Take 1 tablet (30 mg total) by mouth daily. 90 tablet 1  . metFORMIN (GLUCOPHAGE) 500 MG tablet Take 2 tablets (1,000 mg total) by mouth 2 (two) times daily with a meal. 60 tablet 1  . metoprolol succinate (TOPROL-XL) 25 MG 24 hr tablet Take 2 tablets (50 mg total) by mouth daily. 180 tablet 1  . nitroGLYCERIN (NITROSTAT) 0.4 MG SL tablet Place 1 tablet (  0.4 mg total) under the tongue every 5 (five) minutes as needed for chest pain. 21 tablet 12  . rosuvastatin (CRESTOR) 10 MG tablet Take 1 tablet (10 mg total) by mouth at bedtime. 90 tablet 1   No current facility-administered medications on file prior to visit.      Family Hx: The patient's family history includes Cancer in his sister; Diabetes in his sister and another family member; Heart disease in his father, mother, and another family member; Hypertension in his father.  ROS:   Please see the history of present illness.    Review of Systems  Constitutional: Negative.   HENT: Positive for congestion.   Respiratory: Positive for shortness of breath.   Cardiovascular: Negative.   Gastrointestinal: Negative.   Musculoskeletal: Negative.   Neurological: Negative.   Psychiatric/Behavioral: Negative.   All other systems reviewed and are negative.     Labs/Other Tests and Data Reviewed:    Recent Labs: 06/08/2018: ALT 116; BUN 10; Creatinine, Ser 0.75; Hemoglobin 14.3; Platelets 127; Potassium 4.4; Sodium 132   Recent Lipid Panel Lab Results  Component Value Date/Time   CHOL 156 06/08/2018 11:32 AM   CHOL 131 01/24/2013 04:48 AM   TRIG 212 (H) 06/08/2018 11:32 AM   TRIG 184 01/24/2013 04:48 AM   HDL 36 (L) 06/08/2018 11:32 AM   HDL 24 (L)  01/24/2013 04:48 AM   CHOLHDL 4.3 06/08/2018 11:32 AM   CHOLHDL 4 11/24/2013 09:56 AM   LDLCALC 78 06/08/2018 11:32 AM   LDLCALC 70 01/24/2013 04:48 AM   LDLDIRECT 97 06/08/2018 11:32 AM   LDLDIRECT 74.1 11/24/2013 09:56 AM    Wt Readings from Last 3 Encounters:  06/08/18 159 lb 4 oz (72.2 kg)  01/18/18 163 lb (73.9 kg)  04/28/17 170 lb (77.1 kg)     Exam:    Vital Signs: Vital signs may also be detailed in the HPI There were no vitals taken for this visit.  Wt Readings from Last 3 Encounters:  06/08/18 159 lb 4 oz (72.2 kg)  01/18/18 163 lb (73.9 kg)  04/28/17 170 lb (77.1 kg)   Temp Readings from Last 3 Encounters:  01/18/18 98.4 F (36.9 C) (Oral)  04/28/17 98.5 F (36.9 C) (Oral)  10/25/16 97.9 F (36.6 C) (Oral)   BP Readings from Last 3 Encounters:  06/08/18 (!) 144/90  01/18/18 (!) 150/72  04/28/17 (!) 164/105   Pulse Readings from Last 3 Encounters:  06/08/18 (!) 105  01/18/18 65  04/28/17 84    150s over 70s, pulse 80, respirations 16  Well nourished, well developed male in no acute distress. Constitutional:  oriented to person, place, and time. No distress.  Head: Normocephalic and atraumatic.  Eyes:  no discharge. No scleral icterus.  Neck: Normal range of motion. Neck supple.  Pulmonary/Chest: No audible wheezing, no distress, appears comfortable Musculoskeletal: Normal range of motion.  no  tenderness or deformity.  Neurological:   Coordination normal. Full exam not performed Skin:  No rash Psychiatric:  normal mood and affect. behavior is normal. Thought content normal.    ASSESSMENT & PLAN:    Atherosclerosis of native coronary artery of native heart with stable angina pectoris ([place]) Stressed importance of medication compliance Currently with no symptoms of angina. No further workup at this time. Continue current medication regimen.  Dilated aortic root (HCC) We will need periodic ultrasound, will discuss with him in office follow-up   Ascending aorta dilatation ([place]) In follow-up will arrange repeat imaging  Diabetes  mellitus type 2 with complications (HCC) Markedly elevated secondary to noncompliance with his insulin Needs close follow-up with endocrine locally  Tobacco abuse Still smoking 1 pack/week Smoking cessation recommended  S/P CABG x 4  Mixed hyperlipidemia Stressed compliance with his Lipitor   COVID-19 Education: The signs and symptoms of COVID-19 were discussed with the patient and how to seek care for testing (follow up with PCP or arrange E-visit).  The importance of social distancing was discussed today.  Patient Risk:   After full review of this patients clinical status, I feel that they are at least moderate risk at this time.  Time:   Today, I have spent 25 minutes with the patient with telehealth technology discussing the cardiac and medical problems/diagnoses detailed above   10 min spent reviewing the chart prior to patient visit today   Medication Adjustments/Labs and Tests Ordered: Current medicines are reviewed at length with the patient today.  Concerns regarding medicines are outlined above.   Tests Ordered: No tests ordered   Medication Changes: No changes made   Disposition: Follow-up in 6 months   Signed, Ida Rogue, MD  09/07/2018 8:44 AM    [place] Office [address] [place] [address], [place], [place] [address]

## 2018-09-07 ENCOUNTER — Other Ambulatory Visit: Payer: Self-pay

## 2018-09-07 ENCOUNTER — Telehealth (INDEPENDENT_AMBULATORY_CARE_PROVIDER_SITE_OTHER): Payer: Medicare PPO | Admitting: Cardiovascular Disease

## 2018-09-07 DIAGNOSIS — Z951 Presence of aortocoronary bypass graft: Secondary | ICD-10-CM

## 2018-09-07 DIAGNOSIS — E118 Type 2 diabetes mellitus with unspecified complications: Secondary | ICD-10-CM | POA: Diagnosis not present

## 2018-09-07 DIAGNOSIS — I25118 Atherosclerotic heart disease of native coronary artery with other forms of angina pectoris: Secondary | ICD-10-CM

## 2018-09-07 DIAGNOSIS — E782 Mixed hyperlipidemia: Secondary | ICD-10-CM

## 2018-09-07 DIAGNOSIS — I77810 Thoracic aortic ectasia: Secondary | ICD-10-CM | POA: Diagnosis not present

## 2018-09-07 DIAGNOSIS — Z720 Tobacco use: Secondary | ICD-10-CM

## 2018-09-07 NOTE — Patient Instructions (Addendum)
Wants refills through the [place] Unclear if he means send it to the [facility]  He did not know  If we are unable to find out suggested he try to transition to [employer] good [email or web address] which might have cheaper prices  Would hold off on sending refills for now as he is going to check his blood pressure call us back If blood pressure is high we will increase Imdur up to 60 mg daily   Medication Instructions:  As above   If you need a refill on your cardiac medications before your next appointment, please call your pharmacy.    Lab work: No new labs needed   If you have labs (blood work) drawn today and your tests are completely normal, you will receive your results only by: Marland Kitchen MyChart Message (if you have MyChart) OR . A paper copy in the mail If you have any lab test that is abnormal or we need to change your treatment, we will call you to review the results.   Testing/Procedures: No new testing needed   Follow-Up: At [facility], you and your health needs are our priority.  As part of our continuing mission to provide you with exceptional heart care, we have created designated Provider Care Teams.  These Care Teams include your primary Cardiologist (physician) and Advanced Practice Providers (APPs -  Physician Assistants and Nurse Practitioners) who all work together to provide you with the care you need, when you need it.  . You will need a follow up appointment in 6 months .   Please call our office 2 months in advance to schedule this appointment.    . Providers on your designated Care Team:   . Murray Hodgkins, NP . Christell Faith, PA-C . Marrianne Mood, PA-C  Any Other Special Instructions Will Be Listed Below (If Applicable).  For educational health videos Log in to : [email or web address] Or : [email or web address], password : [email or web address]

## 2018-09-07 NOTE — Telephone Encounter (Signed)
Spoke with patient and reviewed medications listed and he confirmed upcoming appointment with provider today. Advised he should be calling him shortly.

## 2018-11-03 ENCOUNTER — Other Ambulatory Visit: Payer: Medicare PPO

## 2018-12-20 ENCOUNTER — Emergency Department: Payer: Medicare PPO

## 2018-12-20 ENCOUNTER — Encounter: Payer: Self-pay | Admitting: Emergency Medicine

## 2018-12-20 ENCOUNTER — Emergency Department
Admission: EM | Admit: 2018-12-20 | Discharge: 2018-12-20 | Disposition: A | Discharge status: Home / Self Care | Payer: Medicare PPO | Attending: Emergency Medicine | Admitting: Emergency Medicine

## 2018-12-20 DIAGNOSIS — R05 Cough: Secondary | ICD-10-CM

## 2018-12-20 DIAGNOSIS — I10 Essential (primary) hypertension: Secondary | ICD-10-CM | POA: Diagnosis not present

## 2018-12-20 DIAGNOSIS — Z951 Presence of aortocoronary bypass graft: Secondary | ICD-10-CM | POA: Insufficient documentation

## 2018-12-20 DIAGNOSIS — J449 Chronic obstructive pulmonary disease, unspecified: Secondary | ICD-10-CM | POA: Diagnosis not present

## 2018-12-20 DIAGNOSIS — Z7982 Long term (current) use of aspirin: Secondary | ICD-10-CM | POA: Diagnosis not present

## 2018-12-20 DIAGNOSIS — Z79899 Other long term (current) drug therapy: Secondary | ICD-10-CM | POA: Diagnosis not present

## 2018-12-20 DIAGNOSIS — F17210 Nicotine dependence, cigarettes, uncomplicated: Secondary | ICD-10-CM | POA: Diagnosis not present

## 2018-12-20 DIAGNOSIS — Z20828 Contact with and (suspected) exposure to other viral communicable diseases: Secondary | ICD-10-CM | POA: Insufficient documentation

## 2018-12-20 DIAGNOSIS — E119 Type 2 diabetes mellitus without complications: Secondary | ICD-10-CM | POA: Diagnosis not present

## 2018-12-20 DIAGNOSIS — Z794 Long term (current) use of insulin: Secondary | ICD-10-CM | POA: Diagnosis not present

## 2018-12-20 DIAGNOSIS — J45909 Unspecified asthma, uncomplicated: Secondary | ICD-10-CM | POA: Diagnosis not present

## 2018-12-20 DIAGNOSIS — R059 Cough, unspecified: Secondary | ICD-10-CM

## 2018-12-20 MED ORDER — CETIRIZINE HCL 10 MG PO CAPS: 10.0000 mg | ORAL_CAPSULE | Freq: Every day | ORAL | 3 refills | Status: DC

## 2018-12-20 MED ORDER — BENZONATATE 100 MG PO CAPS: 200.0000 mg | ORAL_CAPSULE | Freq: Three times a day (TID) | ORAL | 0 refills | Status: DC | PRN

## 2018-12-20 NOTE — ED Notes (Addendum)
Pt st symptoms started 2 weeks ago. C/o subjective fever at home, intermittent nausea, weakness. Intermittent SHOB. Pt denies COVID exposure.

## 2018-12-20 NOTE — ED Provider Notes (Signed)
[facility] Emergency Department Provider Note  ____________________________________________  Time seen: Approximately 5:40 PM  I have reviewed the triage vital signs and the nursing notes.   HISTORY  Chief Complaint Cough   HPI Kyle [name] is a 58 y.o. male who presents to the emergency department for treatment and evaluation of dry cough with nasal drainage for the past 2 weeks.  Subjective fever at home with intermittent nausea and shortness of breath.   He denies any known COVID-19 exposures.  No alleviating measures attempted prior to arrival.   Past Medical History:  Diagnosis Date  . Allergy   . Asthma   . Chronic back pain 1981   a.followed by pain clinic and neurosurgery.  Marland Kitchen COPD (chronic obstructive pulmonary disease) ([place])   . Coronary artery disease involving native coronary artery    a. 10/2012 CABG x 4 @ [facility] (LIMA->LAD, VG->RI, VG->OM, VG->PDA);  b. 08/2014 Lexiscan MV: small defect of mild severity present along apical lateral & apex location, no st segment changes, LV function 45-54%, poor gated images lead to poor EF & wall motion assessment, low risk study.  . DDD (degenerative disc disease)   . DM2 (diabetes mellitus, type 2) ([place])   . Fainting spell   . GERD (gastroesophageal reflux disease)   . Hepatitis C    2/2 tattoo  . Hyperlipidemia with target LDL less than 70   . Hypertension   . Polysubstance abuse ([place])    a. ongoing daily tobacco abuse and etoh (on the weekends), remote crack/cocaine abuse last used approximately in 2006  . S/P CABG x 4    a. LIMA-LAD, SVG-Ramus, SVG-OM,SVG-dRCA  . Urine incontinence     Patient Active Problem List   Diagnosis Date Noted  . Ascending aorta dilatation (HCC) 09/06/2018  . Diabetes mellitus type 2 with complications ([place]) 99991111  . Tobacco abuse 09/06/2018  . DDD (degenerative disc disease), lumbar 01/10/2015  . Facet syndrome, lumbar 01/10/2015  . Lumbar  radiculopathy 01/10/2015  . DDD (degenerative disc disease), cervical 11/22/2014  . Cervical facet syndrome 11/22/2014  . Bilateral occipital neuralgia 11/22/2014  . Hospital discharge follow-up 10/16/2014  . Atherosclerosis of native coronary artery with stable angina pectoris ([place])   . S/P CABG x 4   . Chest pain with moderate risk for cardiac etiology 09/27/2014  . Cervicalgia 07/05/2014  . Acute bacterial rhinosinusitis 06/15/2014  . Non compliance w medication regimen 06/07/2014  . Chest tightness 06/07/2014  . Tobacco abuse counseling 01/05/2014  . Vitamin D deficiency 01/05/2014  . Transaminitis 01/05/2014  . Temporary low platelet count ([place]) 01/05/2014  . Diabetes mellitus type 2, uncontrolled ([place]) 01/05/2014  . Back pain 10/01/2013  . Need for prophylactic vaccination with tetanus-diphtheria (Td) 10/01/2013  . Penile lesion 10/01/2013  . Essential hypertension 08/29/2013  . Hyperlipidemia with target LDL less than 70 08/29/2013  . Callus of foot 08/29/2013  . Blurred vision 08/29/2013  . Itching 08/29/2013  . Exacerbation of chronic back pain 06/07/2013  . Hyperlipidemia 03/02/2013  . Former smoker 03/02/2013  . Routine general medical examination at a health care facility 02/24/2013  . Hepatitis C 02/24/2013  . Diabetes type 2, uncontrolled ([place]) 02/24/2013    Past Surgical History:  Procedure Laterality Date  . CARDIAC CATHETERIZATION  11/2012   [facility] [place] - Multivessel CAD  . CORONARY ARTERY BYPASS GRAFT  8/14   CABG x 4; LIMA-LAD, SVG-RI, SVG-OM1, SVG-rPDA (Atretic RIMA).  . neck injection  Prior to Admission medications   Medication Sig Start Date End Date Taking? Authorizing Provider  aspirin EC 81 MG tablet Take 1 tablet (81 mg total) by mouth daily. 05/17/16   Minna Merritts, MD  benzonatate (TESSALON PERLES) 100 MG capsule Take 2 capsules (200 mg total) by mouth 3 (three) times daily as needed for cough. 12/20/18 12/20/19  Herrick Hartog, Johnette Abraham  B, FNP  Cetirizine HCl 10 MG CAPS Take 1 capsule (10 mg total) by mouth daily. 12/20/18   Shaquala Broeker B, FNP  insulin glargine (LANTUS) 100 UNIT/ML injection Inject 30 Units into the skin 2 (two) times daily.    [provider]  isosorbide mononitrate (IMDUR) 30 MG 24 hr tablet Take 1 tablet (30 mg total) by mouth daily. 06/08/18   Rise Mu, PA-C  metFORMIN (GLUCOPHAGE) 500 MG tablet Take 2 tablets (1,000 mg total) by mouth 2 (two) times daily with a meal. 10/25/16 09/07/18  Alfred Levins, [place], MD  metoprolol succinate (TOPROL-XL) 25 MG 24 hr tablet Take 2 tablets (50 mg total) by mouth daily. 06/08/18   Dunn, Areta Haber, PA-C  nitroGLYCERIN (NITROSTAT) 0.4 MG SL tablet Place 1 tablet (0.4 mg total) under the tongue [name] 5 (five) minutes as needed for chest pain. 09/29/14   Aldean Jewett, MD  rosuvastatin (CRESTOR) 10 MG tablet Take 1 tablet (10 mg total) by mouth at bedtime. 06/08/18   Rise Mu, PA-C    Allergies Amoxicillin, Flexeril [cyclobenzaprine], Methadone, Tramadol, and Trazodone and nefazodone  Family History  Problem Relation Age of Onset  . Heart disease Mother   . Heart disease Father   . Hypertension Father   . Diabetes Sister   . Cancer Sister        skin cancer  . Diabetes Other   . Heart disease Other     Social History Social History   Tobacco Use  . Smoking status: Current [name] Day Smoker    Packs/day: 0.25    Years: 35.00    Pack years: 8.75    Types: Cigarettes  . Smokeless tobacco: Never Used  . Tobacco comment: a pack lasts 2 to 4 days   Substance Use Topics  . Alcohol use: Yes    Alcohol/week: 0.0 standard drinks    Comment: 2-3 beers/week  . Drug use: No    Review of Systems Constitutional: Positive for fever/chills.  Normal appetite. ENT: Positive for sore throat. Cardiovascular: Denies chest pain. Respiratory: Intermittent shortness of breath.  Positive for cough.  Positive for intermittent wheezing.  Gastrointestinal: Negative  nausea, negative for vomiting.  No diarrhea.  Musculoskeletal: Negative for body aches Skin: Negative for rash. Neurological: Negative for headaches ____________________________________________   PHYSICAL EXAM:  VITAL SIGNS: ED Triage Vitals  Enc Vitals Group     BP 12/20/18 1618 (!) 144/100     Pulse Rate 12/20/18 1618 (!) 108     Resp 12/20/18 1618 18     Temp 12/20/18 1618 98.9 F (37.2 C)     Temp Source 12/20/18 1618 Oral     SpO2 12/20/18 1618 100 %     Weight 12/20/18 1619 170 lb (77.1 kg)     Height 12/20/18 1619 5\' 7"  (1.702 m)     Head Circumference --      Peak Flow --      Pain Score 12/20/18 1619 0     Pain Loc --      Pain Edu? --      Excl. in [place]? --  Constitutional: Alert and oriented.  Overall well appearing and in no acute distress. Eyes: Conjunctivae are normal. Ears: Tympanic membranes are normal Nose: No sinus congestion noted; no rhinnorhea. Mouth/Throat: Mucous membranes are moist.  Oropharynx mildly erythematous. Tonsils 1+ without exudate. Uvula midline. Neck: No stridor.  Lymphatic: No cervical lymphadenopathy. Cardiovascular: Normal rate, regular rhythm. Good peripheral circulation. Respiratory: Respirations are even and unlabored.  No retractions.  Breath sounds clear to auscultation.. Gastrointestinal: Soft and nontender.  Musculoskeletal: FROM x 4 extremities.  Neurologic:  Normal speech and language. Skin:  Skin is warm, dry and intact. No rash noted. Psychiatric: Mood and affect are normal. Speech and behavior are normal.  ____________________________________________   LABS (all labs ordered are listed, but only abnormal results are displayed)  Labs Reviewed  SARS CORONAVIRUS 2   ____________________________________________  EKG  Not indicated ____________________________________________  RADIOLOGY  Chest x-ray is negative for acute findings per  radiology. ____________________________________________   PROCEDURES  Procedure(s) performed: None  Critical Care performed: No ____________________________________________   INITIAL IMPRESSION / ASSESSMENT AND PLAN / ED COURSE  58 y.o. male discharge presenting to the emergency department for treatment and evaluation of cough, subjective fever, and sore throat.  Chest x-ray shows no acute findings.  Exam is reassuring.  Patient will be treated with cetirizine and Tessalon Perles.  Not believe that this is more of an allergy issue than COVID-19, however due to patient's comorbidities of diabetes, hepatitis C, and asthma he will be tested.  COVID instructions were provided regarding quarantine if negative and if positive.  He was advised to see primary care or return to emergency department for concerns.   Kyle [name] was evaluated in Emergency Department on 12/20/2018 for the symptoms described in the history of present illness. He was evaluated in the context of the global COVID-19 pandemic, which necessitated consideration that the patient might be at risk for infection with the SARS-CoV-2 virus that causes COVID-19. Institutional protocols and algorithms that pertain to the evaluation of patients at risk for COVID-19 are in a state of rapid change based on information released by regulatory bodies including the CDC and federal and state organizations. These policies and algorithms were followed during the patient's care in the ED.    Medications - No data to display  ED Discharge Orders         Ordered    Cetirizine HCl 10 MG CAPS  Daily,   Status:  Discontinued     12/20/18 1834    benzonatate (TESSALON PERLES) 100 MG capsule  3 times daily PRN     12/20/18 1834    Cetirizine HCl 10 MG CAPS  Daily     12/20/18 1835           Pertinent labs & imaging results that were available during my care of the patient were reviewed by me and considered in my medical decision making  (see chart for details).    If controlled substance prescribed during this visit, 12 month history viewed on the [place] prior to issuing an initial prescription for Schedule II or III opiod. ____________________________________________   FINAL CLINICAL IMPRESSION(S) / ED DIAGNOSES  Final diagnoses:  Cough    Note:  This document was prepared using Dragon voice recognition software and may include unintentional dictation errors.    Victorino Dike, FNP 12/20/18 Karin Golden    Duffy Bruce, MD 12/22/18 1022

## 2018-12-20 NOTE — Discharge Instructions (Addendum)
Please quarantine until your test results are back.  If negative, please stay home and away from others until your symptoms have resolved.  If positive, you will need to quarantine for 2 weeks.  Please follow-up with your primary care provider or the [facility] for symptoms of concern or if symptoms worsen.  Return to the emergency department if you are unable to schedule an appointment and you get worse.

## 2018-12-20 NOTE — ED Triage Notes (Signed)
Pt to ED with c/o of dry cough and nasal drainage that started approx 2 weeks ago.

## 2018-12-20 NOTE — ED Notes (Signed)
Patient denies pain and is resting comfortably.  

## 2018-12-21 LAB — SARS CORONAVIRUS 2 (TAT 6-24 HRS): SARS Coronavirus 2: NEGATIVE

## 2019-01-14 ENCOUNTER — Other Ambulatory Visit: Payer: Medicare PPO

## 2019-03-02 ENCOUNTER — Other Ambulatory Visit: Payer: Self-pay

## 2019-03-02 ENCOUNTER — Encounter: Payer: Self-pay | Admitting: Emergency Medicine

## 2019-03-02 ENCOUNTER — Emergency Department
Admission: EM | Admit: 2019-03-02 | Discharge: 2019-03-02 | Disposition: A | Discharge status: Home / Self Care | Payer: Medicare Other | Attending: Emergency Medicine | Admitting: Emergency Medicine

## 2019-03-02 ENCOUNTER — Emergency Department: Payer: Medicare Other

## 2019-03-02 DIAGNOSIS — Z888 Allergy status to other drugs, medicaments and biological substances status: Secondary | ICD-10-CM | POA: Diagnosis not present

## 2019-03-02 DIAGNOSIS — E782 Mixed hyperlipidemia: Secondary | ICD-10-CM | POA: Insufficient documentation

## 2019-03-02 DIAGNOSIS — J40 Bronchitis, not specified as acute or chronic: Secondary | ICD-10-CM | POA: Insufficient documentation

## 2019-03-02 DIAGNOSIS — E1142 Type 2 diabetes mellitus with diabetic polyneuropathy: Secondary | ICD-10-CM | POA: Diagnosis not present

## 2019-03-02 DIAGNOSIS — Z880 Allergy status to penicillin: Secondary | ICD-10-CM | POA: Insufficient documentation

## 2019-03-02 DIAGNOSIS — I10 Essential (primary) hypertension: Secondary | ICD-10-CM | POA: Insufficient documentation

## 2019-03-02 DIAGNOSIS — J449 Chronic obstructive pulmonary disease, unspecified: Secondary | ICD-10-CM | POA: Insufficient documentation

## 2019-03-02 DIAGNOSIS — F17210 Nicotine dependence, cigarettes, uncomplicated: Secondary | ICD-10-CM | POA: Insufficient documentation

## 2019-03-02 DIAGNOSIS — Z885 Allergy status to narcotic agent status: Secondary | ICD-10-CM | POA: Diagnosis not present

## 2019-03-02 DIAGNOSIS — Z20828 Contact with and (suspected) exposure to other viral communicable diseases: Secondary | ICD-10-CM | POA: Insufficient documentation

## 2019-03-02 DIAGNOSIS — R739 Hyperglycemia, unspecified: Secondary | ICD-10-CM

## 2019-03-02 DIAGNOSIS — Z794 Long term (current) use of insulin: Secondary | ICD-10-CM | POA: Diagnosis not present

## 2019-03-02 DIAGNOSIS — E1165 Type 2 diabetes mellitus with hyperglycemia: Secondary | ICD-10-CM | POA: Insufficient documentation

## 2019-03-02 DIAGNOSIS — M7918 Myalgia, other site: Secondary | ICD-10-CM | POA: Diagnosis present

## 2019-03-02 DIAGNOSIS — Z79899 Other long term (current) drug therapy: Secondary | ICD-10-CM | POA: Diagnosis not present

## 2019-03-02 DIAGNOSIS — Z951 Presence of aortocoronary bypass graft: Secondary | ICD-10-CM | POA: Diagnosis not present

## 2019-03-02 LAB — BASIC METABOLIC PANEL
Anion gap: 13 (ref 5–15)
BUN: 14 mg/dL (ref 6–20)
CO2: 25 mmol/L (ref 22–32)
Calcium: 9.6 mg/dL (ref 8.9–10.3)
Chloride: 95 mmol/L — ABNORMAL LOW (ref 98–111)
Creatinine, Ser: 0.79 mg/dL (ref 0.61–1.24)
GFR calc Af Amer: >60 mL/min (ref >60)
GFR calc non Af Amer: >60 mL/min (ref >60)
Glucose, Bld: 474 mg/dL — ABNORMAL HIGH (ref 70–99)
Potassium: 4.4 mmol/L (ref 3.5–5.1)
Sodium: 133 mmol/L — ABNORMAL LOW (ref 135–145)

## 2019-03-02 LAB — CBC
HCT: 45.8 % (ref 39.0–52.0)
Hemoglobin: 15 g/dL (ref 13.0–17.0)
MCH: 27.8 pg (ref 26.0–34.0)
MCHC: 32.8 g/dL (ref 30.0–36.0)
MCV: 84.8 fL (ref 80.0–100.0)
Platelets: 139 10*3/uL — ABNORMAL LOW (ref 150–400)
RBC: 5.4 MIL/uL (ref 4.22–5.81)
RDW: 12.2 % (ref 11.5–15.5)
WBC: 8.9 10*3/uL (ref 4.0–10.5)
nRBC: 0 % (ref 0.0–0.2)

## 2019-03-02 LAB — TROPONIN I (HIGH SENSITIVITY)
Troponin I (High Sensitivity): 6 ng/L (ref <18)
Troponin I (High Sensitivity): 6 ng/L (ref <18)

## 2019-03-02 LAB — PROTIME-INR
INR: 0.9 (ref 0.8–1.2)
Prothrombin Time: 12.5 seconds (ref 11.4–15.2)

## 2019-03-02 MED ORDER — METFORMIN HCL 1000 MG PO TABS: 1000.0000 mg | ORAL_TABLET | Freq: Two times a day (BID) | ORAL | 1 refills | Status: DC

## 2019-03-02 MED ORDER — LACTATED RINGERS IV BOLUS
1000.0000 mL | Freq: Once | INTRAVENOUS | Status: AC
  Administered 2019-03-02: 18:00:00 1000 mL via INTRAVENOUS

## 2019-03-02 MED ORDER — SODIUM CHLORIDE 0.9% FLUSH: 3.0000 mL | Freq: Once | INTRAVENOUS | Status: DC

## 2019-03-02 NOTE — ED Notes (Signed)
Pt c/o generalized body pain, states he has neuropathy that is worst in his hands and feet. Pt states he used to take medicine for it that helped but he was unable to refill the prescription so hasn't taken this medicine for a while. Pt also states over the past few weeks he hasn't been able to lay flat without getting short of breath.

## 2019-03-02 NOTE — ED Provider Notes (Signed)
[facility] Emergency Department Provider Note   ____________________________________________   First MD Initiated Contact with Patient 03/02/19 1802     (approximate)  I have reviewed the triage vital signs and the nursing notes.   HISTORY  Chief Complaint Generalized Body Aches    HPI Kyle [name] is a 58 y.o. male with past medical history of CAD status post CABG, diabetes, COPD, and hep C who presents to the ED complaining of generalized body aches.  Patient reports he has had a few weeks of generalized body aches as well as dry cough and shortness of breath.  He states he has been feeling hot with chills at times, but has not taken his temperature.  He is not aware of any sick contacts.  He does state that he had a negative test for Covid approximately 3 weeks ago.  He complains of some mild burning pain in the center of his chest that has also been going on for 3 weeks and is not associated with exertion.  He describes some burning pain in his lower extremities, going on for the past 6 months but worsening.  He admits that he has not taken his Metformin in the past few weeks because he ran out.        Past Medical History:  Diagnosis Date  . Allergy   . Asthma   . Chronic back pain 1981   a.followed by pain clinic and neurosurgery.  Marland Kitchen COPD (chronic obstructive pulmonary disease) ([place])   . Coronary artery disease involving native coronary artery    a. 10/2012 CABG x 4 @ [facility] (LIMA->LAD, VG->RI, VG->OM, VG->PDA);  b. 08/2014 Lexiscan MV: small defect of mild severity present along apical lateral & apex location, no st segment changes, LV function 45-54%, poor gated images lead to poor EF & wall motion assessment, low risk study.  . DDD (degenerative disc disease)   . DM2 (diabetes mellitus, type 2) ([place])   . Fainting spell   . GERD (gastroesophageal reflux disease)   . Hepatitis C    2/2 tattoo  . Hyperlipidemia with target LDL less than  70   . Hypertension   . Polysubstance abuse ([place])    a. ongoing daily tobacco abuse and etoh (on the weekends), remote crack/cocaine abuse last used approximately in 2006  . S/P CABG x 4    a. LIMA-LAD, SVG-Ramus, SVG-OM,SVG-dRCA  . Urine incontinence     Patient Active Problem List   Diagnosis Date Noted  . Ascending aorta dilatation (HCC) 09/06/2018  . Diabetes mellitus type 2 with complications ([place]) 99991111  . Tobacco abuse 09/06/2018  . DDD (degenerative disc disease), lumbar 01/10/2015  . Facet syndrome, lumbar 01/10/2015  . Lumbar radiculopathy 01/10/2015  . DDD (degenerative disc disease), cervical 11/22/2014  . Cervical facet syndrome 11/22/2014  . Bilateral occipital neuralgia 11/22/2014  . Hospital discharge follow-up 10/16/2014  . Atherosclerosis of native coronary artery with stable angina pectoris ([place])   . S/P CABG x 4   . Chest pain with moderate risk for cardiac etiology 09/27/2014  . Cervicalgia 07/05/2014  . Acute bacterial rhinosinusitis 06/15/2014  . Non compliance w medication regimen 06/07/2014  . Chest tightness 06/07/2014  . Tobacco abuse counseling 01/05/2014  . Vitamin D deficiency 01/05/2014  . Transaminitis 01/05/2014  . Temporary low platelet count ([place]) 01/05/2014  . Diabetes mellitus type 2, uncontrolled ([place]) 01/05/2014  . Back pain 10/01/2013  . Need for prophylactic vaccination with tetanus-diphtheria (Td) 10/01/2013  .  Penile lesion 10/01/2013  . Essential hypertension 08/29/2013  . Hyperlipidemia with target LDL less than 70 08/29/2013  . Callus of foot 08/29/2013  . Blurred vision 08/29/2013  . Itching 08/29/2013  . Exacerbation of chronic back pain 06/07/2013  . Hyperlipidemia 03/02/2013  . Former smoker 03/02/2013  . Routine general medical examination at a health care facility 02/24/2013  . Hepatitis C 02/24/2013  . Diabetes type 2, uncontrolled ([place]) 02/24/2013    Past Surgical History:  Procedure Laterality Date  .  CARDIAC CATHETERIZATION  11/2012   [name] - Multivessel CAD  . CORONARY ARTERY BYPASS GRAFT  8/14   CABG x 4; LIMA-LAD, SVG-RI, SVG-OM1, SVG-rPDA (Atretic RIMA).  . neck injection      Prior to Admission medications   Medication Sig Start Date End Date Taking? Authorizing Provider  aspirin EC 81 MG tablet Take 1 tablet (81 mg total) by mouth daily. 05/17/16   Minna Merritts, MD  benzonatate (TESSALON PERLES) 100 MG capsule Take 2 capsules (200 mg total) by mouth 3 (three) times daily as needed for cough. 12/20/18 12/20/19  Triplett, Johnette Abraham B, FNP  Cetirizine HCl 10 MG CAPS Take 1 capsule (10 mg total) by mouth daily. 12/20/18   Triplett, Cari B, FNP  insulin glargine (LANTUS) 100 UNIT/ML injection Inject 30 Units into the skin 2 (two) times daily.    [provider]  isosorbide mononitrate (IMDUR) 30 MG 24 hr tablet Take 1 tablet (30 mg total) by mouth daily. 06/08/18   Rise Mu, PA-C  metFORMIN (GLUCOPHAGE) 1000 MG tablet Take 1 tablet (1,000 mg total) by mouth 2 (two) times daily with a meal. 03/02/19 05/01/19  Blake Divine, MD  metoprolol succinate (TOPROL-XL) 25 MG 24 hr tablet Take 2 tablets (50 mg total) by mouth daily. 06/08/18   Dunn, Areta Haber, PA-C  nitroGLYCERIN (NITROSTAT) 0.4 MG SL tablet Place 1 tablet (0.4 mg total) under the tongue every 5 (five) minutes as needed for chest pain. 09/29/14   Aldean Jewett, MD  rosuvastatin (CRESTOR) 10 MG tablet Take 1 tablet (10 mg total) by mouth at bedtime. 06/08/18   Rise Mu, PA-C    Allergies Amoxicillin, Flexeril [cyclobenzaprine], Methadone, Tramadol, and Trazodone and nefazodone  Family History  Problem Relation Age of Onset  . Heart disease Mother   . Heart disease Father   . Hypertension Father   . Diabetes Sister   . Cancer Sister        skin cancer  . Diabetes Other   . Heart disease Other     Social History Social History   Tobacco Use  . Smoking status: Current Every Day Smoker     Packs/day: 0.25    Years: 35.00    Pack years: 8.75    Types: Cigarettes  . Smokeless tobacco: Never Used  . Tobacco comment: a pack lasts 2 to 4 days   Substance Use Topics  . Alcohol use: Yes    Alcohol/week: 0.0 standard drinks    Comment: 2-3 beers/week  . Drug use: No    Review of Systems  Constitutional: Positive for subjective fevers and chills. Eyes: No visual changes. ENT: No sore throat. Cardiovascular: Positive for chest pain. Respiratory: Positive for cough and shortness of breath. Gastrointestinal: No abdominal pain.  No nausea, no vomiting.  No diarrhea.  No constipation. Genitourinary: Negative for dysuria. Musculoskeletal: Negative for back pain.  Positive for burning lower extremity pain. Skin: Negative for rash. Neurological: Negative for headaches,  focal weakness or numbness.  ____________________________________________   PHYSICAL EXAM:  VITAL SIGNS: ED Triage Vitals  Enc Vitals Group     BP 03/02/19 1429 (!) 139/104     Pulse Rate 03/02/19 1429 (!) 113     Resp 03/02/19 1429 16     Temp 03/02/19 1429 98.1 F (36.7 C)     Temp Source 03/02/19 1429 Oral     SpO2 03/02/19 1429 100 %     Weight 03/02/19 1430 160 lb (72.6 kg)     Height 03/02/19 1430 5\' 7"  (1.702 m)     Head Circumference --      Peak Flow --      Pain Score 03/02/19 1429 9     Pain Loc --      Pain Edu? --      Excl. in [place]? --     Constitutional: Alert and oriented. Eyes: Conjunctivae are normal. Head: Atraumatic. Nose: No congestion/rhinnorhea. Mouth/Throat: Mucous membranes are moist. Neck: Normal ROM Cardiovascular: Tachycardic, regular rhythm. Grossly normal heart sounds. Respiratory: Normal respiratory effort.  No retractions. Lungs CTAB. Gastrointestinal: Soft and nontender. No distention. Genitourinary: deferred Musculoskeletal: No lower extremity tenderness nor edema.  2+ DP pulses bilaterally, no foot wounds or ulcerations. Neurologic:  Normal speech and  language. No gross focal neurologic deficits are appreciated. Skin:  Skin is warm, dry and intact. No rash noted. Psychiatric: Mood and affect are normal. Speech and behavior are normal.  ____________________________________________   LABS (all labs ordered are listed, but only abnormal results are displayed)  Labs Reviewed  BASIC METABOLIC PANEL - Abnormal; Notable for the following components:      Result Value   Sodium 133 (*)    Chloride 95 (*)    Glucose, Bld 474 (*)    All other components within normal limits  CBC - Abnormal; Notable for the following components:   Platelets 139 (*)    All other components within normal limits  NOVEL CORONAVIRUS, NAA (HOSP ORDER, SEND-OUT TO REF LAB; TAT 18-24 HRS)  PROTIME-INR  TROPONIN I (HIGH SENSITIVITY)  TROPONIN I (HIGH SENSITIVITY)   ____________________________________________  EKG  ED ECG REPORT I, Blake Divine, the attending physician, personally viewed and interpreted this ECG.   Date: 03/02/2019  EKG Time: 14:35  Rate: 116  Rhythm: sinus tachycardia  Axis: Normal  Intervals:right bundle branch block and left anterior fascicular block  ST&T Change: None   PROCEDURES  Procedure(s) performed (including Critical Care):  Procedures   ____________________________________________   INITIAL IMPRESSION / ASSESSMENT AND PLAN / ED COURSE       58 year old male with history of CAD status post CABG and diabetes, currently off medication, presents to the ED complaining of primarily diffuse muscle aches as well as burning pain in his lower extremities.  He also reports cough and shortness of breath over the past 3 weeks with some burning pain in the center of his chest.  He is not in any respiratory distress and is maintaining O2 sats on room air.  His chest x-ray is negative for acute process, however given his description of symptoms there is concern for COVID-19 and will perform testing.  Do not suspect ACS as his EKG  has no acute ischemic changes and 2 sets troponin are negative.  Do not suspect PE.  No evidence of bacterial pneumonia on chest x-ray.  His lower extremity symptoms appear related to diabetic neuropathy, he has no evidence of arterial insufficiency or wounds.  He has a  long history of noncompliance with his diabetic regimen and is hyperglycemic here with no evidence of DKA.  He is open to restarting Metformin, will hydrate with IV fluids for now and provide with prescription.  Counseled patient to follow-up with his PCP at the [place] and return to the ED for new or worsening symptoms.  Patient agrees with plan.      ____________________________________________   FINAL CLINICAL IMPRESSION(S) / ED DIAGNOSES  Final diagnoses:  Hyperglycemia  Bronchitis  Diabetic polyneuropathy associated with type 2 diabetes mellitus [facility])     ED Discharge Orders         Ordered    metFORMIN (GLUCOPHAGE) 1000 MG tablet  2 times daily with meals     03/02/19 1953           Note:  This document was prepared using Dragon voice recognition software and may include unintentional dictation errors.   Blake Divine, MD 03/02/19 2004

## 2019-03-02 NOTE — ED Notes (Signed)
Sent rainbow to lab. 

## 2019-03-02 NOTE — ED Triage Notes (Signed)
Pt to ED from home c/o generalized body aches and non productive cough x1 month, also states hx of neuropathy and has pain to bilat hands and legs.  Denies fevers.  States some SOB and slight chest pain.  Chest rise even and unlabored, skin WNL, A&Ox4, in NAD at this time.

## 2019-03-04 LAB — NOVEL CORONAVIRUS, NAA (HOSP ORDER, SEND-OUT TO REF LAB; TAT 18-24 HRS): SARS-CoV-2, NAA: NOT DETECTED

## 2019-03-12 ENCOUNTER — Ambulatory Visit (INDEPENDENT_AMBULATORY_CARE_PROVIDER_SITE_OTHER): Payer: Medicare Other

## 2019-03-12 ENCOUNTER — Other Ambulatory Visit: Payer: Self-pay

## 2019-03-12 DIAGNOSIS — R0602 Shortness of breath: Secondary | ICD-10-CM | POA: Diagnosis not present

## 2019-03-13 NOTE — Progress Notes (Addendum)
Evaluation Performed:  Follow-up visit  Date:  03/16/2019   ID:  Kyle [name], Kyle [name] [birth year], MRN [id number]  Patient Location:  [place] [address]   Provider location:   [facility], [place] office  PCP:  Marden Noble, MD  Cardiologist:  Arvid Right [facility]   Chief Complaint  Patient presents with   other    6 month follow up. Meds reviewed by the pt. verbally. Pt. c/o chest pain and shortness of breath.      History of Present Illness:    Kyle [name] is a 58 y.o. male   Patient has a past medical history of poorly controlled diabetes, long history of smoking who continues to smoke,  hyperlipidemia,  coronary artery disease, bypass surgery.  4-vessel CABG (LIMA-LAD, SVG-RI, SVG-OM1, SVG-rPDA (atretic RIMA)) in 10/2012 at the [facility]  He presents for routine followup of his coronary artery disease   in the past has run out of his medications  Not sleeping well, 2 hours at a time, "body wakes up" Tired,   Reports having occasional chest tightness He continues to smoke, 1 pack per week  Gets meds from the [place]  In the ER 02/2019,.  Details reviewed with him Generalized Body Aches  mild burning pain in the center of his chest that has also been going on for 3 weeks  -burning pain in his lower extremities, Metformin in the past few weeks because he ran out.  EKG personally reviewed by myself on todays visit Shows normal sinus rhythm/sinus tachycardia rate 103 bpm right bundle branch block left anterior fascicular block  Seen by Thurmond Butts February 2020 out of Imdur, Toprol, and Crestor for 6 to 8 months  weight is down 21 pounds from his office visit in 04/2016 (180--> 159 pounds).  Previous HBA1C 12.3  Other past medical history reviewed Previously had worsening shortness of breath, fatigue, slight chest pain prior to his  bypass surgery. He had four-vessel bypass in July 2014 at the [facility] in [place]. Hospital is  called the [facility].     Prior CV studies:   The following studies were reviewed today:   08/2014 with chest pain and ruled out. Lexiscan Myoview was low risk with an EF of 45-54%.   07/2015,  EF of 60-65%, mild concentric LVH, no RWMA, normal LV diastolic function, trivial AI, mildly dilated aortic root, mildly dilated ascending aorta measuring 3.9 cm, normal size left atrium, RVSF normal, PASP normal.   Past Medical History:  Diagnosis Date   Allergy    Asthma    Chronic back pain 1981   a.followed by pain clinic and neurosurgery.   COPD (chronic obstructive pulmonary disease) (HCC)    Coronary artery disease involving native coronary artery    a. 10/2012 CABG x 4 @ [facility] (LIMA->LAD, VG->RI, VG->OM, VG->PDA);  b. 08/2014 Lexiscan MV: small defect of mild severity present along apical lateral & apex location, no st segment changes, LV function 45-54%, poor gated images lead to poor EF & wall motion assessment, low risk study.   DDD (degenerative disc disease)    DM2 (diabetes mellitus, type 2) ([place])    Fainting spell    GERD (gastroesophageal reflux disease)    Hepatitis C    2/2 tattoo   Hyperlipidemia with target LDL less than 70    Hypertension    Polysubstance abuse ([place])    a. ongoing daily tobacco abuse  and etoh (on the weekends), remote crack/cocaine abuse last used approximately in 2006   S/P CABG x 4    a. LIMA-LAD, SVG-Ramus, SVG-OM,SVG-dRCA   Urine incontinence    Past Surgical History:  Procedure Laterality Date   CARDIAC CATHETERIZATION  11/2012   [facility] - Multivessel CAD   CORONARY ARTERY BYPASS GRAFT  8/14   CABG x 4; LIMA-LAD, SVG-RI, SVG-OM1, SVG-rPDA (Atretic RIMA).   neck injection       Current Meds  Medication Sig   aspirin EC 81 MG tablet Take 1 tablet (81 mg total) by mouth daily.   benzonatate (TESSALON PERLES) 100 MG capsule Take 2 capsules (200 mg total) by mouth 3 (three) times daily as needed  for cough.   Cetirizine HCl 10 MG CAPS Take 1 capsule (10 mg total) by mouth daily.   insulin glargine (LANTUS) 100 UNIT/ML injection Inject 30 Units into the skin 2 (two) times daily.   isosorbide mononitrate (IMDUR) 30 MG 24 hr tablet Take 1 tablet (30 mg total) by mouth daily.   metFORMIN (GLUCOPHAGE) 1000 MG tablet Take 1 tablet (1,000 mg total) by mouth 2 (two) times daily with a meal.   metoprolol succinate (TOPROL-XL) 25 MG 24 hr tablet Take 2 tablets (50 mg total) by mouth daily.   nitroGLYCERIN (NITROSTAT) 0.4 MG SL tablet Place 1 tablet (0.4 mg total) under the tongue every 5 (five) minutes as needed for chest pain.   rosuvastatin (CRESTOR) 10 MG tablet Take 1 tablet (10 mg total) by mouth at bedtime.     Allergies:   Trazodone and nefazodone, Amoxicillin, Cyclobenzaprine, Methadone, Penicillin g, Propoxyphene, Tramadol, and Trazodone   Social History   Tobacco Use   Smoking status: Current Every Day Smoker    Packs/day: 0.25    Years: 35.00    Pack years: 8.75    Types: Cigarettes   Smokeless tobacco: Never Used   Tobacco comment: a pack lasts 2 to 4 days   Substance Use Topics   Alcohol use: Yes    Alcohol/week: 0.0 standard drinks    Comment: 2-3 beers/week   Drug use: No     Current Outpatient Medications on File Prior to Visit  Medication Sig Dispense Refill   aspirin EC 81 MG tablet Take 1 tablet (81 mg total) by mouth daily. 90 tablet 3   benzonatate (TESSALON PERLES) 100 MG capsule Take 2 capsules (200 mg total) by mouth 3 (three) times daily as needed for cough. 30 capsule 0   Cetirizine HCl 10 MG CAPS Take 1 capsule (10 mg total) by mouth daily. 30 capsule 3   insulin glargine (LANTUS) 100 UNIT/ML injection Inject 30 Units into the skin 2 (two) times daily.     isosorbide mononitrate (IMDUR) 30 MG 24 hr tablet Take 1 tablet (30 mg total) by mouth daily. 90 tablet 1   metFORMIN (GLUCOPHAGE) 1000 MG tablet Take 1 tablet (1,000 mg total) by mouth  2 (two) times daily with a meal. 60 tablet 1   metoprolol succinate (TOPROL-XL) 25 MG 24 hr tablet Take 2 tablets (50 mg total) by mouth daily. 180 tablet 1   nitroGLYCERIN (NITROSTAT) 0.4 MG SL tablet Place 1 tablet (0.4 mg total) under the tongue every 5 (five) minutes as needed for chest pain. 21 tablet 12   rosuvastatin (CRESTOR) 10 MG tablet Take 1 tablet (10 mg total) by mouth at bedtime. 90 tablet 1   No current facility-administered medications on file prior to visit.  Family Hx: The patient's family history includes Cancer in his sister; Diabetes in his sister and another family member; Heart disease in his father, mother, and another family member; Hypertension in his father.  ROS:   Please see the history of present illness.    Review of Systems  Constitutional: Negative.   HENT: Negative.   Respiratory: Positive for shortness of breath.   Cardiovascular: Positive for chest pain.  Gastrointestinal: Negative.   Musculoskeletal: Negative.   Neurological: Negative.   Psychiatric/Behavioral: Negative.   All other systems reviewed and are negative.     Labs/Other Tests and Data Reviewed:    Recent Labs: 06/08/2018: ALT 116 03/02/2019: BUN 14; Creatinine, Ser 0.79; Hemoglobin 15.0; Platelets 139; Potassium 4.4; Sodium 133   Recent Lipid Panel Lab Results  Component Value Date/Time   CHOL 156 06/08/2018 11:32 AM   CHOL 131 01/24/2013 04:48 AM   TRIG 212 (H) 06/08/2018 11:32 AM   TRIG 184 01/24/2013 04:48 AM   HDL 36 (L) 06/08/2018 11:32 AM   HDL 24 (L) 01/24/2013 04:48 AM   CHOLHDL 4.3 06/08/2018 11:32 AM   CHOLHDL 4 11/24/2013 09:56 AM   LDLCALC 78 06/08/2018 11:32 AM   LDLCALC 70 01/24/2013 04:48 AM   LDLDIRECT 97 06/08/2018 11:32 AM   LDLDIRECT 74.1 11/24/2013 09:56 AM    Wt Readings from Last 3 Encounters:  03/16/19 154 lb 8 oz (70.1 kg)  03/02/19 160 lb (72.6 kg)  12/20/18 170 lb (77.1 kg)     Exam:    Vital Signs: Vital signs may also be  detailed in the HPI BP 138/88 (BP Location: Left Arm, Patient Position: Sitting, Cuff Size: Normal)    Pulse (!) 103    Temp (!) 96.4 F (35.8 C)    Ht 5\' 7"  (1.702 m)    Wt 154 lb 8 oz (70.1 kg)    BMI 24.20 kg/m  Constitutional:  oriented to person, place, and time. No distress.  HENT:  Head: Grossly normal Eyes:  no discharge. No scleral icterus.  Neck: No JVD, no carotid bruits  Cardiovascular: Regular rate and rhythm, no murmurs appreciated Pulmonary/Chest: Clear to auscultation bilaterally, no wheezes or rails Abdominal: Soft.  no distension.  no tenderness.  Musculoskeletal: Normal range of motion Neurological:  normal muscle tone. Coordination normal. No atrophy Skin: Skin warm and dry Psychiatric: normal affect, pleasant    ASSESSMENT & PLAN:    Atherosclerosis of native coronary artery of native heart with stable angina pectoris (HCC) Stressed importance of medication compliance Reports having some occasional chest burning, recommended we order a stress test Unable to treadmill given shortness of breath symptoms, continues to smoke, will order a pharmacologic study  Dilated aortic root (HCC) On CT attenuation corrected images for nuclear stress test above we can look at his aorta  Ascending aorta dilatation ([place]) Plan as above  Diabetes mellitus type 2 with complications ([place]) Stressed importance of compliance of his insulin History of noncompliance likely financial Though now gets most of his medications from the [place]  Tobacco abuse Still smoking 1 pack/week Smoking cessation recommended No desire to quit, we have discussed this with him again  S/P CABG x 4 Stress test as above  Mixed hyperlipidemia Stressed compliance with his statin  Disposition: Follow-up in 6 months   Signed, Ida Rogue, MD  03/16/2019 12:13 PM    [place] [facility] [address] [place] [id number], [place], [place] [address]

## 2019-03-16 ENCOUNTER — Other Ambulatory Visit: Payer: Self-pay

## 2019-03-16 ENCOUNTER — Encounter: Payer: Self-pay | Admitting: Family Medicine

## 2019-03-16 ENCOUNTER — Ambulatory Visit (INDEPENDENT_AMBULATORY_CARE_PROVIDER_SITE_OTHER): Payer: Medicare Other | Admitting: Cardiovascular Disease

## 2019-03-16 ENCOUNTER — Encounter: Payer: Self-pay | Admitting: Cardiovascular Disease

## 2019-03-16 ENCOUNTER — Ambulatory Visit: Payer: Self-pay | Admitting: Family Medicine

## 2019-03-16 VITALS — BP 138/88 | HR 103 | Temp 96.4°F | Ht 67.0 in | Wt 154.5 lb

## 2019-03-16 DIAGNOSIS — I25118 Atherosclerotic heart disease of native coronary artery with other forms of angina pectoris: Secondary | ICD-10-CM | POA: Diagnosis not present

## 2019-03-16 DIAGNOSIS — R079 Chest pain, unspecified: Secondary | ICD-10-CM | POA: Diagnosis not present

## 2019-03-16 DIAGNOSIS — Z113 Encounter for screening for infections with a predominantly sexual mode of transmission: Secondary | ICD-10-CM

## 2019-03-16 NOTE — Patient Instructions (Addendum)
Medication Instructions:  No changes  If you need a refill on your cardiac medications before your next appointment, please call your pharmacy.    Lab work: No new labs needed   If you have labs (blood work) drawn today and your tests are completely normal, you will receive your results only by: Marland Kitchen MyChart Message (if you have MyChart) OR . A paper copy in the mail If you have any lab test that is abnormal or we need to change your treatment, we will call you to review the results.   Testing/Procedures:   We will order a lexiscan myoview for chest pain, known CAD/angina [place]  Your caregiver has ordered a Stress Test with nuclear imaging. The purpose of this test is to evaluate the blood supply to your heart muscle. This procedure is referred to as a "Non-Invasive Stress Test." This is because other than having an IV started in your vein, nothing is inserted or "invades" your body. Cardiac stress tests are done to find areas of poor blood flow to the heart by determining the extent of coronary artery disease (CAD). Some patients exercise on a treadmill, which naturally increases the blood flow to your heart, while others who are  unable to walk on a treadmill due to physical limitations have a pharmacologic/chemical stress agent called Lexiscan . This medicine will mimic walking on a treadmill by temporarily increasing your coronary blood flow.   Please note: these test may take anywhere between 2-4 hours to complete  PLEASE REPORT TO [place] AT THE FIRST DESK WILL DIRECT YOU WHERE TO GO  Date of Procedure:_____________________________________  Arrival Time for Procedure:______________________________  Instructions regarding medication:   _XX___ : Hold diabetes medication morning of procedure  _XX___:  Hold Metoprolol the night before procedure and morning of procedure  _XX___:  Only take 1/2 dose of your Lantus the night before. That  would be 13 units of the Lantus the night before.   PLEASE NOTIFY THE OFFICE AT LEAST 73 HOURS IN ADVANCE IF YOU ARE UNABLE TO KEEP YOUR APPOINTMENT.  [phone] AND  PLEASE NOTIFY NUCLEAR MEDICINE AT [facility] AT LEAST 24 HOURS IN ADVANCE IF YOU ARE UNABLE TO KEEP YOUR APPOINTMENT. [phone]  How to prepare for your Myoview test:  1. Do not eat or drink after midnight 2. No caffeine for 24 hours prior to test 3. No smoking 24 hours prior to test. 4. Your medication may be taken with water.  If your doctor stopped a medication because of this test, do not take that medication. 5. Ladies, please do not wear dresses.  Skirts or pants are appropriate. Please wear a short sleeve shirt. 6. No perfume, cologne or lotion. 7. Wear comfortable walking shoes. No heels!    Follow-Up: At [facility], you and your health needs are our priority.  As part of our continuing mission to provide you with exceptional heart care, we have created designated Provider Care Teams.  These Care Teams include your primary Cardiologist (physician) and Advanced Practice Providers (APPs -  Physician Assistants and Nurse Practitioners) who all work together to provide you with the care you need, when you need it.  . You will need a follow up appointment in 12 months .   Please call our office 2 months in advance to schedule this appointment.    . Providers on your designated Care Team:   . Murray Hodgkins, NP . Christell Faith, PA-C . Marrianne Mood, PA-C  Any Other Special Instructions Will Be Listed Below (If Applicable).  For educational health videos Log in to : [email or web address] Or : [email or web address], password : [email or web address]

## 2019-03-16 NOTE — Progress Notes (Signed)
STI clinic/screening visit  Subjective:  Kyle [name] is a 58 y.o. male being seen today for an STI screening visit. The patient reports they do not have symptoms.  Patient has the following medical conditions:   Patient Active Problem List   Diagnosis Date Noted  . Ascending aorta dilatation (HCC) 09/06/2018  . Diabetes mellitus type 2 with complications ([place]) 99991111  . Tobacco abuse 09/06/2018  . DDD (degenerative disc disease), lumbar 01/10/2015  . Facet syndrome, lumbar 01/10/2015  . Lumbar radiculopathy 01/10/2015  . DDD (degenerative disc disease), cervical 11/22/2014  . Cervical facet syndrome 11/22/2014  . Bilateral occipital neuralgia 11/22/2014  . Hospital discharge follow-up 10/16/2014  . 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 S/P CABG x 4   . Chest pain with moderate risk for cardiac etiology 09/27/2014  . Cervicalgia 07/05/2014  . Acute bacterial rhinosinusitis 06/15/2014  . Non compliance w medication regimen 06/07/2014  . Chest tightness 06/07/2014  . Tobacco abuse counseling 01/05/2014  . Vitamin D deficiency 01/05/2014  . Transaminitis 01/05/2014  . Temporary low platelet count ([place]) 01/05/2014  . Diabetes mellitus type 2, uncontrolled ([place]) 01/05/2014  . Back pain 10/01/2013  . Need for prophylactic vaccination with tetanus-diphtheria (Td) 10/01/2013  . Penile lesion 10/01/2013  . Essential hypertension 08/29/2013  . Hyperlipidemia with target LDL less than 70 08/29/2013  . Callus of foot 08/29/2013  . Blurred vision 08/29/2013  . Itching 08/29/2013  . Exacerbation of chronic back pain 06/07/2013  . Hyperlipidemia 03/02/2013  . Former smoker 03/02/2013  . Routine general medical examination at a health care facility 02/24/2013  . Hepatitis C 02/24/2013  . Diabetes type 2, uncontrolled ([place]) 02/24/2013     No chief complaint on file.   HPI  Patient reports he is here for STD screening.  Denies  symptoms.  States he has STD screening every 6 months.  See flowsheet for further details and programmatic requirements.    The following portions of the patient's history were reviewed and updated as appropriate: allergies, current medications, past medical history, past social history, past surgical history and problem list.  Objective:  There were no vitals filed for this visit.  Physical Exam Constitutional:      Appearance: Normal appearance.  HENT:     Mouth/Throat:     Mouth: Mucous membranes are dry.     Pharynx: Oropharynx is clear. No oropharyngeal exudate or posterior oropharyngeal erythema.  Neck:     Musculoskeletal: No muscular tenderness.  Abdominal:     General: There is no distension.     Tenderness: There is no abdominal tenderness.     Hernia: There is no hernia in the left inguinal area or right inguinal area.  Genitourinary:    Penis: Normal.      Scrotum/Testes: Normal.     Epididymis:     Right: Normal.     Left: Normal.  Lymphadenopathy:     Cervical: No cervical adenopathy.     Lower Body: No right inguinal adenopathy. No left inguinal adenopathy.  Skin:    General: Skin is warm and dry.     Findings: No lesion or rash.  Neurological:     Mental Status: He is alert.    Assessment and Plan:  Kyle [name] is a 58 y.o. male presenting to the [facility] Department for STI screening  1. Screening examination for venereal disease  - HIV [place] [facility] - Syphilis Serology,  [place] [facility] - Gram stain - Chlamydia/Gonorrhea [place] [facility] Treat gram stain as per SO.   No follow-ups on file.  Future Appointments  Date Time Provider [place]  03/19/2019  9:30 AM [facility] [place], [place]

## 2019-03-16 NOTE — Progress Notes (Signed)
Gram stain reviewed. No tx per SO Aileen Fass, RN

## 2019-03-17 LAB — GRAM STAIN

## 2019-03-19 ENCOUNTER — Ambulatory Visit
Admission: RE | Admit: 2019-03-19 | Discharge: 2019-03-19 | Disposition: A | Discharge status: Home / Self Care | Payer: Medicare Other | Source: Ambulatory Visit | Attending: Cardiovascular Disease | Admitting: Cardiovascular Disease

## 2019-03-19 ENCOUNTER — Other Ambulatory Visit: Payer: Self-pay

## 2019-03-19 DIAGNOSIS — R079 Chest pain, unspecified: Secondary | ICD-10-CM

## 2019-03-19 MED ORDER — TECHNETIUM TC 99M TETROFOSMIN IV KIT
32.8800 | PACK | Freq: Once | INTRAVENOUS | Status: AC | PRN
  Administered 2019-03-19: 32.88 via INTRAVENOUS

## 2019-03-19 MED ORDER — TECHNETIUM TC 99M TETROFOSMIN IV KIT
10.4800 | PACK | Freq: Once | INTRAVENOUS | Status: AC | PRN
  Administered 2019-03-19: 10.48 via INTRAVENOUS

## 2019-03-19 MED ORDER — REGADENOSON 0.4 MG/5ML IV SOLN
0.4000 mg | Freq: Once | INTRAVENOUS | Status: AC
  Administered 2019-03-19: 0.4 mg via INTRAVENOUS

## 2019-03-20 LAB — NM MYOCAR MULTI W/SPECT W/WALL MOTION / EF
Estimated workload: 1 METS
Exercise duration (min): 0 min
Exercise duration (sec): 0 s
LV dias vol: 87 mL (ref 62–150)
LV sys vol: 29 mL
MPHR: 160 {beats}/min
Peak HR: 109 {beats}/min
Percent HR: 68 %
Rest HR: 89 {beats}/min
SDS: 1
SRS: 13
SSS: 1
TID: 0.97

## 2019-03-22 ENCOUNTER — Telehealth: Payer: Self-pay | Admitting: *Deleted

## 2019-03-22 NOTE — Telephone Encounter (Signed)
Patient returned call and reviewed results with him. He verbalized understanding with no further questions at this time.

## 2019-03-22 NOTE — Telephone Encounter (Signed)
-----   Message from Minna Merritts, MD sent at 03/21/2019  3:12 PM EST ----- Stress test No significant blockages noted Low normal ejection fraction Low risk study

## 2019-03-22 NOTE — Telephone Encounter (Signed)
Left voicemail message to call back for results.  

## 2019-04-11 ENCOUNTER — Emergency Department
Admission: EM | Admit: 2019-04-11 | Discharge: 2019-04-11 | Disposition: A | Discharge status: Home / Self Care | Payer: Medicare Other | Attending: Emergency Medicine | Admitting: Emergency Medicine

## 2019-04-11 ENCOUNTER — Other Ambulatory Visit: Payer: Self-pay

## 2019-04-11 ENCOUNTER — Encounter: Payer: Self-pay | Admitting: Emergency Medicine

## 2019-04-11 DIAGNOSIS — Z79899 Other long term (current) drug therapy: Secondary | ICD-10-CM | POA: Diagnosis not present

## 2019-04-11 DIAGNOSIS — I259 Chronic ischemic heart disease, unspecified: Secondary | ICD-10-CM | POA: Insufficient documentation

## 2019-04-11 DIAGNOSIS — J45909 Unspecified asthma, uncomplicated: Secondary | ICD-10-CM | POA: Diagnosis not present

## 2019-04-11 DIAGNOSIS — Z7982 Long term (current) use of aspirin: Secondary | ICD-10-CM | POA: Diagnosis not present

## 2019-04-11 DIAGNOSIS — J449 Chronic obstructive pulmonary disease, unspecified: Secondary | ICD-10-CM | POA: Insufficient documentation

## 2019-04-11 DIAGNOSIS — F17210 Nicotine dependence, cigarettes, uncomplicated: Secondary | ICD-10-CM | POA: Diagnosis not present

## 2019-04-11 DIAGNOSIS — I10 Essential (primary) hypertension: Secondary | ICD-10-CM | POA: Insufficient documentation

## 2019-04-11 DIAGNOSIS — E1165 Type 2 diabetes mellitus with hyperglycemia: Secondary | ICD-10-CM | POA: Insufficient documentation

## 2019-04-11 DIAGNOSIS — Z794 Long term (current) use of insulin: Secondary | ICD-10-CM | POA: Diagnosis not present

## 2019-04-11 DIAGNOSIS — Z951 Presence of aortocoronary bypass graft: Secondary | ICD-10-CM | POA: Insufficient documentation

## 2019-04-11 DIAGNOSIS — G629 Polyneuropathy, unspecified: Secondary | ICD-10-CM | POA: Insufficient documentation

## 2019-04-11 DIAGNOSIS — R202 Paresthesia of skin: Secondary | ICD-10-CM | POA: Diagnosis present

## 2019-04-11 LAB — CBC WITH DIFFERENTIAL/PLATELET
Abs Immature Granulocytes: 0.01 10*3/uL (ref 0.00–0.07)
Basophils Absolute: 0 10*3/uL (ref 0.0–0.1)
Basophils Relative: 1 %
Eosinophils Absolute: 0.2 10*3/uL (ref 0.0–0.5)
Eosinophils Relative: 3 %
HCT: 43.3 % (ref 39.0–52.0)
Hemoglobin: 14.1 g/dL (ref 13.0–17.0)
Immature Granulocytes: 0 %
Lymphocytes Relative: 43 %
Lymphs Abs: 3.1 10*3/uL (ref 0.7–4.0)
MCH: 27.5 pg (ref 26.0–34.0)
MCHC: 32.6 g/dL (ref 30.0–36.0)
MCV: 84.6 fL (ref 80.0–100.0)
Monocytes Absolute: 1 10*3/uL (ref 0.1–1.0)
Monocytes Relative: 13 %
Neutro Abs: 2.9 10*3/uL (ref 1.7–7.7)
Neutrophils Relative %: 40 %
Platelets: 153 10*3/uL (ref 150–400)
RBC: 5.12 MIL/uL (ref 4.22–5.81)
RDW: 12 % (ref 11.5–15.5)
WBC: 7.2 10*3/uL (ref 4.0–10.5)
nRBC: 0 % (ref 0.0–0.2)

## 2019-04-11 LAB — COMPREHENSIVE METABOLIC PANEL WITH GFR
ALT: 83 U/L — ABNORMAL HIGH (ref 0–44)
AST: 48 U/L — ABNORMAL HIGH (ref 15–41)
Albumin: 4 g/dL (ref 3.5–5.0)
Alkaline Phosphatase: 56 U/L (ref 38–126)
Anion gap: 10 (ref 5–15)
BUN: 7 mg/dL (ref 6–20)
CO2: 25 mmol/L (ref 22–32)
Calcium: 9.4 mg/dL (ref 8.9–10.3)
Chloride: 95 mmol/L — ABNORMAL LOW (ref 98–111)
Creatinine, Ser: 0.76 mg/dL (ref 0.61–1.24)
GFR calc Af Amer: >60 mL/min
GFR calc non Af Amer: >60 mL/min
Glucose, Bld: 477 mg/dL — ABNORMAL HIGH (ref 70–99)
Potassium: 4.7 mmol/L (ref 3.5–5.1)
Sodium: 130 mmol/L — ABNORMAL LOW (ref 135–145)
Total Bilirubin: 1.2 mg/dL (ref 0.3–1.2)
Total Protein: 7.3 g/dL (ref 6.5–8.1)

## 2019-04-11 LAB — GLUCOSE, CAPILLARY: Glucose-Capillary: 390 mg/dL — ABNORMAL HIGH (ref 70–99)

## 2019-04-11 MED ORDER — MELOXICAM 7.5 MG PO TABS
15.0000 mg | ORAL_TABLET | Freq: Once | ORAL | Status: AC
  Administered 2019-04-11: 15 mg via ORAL
  Filled 2019-04-11: qty 2

## 2019-04-11 MED ORDER — INSULIN ASPART PROT & ASPART (70-30 MIX) 100 UNIT/ML ~~LOC~~ SUSP
10.0000 [IU] | Freq: Once | SUBCUTANEOUS | Status: AC
  Administered 2019-04-11: 18:00:00 10 [IU] via SUBCUTANEOUS
  Filled 2019-04-11: qty 10

## 2019-04-11 MED ORDER — METFORMIN HCL 500 MG PO TABS
500.0000 mg | ORAL_TABLET | Freq: Once | ORAL | Status: AC
  Administered 2019-04-11: 500 mg via ORAL
  Filled 2019-04-11: qty 1

## 2019-04-11 MED ORDER — MELOXICAM 15 MG PO TABS: 15.0000 mg | ORAL_TABLET | Freq: Every day | ORAL | 0 refills | Status: DC

## 2019-04-11 NOTE — ED Triage Notes (Signed)
C/O bilateral leg burning when flexing feet and pain to bottom of feet.  States symptoms have been ongoing and has been seen through ED recently for same.  Patient is AAOx3.  Skin warm and dry. NAD

## 2019-04-11 NOTE — ED Provider Notes (Signed)
[facility] Emergency Department Provider Note  ____________________________________________  Time seen: Approximately 5:40 PM  I have reviewed the triage vital signs and the nursing notes.   HISTORY  Chief Complaint Leg Pain    HPI Kyle [name] is a 58 y.o. male who presents to the emergency department complaining of bilateral leg and feet pain.  Patient reports that he feels like he has numbness and tingling through both legs, accompanied by a sharp burning sensation at nighttime.  Patient denies any trauma.  He denies any back pain, bowel or bladder dysfunction, saddle anesthesia or paresthesias.  Patient states he still has sensation he just has a tingling sensation.  No medications at home prior to arrival.  Patient has a significant medical history to include chronic back pain, coronary artery disease, degenerative disc disease, diabetes, GERD, hepatitis, hypertension.  Patient states that he does not like taking his diabetes medication and has not taken his medicines in several days.  No other complaints of pain at this time.         Past Medical History:  Diagnosis Date  . Allergy   . Asthma   . Chronic back pain 1981   a.followed by pain clinic and neurosurgery.  Marland Kitchen COPD (chronic obstructive pulmonary disease) ([place])   . Coronary artery disease involving native coronary artery    a. 10/2012 CABG x 4 @ [facility] (LIMA->LAD, VG->RI, VG->OM, VG->PDA);  b. 08/2014 Lexiscan MV: small defect of mild severity present along apical lateral & apex location, no st segment changes, LV function 45-54%, poor gated images lead to poor EF & wall motion assessment, low risk study.  . DDD (degenerative disc disease)   . DM2 (diabetes mellitus, type 2) ([place])   . Fainting spell   . GERD (gastroesophageal reflux disease)   . Hepatitis C    2/2 tattoo  . Hyperlipidemia with target LDL less than 70   . Hypertension   . Polysubstance abuse ([place])    a. ongoing  daily tobacco abuse and etoh (on the weekends), remote crack/cocaine abuse last used approximately in 2006  . S/P CABG x 4    a. LIMA-LAD, SVG-[place], SVG-OM,SVG-dRCA  . Urine incontinence     Patient Active Problem List   Diagnosis Date Noted  . Ascending aorta dilatation (HCC) 09/06/2018  . Diabetes mellitus type 2 with complications ([place]) 99991111  . Tobacco abuse 09/06/2018  . DDD (degenerative disc disease), lumbar 01/10/2015  . Facet syndrome, lumbar 01/10/2015  . Lumbar radiculopathy 01/10/2015  . DDD (degenerative disc disease), cervical 11/22/2014  . Cervical facet syndrome 11/22/2014  . Bilateral occipital neuralgia 11/22/2014  . Hospital discharge follow-up 10/16/2014  . 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 S/P CABG x 4   . Chest pain with moderate risk for cardiac etiology 09/27/2014  . Cervicalgia 07/05/2014  . Acute bacterial rhinosinusitis 06/15/2014  . Non compliance w medication regimen 06/07/2014  . Chest tightness 06/07/2014  . Tobacco abuse counseling 01/05/2014  . Vitamin D deficiency 01/05/2014  . Transaminitis 01/05/2014  . Temporary low platelet count ([place]) 01/05/2014  . Diabetes mellitus type 2, uncontrolled ([place]) 01/05/2014  . Back pain 10/01/2013  . Need for prophylactic vaccination with tetanus-diphtheria (Td) 10/01/2013  . Penile lesion 10/01/2013  . Essential hypertension 08/29/2013  . Hyperlipidemia with target LDL less than 70 08/29/2013  . Callus of foot 08/29/2013  . Blurred vision 08/29/2013  . Itching 08/29/2013  . Exacerbation of chronic  back pain 06/07/2013  . Hyperlipidemia 03/02/2013  . Former smoker 03/02/2013  . Routine general medical examination at a health care facility 02/24/2013  . Hepatitis C 02/24/2013  . Diabetes type 2, uncontrolled ([place]) 02/24/2013    Past Surgical History:  Procedure Laterality Date  . CARDIAC CATHETERIZATION  11/2012   [facility] - Multivessel  CAD  . CORONARY ARTERY BYPASS GRAFT  8/14   CABG x 4; LIMA-LAD, SVG-RI, SVG-OM1, SVG-rPDA ([facility] RIMA).  . neck injection      Prior to Admission medications   Medication Sig Start Date End Date Taking? Authorizing Provider  aspirin EC 81 MG tablet Take 1 tablet (81 mg total) by mouth daily. 05/17/16   Minna Merritts, MD  benzonatate (TESSALON PERLES) 100 MG capsule Take 2 capsules (200 mg total) by mouth 3 (three) times daily as needed for cough. 12/20/18 12/20/19  Triplett, Johnette Abraham B, FNP  Cetirizine HCl 10 MG CAPS Take 1 capsule (10 mg total) by mouth daily. 12/20/18   Triplett, Cari B, FNP  insulin glargine (LANTUS) 100 UNIT/ML injection Inject 30 Units into the skin 2 (two) times daily.    [provider]  isosorbide mononitrate (IMDUR) 30 MG 24 hr tablet Take 1 tablet (30 mg total) by mouth daily. 06/08/18   Dunn, Areta Haber, PA-C  meloxicam (MOBIC) 15 MG tablet Take 1 tablet (15 mg total) by mouth daily. 04/11/19   , Charline Bills, PA-C  metFORMIN (GLUCOPHAGE) 1000 MG tablet Take 1 tablet (1,000 mg total) by mouth 2 (two) times daily with a meal. 03/02/19 05/01/19  Blake Divine, MD  metoprolol succinate (TOPROL-XL) 25 MG 24 hr tablet Take 2 tablets (50 mg total) by mouth daily. 06/08/18   Dunn, Areta Haber, PA-C  nitroGLYCERIN (NITROSTAT) 0.4 MG SL tablet Place 1 tablet (0.4 mg total) under the tongue every 5 (five) minutes as needed for chest pain. 09/29/14   Aldean Jewett, MD  rosuvastatin (CRESTOR) 10 MG tablet Take 1 tablet (10 mg total) by mouth at bedtime. 06/08/18   Rise Mu, PA-C    Allergies Trazodone and nefazodone, Amoxicillin, Cyclobenzaprine, Methadone, Penicillin g, Propoxyphene, Tramadol, and Trazodone  Family History  Problem Relation Age of Onset  . Heart disease Mother   . Heart disease Father   . Hypertension Father   . Diabetes Sister   . Cancer Sister        skin cancer  . Diabetes Other   . Heart disease Other     Social History Social History    Tobacco Use  . Smoking status: Current Every Day Smoker    Packs/day: 0.25    Years: 35.00    Pack years: 8.75    Types: Cigarettes  . Smokeless tobacco: Never Used  . Tobacco comment: a pack lasts 2 to 4 days   Substance Use Topics  . Alcohol use: Yes    Alcohol/week: 0.0 standard drinks    Comment: 2-3 beers/week  . Drug use: No     Review of Systems  Constitutional: No fever/chills Eyes: No visual changes. No discharge ENT: No upper respiratory complaints. Cardiovascular: no chest pain. Respiratory: no cough. No SOB. Gastrointestinal: No abdominal pain.  No nausea, no vomiting.  No diarrhea.  No constipation. Genitourinary: Negative for dysuria. No hematuria Musculoskeletal: Bilateral leg and feet pain, worse at nighttime Skin: Negative for rash, abrasions, lacerations, ecchymosis. Neurological: Negative for headaches, focal weakness or numbness. 10-point ROS otherwise negative.  ____________________________________________   PHYSICAL EXAM:  VITAL SIGNS: ED Triage Vitals  Enc Vitals Group     BP 04/11/19 1346 (!) 154/95     Pulse Rate 04/11/19 1346 (!) 110     Resp 04/11/19 1346 16     Temp 04/11/19 1346 98.8 F (37.1 C)     Temp Source 04/11/19 1346 Oral     SpO2 04/11/19 1346 99 %     Weight 04/11/19 1352 170 lb (77.1 kg)     Height 04/11/19 1352 5\' 8"  (1.727 m)     Head Circumference --      Peak Flow --      Pain Score 04/11/19 1352 5     Pain Loc --      Pain Edu? --      Excl. in [place]? --      Constitutional: Alert and oriented. Well appearing and in no acute distress. Eyes: Conjunctivae are normal. PERRL. EOMI. Head: Atraumatic. ENT:      Ears:       Nose: No congestion/rhinnorhea.      Mouth/Throat: Mucous membranes are moist.  Neck: No stridor.    Cardiovascular: Normal rate, regular rhythm. Normal S1 and S2.  Good peripheral circulation. Respiratory: Normal respiratory effort without tachypnea or retractions. Lungs CTAB. Good air entry to  the bases with no decreased or absent breath sounds. Gastrointestinal: Bowel sounds 4 quadrants. Soft and nontender to palpation. No guarding or rigidity. No palpable masses. No distention. No CVA tenderness. Musculoskeletal: Full range of motion to all extremities. No gross deformities appreciated.  No visible abnormality about bilateral lower extremities.  Full range of motion of bilateral lower extremities.  Dorsalis pedis pulse intact bilaterally.  Sensation intact and equal in all dermatomal distributions. Neurologic:  Normal speech and language. No gross focal neurologic deficits are appreciated.  Skin:  Skin is warm, dry and intact. No rash noted. Psychiatric: Mood and affect are normal. Speech and behavior are normal. Patient exhibits appropriate insight and judgement.   ____________________________________________   LABS (all labs ordered are listed, but only abnormal results are displayed)  Labs Reviewed  COMPREHENSIVE METABOLIC PANEL - Abnormal; Notable for the following components:      Result Value   Sodium 130 (*)    Chloride 95 (*)    Glucose, Bld 477 (*)    AST 48 (*)    ALT 83 (*)    All other components within normal limits  GLUCOSE, CAPILLARY - Abnormal; Notable for the following components:   Glucose-Capillary 390 (*)    All other components within normal limits  CBC WITH DIFFERENTIAL/PLATELET  CBG MONITORING, ED   ____________________________________________  EKG   ____________________________________________  RADIOLOGY   No results found.  ____________________________________________    PROCEDURES  Procedure(s) performed:    Procedures    Medications  metFORMIN (GLUCOPHAGE) tablet 500 mg (500 mg Oral Given 04/11/19 1811)  meloxicam (MOBIC) tablet 15 mg (15 mg Oral Given 04/11/19 1811)  insulin aspart protamine- aspart (NOVOLOG MIX 70/30) injection 10 Units (10 Units Subcutaneous Given 04/11/19 1810)      ____________________________________________   INITIAL IMPRESSION / ASSESSMENT AND PLAN / ED COURSE  Pertinent labs & imaging results that were available during my care of the patient were reviewed by me and considered in my medical decision making (see chart for details).  Review of the [place] CSRS was performed in accordance of the [place] prior to dispensing any controlled drugs.           Patient's diagnosis is consistent  with neuropathy, type 2 diabetes uncontrolled, hyperglycemia.  Patient presented to emergency department complaining of bilateral foot pain.  Symptoms and physical exam are consistent with neuropathy which is consistent with patient's uncontrolled diabetes.  Patient states that he does not like taking his medication has not taken his medicine in several days.  His blood sugar is 477 here in the emergency department but his anion gap is only 10.  At this time, no evidence of hypoglycemia hyperosmolar state.  No evidence of DKA.  Patient is given a dose of his metformin and humalog.  Patient is to restart his Metformin at home on a daily basis.  Patient will be prescribed anti-inflammatory for his neuropathy.  Follow up with primary care. Patient is given ED precautions to return to the ED for any worsening or new symptoms.     ____________________________________________  FINAL CLINICAL IMPRESSION(S) / ED DIAGNOSES  Final diagnoses:  Neuropathy  Uncontrolled type 2 diabetes mellitus with hyperglycemia ([place])      NEW MEDICATIONS STARTED DURING THIS VISIT:  ED Discharge Orders         Ordered    meloxicam (MOBIC) 15 MG tablet  Daily     04/11/19 1924              This chart was dictated using voice recognition software/Dragon. Despite best efforts to proofread, errors can occur which can change the meaning. Any change was purely unintentional.    Darletta Moll, PA-C 04/11/19 2110    Nance Pear, MD 04/11/19 2131

## 2019-04-11 NOTE — ED Notes (Signed)
Pt noted walking in and out of the [place] in NAD. With a steady gait.

## 2019-08-13 ENCOUNTER — Emergency Department
Admission: EM | Admit: 2019-08-13 | Discharge: 2019-08-13 | Disposition: A | Discharge status: Home / Self Care | Payer: Medicare Other | Attending: Emergency Medicine | Admitting: Emergency Medicine

## 2019-08-13 ENCOUNTER — Other Ambulatory Visit: Payer: Self-pay

## 2019-08-13 ENCOUNTER — Emergency Department: Payer: Medicare Other

## 2019-08-13 ENCOUNTER — Encounter: Payer: Self-pay | Admitting: Emergency Medicine

## 2019-08-13 DIAGNOSIS — I10 Essential (primary) hypertension: Secondary | ICD-10-CM | POA: Insufficient documentation

## 2019-08-13 DIAGNOSIS — Z794 Long term (current) use of insulin: Secondary | ICD-10-CM | POA: Diagnosis not present

## 2019-08-13 DIAGNOSIS — J449 Chronic obstructive pulmonary disease, unspecified: Secondary | ICD-10-CM | POA: Insufficient documentation

## 2019-08-13 DIAGNOSIS — E1165 Type 2 diabetes mellitus with hyperglycemia: Secondary | ICD-10-CM

## 2019-08-13 DIAGNOSIS — E119 Type 2 diabetes mellitus without complications: Secondary | ICD-10-CM | POA: Insufficient documentation

## 2019-08-13 DIAGNOSIS — Z79899 Other long term (current) drug therapy: Secondary | ICD-10-CM | POA: Insufficient documentation

## 2019-08-13 DIAGNOSIS — B349 Viral infection, unspecified: Secondary | ICD-10-CM

## 2019-08-13 DIAGNOSIS — Z7982 Long term (current) use of aspirin: Secondary | ICD-10-CM | POA: Diagnosis not present

## 2019-08-13 DIAGNOSIS — J45909 Unspecified asthma, uncomplicated: Secondary | ICD-10-CM | POA: Diagnosis not present

## 2019-08-13 DIAGNOSIS — R519 Headache, unspecified: Secondary | ICD-10-CM | POA: Diagnosis present

## 2019-08-13 DIAGNOSIS — I2510 Atherosclerotic heart disease of native coronary artery without angina pectoris: Secondary | ICD-10-CM | POA: Diagnosis not present

## 2019-08-13 DIAGNOSIS — F17210 Nicotine dependence, cigarettes, uncomplicated: Secondary | ICD-10-CM | POA: Insufficient documentation

## 2019-08-13 DIAGNOSIS — Z951 Presence of aortocoronary bypass graft: Secondary | ICD-10-CM | POA: Insufficient documentation

## 2019-08-13 DIAGNOSIS — Z20822 Contact with and (suspected) exposure to covid-19: Secondary | ICD-10-CM | POA: Insufficient documentation

## 2019-08-13 LAB — GLUCOSE, CAPILLARY: Glucose-Capillary: 323 mg/dL — ABNORMAL HIGH (ref 70–99)

## 2019-08-13 LAB — BASIC METABOLIC PANEL
Anion gap: 9 (ref 5–15)
BUN: 10 mg/dL (ref 6–20)
CO2: 23 mmol/L (ref 22–32)
Calcium: 9.3 mg/dL (ref 8.9–10.3)
Chloride: 100 mmol/L (ref 98–111)
Creatinine, Ser: 0.69 mg/dL (ref 0.61–1.24)
GFR calc Af Amer: >60 mL/min (ref >60)
GFR calc non Af Amer: >60 mL/min (ref >60)
Glucose, Bld: 456 mg/dL — ABNORMAL HIGH (ref 70–99)
Potassium: 4.2 mmol/L (ref 3.5–5.1)
Sodium: 132 mmol/L — ABNORMAL LOW (ref 135–145)

## 2019-08-13 LAB — CBC
HCT: 43.1 % (ref 39.0–52.0)
Hemoglobin: 14.3 g/dL (ref 13.0–17.0)
MCH: 28 pg (ref 26.0–34.0)
MCHC: 33.2 g/dL (ref 30.0–36.0)
MCV: 84.5 fL (ref 80.0–100.0)
Platelets: 136 10*3/uL — ABNORMAL LOW (ref 150–400)
RBC: 5.1 MIL/uL (ref 4.22–5.81)
RDW: 12.4 % (ref 11.5–15.5)
WBC: 7.9 10*3/uL (ref 4.0–10.5)
nRBC: 0 % (ref 0.0–0.2)

## 2019-08-13 LAB — SARS CORONAVIRUS 2 (TAT 6-24 HRS): SARS Coronavirus 2: NEGATIVE

## 2019-08-13 MED ORDER — SODIUM CHLORIDE 0.9 % IV BOLUS
1000.0000 mL | Freq: Once | INTRAVENOUS | Status: AC
  Administered 2019-08-13: 1000 mL via INTRAVENOUS

## 2019-08-13 MED ORDER — ALBUTEROL SULFATE (2.5 MG/3ML) 0.083% IN NEBU
5.0000 mg | INHALATION_SOLUTION | Freq: Once | RESPIRATORY_TRACT | Status: AC
  Administered 2019-08-13: 5 mg via RESPIRATORY_TRACT

## 2019-08-13 NOTE — ED Provider Notes (Signed)
[facility] Emergency Department Provider Note  ____________________________________________   First MD Initiated Contact with Patient 08/13/19 1151     (approximate)  I have reviewed the triage vital signs and the nursing notes.   HISTORY  Chief Complaint Headache, Sore Throat, Abdominal Pain, Shortness of Breath, and Cough   HPI Kyle [name] is a 59 y.o. male presents to the ED with complaint of shortness of breath, headache, slight cough, headache, nausea and abdominal pain.  Patient denies any vomiting or diarrhea.  He is not aware of any fever and denies chills.  Patient is a insulin-dependent diabetic and states that he gets his medication from the [facility] hospital.  He reports that he has not taken any insulin in the last 2 days.     Past Medical History:  Diagnosis Date  . Allergy   . Asthma   . Chronic back pain 1981   a.followed by pain clinic and neurosurgery.  Marland Kitchen COPD (chronic obstructive pulmonary disease) ([place])   . Coronary artery disease involving native coronary artery    a. 10/2012 CABG x 4 @ [facility] (LIMA->LAD, VG->RI, VG->OM, VG->PDA);  b. 08/2014 Lexiscan MV: small defect of mild severity present along apical lateral & apex location, no st segment changes, LV function 45-54%, poor gated images lead to poor EF & wall motion assessment, low risk study.  . DDD (degenerative disc disease)   . DM2 (diabetes mellitus, type 2) ([place])   . Fainting spell   . GERD (gastroesophageal reflux disease)   . Hepatitis C    2/2 tattoo  . Hyperlipidemia with target LDL less than 70   . Hypertension   . Polysubstance abuse ([place])    a. ongoing daily tobacco abuse and etoh (on the weekends), remote crack/cocaine abuse last used approximately in 2006  . S/P CABG x 4    a. LIMA-LAD, SVG-Ramus, SVG-OM,SVG-dRCA  . Urine incontinence     Patient Active Problem List   Diagnosis Date Noted  . Ascending aorta dilatation (HCC) 09/06/2018  . Diabetes  mellitus type 2 with complications ([place]) 99991111  . Tobacco abuse 09/06/2018  . DDD (degenerative disc disease), lumbar 01/10/2015  . Facet syndrome, lumbar 01/10/2015  . Lumbar radiculopathy 01/10/2015  . DDD (degenerative disc disease), cervical 11/22/2014  . Cervical facet syndrome 11/22/2014  . Bilateral occipital neuralgia 11/22/2014  . Hospital discharge follow-up 10/16/2014  . Atherosclerosis of native coronary artery with stable angina pectoris ([place])   . S/P CABG x 4   . Chest pain with moderate risk for cardiac etiology 09/27/2014  . Cervicalgia 07/05/2014  . Acute bacterial rhinosinusitis 06/15/2014  . Non compliance w medication regimen 06/07/2014  . Chest tightness 06/07/2014  . Tobacco abuse counseling 01/05/2014  . Vitamin D deficiency 01/05/2014  . Transaminitis 01/05/2014  . Temporary low platelet count ([place]) 01/05/2014  . Diabetes mellitus type 2, uncontrolled ([place]) 01/05/2014  . Back pain 10/01/2013  . Need for prophylactic vaccination with tetanus-diphtheria (Td) 10/01/2013  . Penile lesion 10/01/2013  . Essential hypertension 08/29/2013  . Hyperlipidemia with target LDL less than 70 08/29/2013  . Callus of foot 08/29/2013  . Blurred vision 08/29/2013  . Itching 08/29/2013  . Exacerbation of chronic back pain 06/07/2013  . Hyperlipidemia 03/02/2013  . Former smoker 03/02/2013  . Routine general medical examination at a health care facility 02/24/2013  . Hepatitis C 02/24/2013  . Diabetes type 2, uncontrolled ([place]) 02/24/2013    Past Surgical History:  Procedure Laterality Date  .  CARDIAC CATHETERIZATION  11/2012   [facility] - Multivessel CAD  . CORONARY ARTERY BYPASS GRAFT  8/14   CABG x 4; LIMA-LAD, SVG-RI, SVG-OM1, SVG-rPDA (Atretic RIMA).  . neck injection      Prior to Admission medications   Medication Sig Start Date End Date Taking? Authorizing Provider  aspirin EC 81 MG tablet Take 1 tablet (81 mg total) by mouth daily. 05/17/16    Minna Merritts, MD  Cetirizine HCl 10 MG CAPS Take 1 capsule (10 mg total) by mouth daily. 12/20/18   Triplett, Cari B, FNP  insulin glargine (LANTUS) 100 UNIT/ML injection Inject 30 Units into the skin 2 (two) times daily.    [provider]  isosorbide mononitrate (IMDUR) 30 MG 24 hr tablet Take 1 tablet (30 mg total) by mouth daily. 06/08/18   Rise Mu, PA-C  metFORMIN (GLUCOPHAGE) 1000 MG tablet Take 1 tablet (1,000 mg total) by mouth 2 (two) times daily with a meal. 03/02/19 05/01/19  Blake Divine, MD  metoprolol succinate (TOPROL-XL) 25 MG 24 hr tablet Take 2 tablets (50 mg total) by mouth daily. 06/08/18   Dunn, Areta Haber, PA-C  nitroGLYCERIN (NITROSTAT) 0.4 MG SL tablet Place 1 tablet (0.4 mg total) under the tongue every 5 (five) minutes as needed for chest pain. 09/29/14   Aldean Jewett, MD  rosuvastatin (CRESTOR) 10 MG tablet Take 1 tablet (10 mg total) by mouth at bedtime. 06/08/18   Rise Mu, PA-C    Allergies Trazodone and nefazodone, Amoxicillin, Cyclobenzaprine, Methadone, Penicillin g, Propoxyphene, Tramadol, and Trazodone  Family History  Problem Relation Age of Onset  . Heart disease Mother   . Heart disease Father   . Hypertension Father   . Diabetes Sister   . Cancer Sister        skin cancer  . Diabetes Other   . Heart disease Other     Social History Social History   Tobacco Use  . Smoking status: Current Every Day Smoker    Packs/day: 0.25    Years: 35.00    Pack years: 8.75    Types: Cigarettes  . Smokeless tobacco: Never Used  . Tobacco comment: a pack lasts 2 to 4 days   Substance Use Topics  . Alcohol use: Yes    Alcohol/week: 0.0 standard drinks    Comment: 2-3 beers/week  . Drug use: No    Review of Systems Constitutional: No fever/chills Eyes: No visual changes. ENT: No sore throat. Cardiovascular: Denies chest pain. Respiratory: Positive shortness of breath.  Positive for cough. Gastrointestinal: No abdominal pain.   Positive nausea, no vomiting.  No diarrhea.  No constipation. Genitourinary: Negative for dysuria. Musculoskeletal: Negative for back pain. Skin: Negative for rash. Neurological: Positive for generalized headache.  No focal weakness or numbness. ____________________________________________   PHYSICAL EXAM:  VITAL SIGNS: ED Triage Vitals  Enc Vitals Group     BP 08/13/19 1042 (!) 150/98     Pulse Rate 08/13/19 1042 (!) 103     Resp 08/13/19 1042 18     Temp 08/13/19 1042 97.7 F (36.5 C)     Temp Source 08/13/19 1042 Oral     SpO2 08/13/19 1042 98 %     Weight 08/13/19 1043 158 lb (71.7 kg)     Height 08/13/19 1043 5\' 7"  (1.702 m)     Head Circumference --      Peak Flow --      Pain Score 08/13/19 1043 7  Pain Loc --      Pain Edu? --      Excl. in [place]? --    Constitutional: Alert and oriented. Well appearing and in no acute distress. Eyes: Conjunctivae are normal. PERRL. EOMI. Head: Atraumatic. Nose: No congestion/rhinnorhea. Mouth/Throat: Mucous membranes are moist.  Oropharynx non-erythematous. Neck: No stridor.   Cardiovascular: Normal rate, regular rhythm. Grossly normal heart sounds.  Good peripheral circulation. Respiratory: Normal respiratory effort.  No retractions. Lungs CTAB. Gastrointestinal: Soft and nontender. No distention.  Bowel sounds normoactive x4 quadrants. Musculoskeletal: No lower extremity tenderness nor edema.  No joint effusions. Neurologic:  Normal speech and language. No gross focal neurologic deficits are appreciated. No gait instability. Skin:  Skin is warm, dry and intact. No rash noted. Psychiatric: Mood and affect are normal. Speech and behavior are normal.  ____________________________________________   LABS (all labs ordered are listed, but only abnormal results are displayed)  Labs Reviewed  BASIC METABOLIC PANEL - Abnormal; Notable for the following components:      Result Value   Sodium 132 (*)    Glucose, Bld 456 (*)    All  other components within normal limits  CBC - Abnormal; Notable for the following components:   Platelets 136 (*)    All other components within normal limits  GLUCOSE, CAPILLARY - Abnormal; Notable for the following components:   Glucose-Capillary 323 (*)    All other components within normal limits  SARS CORONAVIRUS 2 (TAT 6-24 HRS)  CBG MONITORING, ED   RADIOLOGY  Official radiology report(s): DG Chest 2 View  Result Date: 08/13/2019 CLINICAL DATA:  Cough, shortness of breath EXAM: CHEST - 2 VIEW COMPARISON:  03/02/2019 FINDINGS: Prior CABG. The heart size and mediastinal contours are within normal limits. Both lungs are clear. The visualized skeletal structures are unremarkable. IMPRESSION: No active cardiopulmonary disease. Electronically Signed   By: Davina Poke D.O.   On: 08/13/2019 11:33    ____________________________________________   PROCEDURES  Procedure(s) performed (including Critical Care):  Procedures   ____________________________________________   INITIAL IMPRESSION / ASSESSMENT AND PLAN / ED COURSE  As part of my medical decision making, I reviewed the following data within the electronic MEDICAL RECORD NUMBER Notes from prior ED visits and [place] Controlled Substance Database  59 year old male presents to the ED with complaint of shortness of breath, headache, cough, nausea without vomiting and denies any fever.  Patient gets all his care at the [facility] but cannot recall the last time he was there for his diabetes.  He states that he has not had his insulin in the last 2 days as he did not want to take it.  He states that he has plenty of medication at home.  He was somewhat surprised that his blood sugar initially was 456.  In looking through his records most the time when he is in the ED it is over 400.  Patient was made aware that he does not have control of his diabetes and was given problems that can occur from having no control of your blood sugar.  Patient  agrees to make an appointment at the [facility] hospital and talk to his doctor about better compliance.  He was also given some information about nutrition with diabetes.  Patient was given 1 L of fluids and his blood sugar was 323 prior to discharge.  Remaining lab work and chest x-ray were unremarkable. ____________________________________________   FINAL CLINICAL IMPRESSION(S) / ED DIAGNOSES  Final diagnoses:  Viral illness  Poorly  controlled diabetes mellitus [facility])     ED Discharge Orders    None       Note:  This document was prepared using Dragon voice recognition software and may include unintentional dictation errors.    Johnn Hai, PA-C 08/13/19 1606    Carrie Mew, MD 08/14/19 [phone]

## 2019-08-13 NOTE — ED Notes (Signed)
See triage note  Presents with some SOB and h/a  Also has had some slight cough  Afebrile on arrival

## 2019-08-13 NOTE — Discharge Instructions (Addendum)
Call make an appointment with the [facility] hospital to improve management of your diabetes.  Our records show that for the last 6 months your blood sugar has been over 400 each time you have been here.  Read information about foods you should be eating and with your diabetes.  Check your blood sugars often.

## 2019-08-13 NOTE — ED Triage Notes (Signed)
Pt reports for the last week has had some cough, SOB, headache, nausea and abd pain. Denies vomiting ot diarrhea or fever.

## 2019-10-11 ENCOUNTER — Ambulatory Visit: Payer: Medicare Other

## 2019-10-12 ENCOUNTER — Other Ambulatory Visit: Payer: Self-pay

## 2019-10-12 ENCOUNTER — Ambulatory Visit: Payer: Self-pay | Admitting: Physician Assistant

## 2019-10-12 DIAGNOSIS — Z113 Encounter for screening for infections with a predominantly sexual mode of transmission: Secondary | ICD-10-CM

## 2019-10-12 LAB — GRAM STAIN

## 2019-10-13 ENCOUNTER — Encounter: Payer: Self-pay | Admitting: Physician Assistant

## 2019-10-13 NOTE — Progress Notes (Signed)
[facility] Department STI clinic/screening visit  Subjective:  Kyle [name] is a 59 y.o. male being seen today for an STI screening visit. The patient reports they do not have symptoms.    Patient has the following medical conditions:   Patient Active Problem List   Diagnosis Date Noted  . Ascending aorta dilatation (HCC) 09/06/2018  . Diabetes mellitus type 2 with complications ([place]) [id number]/[id number]  . Tobacco abuse 09/06/2018  . DDD (degenerative disc disease), lumbar 01/10/2015  . Facet syndrome, lumbar 01/10/2015  . Lumbar radiculopathy 01/10/2015  . DDD (degenerative disc disease), cervical 11/22/2014  . Cervical facet syndrome 11/22/2014  . Bilateral occipital neuralgia 11/22/2014  . Hospital discharge follow-up 10/16/2014  . Atherosclerosis of native coronary artery with stable angina pectoris ([place])   . S/P CABG x 4   . Chest pain with moderate risk for cardiac etiology 09/27/2014  . Cervicalgia 07/05/2014  . Acute bacterial rhinosinusitis 06/15/2014  . Non compliance w medication regimen 06/07/2014  . Chest tightness 06/07/2014  . Tobacco abuse counseling 01/05/2014  . Vitamin D deficiency 01/05/2014  . Transaminitis 01/05/2014  . Temporary low platelet count ([place]) 01/05/2014  . Diabetes mellitus type 2, uncontrolled ([place]) 01/05/2014  . Back pain 10/01/2013  . Need for prophylactic vaccination with tetanus-diphtheria (Td) 10/01/2013  . Penile lesion 10/01/2013  . Essential hypertension 08/29/2013  . Hyperlipidemia with target LDL less than 70 08/29/2013  . Callus of foot 08/29/2013  . Blurred vision 08/29/2013  . Itching 08/29/2013  . Exacerbation of chronic back pain 06/07/2013  . Hyperlipidemia 03/02/2013  . Former smoker 03/02/2013  . Routine general medical examination at a health care facility 02/24/2013  . Hepatitis C 02/24/2013  . Diabetes type 2, uncontrolled ([place]) 02/24/2013     Chief Complaint  Patient presents with  . SEXUALLY  TRANSMITTED DISEASE    screening    HPI  Patient reports that he is not having any symptoms but would like a screening today.  Reports multiple medical conditions for which he takes medications.  Reports last HIV test was in 2020.  Last void prior to sample collection was more than 2 hr ago.   See flowsheet for further details and programmatic requirements.    The following portions of the patient's history were reviewed and updated as appropriate: allergies, current medications, past medical history, past social history, past surgical history and problem list.  Objective:  There were no vitals filed for this visit.  Physical Exam Constitutional:      General: He is not in acute distress.    Appearance: Normal appearance.  HENT:     Head: Normocephalic and atraumatic.     Comments: No nits, lice, or hair loss. No cervical, supraclavicular or axillary adenopathy.    Mouth/Throat:     Mouth: Mucous membranes are moist.     Pharynx: Oropharynx is clear. No oropharyngeal exudate or posterior oropharyngeal erythema.  Eyes:     Conjunctiva/sclera: Conjunctivae normal.  Pulmonary:     Effort: Pulmonary effort is normal.  Abdominal:     Palpations: Abdomen is soft. There is no mass.     Tenderness: There is no abdominal tenderness. There is no guarding or rebound.  Genitourinary:    Penis: Normal.      Testes: Normal.     Comments: Pubic area without nits, lice, edema, erythema, lesions and inguinal adenopathy. Penis circumcised, without rash, lesions and discharge at meatus. Musculoskeletal:     Cervical back: Neck supple. No  tenderness.  Skin:    General: Skin is warm and dry.     Findings: No bruising, erythema, lesion or rash.  Neurological:     Mental Status: He is alert and oriented to person, place, and time.  Psychiatric:        Mood and Affect: Mood normal.        Thought Content: Thought content normal.        Judgment: Judgment normal.       Assessment and  Plan:  FARUQ ROSENBERGER is a 59 y.o. male presenting to the [facility] Department for STI screening  1. Screening for STD (sexually transmitted disease) Patient into clinic without symptoms. Reviewed with patient that exam and Gram stain are normal and no treatment is indicated today. Rec condoms with all sex. Await test results.  Counseled that RN will call if needs to RTC for treatment once results are back. Enc to continue regular follow up with PCP for chronic conditions. - Gram stain - Gonococcus culture - HIV [facility] - Syphilis Serology, [facility]     No follow-ups on file.  No future appointments.  Jerene Dilling, PA

## 2019-10-17 LAB — GONOCOCCUS CULTURE

## 2019-12-02 ENCOUNTER — Other Ambulatory Visit: Payer: Self-pay | Admitting: Physician Assistant

## 2019-12-02 DIAGNOSIS — R7989 Other specified abnormal findings of blood chemistry: Secondary | ICD-10-CM

## 2019-12-02 DIAGNOSIS — D696 Thrombocytopenia, unspecified: Secondary | ICD-10-CM

## 2019-12-02 DIAGNOSIS — B1920 Unspecified viral hepatitis C without hepatic coma: Secondary | ICD-10-CM

## 2019-12-10 ENCOUNTER — Other Ambulatory Visit: Payer: Self-pay

## 2019-12-10 ENCOUNTER — Ambulatory Visit
Admission: RE | Admit: 2019-12-10 | Discharge: 2019-12-10 | Disposition: A | Discharge status: Home / Self Care | Payer: Medicare Other | Source: Ambulatory Visit | Attending: Physician Assistant | Admitting: Physician Assistant

## 2019-12-10 DIAGNOSIS — D696 Thrombocytopenia, unspecified: Secondary | ICD-10-CM

## 2019-12-10 DIAGNOSIS — B1920 Unspecified viral hepatitis C without hepatic coma: Secondary | ICD-10-CM | POA: Insufficient documentation

## 2019-12-10 DIAGNOSIS — R7989 Other specified abnormal findings of blood chemistry: Secondary | ICD-10-CM | POA: Diagnosis present

## 2019-12-14 NOTE — Progress Notes (Signed)
12/15/2019 12:59 PM   Kyle [name] 03-26-1961 [id number]  Referring provider: Crissie Figures, PA-C [address] [place],  [place] [address] Chief Complaint  Patient presents with   Other    HPI: Kyle [name] is a 59 y.o. male presents today for evaluation and management of penile disorder.   -Patient establish Care at [facility] on 11/30/2019. He was previously seen by a PCP in [place], [place]. -Patient noted a penile disorder and wanted to be referred to urology for further management.  -3 to 4 month history of painful erection, "knot" on penis and leftward curvature estimated at 30 degrees -Complains of the ED, no prior treatment -On daily nitrate dose for coronary artery disease -Organic risk factors coronary artery disease, diabetes, tobacco history, hypertension, hyperlipidemia, antihypertensive medication   PMH: Past Medical History:  Diagnosis Date   Allergy    Asthma    Chronic back pain 1981   a.followed by pain clinic and neurosurgery.   COPD (chronic obstructive pulmonary disease) (HCC)    Coronary artery disease involving native coronary artery    a. 10/2012 CABG x 4 @ [facility] (LIMA->LAD, VG->RI, VG->OM, VG->PDA);  b. 08/2014 Lexiscan MV: small defect of mild severity present along apical lateral & apex location, no st segment changes, LV function 45-54%, poor gated images lead to poor EF & wall motion assessment, low risk study.   DDD (degenerative disc disease)    DM2 (diabetes mellitus, type 2) ([place])    Fainting spell    GERD (gastroesophageal reflux disease)    Hepatitis C    2/2 tattoo   Hyperlipidemia with target LDL less than 70    Hypertension    Polysubstance abuse ([place])    a. ongoing daily tobacco abuse and etoh (on the weekends), remote crack/cocaine abuse last used approximately in 2006   S/P CABG x 4    a. LIMA-LAD, SVG-Ramus, SVG-OM,SVG-dRCA   Urine incontinence     Surgical History: Past  Surgical History:  Procedure Laterality Date   CARDIAC CATHETERIZATION  11/2012   [facility] - Multivessel CAD   CORONARY ARTERY BYPASS GRAFT  8/14   CABG x 4; LIMA-LAD, SVG-RI, SVG-OM1, SVG-rPDA (Atretic RIMA).   neck injection      Home Medications:  Allergies as of 12/15/2019      Reactions   Trazodone And Nefazodone    Reaction: GI upset   Amoxicillin Itching   Cyclobenzaprine Itching, Rash   rash rash   Methadone Itching   Reaction: Gi upset Reaction: Gi upset   Penicillin G Itching   Propoxyphene Hives   Tramadol Nausea And Vomiting   Trazodone Nausea And Vomiting   Reaction: GI upset Reaction: GI upset      Medication List       Accurate as of December 15, 2019 11:59 PM. If you have any questions, ask your nurse or doctor.        STOP taking these medications   metFORMIN 1000 MG tablet Commonly known as: Glucophage Stopped by: Abbie Sons, MD   rosuvastatin 10 MG tablet Commonly known as: CRESTOR Stopped by: Abbie Sons, MD     TAKE these medications   aspirin EC 81 MG tablet Take 1 tablet (81 mg total) by mouth daily.   Cetirizine HCl 10 MG Caps Take 1 capsule (10 mg total) by mouth daily.   insulin glargine 100 UNIT/ML injection Commonly known as: LANTUS Inject 30 Units into the skin 2 (two)  times daily.   isosorbide mononitrate 30 MG 24 hr tablet Commonly known as: Imdur Take 1 tablet (30 mg total) by mouth daily.   metoprolol succinate 25 MG 24 hr tablet Commonly known as: TOPROL-XL Take 2 tablets (50 mg total) by mouth daily.   nitroGLYCERIN 0.4 MG SL tablet Commonly known as: NITROSTAT Place 1 tablet (0.4 mg total) under the tongue [name] 5 (five) minutes as needed for chest pain.       Allergies:  Allergies  Allergen Reactions   Trazodone And Nefazodone     Reaction: GI upset   Amoxicillin Itching   Cyclobenzaprine Itching and Rash    rash rash   Methadone Itching    Reaction: Gi upset Reaction: Gi  upset   Penicillin G Itching   Propoxyphene Hives   Tramadol Nausea And Vomiting   Trazodone Nausea And Vomiting    Reaction: GI upset Reaction: GI upset    Family History: Family History  Problem Relation Age of Onset   Heart disease Mother    Heart disease Father    Hypertension Father    Diabetes Sister    Cancer Sister        skin cancer   Diabetes Other    Heart disease Other     Social History:  reports that he has been smoking cigarettes. He has a 8.75 pack-year smoking history. He has never used smokeless tobacco. He reports current alcohol use. He reports that he does not use drugs.   Physical Exam: BP (!) 141/93    Pulse (!) 108    Ht 5\' 7"  (1.702 m)    Wt 150 lb (68 kg)    BMI 23.49 kg/m   Constitutional:  Alert and oriented, No acute distress. HEENT: [place] AT, moist mucus membranes.  Trachea midline, no masses. Cardiovascular: No clubbing, cyanosis, or edema. Respiratory: Normal respiratory effort, no increased work of breathing. GI: Abdomen is soft, nontender, nondistended, no abdominal masses GU: No CVA tenderness.  No bladder fullness or masses.  Patient with circumcised phallus. Extensive corporal plaque right from mid-shaft extending proximally. Urethral meatus is patent.  No penile discharge. No penile lesions or rashes. Scrotum without lesions, cysts, rashes and/or edema.  Testicles are located scrotally bilaterally. No masses are appreciated in the testicles. Left and right epididymis are normal. Lymph: No cervical or inguinal lymphadenopathy. Skin: No rashes, bruises or suspicious lesions. Neurologic: Grossly intact, no focal deficits, moving all 4 extremities. Psychiatric: Normal mood and affect.  Laboratory Data:  Lab Results  Component Value Date   CREATININE 0.69 08/13/2019    Assessment & Plan:    1. Peyronie's disease  Based on patient's history of physical exam, findings are consistent with Peyronie's disease.   We discussed  active and chronic disease phases of Peyronie's, currently in the active phase and we discussed no treatment recommended until chronic phase and stable curvature  2. Erectile dysfunction Not a PDE 5 inhibitor candidate secondary to chronic nitrate therapy We discussed vacuum erection devices  Follow up in 3 months.   [place] [address] [place], [place] [address] [phone]  I, Selena Batten, am acting as a scribe for Dr. Nicki Reaper C. Daryel Kenneth,  I have reviewed the above documentation for accuracy and completeness, and I agree with the above.   Abbie Sons, MD

## 2019-12-15 ENCOUNTER — Other Ambulatory Visit: Payer: Self-pay

## 2019-12-15 ENCOUNTER — Ambulatory Visit (INDEPENDENT_AMBULATORY_CARE_PROVIDER_SITE_OTHER): Payer: Medicare Other | Admitting: Urology

## 2019-12-15 ENCOUNTER — Encounter: Payer: Self-pay | Admitting: Urology

## 2019-12-15 VITALS — BP 141/93 | HR 108 | Ht 67.0 in | Wt 150.0 lb

## 2019-12-15 DIAGNOSIS — N486 Induration penis plastica: Secondary | ICD-10-CM | POA: Diagnosis not present

## 2019-12-15 DIAGNOSIS — N529 Male erectile dysfunction, unspecified: Secondary | ICD-10-CM | POA: Diagnosis not present

## 2019-12-19 ENCOUNTER — Encounter: Payer: Self-pay | Admitting: Urology

## 2019-12-19 DIAGNOSIS — N486 Induration penis plastica: Secondary | ICD-10-CM | POA: Insufficient documentation

## 2019-12-19 DIAGNOSIS — N529 Male erectile dysfunction, unspecified: Secondary | ICD-10-CM | POA: Insufficient documentation

## 2020-01-25 ENCOUNTER — Ambulatory Visit: Payer: Medicare Other

## 2020-03-16 ENCOUNTER — Encounter: Payer: Self-pay | Admitting: Urology

## 2020-03-16 ENCOUNTER — Ambulatory Visit: Payer: Self-pay | Admitting: Urology

## 2020-03-17 ENCOUNTER — Ambulatory Visit: Payer: Self-pay | Admitting: Urology

## 2020-03-27 ENCOUNTER — Other Ambulatory Visit: Payer: Self-pay

## 2020-03-27 ENCOUNTER — Emergency Department
Admission: EM | Admit: 2020-03-27 | Discharge: 2020-03-27 | Disposition: A | Discharge status: Home / Self Care | Payer: Medicare Other

## 2020-03-27 ENCOUNTER — Ambulatory Visit (INDEPENDENT_AMBULATORY_CARE_PROVIDER_SITE_OTHER): Payer: Medicare Other | Admitting: Urology

## 2020-03-27 ENCOUNTER — Encounter: Payer: Self-pay | Admitting: Urology

## 2020-03-27 VITALS — BP 164/106 | HR 97 | Ht 67.0 in | Wt 150.0 lb

## 2020-03-27 DIAGNOSIS — N486 Induration penis plastica: Secondary | ICD-10-CM | POA: Diagnosis not present

## 2020-03-27 NOTE — Progress Notes (Signed)
03/27/2020 2:56 PM   Kyle [name] 1961/01/30 [id number]  Referring provider: Center, [facility] [address] [place],  [place] [address]  Chief Complaint  Patient presents with  . Abnormal Penile Curvature    HPI: 59 y.o. male presents for follow-up of Peyronie's disease.   Initially seen August 2021  30 leftward curvature and painful erections  Was in the active phase of disease and no treatment recommended  Also complaining ED but on chronic nitrate therapy and unable to take PDE 5 inhibitors; vacuum erection devices were discussed  Since last visit states his erections are still painful and curvature has increased slightly and is between 30-45 degrees   PMH: Past Medical History:  Diagnosis Date  . Allergy   . Asthma   . Chronic back pain 1981   a.followed by pain clinic and neurosurgery.  Marland Kitchen COPD (chronic obstructive pulmonary disease) ([place])   . Coronary artery disease involving native coronary artery    a. 10/2012 CABG x 4 @ [facility] (LIMA->LAD, VG->RI, VG->OM, VG->PDA);  b. 08/2014 Lexiscan MV: small defect of mild severity present along apical lateral & apex location, no st segment changes, LV function 45-54%, poor gated images lead to poor EF & wall motion assessment, low risk study.  . DDD (degenerative disc disease)   . DM2 (diabetes mellitus, type 2) ([place])   . Fainting spell   . GERD (gastroesophageal reflux disease)   . Hepatitis C    2/2 tattoo  . Hyperlipidemia with target LDL less than 70   . Hypertension   . Polysubstance abuse ([place])    a. ongoing daily tobacco abuse and etoh (on the weekends), remote crack/cocaine abuse last used approximately in 2006  . S/P CABG x 4    a. LIMA-LAD, SVG-Ramus, SVG-OM,SVG-dRCA  . Urine incontinence     Surgical History: Past Surgical History:  Procedure Laterality Date  . CARDIAC CATHETERIZATION  11/2012   [facility] - Multivessel CAD  . CORONARY ARTERY BYPASS GRAFT  8/14   CABG x 4;  LIMA-LAD, SVG-RI, SVG-OM1, SVG-rPDA (Atretic RIMA).  . neck injection      Home Medications:  Allergies as of 03/27/2020      Reactions   Trazodone And Nefazodone    Reaction: GI upset   Amoxicillin Itching   Cyclobenzaprine Itching, Rash   rash rash   Methadone Itching   Reaction: Gi upset Reaction: Gi upset   Penicillin G Itching   Propoxyphene Hives   Tramadol Nausea And Vomiting   Trazodone Nausea And Vomiting   Reaction: GI upset Reaction: GI upset      Medication List       Accurate as of March 27, 2020  2:56 PM. If you have any questions, ask your nurse or doctor.        aspirin EC 81 MG tablet Take 1 tablet (81 mg total) by mouth daily.   Cetirizine HCl 10 MG Caps Take 1 capsule (10 mg total) by mouth daily.   insulin glargine 100 UNIT/ML injection Commonly known as: LANTUS Inject 30 Units into the skin 2 (two) times daily.   isosorbide mononitrate 30 MG 24 hr tablet Commonly known as: Imdur Take 1 tablet (30 mg total) by mouth daily.   metoprolol succinate 25 MG 24 hr tablet Commonly known as: TOPROL-XL Take 2 tablets (50 mg total) by mouth daily.   nitroGLYCERIN 0.4 MG SL tablet Commonly known as: NITROSTAT Place 1 tablet (0.4 mg total) under the tongue  [name] 5 (five) minutes as needed for chest pain.       Allergies:  Allergies  Allergen Reactions  . Trazodone And Nefazodone     Reaction: GI upset  . Amoxicillin Itching  . Cyclobenzaprine Itching and Rash    rash rash  . Methadone Itching    Reaction: Gi upset Reaction: Gi upset  . Penicillin G Itching  . Propoxyphene Hives  . Tramadol Nausea And Vomiting  . Trazodone Nausea And Vomiting    Reaction: GI upset Reaction: GI upset    Family History: Family History  Problem Relation Age of Onset  . Heart disease Mother   . Heart disease Father   . Hypertension Father   . Diabetes Sister   . Cancer Sister        skin cancer  . Diabetes Other   . Heart disease Other      Social History:  reports that he has been smoking cigarettes. He has a 8.75 pack-year smoking history. He has never used smokeless tobacco. He reports current alcohol use. He reports that he does not use drugs.   Physical Exam: BP (!) 164/106   Pulse 97   Ht 5\' 7"  (1.702 m)   Wt 150 lb (68 kg)   BMI 23.49 kg/m   Constitutional:  Alert and oriented, No acute distress. HEENT: [place] AT, moist mucus membranes.  Trachea midline, no masses. Cardiovascular: No clubbing, cyanosis, or edema. Respiratory: Normal respiratory effort, no increased work of breathing.   Assessment & Plan:    1.  Peyronie's disease  We reviewed the acute and chronic phases of Peyronie's disease and with pain and curvature that is not stable and he remains in the acute phase.  No treatment is recommended for acute phase Peyronie's and will continue to follow  He was given literature on Xiaflex  Follow-up 6 months  Abbie Sons, MD  [facility] [address] [place], [place] [address] [phone]

## 2020-03-27 NOTE — ED Triage Notes (Signed)
Pt states he is leaving and will make an appointment in the morning to see his MD.

## 2020-03-31 ENCOUNTER — Ambulatory Visit: Payer: Medicare Other | Admitting: Nurse Practitioner

## 2020-04-02 ENCOUNTER — Encounter: Payer: Self-pay | Admitting: Urology

## 2020-04-12 ENCOUNTER — Encounter: Payer: Self-pay | Admitting: Nurse Practitioner

## 2020-04-12 ENCOUNTER — Other Ambulatory Visit: Payer: Self-pay

## 2020-04-12 ENCOUNTER — Ambulatory Visit: Payer: Medicare Other | Admitting: Nurse Practitioner

## 2020-04-12 VITALS — BP 138/82 | HR 98 | Ht 67.0 in | Wt 150.5 lb

## 2020-04-12 DIAGNOSIS — I25118 Atherosclerotic heart disease of native coronary artery with other forms of angina pectoris: Secondary | ICD-10-CM | POA: Diagnosis not present

## 2020-04-12 DIAGNOSIS — I77810 Thoracic aortic ectasia: Secondary | ICD-10-CM

## 2020-04-12 DIAGNOSIS — I10 Essential (primary) hypertension: Secondary | ICD-10-CM | POA: Diagnosis not present

## 2020-04-12 DIAGNOSIS — E785 Hyperlipidemia, unspecified: Secondary | ICD-10-CM | POA: Diagnosis not present

## 2020-04-12 DIAGNOSIS — Z720 Tobacco use: Secondary | ICD-10-CM

## 2020-04-12 NOTE — Progress Notes (Signed)
Office Visit    Patient Name: Kyle [name] Date of Encounter: 04/12/2020  Primary Care Provider:  [facility] [place] Primary Cardiologist:  Ida Rogue, MD  Chief Complaint    59 year old male with a history of CAD status post CABG x4 in July 2014, hypertension, hyperlipidemia, diabetes, and tobacco abuse, who presents for follow-up related to CAD.  Past Medical History    Past Medical History:  Diagnosis Date  . Allergy   . Asthma   . Chronic back pain 1981   a.followed by pain clinic and neurosurgery.  Marland Kitchen COPD (chronic obstructive pulmonary disease) ([place])   . Coronary artery disease involving native coronary artery    a. 10/2012 CABG x 4 @ [facility] (LIMA->LAD, VG->RI, VG->OM, VG->PDA);  b. 08/2014 Lexiscan MV: small defect of mild severity present along apical lateral & apex location, no st segment changes, LV function 45-54%, poor gated images lead to poor EF & wall motion assessment, low risk study; c. 02/2019 MV: EF 53%, apical defect (atten vs scar), No ischemia-->low risk.  . DDD (degenerative disc disease)   . Diastolic dysfunction    a. 02/2019 Echo: EF 60-65%, mod LVH. Gr2 DD. Nl RV fxn. Unable to exclude bicuspid AoV.  . Dilated aortic root ([place])    a. 02/2019 Echo: Ao root 59mm, Asc Ao 53mm, Ao arch 48mm.  Marland Kitchen DM2 (diabetes mellitus, type 2) ([place])   . Fainting spell   . GERD (gastroesophageal reflux disease)   . Hepatitis C    2/2 tattoo  . History of medication noncompliance   . Hyperlipidemia with target LDL less than 70   . Hypertension   . Polysubstance abuse ([place])    a. ongoing daily tobacco abuse and etoh (on the weekends), remote crack/cocaine abuse last used approximately in 2006  . S/P CABG x 4    a. LIMA-LAD, SVG-Ramus, SVG-OM,SVG-dRCA  . Urine incontinence    Past Surgical History:  Procedure Laterality Date  . CARDIAC CATHETERIZATION  11/2012   [facility] - Multivessel CAD  . CORONARY ARTERY BYPASS GRAFT  8/14   CABG  x 4; LIMA-LAD, SVG-RI, SVG-OM1, SVG-rPDA (Atretic RIMA).  . neck injection      Allergies  Allergies  Allergen Reactions  . Trazodone And Nefazodone     Reaction: GI upset  . Amoxicillin Itching  . Cyclobenzaprine Itching and Rash    rash rash  . Methadone Itching    Reaction: Gi upset Reaction: Gi upset  . Penicillin G Itching  . Propoxyphene Hives  . Tramadol Nausea And Vomiting  . Trazodone Nausea And Vomiting    Reaction: GI upset Reaction: GI upset    History of Present Illness    59 year old male with above past medical history including coronary artery disease status post prior four-vessel bypass at the [facility] in July 2014.  Other history includes hypertension, hyperlipidemia, tobacco abuse, remote crack/cocaine abuse, diabetes, GERD, and COPD.  He was last seen in clinic in November 2020, at which time he reported occasional chest burning and stress testing was undertaken and showed a small fixed apical defect-artifact versus scar, and no significant ischemia.  CT attenuation corrected images showed severe three-vessel coronary calcification with very mild aortic atherosclerosis.  Overall, the scan was felt to be low risk and medical management was recommended.  Echocardiography was also performed showing normal LV function (60-65%), grade 2 diastolic dysfunction, and mild to moderate dilatation of the aortic root (44 mm), and  ascending aorta (40 mm).  Imaging was unable to exclude a bicuspid aortic valve.  Over the past year, he notes that he has been stable. He occasionally notes a sharp and shooting chest pain but denies dyspnea. He is active in and around his home without symptoms or limitations. He continues to smoke 1 to 2 cigarettes a day. He denies any recent drug use. He denies palpitations, PND, orthopnea, dizziness, syncope, edema, or early satiety. He initially says that has been compliant with his medications but in discussing further, he is not  actually sure what he is taking and says that he has missed several of his medications over the past few days.  Home Medications    Prior to Admission medications   Medication Sig Start Date End Date Taking? Authorizing Provider  aspirin EC 81 MG tablet Take 1 tablet (81 mg total) by mouth daily. 05/17/16   Minna Merritts, MD  Cetirizine HCl 10 MG CAPS Take 1 capsule (10 mg total) by mouth daily. 12/20/18   Triplett, Cari B, FNP  insulin glargine (LANTUS) 100 UNIT/ML injection Inject 30 Units into the skin 2 (two) times daily.    [provider]  isosorbide mononitrate (IMDUR) 30 MG 24 hr tablet Take 1 tablet (30 mg total) by mouth daily. 06/08/18   Rise Mu, PA-C  metoprolol succinate (TOPROL-XL) 25 MG 24 hr tablet Take 2 tablets (50 mg total) by mouth daily. 06/08/18   Dunn, Areta Haber, PA-C  nitroGLYCERIN (NITROSTAT) 0.4 MG SL tablet Place 1 tablet (0.4 mg total) under the tongue every 5 (five) minutes as needed for chest pain. 09/29/14   Aldean Jewett, MD    Review of Systems    Occasional sharp and fleeting chest pain. He denies dyspnea, palpitations, PND, orthopnea, dizziness, syncope, edema, or early satiety..  All other systems reviewed and are otherwise negative except as noted above.  Physical Exam    VS:  BP 138/82 (BP Location: Left Arm, Patient Position: Sitting, Cuff Size: Normal)   Pulse 98   Ht 5\' 7"  (1.702 m)   Wt 150 lb 8 oz (68.3 kg)   SpO2 97%   BMI 23.57 kg/m  , BMI Body mass index is 23.57 kg/m. GEN: Well nourished, well developed, in no acute distress. HEENT: normal. Neck: Supple, no JVD, carotid bruits, or masses. Cardiac: RRR, no murmurs, rubs, or gallops. No clubbing, cyanosis, edema.  Radials/PT 1+ and equal bilaterally.  Respiratory:  Respirations regular and unlabored, diminished breath sounds bilaterally. GI: Soft, nontender, nondistended, BS + x 4. MS: no deformity or atrophy. Skin: warm and dry, no rash. Neuro:  Strength and sensation are  intact. Psych: Normal affect.  Accessory Clinical Findings    ECG personally reviewed by me today -regular sinus rhythm, 98, RBBB- no acute changes.  Lab Results  Component Value Date   WBC 7.9 08/13/2019   HGB 14.3 08/13/2019   HCT 43.1 08/13/2019   MCV 84.5 08/13/2019   PLT 136 (L) 08/13/2019   Lab Results  Component Value Date   CREATININE 0.69 08/13/2019   BUN 10 08/13/2019   NA 132 (L) 08/13/2019   K 4.2 08/13/2019   CL 100 08/13/2019   CO2 23 08/13/2019   Lab Results  Component Value Date   ALT 83 (H) 04/11/2019   AST 48 (H) 04/11/2019   ALKPHOS 56 04/11/2019   BILITOT 1.2 04/11/2019   Lab Results  Component Value Date   CHOL 156 06/08/2018   HDL  36 (L) 06/08/2018   LDLCALC 78 06/08/2018   LDLDIRECT 97 06/08/2018   TRIG 212 (H) 06/08/2018   CHOLHDL 4.3 06/08/2018    Lab Results  Component Value Date   HGBA1C 9.5 (H) 09/28/2014    Assessment & Plan    1. Coronary artery disease: Status post CABG x4 in July 2014. In the setting of atypical chest discomfort he had a nonischemic Myoview November 2020. Over the past year he has occasionally noted sharp and shooting chest pain but overall, symptoms have been stable. He is unsure as to which medications he is taking. He has missed several over the past few days if not longer. I have asked him to provide [place] with a list of his medications from the [place] as it appears that some of them are different than what we have on file. Preferably, he will remain on aspirin, statin, beta-blocker, and nitrate. His current list does not have a statin but he says he is taking Lipitor. He will bring [place] his list or call back with a list tomorrow.  2. Essential hypertension: Blood pressure mildly elevated but he has not taken his medicines in at least 2 days. Encouraged him to go home and take his medicines and provide [place] with a list.  3. Hyperlipidemia: He used to be on rosuvastatin but says that he was switched to atorvastatin by the [place].  Encouraged compliance.  4. Tobacco abuse: Continues to smoke 1 to 2 cigarettes a day. Complete cessation advised.  5. Dilated ascending aorta: Due for follow-up echo. Also question of bicuspid aortic valve on prior echo.  6. Type 2 diabetes mellitus: Historically poorly controlled. This is followed at the [place]. He says he has been compliant with Lantus.  7. Disposition: Follow-up echo. He will bring a list of his medications for [place]. Follow-up in clinic in 1 year or sooner if necessary.   Murray Hodgkins, NP 04/12/2020, 4:13 PM

## 2020-04-12 NOTE — Patient Instructions (Signed)
Medication Instructions:  No changes  PLEASE call us with your medications or bring in your pill bottles so we can update your medication list.   *If you need a refill on your cardiac medications before your next appointment, please call your pharmacy*   Lab Work: None  If you have labs (blood work) drawn today and your tests are completely normal, you will receive your results only by: Marland Kitchen MyChart Message (if you have MyChart) OR . A paper copy in the mail If you have any lab test that is abnormal or we need to change your treatment, we will call you to review the results.   Testing/Procedures: Your physician has requested that you have an echocardiogram. Echocardiography is a painless test that uses sound waves to create images of your heart. It provides your doctor with information about the size and shape of your heart and how well your heart's chambers and valves are working. This procedure takes approximately one hour. There are no restrictions for this procedure.     Follow-Up: At [facility], you and your health needs are our priority.  As part of our continuing mission to provide you with exceptional heart care, we have created designated Provider Care Teams.  These Care Teams include your primary Cardiologist (physician) and Advanced Practice Providers (APPs -  Physician Assistants and Nurse Practitioners) who all work together to provide you with the care you need, when you need it.  We recommend signing up for the patient portal called "MyChart".  Sign up information is provided on this After Visit Summary.  MyChart is used to connect with patients for Virtual Visits (Telemedicine).  Patients are able to view lab/test results, encounter notes, upcoming appointments, etc.  Non-urgent messages can be sent to your provider as well.   To learn more about what you can do with MyChart, go to [email or web address].    Your next appointment:   1 year(s)  The format for your  next appointment:   In Person  Provider:   You may see Ida Rogue, MD or one of the following Advanced Practice Providers on your designated Care Team:    Murray Hodgkins, NP  Christell Faith, PA-C  Marrianne Mood, PA-C  Cadence [place], [place]  Laurann [place], NP

## 2020-05-12 ENCOUNTER — Ambulatory Visit (INDEPENDENT_AMBULATORY_CARE_PROVIDER_SITE_OTHER): Payer: Medicare PPO

## 2020-05-12 ENCOUNTER — Other Ambulatory Visit: Payer: Medicare PPO

## 2020-05-12 ENCOUNTER — Other Ambulatory Visit: Payer: Self-pay

## 2020-05-12 DIAGNOSIS — I77810 Thoracic aortic ectasia: Secondary | ICD-10-CM | POA: Diagnosis not present

## 2020-05-12 DIAGNOSIS — I10 Essential (primary) hypertension: Secondary | ICD-10-CM | POA: Diagnosis not present

## 2020-05-13 LAB — ECHOCARDIOGRAM COMPLETE
AR max vel: 3.31 cm2
AV Area VTI: 3.76 cm2
AV Area mean vel: 2.99 cm2
AV Mean grad: 3 mmHg
AV Peak grad: 5.3 mmHg
Ao pk vel: 1.15 m/s
Area-P 1/2: 4.15 cm2
Calc EF: 45.3 %
S' Lateral: 3.3 cm
Single Plane A2C EF: 44.1 %
Single Plane A4C EF: 46.7 %

## 2020-05-15 ENCOUNTER — Telehealth: Payer: Self-pay | Admitting: Nurse Practitioner

## 2020-05-15 DIAGNOSIS — I77810 Thoracic aortic ectasia: Secondary | ICD-10-CM

## 2020-05-15 NOTE — Telephone Encounter (Signed)
Theora Gianotti, NP  05/13/2020 11:32 AM EST      Low normal heart squeezing function at 50-55%. Heart remains mildly stiff. The aorta remains moderately dilated, measuring up to 64mm. At that size, we do recommend a CTA of the chest/aorta in order to better assess and quantify the size of the aorta. He is followed at the [place] as well, but if he wishes to have it done through [place], he will need a bmet beforehand.

## 2020-05-15 NOTE — Telephone Encounter (Signed)
Attempted to call the patient x 2 tries. Message received stating "call cannot be completed as dialed."  Will attempt to call back at a later time.

## 2020-05-16 NOTE — Telephone Encounter (Signed)
Spoke with patient and reviewed his results and recommendations. He reports that he no longer goes to [place] due to his provider retiring and no other provider is available at this time. Provided him with number to call for appointment to have CTA done [phone]. Also reviewed that once his CTA has been scheduled he [facility] to have labs done a day or two before it is scheduled. Reviewed that no appointment is needed for this and he just needs to check in at registration. He verbalized understanding of results, recommendations, and our conversation. He was appreciative for the call with no further concerns at this time.

## 2020-05-16 NOTE — Telephone Encounter (Signed)
Thanks for the update

## 2020-06-12 ENCOUNTER — Ambulatory Visit: Admission: RE | Admit: 2020-06-12 | Payer: Medicare PPO | Source: Ambulatory Visit

## 2020-06-23 ENCOUNTER — Ambulatory Visit: Admission: RE | Admit: 2020-06-23 | Payer: Medicare PPO | Source: Ambulatory Visit

## 2020-06-28 ENCOUNTER — Other Ambulatory Visit: Payer: Self-pay

## 2020-06-28 ENCOUNTER — Emergency Department
Admission: EM | Admit: 2020-06-28 | Discharge: 2020-06-28 | Disposition: A | Discharge status: Home / Self Care | Payer: Medicare PPO | Attending: Emergency Medicine | Admitting: Emergency Medicine

## 2020-06-28 ENCOUNTER — Emergency Department: Payer: Medicare PPO

## 2020-06-28 ENCOUNTER — Ambulatory Visit (INDEPENDENT_AMBULATORY_CARE_PROVIDER_SITE_OTHER): Payer: Medicare PPO | Admitting: Urology

## 2020-06-28 ENCOUNTER — Encounter: Payer: Self-pay | Admitting: Emergency Medicine

## 2020-06-28 ENCOUNTER — Encounter: Payer: Self-pay | Admitting: Urology

## 2020-06-28 VITALS — BP 124/84 | HR 122 | Ht 67.0 in | Wt 149.0 lb

## 2020-06-28 DIAGNOSIS — N529 Male erectile dysfunction, unspecified: Secondary | ICD-10-CM | POA: Diagnosis not present

## 2020-06-28 DIAGNOSIS — I10 Essential (primary) hypertension: Secondary | ICD-10-CM | POA: Insufficient documentation

## 2020-06-28 DIAGNOSIS — Z7982 Long term (current) use of aspirin: Secondary | ICD-10-CM | POA: Insufficient documentation

## 2020-06-28 DIAGNOSIS — J449 Chronic obstructive pulmonary disease, unspecified: Secondary | ICD-10-CM | POA: Insufficient documentation

## 2020-06-28 DIAGNOSIS — I25118 Atherosclerotic heart disease of native coronary artery with other forms of angina pectoris: Secondary | ICD-10-CM | POA: Insufficient documentation

## 2020-06-28 DIAGNOSIS — Z951 Presence of aortocoronary bypass graft: Secondary | ICD-10-CM | POA: Insufficient documentation

## 2020-06-28 DIAGNOSIS — F17210 Nicotine dependence, cigarettes, uncomplicated: Secondary | ICD-10-CM | POA: Insufficient documentation

## 2020-06-28 DIAGNOSIS — E119 Type 2 diabetes mellitus without complications: Secondary | ICD-10-CM | POA: Diagnosis not present

## 2020-06-28 DIAGNOSIS — Z79899 Other long term (current) drug therapy: Secondary | ICD-10-CM | POA: Diagnosis not present

## 2020-06-28 DIAGNOSIS — Z794 Long term (current) use of insulin: Secondary | ICD-10-CM | POA: Insufficient documentation

## 2020-06-28 DIAGNOSIS — R0789 Other chest pain: Secondary | ICD-10-CM | POA: Diagnosis not present

## 2020-06-28 DIAGNOSIS — N486 Induration penis plastica: Secondary | ICD-10-CM

## 2020-06-28 DIAGNOSIS — J45909 Unspecified asthma, uncomplicated: Secondary | ICD-10-CM | POA: Diagnosis not present

## 2020-06-28 DIAGNOSIS — R079 Chest pain, unspecified: Secondary | ICD-10-CM | POA: Diagnosis present

## 2020-06-28 LAB — BASIC METABOLIC PANEL
Anion gap: 13 (ref 5–15)
BUN: 16 mg/dL (ref 8–23)
CO2: 23 mmol/L (ref 22–32)
Calcium: 9.5 mg/dL (ref 8.9–10.3)
Chloride: 101 mmol/L (ref 98–111)
Creatinine, Ser: 0.9 mg/dL (ref 0.61–1.24)
GFR, Estimated: >60 mL/min (ref >60)
Glucose, Bld: 328 mg/dL — ABNORMAL HIGH (ref 70–99)
Potassium: 4.2 mmol/L (ref 3.5–5.1)
Sodium: 137 mmol/L (ref 135–145)

## 2020-06-28 LAB — CBC
HCT: 45.4 % (ref 39.0–52.0)
Hemoglobin: 15 g/dL (ref 13.0–17.0)
MCH: 28.2 pg (ref 26.0–34.0)
MCHC: 33 g/dL (ref 30.0–36.0)
MCV: 85.5 fL (ref 80.0–100.0)
Platelets: 134 10*3/uL — ABNORMAL LOW (ref 150–400)
RBC: 5.31 MIL/uL (ref 4.22–5.81)
RDW: 12.3 % (ref 11.5–15.5)
WBC: 8.9 10*3/uL (ref 4.0–10.5)
nRBC: 0 % (ref 0.0–0.2)

## 2020-06-28 LAB — TROPONIN I (HIGH SENSITIVITY)
Troponin I (High Sensitivity): 8 ng/L (ref <18)
Troponin I (High Sensitivity): 8 ng/L (ref <18)

## 2020-06-28 MED ORDER — IOHEXOL 350 MG/ML SOLN
75.0000 mL | Freq: Once | INTRAVENOUS | Status: AC | PRN
  Administered 2020-06-28: 75 mL via INTRAVENOUS

## 2020-06-28 NOTE — Progress Notes (Signed)
06/28/2020 8:30 AM   Kyle [name] Sep 13, 1960 [id number]  Referring provider: Center, [facility] [address] [place],  [place] [address]  Chief Complaint  Patient presents with  . Abnormal Penile Curvature    HPI: 60 y.o. male presents for follow-up of Peyronie's disease.   Initially seen 03/27/2020; refer to note for details  ~ 1 month after that visit he noted progression of his ED and currently states he had complete loss of erections and is unable to determine penile curvature or pain  Was not a candidate for PDE 5 med due to chronic nitrate therapy   PMH: Past Medical History:  Diagnosis Date  . Allergy   . Asthma   . Chronic back pain 1981   a.followed by pain clinic and neurosurgery.  Marland Kitchen COPD (chronic obstructive pulmonary disease) ([place])   . Coronary artery disease involving native coronary artery    a. 10/2012 CABG x 4 @ [facility] (LIMA->LAD, VG->RI, VG->OM, VG->PDA);  b. 08/2014 Lexiscan MV: small defect of mild severity present along apical lateral & apex location, no st segment changes, LV function 45-54%, poor gated images lead to poor EF & wall motion assessment, low risk study; c. 02/2019 MV: EF 53%, apical defect (atten vs scar), No ischemia-->low risk.  . DDD (degenerative disc disease)   . Diastolic dysfunction    a. 02/2019 Echo: EF 60-65%, mod LVH. Gr2 DD. Nl RV fxn. Unable to exclude bicuspid AoV.  . Dilated aortic root ([place])    a. 02/2019 Echo: Ao root 28mm, Asc Ao 20mm, Ao arch 77mm.  Marland Kitchen DM2 (diabetes mellitus, type 2) ([place])   . Fainting spell   . GERD (gastroesophageal reflux disease)   . Hepatitis C    2/2 tattoo  . History of medication noncompliance   . Hyperlipidemia with target LDL less than 70   . Hypertension   . Polysubstance abuse ([place])    a. ongoing daily tobacco abuse and etoh (on the weekends), remote crack/cocaine abuse last used approximately in 2006  . S/P CABG x 4    a. LIMA-LAD, SVG-Ramus, SVG-OM,SVG-dRCA  . Urine  incontinence     Surgical History: Past Surgical History:  Procedure Laterality Date  . CARDIAC CATHETERIZATION  11/2012   [facility] - Multivessel CAD  . CORONARY ARTERY BYPASS GRAFT  8/14   CABG x 4; LIMA-LAD, SVG-RI, SVG-OM1, SVG-rPDA (Atretic RIMA).  . neck injection      Home Medications:  Allergies as of 06/28/2020      Reactions   Trazodone And Nefazodone    Reaction: GI upset   Amoxicillin Itching   Cyclobenzaprine Itching, Rash   rash rash   Methadone Itching   Reaction: Gi upset Reaction: Gi upset   Penicillin G Itching   Propoxyphene Hives   Tramadol Nausea And Vomiting   Trazodone Nausea And Vomiting   Reaction: GI upset Reaction: GI upset      Medication List       Accurate as of June 28, 2020 11:59 PM. If you have any questions, ask your nurse or doctor.        aspirin EC 81 MG tablet Take 1 tablet (81 mg total) by mouth daily.   Cetirizine HCl 10 MG Caps Take 1 capsule (10 mg total) by mouth daily.   insulin glargine 100 UNIT/ML injection Commonly known as: LANTUS Inject 30 Units into the skin 2 (two) times daily.   isosorbide mononitrate 30 MG 24 hr tablet Commonly known  as: Imdur Take 1 tablet (30 mg total) by mouth daily.   metoprolol succinate 25 MG 24 hr tablet Commonly known as: TOPROL-XL Take 2 tablets (50 mg total) by mouth daily.   nitroGLYCERIN 0.4 MG SL tablet Commonly known as: NITROSTAT Place 1 tablet (0.4 mg total) under the tongue [name] 5 (five) minutes as needed for chest pain.       Allergies:  Allergies  Allergen Reactions  . Trazodone And Nefazodone     Reaction: GI upset  . Amoxicillin Itching  . Cyclobenzaprine Itching and Rash    rash rash  . Methadone Itching    Reaction: Gi upset Reaction: Gi upset  . Penicillin G Itching  . Propoxyphene Hives  . Tramadol Nausea And Vomiting  . Trazodone Nausea And Vomiting    Reaction: GI upset Reaction: GI upset    Family History: Family History   Problem Relation Age of Onset  . Heart disease Mother   . Heart disease Father   . Hypertension Father   . Diabetes Sister   . Cancer Sister        skin cancer  . Diabetes Other   . Heart disease Other     Social History:  reports that he has been smoking cigarettes. He has a 8.75 pack-year smoking history. He has never used smokeless tobacco. He reports current alcohol use. He reports that he does not use drugs.   Physical Exam: BP 124/84   Pulse (!) 122   Ht 5\' 7"  (1.702 m)   Wt 149 lb (67.6 kg)   BMI 23.34 kg/m   Constitutional:  Alert and oriented, No acute distress.   Assessment & Plan:    1.  Peyronie's disease  Since last visit has noted complete loss of erection and unable to determine curvature  He does have significant medical comorbidities but is interested in Peyronie's surgery with penile implant and will refer to [facility]  2.  Erectile dysfunction  As above   Abbie Sons, MD  [facility] [address] [place],  [address] [phone]

## 2020-06-28 NOTE — ED Provider Notes (Signed)
[facility] Emergency Department Provider Note   ____________________________________________    I have reviewed the triage vital signs and the nursing notes.   HISTORY  Chief Complaint Chest Pain     HPI Kyle [name] is a 60 y.o. male who presents with complaints of chest pain.  Patient has extensive history of CAD, COPD as well as a dilated aortic root as well as GERD.  He notes several days of intermittent chest discomfort which has been mild.  It does not seem to be exertional in nature.  He does follow with [facility] cardiology.  Currently he is feeling well, he thinks it may be related to acid reflux.  Denies shortness of breath, no calf pain or swelling.  No nausea vomiting or diaphoresis.  Occasionally takes his nitroglycerin as needed  Past Medical History:  Diagnosis Date  . Allergy   . Asthma   . Chronic back pain 1981   a.followed by pain clinic and neurosurgery.  Marland Kitchen COPD (chronic obstructive pulmonary disease) ([place])   . Coronary artery disease involving native coronary artery    a. 10/2012 CABG x 4 @ [facility] (LIMA->LAD, VG->RI, VG->OM, VG->PDA);  b. 08/2014 Lexiscan MV: small defect of mild severity present along apical lateral & apex location, no st segment changes, LV function 45-54%, poor gated images lead to poor EF & wall motion assessment, low risk study; c. 02/2019 MV: EF 53%, apical defect (atten vs scar), No ischemia-->low risk.  . DDD (degenerative disc disease)   . Diastolic dysfunction    a. 02/2019 Echo: EF 60-65%, mod LVH. Gr2 DD. Nl RV fxn. Unable to exclude bicuspid AoV.  . Dilated aortic root ([place])    a. 02/2019 Echo: Ao root 66mm, Asc Ao 54mm, Ao arch 4mm.  Marland Kitchen DM2 (diabetes mellitus, type 2) ([place])   . Fainting spell   . GERD (gastroesophageal reflux disease)   . Hepatitis C    2/2 tattoo  . History of medication noncompliance   . Hyperlipidemia with target LDL less than 70   . Hypertension   . Polysubstance abuse  ([place])    a. ongoing daily tobacco abuse and etoh (on the weekends), remote crack/cocaine abuse last used approximately in 2006  . S/P CABG x 4    a. LIMA-LAD, SVG-Ramus, SVG-OM,SVG-dRCA  . Urine incontinence     Patient Active Problem List   Diagnosis Date Noted  . Peyronie's disease 12/19/2019  . Erectile dysfunction 12/19/2019  . Ascending aorta dilatation (HCC) 09/06/2018  . Diabetes mellitus type 2 with complications ([place]) 92/02/9416  . Tobacco abuse 09/06/2018  . DDD (degenerative disc disease), lumbar 01/10/2015  . Facet syndrome, lumbar 01/10/2015  . Lumbar radiculopathy 01/10/2015  . DDD (degenerative disc disease), cervical 11/22/2014  . Cervical facet syndrome 11/22/2014  . Bilateral occipital neuralgia 11/22/2014  . Hospital discharge follow-up 10/16/2014  . Atherosclerosis of native coronary artery with stable angina pectoris ([place])   . S/P CABG x 4   . Chest pain with moderate risk for cardiac etiology 09/27/2014  . Cervicalgia 07/05/2014  . Acute bacterial rhinosinusitis 06/15/2014  . Non compliance w medication regimen 06/07/2014  . Chest tightness 06/07/2014  . Tobacco abuse counseling 01/05/2014  . Vitamin D deficiency 01/05/2014  . Transaminitis 01/05/2014  . Temporary low platelet count ([place]) 01/05/2014  . Diabetes mellitus type 2, uncontrolled ([place]) 01/05/2014  . Back pain 10/01/2013  . Need for prophylactic vaccination with tetanus-diphtheria (Td) 10/01/2013  . Penile lesion 10/01/2013  .  Essential hypertension 08/29/2013  . Hyperlipidemia with target LDL less than 70 08/29/2013  . Callus of foot 08/29/2013  . Blurred vision 08/29/2013  . Itching 08/29/2013  . Exacerbation of chronic back pain 06/07/2013  . Hyperlipidemia 03/02/2013  . Former smoker 03/02/2013  . Routine general medical examination at a health care facility 02/24/2013  . Hepatitis C 02/24/2013  . Diabetes type 2, uncontrolled ([place]) 02/24/2013    Past Surgical History:  Procedure  Laterality Date  . CARDIAC CATHETERIZATION  11/2012   [facility] - Multivessel CAD  . CORONARY ARTERY BYPASS GRAFT  8/14   CABG x 4; LIMA-LAD, SVG-RI, SVG-OM1, SVG-rPDA (Atretic RIMA).  . neck injection      Prior to Admission medications   Medication Sig Start Date End Date Taking? Authorizing Provider  aspirin EC 81 MG tablet Take 1 tablet (81 mg total) by mouth daily. 05/17/16   Minna Merritts, MD  Cetirizine HCl 10 MG CAPS Take 1 capsule (10 mg total) by mouth daily. 12/20/18   Triplett, Cari B, FNP  insulin glargine (LANTUS) 100 UNIT/ML injection Inject 30 Units into the skin 2 (two) times daily.    [provider]  isosorbide mononitrate (IMDUR) 30 MG 24 hr tablet Take 1 tablet (30 mg total) by mouth daily. 06/08/18   Rise Mu, PA-C  metoprolol succinate (TOPROL-XL) 25 MG 24 hr tablet Take 2 tablets (50 mg total) by mouth daily. 06/08/18   Dunn, Areta Haber, PA-C  nitroGLYCERIN (NITROSTAT) 0.4 MG SL tablet Place 1 tablet (0.4 mg total) under the tongue every 5 (five) minutes as needed for chest pain. 09/29/14   Aldean Jewett, MD     Allergies Trazodone and nefazodone, Amoxicillin, Cyclobenzaprine, Methadone, Penicillin g, Propoxyphene, Tramadol, and Trazodone  Family History  Problem Relation Age of Onset  . Heart disease Mother   . Heart disease Father   . Hypertension Father   . Diabetes Sister   . Cancer Sister        skin cancer  . Diabetes Other   . Heart disease Other     Social History Social History   Tobacco Use  . Smoking status: Current Every Day Smoker    Packs/day: 0.25    Years: 35.00    Pack years: 8.75    Types: Cigarettes  . Smokeless tobacco: Never Used  . Tobacco comment: a pack lasts 2 to 4 days   Vaping Use  . Vaping Use: Never used  Substance Use Topics  . Alcohol use: Yes    Alcohol/week: 0.0 standard drinks    Comment: 2-3 beers/week  . Drug use: No    Review of Systems  Constitutional: No fever/chills Eyes: No  visual changes.  ENT: No sore throat. Cardiovascular: As above Respiratory: Denies shortness of breath. Gastrointestinal: No abdominal pain.  No nausea, no vomiting.   Genitourinary: Negative for dysuria. Musculoskeletal: Negative for back pain. Skin: Negative for rash. Neurological: Negative for headaches    ____________________________________________   PHYSICAL EXAM:  VITAL SIGNS: ED Triage Vitals  Enc Vitals Group     BP 06/28/20 1222 139/86     Pulse Rate 06/28/20 1222 (!) 122     Resp 06/28/20 1222 18     Temp 06/28/20 1222 98.4 F (36.9 C)     Temp Source 06/28/20 1222 Oral     SpO2 06/28/20 1414 100 %     Weight 06/28/20 1222 67.6 kg (149 lb)     Height 06/28/20 1222  1.702 m (5\' 7" )     Head Circumference --      Peak Flow --      Pain Score 06/28/20 1221 7     Pain Loc --      Pain Edu? --      Excl. in [place]? --     Constitutional: Alert and oriented.  Eyes: Conjunctivae are normal.  Head: Atraumatic. Nose: No congestion/rhinnorhea. Mouth/Throat: Mucous membranes are moist.   Neck:  Painless ROM Cardiovascular: Mild, regular rhythm. Grossly normal heart sounds.  Good peripheral circulation. Respiratory: Normal respiratory effort.  No retractions. Lungs CTAB. Gastrointestinal: Soft and nontender. No distention.  No CVA tenderness.  Musculoskeletal: No lower extremity tenderness nor edema.  Warm and well perfused Neurologic:  Normal speech and language. No gross focal neurologic deficits are appreciated.  Skin:  Skin is warm, dry and intact. No rash noted. Psychiatric: Mood and affect are normal. Speech and behavior are normal.  ____________________________________________   LABS (all labs ordered are listed, but only abnormal results are displayed)  Labs Reviewed  BASIC METABOLIC PANEL - Abnormal; Notable for the following components:      Result Value   Glucose, Bld 328 (*)    All other components within normal limits  CBC - Abnormal; Notable for  the following components:   Platelets 134 (*)    All other components within normal limits  TROPONIN I (HIGH SENSITIVITY)  TROPONIN I (HIGH SENSITIVITY)   ____________________________________________  EKG  ED ECG REPORT I, Lavonia Drafts, the attending physician, personally viewed and interpreted this ECG.  Date: 06/28/2020  Rhythm: Sinus tachycardia QRS Axis: normal Intervals: Bifascicular block ST/T Wave abnormalities: Nonspecific changes Narrative Interpretation: no evidence of acute ischemia  ____________________________________________  RADIOLOGY  X-ray reviewed by me, no acute abnormality CT angiography reviewed by me, no evidence of dissection ____________________________________________   PROCEDURES  Procedure(s) performed: No  Procedures   Critical Care performed: No ____________________________________________   INITIAL IMPRESSION / ASSESSMENT AND PLAN / ED COURSE  Pertinent labs & imaging results that were available during my care of the patient were reviewed by me and considered in my medical decision making (see chart for details).  Patient presents with chest discomfort as above, history of significant CAD including CABG.  He reports his discomfort is intermittent not exertional and overall mild.  Currently he is comfortable in the emergency department but decided today to come get it checked out.  Review of records demonstrates that he saw his cardiologist's on 04/12/2020 had echo done shortly thereafter and recommendation for a CT angiography given dilated aortic root.  He has not had the CT angiography done  Patient mildly tachycardic upon arrival, initial high sensitive troponin is normal.  CT angiography negative for dissection  Delta troponin is unchanged.  Patient is comfortable and heart rate has improved without intervention.  Discussed with Dr. Garen Lah of [facility] cardiology who agrees with discharge and close follow-up in the office,  discussed with patient who agrees with plan, strict return precautions discussed,    ____________________________________________   FINAL CLINICAL IMPRESSION(S) / ED DIAGNOSES  Final diagnoses:  Atypical chest pain        Note:  This document was prepared using Dragon voice recognition software and may include unintentional dictation errors.   Lavonia Drafts, MD 06/28/20 [phone]

## 2020-06-28 NOTE — ED Notes (Signed)
Patient refused DC VS. Patient refused to sign consent for DC. Patient allowed this RN to remove IV and left promptly after.

## 2020-06-28 NOTE — ED Triage Notes (Signed)
Pt comes into the ED via POV c/o central chest pain that has been ongoing for several days. PT has cardiac h/o open heart surgery and 7 stents being placed.  Pt states he has some SHOB that comes with the chest pain but denies any N/V/ or dizziness.  Pt ambulatory to triage and in NAD with even and unlabored respirations.

## 2020-06-29 ENCOUNTER — Encounter: Payer: Self-pay | Admitting: Urology

## 2020-07-10 ENCOUNTER — Ambulatory Visit: Payer: Medicare PPO | Admitting: Cardiovascular Disease

## 2020-08-11 NOTE — Progress Notes (Deleted)
Evaluation Performed:  Follow-up visit  Date:  08/11/2020   ID:  Natalio, Salois 10-Sep-1960, MRN [id number]  Patient Location:  [place] [address]   Provider location:   [facility] Captain, [employer] office  PCP:  [place]  Cardiologist:  Rockey Situ, [facility]   No chief complaint on file.    History of Present Illness:    Kyle [name] is a 60 y.o. male   Patient has a past medical history of poorly controlled diabetes, long history of smoking who continues to smoke,  hyperlipidemia,  coronary artery disease, bypass surgery.  4-vessel CABG (LIMA-LAD, SVG-RI, SVG-OM1, SVG-rPDA (atretic RIMA)) in 10/2012 at the [facility]  He presents for routine followup of his coronary artery disease   in the past has run out of his medications  Not sleeping well, 2 hours at a time, "body wakes up" Tired,   Reports having occasional chest tightness He continues to smoke, 1 pack per week  Gets meds from the [place]  In the ER 02/2019,.  Details reviewed with him Generalized Body Aches  mild burning pain in the center of his chest that has also been going on for 3 weeks  -burning pain in his lower extremities, Metformin in the past few weeks because he ran out.  EKG personally reviewed by myself on todays visit Shows normal sinus rhythm/sinus tachycardia rate 103 bpm right bundle branch block left anterior fascicular block  Seen by Thurmond Butts February 2020 out of Imdur, Toprol, and Crestor for 6 to 8 months  weight is down 21 pounds from his office visit in 04/2016 (180--> 159 pounds).  Previous HBA1C 12.3  Other past medical history reviewed Previously had worsening shortness of breath, fatigue, slight chest pain prior to his  bypass surgery. He had four-vessel bypass in July 2014 at the [facility] in [place]. Hospital is called the [place].     Prior CV studies:   The following studies were reviewed today:   08/2014 with  chest pain and ruled out. Lexiscan Myoview was low risk with an EF of 45-54%.   07/2015,  EF of 60-65%, mild concentric LVH, no RWMA, normal LV diastolic function, trivial AI, mildly dilated aortic root, mildly dilated ascending aorta measuring 3.9 cm, normal size left atrium, RVSF normal, PASP normal.   Past Medical History:  Diagnosis Date  . Allergy   . Asthma   . Chronic back pain 1981   a.followed by pain clinic and neurosurgery.  Marland Kitchen COPD (chronic obstructive pulmonary disease) ([place])   . Coronary artery disease involving native coronary artery    a. 10/2012 CABG x 4 @ [facility] (LIMA->LAD, VG->RI, VG->OM, VG->PDA);  b. 08/2014 Lexiscan MV: small defect of mild severity present along apical lateral & apex location, no st segment changes, LV function 45-54%, poor gated images lead to poor EF & wall motion assessment, low risk study; c. 02/2019 MV: EF 53%, apical defect (atten vs scar), No ischemia-->low risk.  . DDD (degenerative disc disease)   . Diastolic dysfunction    a. 02/2019 Echo: EF 60-65%, mod LVH. Gr2 DD. Nl RV fxn. Unable to exclude bicuspid AoV.  . Dilated aortic root ([place])    a. 02/2019 Echo: Ao root 81mm, Asc Ao 41mm, Ao arch 25mm.  Marland Kitchen DM2 (diabetes mellitus, type 2) ([place])   . Fainting spell   . GERD (gastroesophageal reflux disease)   . Hepatitis C  2/2 tattoo  . History of medication noncompliance   . Hyperlipidemia with target LDL less than 70   . Hypertension   . Polysubstance abuse ([place])    a. ongoing daily tobacco abuse and etoh (on the weekends), remote crack/cocaine abuse last used approximately in 2006  . S/P CABG x 4    a. LIMA-LAD, SVG-Ramus, SVG-OM,SVG-dRCA  . Urine incontinence    Past Surgical History:  Procedure Laterality Date  . CARDIAC CATHETERIZATION  11/2012   [facility] - Multivessel CAD  . CORONARY ARTERY BYPASS GRAFT  8/14   CABG x 4; LIMA-LAD, SVG-RI, SVG-OM1, SVG-rPDA (Atretic RIMA).  . neck injection       No outpatient  medications have been marked as taking for the 08/14/20 encounter (Appointment) with Minna Merritts, MD.     Allergies:   Trazodone and nefazodone, Amoxicillin, Cyclobenzaprine, Methadone, Penicillin g, Propoxyphene, Tramadol, and Trazodone   Social History   Tobacco Use  . Smoking status: Current Every Day Smoker    Packs/day: 0.25    Years: 35.00    Pack years: 8.75    Types: Cigarettes  . Smokeless tobacco: Never Used  . Tobacco comment: a pack lasts 2 to 4 days   Vaping Use  . Vaping Use: Never used  Substance Use Topics  . Alcohol use: Yes    Alcohol/week: 0.0 standard drinks    Comment: 2-3 beers/week  . Drug use: No     Current Outpatient Medications on File Prior to Visit  Medication Sig Dispense Refill  . aspirin EC 81 MG tablet Take 1 tablet (81 mg total) by mouth daily. 90 tablet 3  . Cetirizine HCl 10 MG CAPS Take 1 capsule (10 mg total) by mouth daily. 30 capsule 3  . insulin glargine (LANTUS) 100 UNIT/ML injection Inject 30 Units into the skin 2 (two) times daily.    . isosorbide mononitrate (IMDUR) 30 MG 24 hr tablet Take 1 tablet (30 mg total) by mouth daily. 90 tablet 1  . metoprolol succinate (TOPROL-XL) 25 MG 24 hr tablet Take 2 tablets (50 mg total) by mouth daily. 180 tablet 1  . nitroGLYCERIN (NITROSTAT) 0.4 MG SL tablet Place 1 tablet (0.4 mg total) under the tongue every 5 (five) minutes as needed for chest pain. 21 tablet 12   No current facility-administered medications on file prior to visit.     Family Hx: The patient's family history includes Cancer in his sister; Diabetes in his sister and another family member; Heart disease in his father, mother, and another family member; Hypertension in his father.  ROS:   Please see the history of present illness.    Review of Systems  Constitutional: Negative.   HENT: Negative.   Respiratory: Positive for shortness of breath.   Cardiovascular: Positive for chest pain.  Gastrointestinal: Negative.    Musculoskeletal: Negative.   Neurological: Negative.   Psychiatric/Behavioral: Negative.   All other systems reviewed and are negative.     Labs/Other Tests and Data Reviewed:    Recent Labs: 06/28/2020: BUN 16; Creatinine, Ser 0.90; Hemoglobin 15.0; Platelets 134; Potassium 4.2; Sodium 137   Recent Lipid Panel Lab Results  Component Value Date/Time   CHOL 156 06/08/2018 11:32 AM   CHOL 131 01/24/2013 04:48 AM   TRIG 212 (H) 06/08/2018 11:32 AM   TRIG 184 01/24/2013 04:48 AM   HDL 36 (L) 06/08/2018 11:32 AM   HDL 24 (L) 01/24/2013 04:48 AM   CHOLHDL 4.3 06/08/2018 11:32 AM  CHOLHDL 4 11/24/2013 09:56 AM   LDLCALC 78 06/08/2018 11:32 AM   LDLCALC 70 01/24/2013 04:48 AM   LDLDIRECT 97 06/08/2018 11:32 AM   LDLDIRECT 74.1 11/24/2013 09:56 AM    Wt Readings from Last 3 Encounters:  06/28/20 149 lb (67.6 kg)  06/28/20 149 lb (67.6 kg)  04/12/20 150 lb 8 oz (68.3 kg)     Exam:    Vital Signs: Vital signs may also be detailed in the HPI There were no vitals taken for this visit. Constitutional:  oriented to person, place, and time. No distress.  HENT:  Head: Grossly normal Eyes:  no discharge. No scleral icterus.  Neck: No JVD, no carotid bruits  Cardiovascular: Regular rate and rhythm, no murmurs appreciated Pulmonary/Chest: Clear to auscultation bilaterally, no wheezes or rails Abdominal: Soft.  no distension.  no tenderness.  Musculoskeletal: Normal range of motion Neurological:  normal muscle tone. Coordination normal. No atrophy Skin: Skin warm and dry Psychiatric: normal affect, pleasant    ASSESSMENT & PLAN:    Atherosclerosis of native coronary artery of native heart with stable angina pectoris (HCC) Stressed importance of medication compliance Reports having some occasional chest burning, recommended we order a stress test Unable to treadmill given shortness of breath symptoms, continues to smoke, will order a pharmacologic study  Dilated aortic root  (HCC) On CT attenuation corrected images for nuclear stress test above we can look at his aorta  Ascending aorta dilatation ([place]) Plan as above  Diabetes mellitus type 2 with complications ([place]) Stressed importance of compliance of his insulin History of noncompliance likely financial Though now gets most of his medications from the [place]  Tobacco abuse Still smoking 1 pack/week Smoking cessation recommended No desire to quit, we have discussed this with him again  S/P CABG x 4 Stress test as above  Mixed hyperlipidemia Stressed compliance with his statin  Disposition: Follow-up in 6 months   Signed, Kyle Rogue, MD  08/11/2020 6:42 PM    [place] Office [id number], [place], [place] [address]

## 2020-08-14 ENCOUNTER — Ambulatory Visit: Payer: Medicare (Managed Care) | Admitting: Cardiovascular Disease

## 2020-08-14 DIAGNOSIS — I25118 Atherosclerotic heart disease of native coronary artery with other forms of angina pectoris: Secondary | ICD-10-CM

## 2020-08-14 DIAGNOSIS — I77810 Thoracic aortic ectasia: Secondary | ICD-10-CM

## 2020-08-14 DIAGNOSIS — E785 Hyperlipidemia, unspecified: Secondary | ICD-10-CM

## 2020-08-14 DIAGNOSIS — Z720 Tobacco use: Secondary | ICD-10-CM

## 2020-08-14 DIAGNOSIS — I10 Essential (primary) hypertension: Secondary | ICD-10-CM

## 2020-08-14 DIAGNOSIS — Z951 Presence of aortocoronary bypass graft: Secondary | ICD-10-CM

## 2020-08-15 NOTE — Progress Notes (Deleted)
Evaluation Performed:  Follow-up visit  Date:  08/15/2020   ID:  Kyle [name], Kyle [name] 11-26-1960, MRN [id number]  Patient Location:  [employer] [place] [address]   Provider location:   [facility], [employer] office  PCP:  [place]  Cardiologist:  Rockey Situ, [facility]   No chief complaint on file.    History of Present Illness:    Kyle [name] is a 60 y.o. male   Patient has a past medical history of poorly controlled diabetes, long history of smoking who continues to smoke,  hyperlipidemia,  coronary artery disease, bypass surgery.  4-vessel CABG (LIMA-LAD, SVG-RI, SVG-OM1, SVG-rPDA (atretic RIMA)) in 10/2012 at the [facility]  He presents for routine followup of his coronary artery disease   in the past has run out of his medications  Not sleeping well, 2 hours at a time, "body wakes up" Tired,   Reports having occasional chest tightness He continues to smoke, 1 pack per week  Gets meds from the [place]  In the ER 02/2019,.  Details reviewed with him Generalized Body Aches  mild burning pain in the center of his chest that has also been going on for 3 weeks  -burning pain in his lower extremities, Metformin in the past few weeks because he ran out.  EKG personally reviewed by myself on todays visit Shows normal sinus rhythm/sinus tachycardia rate 103 bpm right bundle branch block left anterior fascicular block  Seen by Thurmond Butts February 2020 out of Imdur, Toprol, and Crestor for 6 to 8 months  weight is down 21 pounds from his office visit in 04/2016 (180--> 159 pounds).  Previous HBA1C 12.3  Other past medical history reviewed Previously had worsening shortness of breath, fatigue, slight chest pain prior to his  bypass surgery. He had four-vessel bypass in July 2014 at the [facility] in [place]. Hospital is called the [place].     Prior CV studies:   The following studies were reviewed today:   08/2014 with  chest pain and ruled out. Lexiscan Myoview was low risk with an EF of 45-54%.   07/2015,  EF of 60-65%, mild concentric LVH, no RWMA, normal LV diastolic function, trivial AI, mildly dilated aortic root, mildly dilated ascending aorta measuring 3.9 cm, normal size left atrium, RVSF normal, PASP normal.   Past Medical History:  Diagnosis Date  . Allergy   . Asthma   . Chronic back pain 1981   a.followed by pain clinic and neurosurgery.  Marland Kitchen COPD (chronic obstructive pulmonary disease) ([place])   . Coronary artery disease involving native coronary artery    a. 10/2012 CABG x 4 @ [facility] (LIMA->LAD, VG->RI, VG->OM, VG->PDA);  b. 08/2014 Lexiscan MV: small defect of mild severity present along apical lateral & apex location, no st segment changes, LV function 45-54%, poor gated images lead to poor EF & wall motion assessment, low risk study; c. 02/2019 MV: EF 53%, apical defect (atten vs scar), No ischemia-->low risk.  . DDD (degenerative disc disease)   . Diastolic dysfunction    a. 02/2019 Echo: EF 60-65%, mod LVH. Gr2 DD. Nl RV fxn. Unable to exclude bicuspid AoV.  . Dilated aortic root ([place])    a. 02/2019 Echo: Ao root 17mm, Asc Ao 47mm, Ao arch 29mm.  Marland Kitchen DM2 (diabetes mellitus, type 2) ([place])   . Fainting spell   . GERD (gastroesophageal reflux disease)   . Hepatitis C  2/2 tattoo  . History of medication noncompliance   . Hyperlipidemia with target LDL less than 70   . Hypertension   . Polysubstance abuse ([place])    a. ongoing daily tobacco abuse and etoh (on the weekends), remote crack/cocaine abuse last used approximately in 2006  . S/P CABG x 4    a. LIMA-LAD, SVG-Ramus, SVG-OM,SVG-dRCA  . Urine incontinence    Past Surgical History:  Procedure Laterality Date  . CARDIAC CATHETERIZATION  11/2012   [facility] - Multivessel CAD  . CORONARY ARTERY BYPASS GRAFT  8/14   CABG x 4; LIMA-LAD, SVG-RI, SVG-OM1, SVG-rPDA (Atretic RIMA).  . neck injection       No outpatient  medications have been marked as taking for the 08/16/20 encounter (Appointment) with Minna Merritts, MD.     Allergies:   Trazodone and nefazodone, Amoxicillin, Cyclobenzaprine, Methadone, Penicillin g, Propoxyphene, Tramadol, and Trazodone   Social History   Tobacco Use  . Smoking status: Current Every Day Smoker    Packs/day: 0.25    Years: 35.00    Pack years: 8.75    Types: Cigarettes  . Smokeless tobacco: Never Used  . Tobacco comment: a pack lasts 2 to 4 days   Vaping Use  . Vaping Use: Never used  Substance Use Topics  . Alcohol use: Yes    Alcohol/week: 0.0 standard drinks    Comment: 2-3 beers/week  . Drug use: No     Current Outpatient Medications on File Prior to Visit  Medication Sig Dispense Refill  . aspirin EC 81 MG tablet Take 1 tablet (81 mg total) by mouth daily. 90 tablet 3  . Cetirizine HCl 10 MG CAPS Take 1 capsule (10 mg total) by mouth daily. 30 capsule 3  . insulin glargine (LANTUS) 100 UNIT/ML injection Inject 30 Units into the skin 2 (two) times daily.    . isosorbide mononitrate (IMDUR) 30 MG 24 hr tablet Take 1 tablet (30 mg total) by mouth daily. 90 tablet 1  . metoprolol succinate (TOPROL-XL) 25 MG 24 hr tablet Take 2 tablets (50 mg total) by mouth daily. 180 tablet 1  . nitroGLYCERIN (NITROSTAT) 0.4 MG SL tablet Place 1 tablet (0.4 mg total) under the tongue every 5 (five) minutes as needed for chest pain. 21 tablet 12   No current facility-administered medications on file prior to visit.     Family Hx: The patient's family history includes Cancer in his sister; Diabetes in his sister and another family member; Heart disease in his father, mother, and another family member; Hypertension in his father.  ROS:   Please see the history of present illness.    Review of Systems  Constitutional: Negative.   HENT: Negative.   Respiratory: Positive for shortness of breath.   Cardiovascular: Positive for chest pain.  Gastrointestinal: Negative.    Musculoskeletal: Negative.   Neurological: Negative.   Psychiatric/Behavioral: Negative.   All other systems reviewed and are negative.     Labs/Other Tests and Data Reviewed:    Recent Labs: 06/28/2020: BUN 16; Creatinine, Ser 0.90; Hemoglobin 15.0; Platelets 134; Potassium 4.2; Sodium 137   Recent Lipid Panel Lab Results  Component Value Date/Time   CHOL 156 06/08/2018 11:32 AM   CHOL 131 01/24/2013 04:48 AM   TRIG 212 (H) 06/08/2018 11:32 AM   TRIG 184 01/24/2013 04:48 AM   HDL 36 (L) 06/08/2018 11:32 AM   HDL 24 (L) 01/24/2013 04:48 AM   CHOLHDL 4.3 06/08/2018 11:32 AM  CHOLHDL 4 11/24/2013 09:56 AM   LDLCALC 78 06/08/2018 11:32 AM   LDLCALC 70 01/24/2013 04:48 AM   LDLDIRECT 97 06/08/2018 11:32 AM   LDLDIRECT 74.1 11/24/2013 09:56 AM    Wt Readings from Last 3 Encounters:  06/28/20 149 lb (67.6 kg)  06/28/20 149 lb (67.6 kg)  04/12/20 150 lb 8 oz (68.3 kg)     Exam:    Vital Signs: Vital signs may also be detailed in the HPI There were no vitals taken for this visit. Constitutional:  oriented to person, place, and time. No distress.  HENT:  Head: Grossly normal Eyes:  no discharge. No scleral icterus.  Neck: No JVD, no carotid bruits  Cardiovascular: Regular rate and rhythm, no murmurs appreciated Pulmonary/Chest: Clear to auscultation bilaterally, no wheezes or rails Abdominal: Soft.  no distension.  no tenderness.  Musculoskeletal: Normal range of motion Neurological:  normal muscle tone. Coordination normal. No atrophy Skin: Skin warm and dry Psychiatric: normal affect, pleasant    ASSESSMENT & PLAN:    Atherosclerosis of native coronary artery of native heart with stable angina pectoris (HCC) Stressed importance of medication compliance Reports having some occasional chest burning, recommended we order a stress test Unable to treadmill given shortness of breath symptoms, continues to smoke, will order a pharmacologic study  Dilated aortic root  (HCC) On CT attenuation corrected images for nuclear stress test above we can look at his aorta  Ascending aorta dilatation ([place]) Plan as above  Diabetes mellitus type 2 with complications ([place]) Stressed importance of compliance of his insulin History of noncompliance likely financial Though now gets most of his medications from the [place]  Tobacco abuse Still smoking 1 pack/week Smoking cessation recommended No desire to quit, we have discussed this with him again  S/P CABG x 4 Stress test as above  Mixed hyperlipidemia Stressed compliance with his statin  Disposition: Follow-up in 6 months   Signed, Ida Rogue, MD  08/15/2020 5:48 PM    [place] Office [address], [place], [place] [address]

## 2020-08-16 ENCOUNTER — Ambulatory Visit: Payer: Medicare (Managed Care) | Admitting: Cardiovascular Disease

## 2020-08-16 DIAGNOSIS — I25118 Atherosclerotic heart disease of native coronary artery with other forms of angina pectoris: Secondary | ICD-10-CM

## 2020-08-16 DIAGNOSIS — I10 Essential (primary) hypertension: Secondary | ICD-10-CM

## 2020-08-16 DIAGNOSIS — Z951 Presence of aortocoronary bypass graft: Secondary | ICD-10-CM

## 2020-08-16 DIAGNOSIS — I77810 Thoracic aortic ectasia: Secondary | ICD-10-CM

## 2020-08-16 DIAGNOSIS — E785 Hyperlipidemia, unspecified: Secondary | ICD-10-CM

## 2020-08-16 DIAGNOSIS — Z720 Tobacco use: Secondary | ICD-10-CM

## 2021-01-05 ENCOUNTER — Ambulatory Visit: Payer: Medicare (Managed Care)

## 2021-01-08 ENCOUNTER — Ambulatory Visit: Payer: Medicare (Managed Care)

## 2021-01-09 ENCOUNTER — Ambulatory Visit: Payer: Medicare (Managed Care)

## 2021-04-05 ENCOUNTER — Other Ambulatory Visit: Payer: Self-pay | Admitting: Nurse Practitioner

## 2021-04-05 DIAGNOSIS — R1011 Right upper quadrant pain: Secondary | ICD-10-CM

## 2021-04-12 ENCOUNTER — Other Ambulatory Visit: Payer: Self-pay

## 2021-04-12 ENCOUNTER — Ambulatory Visit
Admission: RE | Admit: 2021-04-12 | Discharge: 2021-04-12 | Disposition: A | Discharge status: Home / Self Care | Payer: Medicare (Managed Care) | Source: Ambulatory Visit | Attending: Nurse Practitioner | Admitting: Nurse Practitioner

## 2021-04-12 DIAGNOSIS — R1011 Right upper quadrant pain: Secondary | ICD-10-CM | POA: Diagnosis present

## 2021-08-20 ENCOUNTER — Emergency Department: Payer: No Typology Code available for payment source

## 2021-08-20 ENCOUNTER — Emergency Department
Admission: EM | Admit: 2021-08-20 | Discharge: 2021-08-20 | Disposition: A | Discharge status: Home / Self Care | Payer: No Typology Code available for payment source | Attending: Emergency Medicine | Admitting: Emergency Medicine

## 2021-08-20 ENCOUNTER — Other Ambulatory Visit: Payer: Self-pay

## 2021-08-20 ENCOUNTER — Encounter: Payer: Self-pay | Admitting: Emergency Medicine

## 2021-08-20 DIAGNOSIS — F17200 Nicotine dependence, unspecified, uncomplicated: Secondary | ICD-10-CM | POA: Insufficient documentation

## 2021-08-20 DIAGNOSIS — R42 Dizziness and giddiness: Secondary | ICD-10-CM | POA: Diagnosis not present

## 2021-08-20 DIAGNOSIS — R0789 Other chest pain: Secondary | ICD-10-CM | POA: Insufficient documentation

## 2021-08-20 DIAGNOSIS — K2900 Acute gastritis without bleeding: Secondary | ICD-10-CM

## 2021-08-20 DIAGNOSIS — E1165 Type 2 diabetes mellitus with hyperglycemia: Secondary | ICD-10-CM | POA: Insufficient documentation

## 2021-08-20 DIAGNOSIS — R7989 Other specified abnormal findings of blood chemistry: Secondary | ICD-10-CM | POA: Insufficient documentation

## 2021-08-20 DIAGNOSIS — R748 Abnormal levels of other serum enzymes: Secondary | ICD-10-CM | POA: Insufficient documentation

## 2021-08-20 DIAGNOSIS — R079 Chest pain, unspecified: Secondary | ICD-10-CM

## 2021-08-20 DIAGNOSIS — R739 Hyperglycemia, unspecified: Secondary | ICD-10-CM

## 2021-08-20 LAB — HEPATIC FUNCTION PANEL
ALT: 83 U/L — ABNORMAL HIGH (ref 0–44)
AST: 45 U/L — ABNORMAL HIGH (ref 15–41)
Albumin: 3.9 g/dL (ref 3.5–5.0)
Alkaline Phosphatase: 66 U/L (ref 38–126)
Bilirubin, Direct: 0.2 mg/dL (ref 0.0–0.2)
Indirect Bilirubin: 0.5 mg/dL (ref 0.3–0.9)
Total Bilirubin: 0.7 mg/dL (ref 0.3–1.2)
Total Protein: 7.3 g/dL (ref 6.5–8.1)

## 2021-08-20 LAB — BASIC METABOLIC PANEL
Anion gap: 10 (ref 5–15)
BUN: 13 mg/dL (ref 8–23)
CO2: 27 mmol/L (ref 22–32)
Calcium: 9.5 mg/dL (ref 8.9–10.3)
Chloride: 97 mmol/L — ABNORMAL LOW (ref 98–111)
Creatinine, Ser: 0.74 mg/dL (ref 0.61–1.24)
GFR, Estimated: >60 mL/min (ref >60)
Glucose, Bld: 348 mg/dL — ABNORMAL HIGH (ref 70–99)
Potassium: 4 mmol/L (ref 3.5–5.1)
Sodium: 134 mmol/L — ABNORMAL LOW (ref 135–145)

## 2021-08-20 LAB — CBC
HCT: 45.3 % (ref 39.0–52.0)
Hemoglobin: 14.4 g/dL (ref 13.0–17.0)
MCH: 27.5 pg (ref 26.0–34.0)
MCHC: 31.8 g/dL (ref 30.0–36.0)
MCV: 86.6 fL (ref 80.0–100.0)
Platelets: 147 10*3/uL — ABNORMAL LOW (ref 150–400)
RBC: 5.23 MIL/uL (ref 4.22–5.81)
RDW: 13 % (ref 11.5–15.5)
WBC: 9.6 10*3/uL (ref 4.0–10.5)
nRBC: 0 % (ref 0.0–0.2)

## 2021-08-20 LAB — URINALYSIS, ROUTINE W REFLEX MICROSCOPIC
Bacteria, UA: NONE SEEN
Bilirubin Urine: NEGATIVE
Glucose, UA: >=500 mg/dL — AB
Hgb urine dipstick: NEGATIVE
Ketones, ur: NEGATIVE mg/dL
Leukocytes,Ua: NEGATIVE
Nitrite: NEGATIVE
Protein, ur: NEGATIVE mg/dL
Specific Gravity, Urine: 1.018 (ref 1.005–1.030)
pH: 5 (ref 5.0–8.0)

## 2021-08-20 LAB — TROPONIN I (HIGH SENSITIVITY)
Troponin I (High Sensitivity): 11 ng/L (ref <18)
Troponin I (High Sensitivity): 9 ng/L (ref <18)

## 2021-08-20 LAB — LIPASE, BLOOD: Lipase: 59 U/L — ABNORMAL HIGH (ref 11–51)

## 2021-08-20 MED ORDER — PANTOPRAZOLE SODIUM 20 MG PO TBEC: 20.0000 mg | DELAYED_RELEASE_TABLET | Freq: Every day | ORAL | 0 refills | Status: DC

## 2021-08-20 MED ORDER — SODIUM CHLORIDE 0.9 % IV BOLUS
1000.0000 mL | Freq: Once | INTRAVENOUS | Status: AC
  Administered 2021-08-20: 1000 mL via INTRAVENOUS

## 2021-08-20 MED ORDER — SUCRALFATE 1 G PO TABS: 1.0000 g | ORAL_TABLET | Freq: Four times a day (QID) | ORAL | 1 refills | Status: DC

## 2021-08-20 MED ORDER — LIDOCAINE VISCOUS HCL 2 % MT SOLN
15.0000 mL | Freq: Once | OROMUCOSAL | Status: AC
  Administered 2021-08-20: 15 mL via ORAL
  Filled 2021-08-20: qty 15

## 2021-08-20 MED ORDER — ALUM & MAG HYDROXIDE-SIMETH 200-200-20 MG/5ML PO SUSP
30.0000 mL | Freq: Once | ORAL | Status: AC
  Administered 2021-08-20: 30 mL via ORAL
  Filled 2021-08-20: qty 30

## 2021-08-20 NOTE — ED Provider Notes (Addendum)
? ?[facility] ?Provider Note ? ? ? Event Date/Time  ? First MD Initiated Contact with Patient 08/20/21 1417   ?  (approximate) ? ? ?History  ? ?Weakness and Chest Pain ? ? ?HPI ? ?Kyle [name] is a 61 y.o. male with hypertension, COPD, coronary disease status post CABG, diabetes who comes in with lightheadedness and nausea.  Patient reports a few days of intermittent chest discomfort, nausea, feeling like he has some acid reflux going up his chest and feeling like he is lightheaded with standing. Reports he has not been taking his medications as prescribed.  He states that they sometimes make him feel bad.  He supposed be on Imdur, Toprol, statin, diabetes medicine.  He does report occasional EtOH use and smoking history but states that he is cut down.  He denies any exertional chest pain, swelling of one leg, significant shortness of breath other than at nighttime when he is supposed to be using his CPAP but he states that his CPAP machine is not working. ? ? ?On review of records patient had an ultrasound done in December 2022 that only showed hepatic steatosis.  No evidence of abdominal aortic aneurysm. ? ? ? ? ?Physical Exam  ? ?Triage Vital Signs: ?ED Triage Vitals  ?Enc Vitals Group  ?   BP 08/20/21 1119 (!) 159/100  ?   Pulse Rate 08/20/21 1119 88  ?   Resp 08/20/21 1119 18  ?   Temp 08/20/21 1119 98 ?F (36.7 ?C)  ?   Temp Source 08/20/21 1119 Oral  ?   SpO2 08/20/21 1119 98 %  ?   Weight 08/20/21 1118 150 lb (68 kg)  ?   Height 08/20/21 1118 '5\' 7"'$  (1.702 m)  ?   Head Circumference --   ?   Peak Flow --   ?   Pain Score 08/20/21 1117 5  ?   Pain Loc --   ?   Pain Edu? --   ?   Excl. in [place]? --   ? ? ?Most recent vital signs: ?Vitals:  ? 08/20/21 1119  ?BP: (!) 159/100  ?Pulse: 88  ?Resp: 18  ?Temp: 98 ?F (36.7 ?C)  ?SpO2: 98%  ? ? ? ?General: Awake, no distress.  ?CV:  Good peripheral perfusion.  Clear lungs ?Resp:  Normal effort.  ?Abd:  No distention.  Soft and  nontender ?Other:  Swelling in the legs.  No calf tenderness. ? ? ?ED Results / Procedures / Treatments  ? ?Labs ?(all labs ordered are listed, but only abnormal results are displayed) ?Labs Reviewed  ?BASIC METABOLIC PANEL - Abnormal; Notable for the following components:  ?    Result Value  ? Sodium 134 (*)   ? Chloride 97 (*)   ? Glucose, Bld 348 (*)   ? All other components within normal limits  ?CBC - Abnormal; Notable for the following components:  ? Platelets 147 (*)   ? All other components within normal limits  ?URINALYSIS, ROUTINE W REFLEX MICROSCOPIC - Abnormal; Notable for the following components:  ? Color, Urine YELLOW (*)   ? APPearance HAZY (*)   ? Glucose, UA >=500 (*)   ? All other components within normal limits  ?CBG MONITORING, ED  ?TROPONIN I (HIGH SENSITIVITY)  ?TROPONIN I (HIGH SENSITIVITY)  ? ? ? ?EKG ? ?My interpretation of EKG: ? ?Sinus rate of 95 with bifascicular block with right bundle branch block and left anterior fascicular block without any T  wave inversions or significant ST elevation ? ?I reviewed prior EKG from March 2022 and it was similar ? ?RADIOLOGY ?I have reviewed the xray personally and see findings of prior CABG but otherwise stable ? ? ?PROCEDURES: ? ?Critical Care performed: No ? ?Procedures ? ? ?MEDICATIONS ORDERED IN ED: ?Medications - No data to display ? ? ?IMPRESSION / MDM / ASSESSMENT AND PLAN / ED COURSE  ?I reviewed the triage vital signs and the nursing notes. ? ?Patient comes in with chest pain.  Cardiac markers ordered evaluate for ACS.  GI cocktail given in case this was actually more related to acid reflux.  Abdomen is soft and nontender low suspicion for acute abdominal pathology.  Has had CT dissection previously and this has been negative and he denies severe pain to suggest this.  Patient's EKG is unchanged from prior.  Initial troponin is negative.  Labs show slightly elevated LFTs that are similar to prior slightly elevated lipase please really not  significantly tender in his upper abdomen at this time.  Patient is hyperglycemic but no evidence of DKA.  Suspect that some of the lightheadedness is from dehydration we will give a liter of fluid. ? ?Patient handed off to oncoming team pending repeat troponin and repeat evaluation ? ?Patient heart score is 3.  But given patient is high risk I discussed the case with Dr. Fletcher Anon to help facilitate close outpatient follow-up which patient is agreeable to.  However he does understand that if he develops worsening pain that he needs to return to the ER for concern for unstable angina at that time.  We also discussed restarting all of his medications.  He does report having them at home.  ? ?Patient's lipase and LFTs are slightly elevated but on repeat evaluation he is got no tenderness in the upper abdomen.  His LFTs are similar to prior.  Has had prior ultrasound showing hepatic steatosis.  Do not feel that this is was causing his discomfort.  Patient does report having more acid reflux the symptoms of burping and wonders if it could be acid reflux.  Will trial GI cocktail. ? ?  Patient does report resolution of symptoms after GI cocktail as I wonder if there could be a component of acid reflux or gastritis associated with it.  We will start him on a PPI and given GI follow-up as well ? ?Patient handed off pending repeat troponin ? ?  ? ? ?FINAL CLINICAL IMPRESSION(S) / ED DIAGNOSES  ? ?Final diagnoses:  ?Chest pain, unspecified type  ?Hyperglycemia  ? ? ? ?Rx / DC Orders  ? ?ED Discharge Orders   ? ?      Ordered  ?  pantoprazole (PROTONIX) 20 MG tablet  Daily       ? 08/20/21 1526  ? ?  ?  ? ?  ? ? ? ?Note:  This document was prepared using Dragon voice recognition software and may include unintentional dictation errors. ?  ?Vanessa McDermitt, MD ?08/20/21 1516 ? ?  ?Vanessa [place], MD ?08/20/21 1529 ? ?

## 2021-08-20 NOTE — Discharge Instructions (Addendum)
Take the acid reducer to help in case this is acid reflux. Call GI to make a follow up.  ?Call cardiology to get follow up ?Turn to the ER if you develop worsening pain in your chest or any other concerns ? ?

## 2021-08-20 NOTE — ED Triage Notes (Signed)
Pt to ED via POV from home. Pt reports lightheaded and nausea x2 days. Pt also reports CP and frequent burping. Pt with HX HTN, COPD, CAD and DM.  ?

## 2021-08-20 NOTE — ED Provider Notes (Signed)
----------------------------------------- ?  5:35 PM on 08/20/2021 ?----------------------------------------- ? ?Patient's labs have resulted largely within normal limits.  CBC is normal.  Chemistry is reassuring besides mild hyperglycemia.  Troponin negative.  Lipase is slightly elevated as well as LFTs slightly elevated largely unchanged from historical values.  Patient does admit to fairly frequent alcohol use.  I discussed with the patient trying to limit or abstain from alcohol for at least 2 to 3 weeks.  We will prescribe sucralfate for the patient and have him follow-up with his doctor.  Discussed typical chest pain return precautions.  Patient agreeable to plan. ?  ?Harvest Dark, MD ?08/20/21 1737 ? ?

## 2021-09-04 ENCOUNTER — Encounter: Payer: Self-pay | Admitting: Nurse Practitioner

## 2021-09-04 ENCOUNTER — Ambulatory Visit (INDEPENDENT_AMBULATORY_CARE_PROVIDER_SITE_OTHER): Payer: Medicare PPO | Admitting: Nurse Practitioner

## 2021-09-04 VITALS — BP 128/76 | HR 105 | Ht 67.0 in | Wt 147.5 lb

## 2021-09-04 DIAGNOSIS — R072 Precordial pain: Secondary | ICD-10-CM

## 2021-09-04 DIAGNOSIS — I77810 Thoracic aortic ectasia: Secondary | ICD-10-CM

## 2021-09-04 DIAGNOSIS — I25110 Atherosclerotic heart disease of native coronary artery with unstable angina pectoris: Secondary | ICD-10-CM

## 2021-09-04 DIAGNOSIS — Z720 Tobacco use: Secondary | ICD-10-CM

## 2021-09-04 DIAGNOSIS — I10 Essential (primary) hypertension: Secondary | ICD-10-CM

## 2021-09-04 DIAGNOSIS — E785 Hyperlipidemia, unspecified: Secondary | ICD-10-CM

## 2021-09-04 DIAGNOSIS — R000 Tachycardia, unspecified: Secondary | ICD-10-CM

## 2021-09-04 NOTE — Patient Instructions (Addendum)
Medication Instructions:  ?- Your physician recommends that you continue on your current medications as directed. Please refer to the Current Medication list given to you today. ? ?*If you need a refill on your cardiac medications before your next appointment, please call your pharmacy* ? ? ?Lab Work: ?- none ordered ? ?If you have labs (blood work) drawn today and your tests are completely normal, you will receive your results only by: ?MyChart Message (if you have MyChart) OR ?A paper copy in the mail ?If you have any lab test that is abnormal or we need to change your treatment, we will call you to review the results. ? ? ?Testing/Procedures: ? ?1) Lexiscan Myoview (Chemical Stress Test/ Cardiac Nuclear Scan) ? ?- Your physician has requested that you have a lexiscan myoview.  ? ?[facility] [place] ? ?Your caregiver has ordered a Stress Test with nuclear imaging. The purpose of this test is to evaluate the blood supply to your heart muscle. This procedure is referred to as a "Non-Invasive Stress Test." This is because other than having an IV started in your vein, nothing is inserted or "invades" your body. Cardiac stress tests are done to find areas of poor blood flow to the heart by determining the extent of coronary artery disease (CAD). Some patients exercise on a treadmill, which naturally increases the blood flow to your heart, while others who are  unable to walk on a treadmill due to physical limitations have a pharmacologic/chemical stress agent called Lexiscan . This medicine will mimic walking on a treadmill by temporarily increasing your coronary blood flow.  ? ?Please note: these test may take anywhere between 2-4 hours to complete ? ?PLEASE REPORT TO [facility] MEDICAL MALL ENTRANCE  ?THE VOLUNTEERS AT THE FIRST DESK WILL DIRECT YOU WHERE TO GO ? ?Date of Procedure:_____________________________________ ? ?Arrival Time for Procedure:______________________________ ? ?Instructions regarding medication:  ? ?__xx__ :  Hold diabetes medication morning of procedure ?all Insulins ? ? ?__xx__:  You may take all of your other regular morning medications the day of your test with enough water to get them down safely ? ?PLEASE NOTIFY THE OFFICE AT LEAST 21 HOURS IN ADVANCE IF YOU ARE UNABLE TO KEEP YOUR APPOINTMENT.  [phone] ?AND  ?PLEASE NOTIFY NUCLEAR MEDICINE AT [facility] AT LEAST 3 HOURS IN ADVANCE IF YOU ARE UNABLE TO KEEP YOUR APPOINTMENT. [phone] ? ?How to prepare for your Myoview test: ? ?Do not eat or drink after midnight ?No caffeine for 24 hours prior to test ?No smoking 24 hours prior to test. ?Your medication may be taken with water.  If your doctor stopped a medication because of this test, do not take that medication. ?Ladies, please do not wear dresses.  Skirts or pants are appropriate. Please wear a short sleeve shirt. ?No perfume, cologne or lotion. ?Wear comfortable walking shoes. No heels! ? ? ? ?Follow-Up: ?At [facility], you and your health needs are our priority.  As part of our continuing mission to provide you with exceptional heart care, we have created designated Provider Care Teams.  These Care Teams include your primary Cardiologist (physician) and Advanced Practice Providers (APPs -  Physician Assistants and Nurse Practitioners) who all work together to provide you with the care you need, when you need it. ? ?We recommend signing up for the patient portal called "MyChart".  Sign up information is provided on this After Visit Summary.  MyChart is used to connect with patients for Virtual Visits (Telemedicine).  Patients are able to view  lab/test results, encounter notes, upcoming appointments, etc.  Non-urgent messages can be sent to your provider as well.   ?To learn more about what you can do with MyChart, go to [email or web address].   ? ?Your next appointment:   ?2-3 week(s) ? ?The format for your next appointment:   ?In Person ? ?Provider:   ?You may see Ida Rogue, MD or one of the  following Advanced Practice Providers on your designated Care Team:   ?Murray Hodgkins, NP ? ? ? ?Other Instructions ?Cardiac Nuclear Scan ?A cardiac nuclear scan is a test that measures blood flow to the heart when a person is resting and when he or she is exercising. The test looks for problems such as: ?Not enough blood reaching a portion of the heart. ?The heart muscle not working normally. ?You may need this test if: ?You have heart disease. ?You have had abnormal lab results. ?You have had heart surgery or a balloon procedure to open up blocked arteries (angioplasty). ?You have chest pain. ?You have shortness of breath. ?In this test, a radioactive dye (tracer) is injected into your bloodstream. After the tracer has traveled to your heart, an imaging device is used to measure how much of the tracer is absorbed by or distributed to various areas of your heart. This procedure is usually done at a hospital and takes 2-4 hours. ?Tell a health care provider about: ?Any allergies you have. ?All medicines you are taking, including vitamins, herbs, eye drops, creams, and over-the-counter medicines. ?Any problems you or family members have had with anesthetic medicines. ?Any blood disorders you have. ?Any surgeries you have had. ?Any medical conditions you have. ?Whether you are pregnant or may be pregnant. ?What are the risks? ?Generally, this is a safe procedure. However, problems may occur, including: ?Serious chest pain and heart attack. This is only a risk if the stress portion of the test is done. ?Rapid heartbeat. ?Sensation of warmth in your chest. This usually passes quickly. ?Allergic reaction to the tracer. ?What happens before the procedure? ?Ask your health care provider about changing or stopping your regular medicines. This is especially important if you are taking diabetes medicines or blood thinners. ?Follow instructions from your health care provider about eating or drinking restrictions. ?Remove  your jewelry on the day of the procedure. ?What happens during the procedure? ?An IV will be inserted into one of your veins. ?Your health care provider will inject a small amount of radioactive tracer through the IV. ?You will wait for 20-40 minutes while the tracer travels through your bloodstream. ?Your heart activity will be monitored with an electrocardiogram (ECG). ?You will lie down on an exam table. ?Images of your heart will be taken for about 15-20 minutes. ?You may also have a stress test. For this test, one of the following may be done: ?You will exercise on a treadmill or stationary bike. While you exercise, your heart's activity will be monitored with an ECG, and your blood pressure will be checked. ?You will be given medicines that will increase blood flow to parts of your heart. This is done if you are unable to exercise. ?When blood flow to your heart has peaked, a tracer will again be injected through the IV. ?After 20-40 minutes, you will get back on the exam table and have more images taken of your heart. ?Depending on the type of tracer used, scans may need to be repeated 3-4 hours later. ?Your IV line will be removed when the procedure is over. ?  The procedure may vary among health care providers and hospitals. ?What happens after the procedure? ?Unless your health care provider tells you otherwise, you may return to your normal schedule, including diet, activities, and medicines. ?Unless your health care provider tells you otherwise, you may increase your fluid intake. This will help to flush the contrast dye from your body. Drink enough fluid to keep your urine pale yellow. ?Ask your health care provider, or the department that is doing the test: ?When will my results be ready? ?How will I get my results? ?Summary ?A cardiac nuclear scan measures the blood flow to the heart when a person is resting and when he or she is exercising. ?Tell your health care provider if you are pregnant. ?Before  the procedure, ask your health care provider about changing or stopping your regular medicines. This is especially important if you are taking diabetes medicines or blood thinners. ?After the procedure, unl

## 2021-09-04 NOTE — Progress Notes (Signed)
? ? ?Office Visit  ?  ?Patient Name: Kyle [name] ?Date of Encounter: 09/04/2021 ? ?Primary Care Provider:  [place] ?Primary Cardiologist:  Kyle Rogue, MD ? ?Chief Complaint  ?  ?61 year old male with a history of CAD status post coronary artery bypass grafting x4 in July 2014, hypertension, hyperlipidemia, diabetes, tobacco abuse, and alcohol abuse, who presents for follow-up related to chest pain. ? ?Past Medical History  ?  ?Past Medical History:  ?Diagnosis Date  ? Allergy   ? Asthma   ? Chronic back pain 1981  ? a.followed by pain clinic and neurosurgery.  ? COPD (chronic obstructive pulmonary disease) ([place])   ? Coronary artery disease involving native coronary artery   ? a. 10/2012 CABG x 4 @ [facility] (LIMA->LAD, VG->RI, VG->OM, VG->PDA);  b. 08/2014 Lexiscan MV: small defect of mild severity present along apical lateral & apex location, no st segment changes, LV function 45-54%, poor gated images lead to poor EF & wall motion assessment, low risk study; c. 02/2019 MV: EF 53%, apical defect (atten vs scar), No ischemia-->low risk.  ? DDD (degenerative disc disease)   ? Diastolic dysfunction   ? a. 02/2019 Echo: EF 60-65%, mod LVH. Gr2 DD. Nl RV fxn. Unable to exclude bicuspid AoV; b. 04/2020 Echo: EF 50-55% anteroseptal HK, GrI DD, nl rV fxn, mild Ao sclerosis.  ? Dilated aortic root ([place])   ? a. 02/2019 Echo: Ao root 60m, Asc Ao 443m Ao arch 2765mb. 04/2020 Echo: Ao root 34m46msc Ao 39mm49m 06/2020 CTA Chest: No Ao aneurysm or dissection.  ? DM2 (diabetes mellitus, type 2) (HCC) [place] Fainting spell   ? GERD (gastroesophageal reflux disease)   ? Hepatitis C   ? 2/2 tattoo  ? History of medication noncompliance   ? Hyperlipidemia with target LDL less than 70   ? Hypertension   ? Polysubstance abuse (HCC) [place] a. ongoing daily tobacco abuse and etoh (on the weekends), remote crack/cocaine abuse last used approximately in 2006  ? S/P CABG x 4   ? a. LIMA-LAD, SVG-Ramus, SVG-OM,SVG-dRCA   ? Urine incontinence   ? ?Past Surgical History:  ?Procedure Laterality Date  ? [facility]  [facility]  ? CORONARY ARTERY BYPASS GRAFT  8/14  ? CABG x 4; LIMA-LAD, SVG-RI, SVG-OM1, SVG-rPDA (Atretic RIMA).  ? neck injection    ? ? ?Allergies ? ?Allergies  ?Allergen Reactions  ? Trazodone And Nefazodone   ?  Reaction: GI upset  ? Amoxicillin Itching  ? Cyclobenzaprine Itching and Rash  ?  rash ?rash  ? Methadone Itching  ?  Reaction: Gi upset ?Reaction: Gi upset  ? Penicillin G Itching  ? Propoxyphene Hives  ? Tramadol Nausea And Vomiting  ? Trazodone Nausea And Vomiting  ?  Reaction: GI upset ?Reaction: GI upset  ? ? ?History of Present Illness  ?  ?60 ye21 old male with the above past medical history including CAD status [facility]  Other history includes hypertension, hyperlipidemia, tobacco abuse, remote crack/cocaine abuse, diabetes, GERD, and COPD.  In November 2020, he was evaluated in clinic with complaints of occasional chest burning.  Stress testing was undertaken and showed a small, fixed apical defect with question of artifact versus scar.  There was no significant ischemia.  CT attenuation images showed severe three-vessel coronary calcification with mild aortic atherosclerosis.  Overall, the  scan was felt to be low risk and medical therapy was recommended.  Echo showed an EF of 60 to 73% grade 2 diastolic dysfunction mild to moderate dilation of the aortic root (44 mm), and ascending aorta (40 mm).  Imaging was unable to exclude a bicuspid aortic valve.  He had a follow-up echo in January 2022 that showed an EF of 50 to 55% with anteroseptal hypokinesis, grade 1 diastolic dysfunction, mild aortic sclerosis, and an aortic root of 45 mm and ascending aorta at 39 mm.  He was advised to have CTA of the chest though this was not immediately performed.  He did have a CT of his chest in the  setting of an ED visit for chest pain in March 2022, at which time radiology noted no evidence of aortic aneurysm or dissection. ? ?Since his last visit in December 2021, Kyle [name] notes that he is done reasonably well.  He significantly cut back on his drinking over the past year, now only drinking 2-3 beers on the weekends.  He continues to smoke about 1 pack of cigarettes a week.  He is not routinely exercising.  Over the past 6 months, he has noted more frequent sharp chest pain, occurring with rest and or activity about 2-3 times per week lasting anywhere from a few seconds to up to 5 minutes, and resolving spontaneously.  Symptoms are sometimes accompanied by dyspnea, diaphoresis, and lightheadedness.  He was seen in the ED in late April due to chest pain, and work-up was unremarkable with normal troponins.  He denies palpitations, PND, orthopnea, dizziness, syncope, edema, or early satiety.  Of note, heart rate is elevated today however, he has yet to take his morning medications, which include metoprolol. ? ?Home Medications  ?  ?Current Outpatient Medications  ?Medication Sig Dispense Refill  ? amLODipine (NORVASC) 10 MG tablet Take 1 tablet by mouth daily.    ? aspirin EC 81 MG tablet Take 1 tablet (81 mg total) by mouth daily. 90 tablet 3  ? atorvastatin (LIPITOR) 40 MG tablet Take 20 mg by mouth daily.    ? busPIRone (BUSPAR) 7.5 MG tablet Take 7.5 mg by mouth 2 (two) times daily.    ? Cetirizine HCl 10 MG CAPS Take 1 capsule (10 mg total) by mouth daily. 30 capsule 3  ? insulin aspart (NOVOLOG) 100 UNIT/ML FlexPen Inject 28 Units into the skin with breakfast, with lunch, and with evening meal.    ? insulin glargine (LANTUS) 100 UNIT/ML injection Inject 30 Units into the skin 2 (two) times daily.    ? isosorbide mononitrate (IMDUR) 30 MG 24 hr tablet Take 1 tablet (30 mg total) by mouth daily. 90 tablet 1  ? lisinopril (ZESTRIL) 40 MG tablet Take 1 tablet by mouth daily.    ? metoprolol succinate  (TOPROL-XL) 25 MG 24 hr tablet Take 2 tablets (50 mg total) by mouth daily. 180 tablet 1  ? nitroGLYCERIN (NITROSTAT) 0.4 MG SL tablet Place 1 tablet (0.4 mg total) under the tongue every 5 (five) minutes as needed for chest pain. 21 tablet 12  ? nortriptyline (PAMELOR) 75 MG capsule Take 75 mg by mouth at bedtime.    ? pantoprazole (PROTONIX) 20 MG tablet Take 1 tablet (20 mg total) by mouth daily for 14 days. 14 tablet 0  ? sucralfate (CARAFATE) 1 g tablet Take 1 tablet (1 g total) by mouth 4 (four) times daily. 120 tablet 1  ? traZODone (DESYREL) 50 MG tablet Take 50  mg by mouth at bedtime.    ? ?No current facility-administered medications for this visit.  ?  ? ?Review of Systems  ?  ?As above, he has been having intermittent rest and exertional sharp chest pain sometimes associated dyspnea, diaphoresis, or lightheadedness.  He denies palpitations, PND, orthopnea, syncope, edema, or early satiety.  All other systems reviewed and are otherwise negative except as noted above. ?  ? ?Physical Exam  ?  ?VS:  BP 128/76   Pulse (!) 105   Ht '5\' 7"'$  (1.702 m)   Wt 147 lb 8 oz (66.9 kg)   SpO2 97%   BMI 23.10 kg/m?  , BMI Body mass index is 23.1 kg/m?. ?    ?Vitals:  ? 09/04/21 1010 09/04/21 1034  ?BP: 140/90 128/76  ?Pulse: (!) 105   ?SpO2: 97%   ?  ?GEN: Well nourished, well developed, in no acute distress. ?HEENT: normal. ?Neck: Supple, no JVD, carotid bruits, or masses. ?Cardiac: RRR, no murmurs, rubs, or gallops. No clubbing, cyanosis, edema.  Radials/DP/PT 2+ and equal bilaterally.  ?Respiratory:  Respirations regular and unlabored, diminished breath sounds bilaterally. ?GI: Soft, nontender, nondistended, BS + x 4. ?MS: no deformity or atrophy. ?Skin: warm and dry, no rash. ?Neuro:  Strength and sensation are intact. ?Psych: Normal affect. ? ?Accessory Clinical Findings  ?  ?ECG personally reviewed by me today -sinus tachycardia, 105, left axis deviation, left anterior fascicular block, right bundle branch  block- no acute changes. ? ?Lab Results  ?Component Value Date  ? WBC 9.6 08/20/2021  ? HGB 14.4 08/20/2021  ? HCT 45.3 08/20/2021  ? MCV 86.6 08/20/2021  ? PLT 147 (L) 08/20/2021  ? ?Lab Results  ?Component Value Date

## 2021-09-07 ENCOUNTER — Encounter: Admission: RE | Admit: 2021-09-07 | Payer: Medicare PPO | Source: Ambulatory Visit

## 2021-09-18 ENCOUNTER — Ambulatory Visit: Payer: PRIVATE HEALTH INSURANCE | Admitting: Nurse Practitioner

## 2021-10-10 ENCOUNTER — Encounter: Admission: RE | Admit: 2021-10-10 | Payer: Medicare PPO | Source: Ambulatory Visit

## 2021-10-16 ENCOUNTER — Ambulatory Visit: Payer: PRIVATE HEALTH INSURANCE | Admitting: Medical

## 2021-10-16 NOTE — Progress Notes (Deleted)
Cardiology Office Note:    Date:  10/16/2021   ID:  Kyle [name] October 02, 1960, MRN [id number]  PCP:  [facility] [place] Cardiologist:  Ida Rogue, MD  [place] Electrophysiologist:  None   Referring MD: Swartz Medic*   Chief Complaint: 1 month follow-up  History of Present Illness:    Kyle [name] is a 61 y.o. male with a hx of CAD s/p CABG x 4 in July 2014, HTN, HLD, diabetes, tobacco abuse, and alcohol abuse who presents for follow-up.    In November 2020, he was evaluated in clinic with complaints of occasional chest burning.  Stress testing was undertaken and showed a small, fixed apical defect with question of artifact versus scar.  There was no significant ischemia.  CT attenuation images showed severe three-vessel coronary calcification with mild aortic atherosclerosis.  Overall, the scan was felt to be low risk and medical therapy was recommended.  Echo showed an EF of 60 to 62% grade 2 diastolic dysfunction mild to moderate dilation of the aortic root (44 mm), and ascending aorta (40 mm).  Imaging was unable to exclude a bicuspid aortic valve.  He had a follow-up echo in January 2022 that showed an EF of 50 to 55% with anteroseptal hypokinesis, grade 1 diastolic dysfunction, mild aortic sclerosis, and an aortic root of 45 mm and ascending aorta at 39 mm.  He was advised to have CTA of the chest though this was not immediately performed.  He did have a CT of his chest in the setting of an ED visit for chest pain in March 2022, at which time radiology noted no evidence of aortic aneurysm or dissection.  HE was seen in March and April in the ED for chest pain with negative work-up.   He was last seen 09/04/21 and was doing well, he had cut back on his drinking, now only 2-3 beers. He reported frequent sharp chest pain. HE was set up for [facility], this has not been performed.   Today,     Past Medical History:  Diagnosis Date    Allergy    Asthma    Chronic back pain 1981   a.followed by pain clinic and neurosurgery.   COPD (chronic obstructive pulmonary disease) (HCC)    Coronary artery disease involving native coronary artery    a. 10/2012 CABG x 4 @ [facility] (LIMA->LAD, VG->RI, VG->OM, VG->PDA);  b. 08/2014 Lexiscan MV: small defect of mild severity present along apical lateral & apex location, no st segment changes, LV function 45-54%, poor gated images lead to poor EF & wall motion assessment, low risk study; c. 02/2019 MV: EF 53%, apical defect (atten vs scar), No ischemia-->low risk.   DDD (degenerative disc disease)    Diastolic dysfunction    a. 02/2019 Echo: EF 60-65%, mod LVH. Gr2 DD. Nl RV fxn. Unable to exclude bicuspid AoV; b. 04/2020 Echo: EF 50-55% anteroseptal HK, GrI DD, nl rV fxn, mild Ao sclerosis.   Dilated aortic root ([place])    a. 02/2019 Echo: Ao root 27m, Asc Ao 474m Ao arch 2756mb. 04/2020 Echo: Ao root 60m54msc Ao 39mm63m 06/2020 CTA Chest: No Ao aneurysm or dissection.   DM2 (diabetes mellitus, type 2) (HCC)    Fainting spell    GERD (gastroesophageal reflux disease)    Hepatitis C    2/2 tattoo   History of medication noncompliance    Hyperlipidemia with target LDL less than  70    Hypertension    Polysubstance abuse ([place])    a. ongoing daily tobacco abuse and etoh (on the weekends), remote crack/cocaine abuse last used approximately in 2006   S/P CABG x 4    a. LIMA-LAD, SVG-Ramus, SVG-OM,SVG-dRCA   Urine incontinence     Past Surgical History:  Procedure Laterality Date   CARDIAC CATHETERIZATION  11/2012   [facility] CAD   CORONARY ARTERY BYPASS GRAFT  8/14   CABG x 4; LIMA-LAD, SVG-RI, SVG-OM1, SVG-rPDA (Atretic RIMA).   neck injection      Current Medications: No outpatient medications have been marked as taking for the 10/16/21 encounter (Appointment) with Kathlen Mody, Deztiny Sarra H, PA-C.     Allergies:   Trazodone and nefazodone, Amoxicillin,  Cyclobenzaprine, Methadone, Penicillin g, Propoxyphene, Tramadol, and Trazodone   Social History   Socioeconomic History   Marital status: Single    Spouse name: Not on file   Number of children: Not on file   Years of education: 14   Highest education level: Not on file  Occupational History   Occupation: Designer, fashion/clothing Work    Comment: Disability  Tobacco Use   Smoking status: Every Day    Packs/day: 0.25    Years: 35.00    Total pack years: 8.75    Types: Cigarettes   Smokeless tobacco: Never   Tobacco comments:    a pack lasts 2 to 4 days   Vaping Use   Vaping Use: Never used  Substance and Sexual Activity   Alcohol use: Yes    Alcohol/week: 0.0 standard drinks of alcohol    Comment: 2-3 beers/week   Drug use: No   Sexual activity: Not on file  Other Topics Concern   Not on file  Social History Narrative   Mr. Tobon grew up in the [place] area. He was in the [employer] for 4 years. When he returned home, he worked in [employer] and [place] up until he had a heart attack. He is currently disabled. Mr. Llorente lives by himself but has excellent support system. He is very close to his family. He is currently taking online courses through [employer]. He is trying to obtain a degree in social services and ministry. His goal is to work with at-risk young adults.      Regular exercise: not recently   Caffeine use: occasionally   Social Determinants of Health   Financial Resource Strain: Not on file  Food Insecurity: Not on file  Transportation Needs: Not on file  Physical Activity: Not on file  Stress: Not on file  Social Connections: Not on file     Family History: The patient's ***family history includes Cancer in his sister; Diabetes in his sister and another family member; Heart disease in his father, mother, and another family member; Hypertension in his father.  ROS:   Please see the history of present illness.    *** All other  systems reviewed and are negative.  EKGs/Labs/Other Studies Reviewed:    The following studies were reviewed today: ***  EKG:  EKG is *** ordered today.  The ekg ordered today demonstrates ***  Recent Labs: 08/20/2021: ALT 83; BUN 13; Creatinine, Ser 0.74; Hemoglobin 14.4; Platelets 147; Potassium 4.0; Sodium 134  Recent Lipid Panel    Component Value Date/Time   CHOL 156 06/08/2018 1132   CHOL 131 01/24/2013 0448   TRIG 212 (H) 06/08/2018 1132   TRIG 184 01/24/2013 0448  HDL 36 (L) 06/08/2018 1132   HDL 24 (L) 01/24/2013 0448   CHOLHDL 4.3 06/08/2018 1132   CHOLHDL 4 11/24/2013 0956   VLDL 47.6 (H) 11/24/2013 0956   VLDL 37 01/24/2013 0448   LDLCALC 78 06/08/2018 1132   LDLCALC 70 01/24/2013 0448   LDLDIRECT 97 06/08/2018 1132   LDLDIRECT 74.1 11/24/2013 0956     Risk Assessment/Calculations:   {Does this patient have ATRIAL FIBRILLATION?:[phone]}   Physical Exam:    VS:  There were no vitals taken for this visit.    Wt Readings from Last 3 Encounters:  09/04/21 147 lb 8 oz (66.9 kg)  08/20/21 150 lb (68 kg)  06/28/20 149 lb (67.6 kg)     GEN: *** Well nourished, well developed in no acute distress HEENT: Normal NECK: No JVD; No carotid bruits LYMPHATICS: No lymphadenopathy CARDIAC: ***RRR, no murmurs, rubs, gallops RESPIRATORY:  Clear to auscultation without rales, wheezing or rhonchi  ABDOMEN: Soft, non-tender, non-distended MUSCULOSKELETAL:  No edema; No deformity  SKIN: Warm and dry NEUROLOGIC:  Alert and oriented x 3 PSYCHIATRIC:  Normal affect   ASSESSMENT:    No diagnosis found. PLAN:    In order of problems listed above:  Chest pain CAD s/p CABG x4  HTN  HLD  Tobacco abuse  Alcohol abuse  Dilated aortic root.   Disposition: Follow up {follow up:15908} with ***   Shared Decision Making/Informed Consent   {Are you ordering a CV Procedure (e.g. stress test, cath, DCCV, TEE, etc)?   Press F2        :[id number]}     Signed, Bladen Umar Arlyss Repress  10/16/2021 7:46 AM    [facility]

## 2021-10-17 ENCOUNTER — Encounter: Payer: Self-pay | Admitting: Medical

## 2021-10-19 ENCOUNTER — Other Ambulatory Visit: Payer: Self-pay | Admitting: Nurse Practitioner

## 2021-10-19 DIAGNOSIS — B1920 Unspecified viral hepatitis C without hepatic coma: Secondary | ICD-10-CM

## 2021-10-19 DIAGNOSIS — K7460 Unspecified cirrhosis of liver: Secondary | ICD-10-CM

## 2021-10-31 ENCOUNTER — Ambulatory Visit: Payer: Medicare PPO | Attending: Nurse Practitioner

## 2021-11-06 ENCOUNTER — Telehealth: Payer: Self-pay | Admitting: Nurse Practitioner

## 2021-11-06 NOTE — Telephone Encounter (Signed)
Patient c/o Palpitations:  High priority if patient c/o lightheadedness, shortness of breath, or chest pain  How long have you had palpitations/irregular HR/ Afib? Are you having the symptoms now? Not at this time, fluttering in his chest  Are you currently experiencing lightheadedness, SOB or CP? Been having shortness of breath, chest pain and lightheaded, but not at this time  Do you have a history of afib (atrial fibrillation) or irregular heart rhythm?   Have you checked your BP or HR? (document readings if available):  have not checked it- took it now, it is 167/97 and pulse is 85  Are you experiencing any other symptoms? -no- patient wants to be seen

## 2021-11-06 NOTE — Telephone Encounter (Signed)
I spoke with the patient. He reports that he is having episodes of sharp chest pain that he notices when laying down.  He is having intermittent symptoms of SOB with exertion/ dizziness/ lightheadedness/ nausea. Symptoms are brief in nature.   He is s/p CABG x 4 in 2014. Symptoms are not as severe as they were prior to his CABG.  I advised the patient that he was scheduled for a nuclear stress test at his last office visit with Ignacia Bayley, NP in 5/ 2023, but he no showed for this on 10/10/21.  Per the patient "I have so many appointments, I might have forgot about it."  He confirms he is taking ASA 81 mg once daily. He cannot confirm if he is taking imdur once daily. He has not used his PRN NTG recently.   The patient is currently in no distress. However, I have advised him that I will send to the provider to review and advise if an in office visit is warranted or if we should just try to r/s his Lexiscan Myoview.  The patient is aware we will call him back with provider recommendations once received.  The patient voices understanding and is agreeable.

## 2021-11-06 NOTE — Telephone Encounter (Signed)
Thank you for the follow-up.  I do recommend that we reschedule the lexiscan myoview.

## 2021-11-07 NOTE — Telephone Encounter (Signed)
Called pt advised of provider recommendation to reschedule stress test.  Also, advised pt that it's important to have test completed to help determine source of symptoms.  Message sent to scheduling pool to schedule. Also gave pt the telephone number to office to call in to schedule.  Pt reports is taking Imdur 30 mg PO QD. No further concerns voiced.

## 2021-11-12 ENCOUNTER — Emergency Department
Admission: EM | Admit: 2021-11-12 | Discharge: 2021-11-12 | Disposition: A | Discharge status: Home / Self Care | Payer: No Typology Code available for payment source | Attending: Emergency Medicine | Admitting: Emergency Medicine

## 2021-11-12 ENCOUNTER — Emergency Department: Payer: No Typology Code available for payment source

## 2021-11-12 ENCOUNTER — Other Ambulatory Visit: Payer: Self-pay

## 2021-11-12 DIAGNOSIS — W109XXA Fall (on) (from) unspecified stairs and steps, initial encounter: Secondary | ICD-10-CM | POA: Insufficient documentation

## 2021-11-12 DIAGNOSIS — W108XXA Fall (on) (from) other stairs and steps, initial encounter: Secondary | ICD-10-CM

## 2021-11-12 DIAGNOSIS — R000 Tachycardia, unspecified: Secondary | ICD-10-CM | POA: Diagnosis not present

## 2021-11-12 DIAGNOSIS — S20211A Contusion of right front wall of thorax, initial encounter: Secondary | ICD-10-CM | POA: Insufficient documentation

## 2021-11-12 DIAGNOSIS — S80211A Abrasion, right knee, initial encounter: Secondary | ICD-10-CM | POA: Insufficient documentation

## 2021-11-12 DIAGNOSIS — Y92009 Unspecified place in unspecified non-institutional (private) residence as the place of occurrence of the external cause: Secondary | ICD-10-CM | POA: Diagnosis not present

## 2021-11-12 DIAGNOSIS — S299XXA Unspecified injury of thorax, initial encounter: Secondary | ICD-10-CM | POA: Diagnosis present

## 2021-11-12 NOTE — ED Notes (Signed)
See triage note  Presents with pain to right side of ribs s/p fall on Friday Denies any other complaints   no LOC

## 2021-11-12 NOTE — ED Triage Notes (Signed)
Pt comes with c/o right sided rib pain since falling Friday. Pt states no loc or hitting head. Pt states he also thinks he needs a breathing treatment.

## 2021-11-12 NOTE — ED Provider Notes (Signed)
[facility] Provider Note    Event Date/Time   First MD Initiated Contact with Patient 11/12/21 0900     (approximate)   History   Rib Injury   HPI  Kyle [name] is a 61 y.o. male presents to the ED with complaint of right lateral rib pain after falling 2 days ago on wet outside steps while it was raining.  He denies any head injury or loss of consciousness.  Patient told triage that he thought he needed a breathing treatment as he has not had one in a long time but denies any shortness of breath or known wheezing.     Physical Exam   Triage Vital Signs: ED Triage Vitals  Enc Vitals Group     BP 11/12/21 0849 (!) 157/103     Pulse Rate 11/12/21 0849 (!) 108     Resp 11/12/21 0849 17     Temp 11/12/21 0849 98.3 F (36.8 C)     Temp Source 11/12/21 0849 Oral     SpO2 11/12/21 0849 99 %     Weight --      Height --      Head Circumference --      Peak Flow --      Pain Score 11/12/21 0841 5     Pain Loc --      Pain Edu? --      Excl. in [place]? --     Most recent vital signs: Vitals:   11/12/21 0849  BP: (!) 157/103  Pulse: (!) 108  Resp: 17  Temp: 98.3 F (36.8 C)  SpO2: 99%     General: Awake, no distress.  Alert, talkative, answers questions appropriately. CV:  Good peripheral perfusion.  Resp:  Normal effort.  Lungs are clear bilaterally.  There is moderate tenderness on palpation of the fourth, fifth, sixth and seventh right lateral ribs but no deformity or soft tissue edema is noted.  There is no soft tissue discoloration. Abd:  No distention.  Soft, nontender. Other:  There is an abrasion to the right knee secondary to his fall with no tenderness on palpation and no evidence of infection.  No soft tissue edema in this area and patient is ambulatory without any assistance.  No point tenderness on palpation of cervical, thoracic or lumbar spine.   ED Results / Procedures / Treatments   Labs (all labs ordered are listed, but  only abnormal results are displayed) Labs Reviewed - No data to display   EKG  Vent. rate 102 BPM PR interval 192 ms QRS duration 140 ms QT/QTcB 364/474 ms P-R-T axes 79 -87 46 Sinus tachycardia Right bundle branch block  RADIOLOGY  Right rib x-ray with 1 view chest was reviewed by myself independent of the radiologist and no acute bony injury or cardiopulmonary changes noted.  Radiology report no acute cardiopulmonary disease and no fracture noted.   PROCEDURES:  Critical Care performed:   Procedures   MEDICATIONS ORDERED IN ED: Medications - No data to display   IMPRESSION / MDM / [place] / ED COURSE  I reviewed the triage vital signs and the nursing notes.   Differential diagnosis includes, but is not limited to, contusion right ribs, fractured right ribs, pneumothorax, hemothorax.  61 year old male presents to the ED after a fall that occurred 2 days ago when he fell on some steps at home due to rain.  Patient denies any head injury or loss of consciousness.  X-rays  were negative for acute bony injury and patient was made aware.  EKG showed his regular sinus tachycardia and interpretation is as above.  Patient was made aware that there was no fracture noted.  He will follow-up with his PCP if any continued problems or concerns.      Patient's presentation is most consistent with acute complicated illness / injury requiring diagnostic workup.  FINAL CLINICAL IMPRESSION(S) / ED DIAGNOSES   Final diagnoses:  Contusion of ribs, right, initial encounter  Fall (on) (from) other stairs and steps, initial encounter     Rx / DC Orders   ED Discharge Orders     None        Note:  This document was prepared using Dragon voice recognition software and may include unintentional dictation errors.   Kyle Hai, PA-C 11/12/21 1115    Naaman Plummer, MD 11/12/21 [phone]

## 2021-11-12 NOTE — Discharge Instructions (Signed)
Follow-up with the [facility] hospital if any continued problems or concerns.  Today your chest x-ray does not show any signs of a rib fracture.  Make sure that you are taking deep breaths frequently to avoid getting pneumonia.  Use the pillow that we discussed to place over your ribs anytime you feel like you need to cough, sneeze and continue taking deep breaths to prevent getting pneumonia.  You may continue with your regular medication.  Ice if needed to your ribs as needed for pain.

## 2021-11-12 NOTE — Telephone Encounter (Signed)
It was sent to [place] scheduling pool and rescheduled lexi last week.

## 2022-05-07 ENCOUNTER — Telehealth: Payer: Self-pay | Admitting: Gastroenterology

## 2022-05-07 NOTE — Progress Notes (Signed)
    No show

## 2022-05-09 ENCOUNTER — Ambulatory Visit: Payer: Medicare PPO

## 2022-05-14 ENCOUNTER — Ambulatory Visit: Payer: Medicare PPO

## 2022-05-20 ENCOUNTER — Ambulatory Visit: Admission: RE | Admit: 2022-05-20 | Payer: Medicare PPO | Source: Ambulatory Visit

## 2022-06-04 ENCOUNTER — Telehealth: Payer: Self-pay | Admitting: Nurse Practitioner

## 2022-06-04 NOTE — Telephone Encounter (Signed)
LVM to schedule

## 2022-06-18 ENCOUNTER — Encounter: Payer: Self-pay | Admitting: Nurse Practitioner

## 2022-06-18 ENCOUNTER — Other Ambulatory Visit: Payer: Self-pay | Admitting: Internal Medicine

## 2022-06-18 DIAGNOSIS — B182 Chronic viral hepatitis C: Secondary | ICD-10-CM

## 2022-06-24 ENCOUNTER — Ambulatory Visit: Admission: RE | Admit: 2022-06-24 | Payer: Medicare PPO | Source: Ambulatory Visit

## 2022-07-02 ENCOUNTER — Other Ambulatory Visit: Payer: Self-pay | Admitting: Internal Medicine

## 2022-07-02 DIAGNOSIS — K739 Chronic hepatitis, unspecified: Secondary | ICD-10-CM

## 2022-07-09 ENCOUNTER — Ambulatory Visit: Admission: RE | Admit: 2022-07-09 | Payer: Medicare PPO | Source: Ambulatory Visit

## 2022-07-10 ENCOUNTER — Telehealth: Payer: Self-pay | Admitting: Gastroenterology

## 2022-07-10 NOTE — Telephone Encounter (Signed)
Pt needs call back when June schedule opens

## 2022-07-11 ENCOUNTER — Encounter: Payer: Self-pay | Admitting: Emergency Medicine

## 2022-07-11 ENCOUNTER — Emergency Department: Payer: No Typology Code available for payment source

## 2022-07-11 ENCOUNTER — Other Ambulatory Visit: Payer: Self-pay

## 2022-07-11 ENCOUNTER — Emergency Department
Admission: EM | Admit: 2022-07-11 | Discharge: 2022-07-11 | Disposition: A | Discharge status: Home / Self Care | Payer: No Typology Code available for payment source | Attending: Emergency Medicine | Admitting: Emergency Medicine

## 2022-07-11 DIAGNOSIS — Z794 Long term (current) use of insulin: Secondary | ICD-10-CM | POA: Diagnosis not present

## 2022-07-11 DIAGNOSIS — R110 Nausea: Secondary | ICD-10-CM

## 2022-07-11 DIAGNOSIS — I10 Essential (primary) hypertension: Secondary | ICD-10-CM | POA: Insufficient documentation

## 2022-07-11 DIAGNOSIS — Z1152 Encounter for screening for COVID-19: Secondary | ICD-10-CM | POA: Insufficient documentation

## 2022-07-11 DIAGNOSIS — Z79899 Other long term (current) drug therapy: Secondary | ICD-10-CM | POA: Diagnosis not present

## 2022-07-11 DIAGNOSIS — R059 Cough, unspecified: Secondary | ICD-10-CM | POA: Diagnosis present

## 2022-07-11 DIAGNOSIS — J069 Acute upper respiratory infection, unspecified: Secondary | ICD-10-CM | POA: Insufficient documentation

## 2022-07-11 DIAGNOSIS — Z7982 Long term (current) use of aspirin: Secondary | ICD-10-CM | POA: Diagnosis not present

## 2022-07-11 LAB — GROUP A STREP BY PCR: Group A Strep by PCR: NOT DETECTED

## 2022-07-11 LAB — RESP PANEL BY RT-PCR (RSV, FLU A&B, COVID)  RVPGX2
Influenza A by PCR: NEGATIVE
Influenza B by PCR: NEGATIVE
Resp Syncytial Virus by PCR: NEGATIVE
SARS Coronavirus 2 by RT PCR: NEGATIVE

## 2022-07-11 MED ORDER — ONDANSETRON 4 MG PO TBDP: 4.0000 mg | ORAL_TABLET | Freq: Three times a day (TID) | ORAL | 0 refills | Status: DC | PRN

## 2022-07-11 MED ORDER — IPRATROPIUM-ALBUTEROL 0.5-2.5 (3) MG/3ML IN SOLN
3.0000 mL | Freq: Once | RESPIRATORY_TRACT | Status: AC
  Administered 2022-07-11: 3 mL via RESPIRATORY_TRACT
  Filled 2022-07-11: qty 3

## 2022-07-11 MED ORDER — ALBUTEROL SULFATE HFA 108 (90 BASE) MCG/ACT IN AERS: 2.0000 | INHALATION_SPRAY | Freq: Four times a day (QID) | RESPIRATORY_TRACT | 0 refills | Status: DC | PRN

## 2022-07-11 MED ORDER — BENZONATATE 100 MG PO CAPS: 100.0000 mg | ORAL_CAPSULE | Freq: Three times a day (TID) | ORAL | 0 refills | Status: AC | PRN

## 2022-07-11 NOTE — ED Triage Notes (Signed)
Patient to ED for cough and sore throat for "a couple of days." States chest hurts when coughing. Hx of hypertension and has not taken BP meds today. NAD noted.

## 2022-07-11 NOTE — Discharge Instructions (Signed)
Please take cough medication as prescribed.  You can use albuterol as needed for wheezing.  Use Zofran as needed for nausea/vomiting.  Make sure you are drinking lots of fluids.  Take blood pressure medication as prescribed.  You may use Tylenol or ibuprofen as needed for any fevers or chills.  Return to the ER for any worsening symptoms or any urgent changes in your health.

## 2022-07-11 NOTE — ED Provider Notes (Signed)
Kyle [name]   CSN: [id number] Arrival date & time: 07/11/22  1525     History  Chief Complaint  Patient presents with   Cough    Kyle [name] is a 62 y.o. male presents to the emergency department for evaluation of cough, sore throat, nausea, vomiting.  Patient has had 2 days of cough, sore throat, nausea and vomiting.  Denies any chest pain or shortness of breath.  Has had some slight wheezing.  He has had some runny nose and congestion.  No diarrhea, abdominal pain.  He is tolerating p.o. well.  HPI     Home Medications Prior to Admission medications   Medication Sig Start Date End Date Taking? Authorizing Provider  albuterol (VENTOLIN HFA) 108 (90 Base) MCG/ACT inhaler Inhale 2 puffs into the lungs every 6 (six) hours as needed for wheezing or shortness of breath. 07/11/22  Yes Duanne Guess, PA-C  benzonatate (TESSALON PERLES) 100 MG capsule Take 1 capsule (100 mg total) by mouth 3 (three) times daily as needed for cough. 07/11/22 07/11/23 Yes Duanne Guess, PA-C  ondansetron (ZOFRAN-ODT) 4 MG disintegrating tablet Take 1 tablet (4 mg total) by mouth every 8 (eight) hours as needed for nausea or vomiting. 07/11/22  Yes Duanne Guess, PA-C  amLODipine (NORVASC) 10 MG tablet Take 1 tablet by mouth daily. 11/20/20   [provider]  aspirin EC 81 MG tablet Take 1 tablet (81 mg total) by mouth daily. 05/17/16   Minna Merritts, MD  atorvastatin (LIPITOR) 40 MG tablet Take 20 mg by mouth daily. 12/19/20   [provider]  busPIRone (BUSPAR) 7.5 MG tablet Take 7.5 mg by mouth 2 (two) times daily. 05/31/21   [provider]  Cetirizine HCl 10 MG CAPS Take 1 capsule (10 mg total) by mouth daily. 12/20/18   Triplett, Johnette Abraham B, FNP  insulin aspart (NOVOLOG) 100 UNIT/ML FlexPen Inject 28 Units into the skin with breakfast, with lunch, and with evening meal. 11/06/20   [provider]  insulin  glargine (LANTUS) 100 UNIT/ML injection Inject 30 Units into the skin 2 (two) times daily.    [provider]  isosorbide mononitrate (IMDUR) 30 MG 24 hr tablet Take 1 tablet (30 mg total) by mouth daily. 06/08/18   Dunn, Areta Haber, PA-C  lisinopril (ZESTRIL) 40 MG tablet Take 1 tablet by mouth daily. 11/29/20   [provider]  metoprolol succinate (TOPROL-XL) 25 MG 24 hr tablet Take 2 tablets (50 mg total) by mouth daily. 06/08/18   Dunn, Areta Haber, PA-C  nitroGLYCERIN (NITROSTAT) 0.4 MG SL tablet Place 1 tablet (0.4 mg total) under the tongue every 5 (five) minutes as needed for chest pain. 09/29/14   Aldean Jewett, MD  nortriptyline (PAMELOR) 75 MG capsule Take 75 mg by mouth at bedtime. 05/22/21   [provider]  pantoprazole (PROTONIX) 20 MG tablet Take 1 tablet (20 mg total) by mouth daily for 14 days. 08/20/21 09/04/21  Vanessa Rachel, MD  sucralfate (CARAFATE) 1 g tablet Take 1 tablet (1 g total) by mouth 4 (four) times daily. 08/20/21 08/20/22  Harvest Dark, MD  traZODone (DESYREL) 50 MG tablet Take 50 mg by mouth at bedtime. 05/31/21   [provider]      Allergies    Trazodone and nefazodone, Amoxicillin, Cyclobenzaprine, Methadone, Penicillin g, Propoxyphene, Tramadol, and Trazodone    Review of Systems   Review of Systems  Physical Exam  Updated Vital Signs BP (!) 184/99   Pulse 97   Temp 98 F (36.7 C) (Oral)   Resp 18   SpO2 98%  Physical Exam Constitutional:      Appearance: He is well-developed.  HENT:     Head: Normocephalic and atraumatic.     Right Ear: External ear normal.     Left Ear: External ear normal.     Mouth/Throat:     Mouth: Mucous membranes are moist.     Pharynx: Oropharynx is clear. No oropharyngeal exudate or posterior oropharyngeal erythema.  Eyes:     Conjunctiva/sclera: Conjunctivae normal.  Cardiovascular:     Rate and Rhythm: Normal rate.     Pulses: Normal pulses.     Heart sounds: Normal heart sounds.   Pulmonary:     Effort: Pulmonary effort is normal. No respiratory distress.     Breath sounds: Normal breath sounds.  Chest:     Chest wall: No tenderness.  Abdominal:     General: Abdomen is flat. Bowel sounds are normal. There is no distension.     Tenderness: There is no abdominal tenderness. There is no guarding.  Musculoskeletal:        General: Normal range of motion.     Cervical back: Normal range of motion.  Skin:    General: Skin is warm.     Findings: No rash.  Neurological:     General: No focal deficit present.     Mental Status: He is alert and oriented to person, place, and time. Mental status is at baseline.  Psychiatric:        Behavior: Behavior normal.        Thought Content: Thought content normal.     ED Results / Procedures / Treatments   Labs (all labs ordered are listed, but only abnormal results are displayed) Labs Reviewed  GROUP A STREP BY PCR  RESP PANEL BY RT-PCR (RSV, FLU A&B, COVID)  [id number]    EKG None  Radiology DG Chest 2 View  Result Date: 07/11/2022 CLINICAL DATA:  Cough and sore throat. EXAM: CHEST - 2 VIEW COMPARISON:  Chest and right rib radiographs 11/12/2021, chest two views 08/20/2021 FINDINGS: Status post median sternotomy. Cardiac silhouette and mediastinal contours are within normal limits. The lungs are clear. No pleural effusion pneumothorax. No acute skeletal abnormality. IMPRESSION: No active cardiopulmonary disease. Electronically Signed   By: Yvonne Kendall M.D.   On: 07/11/2022 17:46    Procedures Procedures    Medications Ordered in ED Medications  ipratropium-albuterol (DUONEB) 0.5-2.5 (3) MG/3ML nebulizer solution 3 mL (3 mLs Nebulization Given 07/11/22 1705)    ED Course/ Medical Decision Making/ A&P                             Medical Decision Making Amount and/or Complexity of Data Reviewed Radiology: ordered.   62 year old male with nausea, vomiting, cough sore throat runny nose and congestion.   Symptoms for 2 days.  Flu COVID RSV and strep test negative.  Chest x-ray obtained and reviewed by me today show no acute cardiopulmonary process.  Patient initially with some wheezing but this resolved with DuoNeb treatment.  He appears well in no distress.  He is given some medications to help with symptoms including Zofran, Tessalon Perles, albuterol.  He will take medications as prescribed he is encouraged to use Tylenol as needed for any fevers or chills.  He will return for any  worsening symptoms or any urgent changes his health. Final Clinical Impression(s) / ED Diagnoses Final diagnoses:  Viral URI with cough  Nausea    Rx / DC Orders ED Discharge Orders          Ordered    benzonatate (TESSALON PERLES) 100 MG capsule  3 times daily PRN        07/11/22 1755    albuterol (VENTOLIN HFA) 108 (90 Base) MCG/ACT inhaler  Every 6 hours PRN        07/11/22 1755    ondansetron (ZOFRAN-ODT) 4 MG disintegrating tablet  Every 8 hours PRN        07/11/22 1755              Duanne Guess, PA-C 07/11/22 1757    Harvest Dark, MD 07/11/22 1932

## 2022-07-25 ENCOUNTER — Ambulatory Visit: Payer: Medicare PPO | Admitting: Nurse Practitioner

## 2022-07-25 NOTE — Progress Notes (Deleted)
Office Visit    Patient Name: Kyle [name] Date of Encounter: 07/25/2022  Primary Care Provider:  [place] Primary Cardiologist:  Kyle Rogue, MD  Chief Complaint    61 year old male history of CAD status post CABG x 4 in July 2014, hypertension, hyperlipidemia, diabetes, tobacco abuse, and alcohol abuse, presents for CAD follow-up.  Past Medical History    Past Medical History:  Diagnosis Date   Allergy    Asthma    Chronic back pain 1981   a.followed by pain clinic and neurosurgery.   COPD (chronic obstructive pulmonary disease) (HCC)    Coronary artery disease involving native coronary artery    a. 10/2012 CABG x 4 @ [facility] (LIMA->LAD, VG->RI, VG->OM, VG->PDA);  b. 08/2014 Lexiscan MV: small defect of mild severity present along apical lateral & apex location, no st segment changes, LV function 45-54%, poor gated images lead to poor EF & wall motion assessment, low risk study; c. 02/2019 MV: EF 53%, apical defect (atten vs scar), No ischemia-->low risk.   DDD (degenerative disc disease)    Diastolic dysfunction    a. 02/2019 Echo: EF 60-65%, mod LVH. Gr2 DD. Nl RV fxn. Unable to exclude bicuspid AoV; b. 04/2020 Echo: EF 50-55% anteroseptal HK, GrI DD, nl rV fxn, mild Ao sclerosis.   Dilated aortic root ([place])    a. 02/2019 Echo: Ao root 46mm, Asc Ao 47mm, Ao arch 56mm; b. 04/2020 Echo: Ao root 36mm. Asc Ao 32mm; c. 06/2020 CTA Chest: No Ao aneurysm or dissection.   DM2 (diabetes mellitus, type 2) (HCC)    Fainting spell    GERD (gastroesophageal reflux disease)    Hepatitis C    2/2 tattoo   History of medication noncompliance    Hyperlipidemia with target LDL less than 70    Hypertension    Polysubstance abuse ([place])    a. ongoing daily tobacco abuse and etoh (on the weekends), remote crack/cocaine abuse last used approximately in 2006   S/P CABG x 4    a. LIMA-LAD, SVG-[place], SVG-OM,SVG-dRCA   Urine incontinence    Past Surgical History:   Procedure Laterality Date   CARDIAC CATHETERIZATION  11/2012   [facility] - Multivessel CAD   CORONARY ARTERY BYPASS GRAFT  8/14   CABG x 4; LIMA-LAD, SVG-RI, SVG-OM1, SVG-rPDA (Atretic RIMA).   neck injection      Allergies  Allergies  Allergen Reactions   Trazodone And Nefazodone     Reaction: GI upset   Amoxicillin Itching   Cyclobenzaprine Itching and Rash    rash rash   Methadone Itching    Reaction: Gi upset Reaction: Gi upset   Penicillin G Itching   Propoxyphene Hives   Tramadol Nausea And Vomiting   Trazodone Nausea And Vomiting    Reaction: GI upset Reaction: GI upset    History of Present Illness    62 year old male with above past medical history including CAD status post prior four-vessel bypass at the [facility] in July 2014.  Other history includes hypertension, hyperlipidemia, tobacco abuse, remote crack/cocaine abuse, diabetes, GERD, and COPD.  In November 2020, he was evaluated in clinic with complaints of occasional chest burning.  Stress testing was undertaken and showed a small, fixed apical defect with question of artifact versus scar.  There was no significant ischemia.  CT attenuation images showed severe three-vessel coronary calcifications with mild aortic atherosclerosis.  Overall, study was felt to be low risk.  Echo  showed EF of 60 to 65% with grade 2 diastolic dysfunction, and mild to moderate dilation of the aorta.  Imaging was unable to exclude a bicuspid aortic valve.  He had a follow-up echo in January 2022 that showed an EF of 50-55% with anteroseptal hypokinesis, grade 1 diastolic dysfunction, mild aortic sclerosis, and stable aortic root/ascending aorta at 45 mm and 39 mm respectively.  CTA of the chest in March 2022 made no comment on aortic size but noted no dissection.  Kyle [name] was last seen in cardiology clinic in May 2023, at which time he continued to smoke 1 pack of cigarettes a week.  He reported frequent,  sharp, rest and exertional chest pain, for which he had previously been seen in the emergency department with normal workup.  We arranged for a Lexiscan Myoview however, patient did not follow through.  Home Medications    Current Outpatient Medications  Medication Sig Dispense Refill   albuterol (VENTOLIN HFA) 108 (90 Base) MCG/ACT inhaler Inhale 2 puffs into the lungs every 6 (six) hours as needed for wheezing or shortness of breath. 8 g 0   amLODipine (NORVASC) 10 MG tablet Take 1 tablet by mouth daily.     aspirin EC 81 MG tablet Take 1 tablet (81 mg total) by mouth daily. 90 tablet 3   atorvastatin (LIPITOR) 40 MG tablet Take 20 mg by mouth daily.     benzonatate (TESSALON PERLES) 100 MG capsule Take 1 capsule (100 mg total) by mouth 3 (three) times daily as needed for cough. 30 capsule 0   busPIRone (BUSPAR) 7.5 MG tablet Take 7.5 mg by mouth 2 (two) times daily.     Cetirizine HCl 10 MG CAPS Take 1 capsule (10 mg total) by mouth daily. 30 capsule 3   insulin aspart (NOVOLOG) 100 UNIT/ML FlexPen Inject 28 Units into the skin with breakfast, with lunch, and with evening meal.     insulin glargine (LANTUS) 100 UNIT/ML injection Inject 30 Units into the skin 2 (two) times daily.     isosorbide mononitrate (IMDUR) 30 MG 24 hr tablet Take 1 tablet (30 mg total) by mouth daily. 90 tablet 1   lisinopril (ZESTRIL) 40 MG tablet Take 1 tablet by mouth daily.     metoprolol succinate (TOPROL-XL) 25 MG 24 hr tablet Take 2 tablets (50 mg total) by mouth daily. 180 tablet 1   nitroGLYCERIN (NITROSTAT) 0.4 MG SL tablet Place 1 tablet (0.4 mg total) under the tongue every 5 (five) minutes as needed for chest pain. 21 tablet 12   nortriptyline (PAMELOR) 75 MG capsule Take 75 mg by mouth at bedtime.     ondansetron (ZOFRAN-ODT) 4 MG disintegrating tablet Take 1 tablet (4 mg total) by mouth every 8 (eight) hours as needed for nausea or vomiting. 20 tablet 0   pantoprazole (PROTONIX) 20 MG tablet Take 1  tablet (20 mg total) by mouth daily for 14 days. 14 tablet 0   sucralfate (CARAFATE) 1 g tablet Take 1 tablet (1 g total) by mouth 4 (four) times daily. 120 tablet 1   traZODone (DESYREL) 50 MG tablet Take 50 mg by mouth at bedtime.     No current facility-administered medications for this visit.     Review of Systems    ***.  All other systems reviewed and are otherwise negative except as noted above.    Physical Exam    VS:  There were no vitals taken for this visit. , BMI There is no height  or weight on file to calculate BMI.     GEN: Well nourished, well developed, in no acute distress. HEENT: normal. Neck: Supple, no JVD, carotid bruits, or masses. Cardiac: RRR, no murmurs, rubs, or gallops. No clubbing, cyanosis, edema.  Radials 2+/PT 2+ and equal bilaterally.  Respiratory:  Respirations regular and unlabored, clear to auscultation bilaterally. GI: Soft, nontender, nondistended, BS + x 4. MS: no deformity or atrophy. Skin: warm and dry, no rash. Neuro:  Strength and sensation are intact. Psych: Normal affect.  Accessory Clinical Findings    ECG personally reviewed by me today - *** - no acute changes.  Lab Results  Component Value Date   WBC 9.6 08/20/2021   HGB 14.4 08/20/2021   HCT 45.3 08/20/2021   MCV 86.6 08/20/2021   PLT 147 (L) 08/20/2021   Lab Results  Component Value Date   CREATININE 0.74 08/20/2021   BUN 13 08/20/2021   NA 134 (L) 08/20/2021   K 4.0 08/20/2021   CL 97 (L) 08/20/2021   CO2 27 08/20/2021   Lab Results  Component Value Date   ALT 83 (H) 08/20/2021   AST 45 (H) 08/20/2021   ALKPHOS 66 08/20/2021   BILITOT 0.7 08/20/2021   Lab Results  Component Value Date   CHOL 156 06/08/2018   HDL 36 (L) 06/08/2018   LDLCALC 78 06/08/2018   LDLDIRECT 97 06/08/2018   TRIG 212 (H) 06/08/2018   CHOLHDL 4.3 06/08/2018    Lab Results  Component Value Date   HGBA1C 9.5 (H) 09/28/2014    Assessment & Plan    1.  ***   Murray Hodgkins, NP 07/25/2022, 8:00 AM

## 2022-08-07 ENCOUNTER — Ambulatory Visit: Payer: Medicare HMO | Attending: Nurse Practitioner | Admitting: Nurse Practitioner

## 2022-08-07 ENCOUNTER — Encounter: Payer: Self-pay | Admitting: Nurse Practitioner

## 2022-08-07 VITALS — BP 126/74 | HR 96 | Ht 67.0 in | Wt 149.8 lb

## 2022-08-07 DIAGNOSIS — Z951 Presence of aortocoronary bypass graft: Secondary | ICD-10-CM

## 2022-08-07 DIAGNOSIS — F1010 Alcohol abuse, uncomplicated: Secondary | ICD-10-CM

## 2022-08-07 DIAGNOSIS — I77810 Thoracic aortic ectasia: Secondary | ICD-10-CM

## 2022-08-07 DIAGNOSIS — E785 Hyperlipidemia, unspecified: Secondary | ICD-10-CM | POA: Diagnosis not present

## 2022-08-07 DIAGNOSIS — R072 Precordial pain: Secondary | ICD-10-CM

## 2022-08-07 DIAGNOSIS — I25110 Atherosclerotic heart disease of native coronary artery with unstable angina pectoris: Secondary | ICD-10-CM

## 2022-08-07 DIAGNOSIS — I10 Essential (primary) hypertension: Secondary | ICD-10-CM

## 2022-08-07 DIAGNOSIS — E118 Type 2 diabetes mellitus with unspecified complications: Secondary | ICD-10-CM

## 2022-08-07 DIAGNOSIS — Z720 Tobacco use: Secondary | ICD-10-CM

## 2022-08-07 NOTE — Patient Instructions (Signed)
Medication Instructions:  Your physician recommends that you continue on your current medications as directed. Please refer to the Current Medication list given to you today.  *If you need a refill on your cardiac medications before your next appointment, please call your pharmacy*  Lab Work: Your physician recommends that you return for lab work in: 6 months CBC, CMP, & fasting lipid panel Medical Mall Entrance at [facility] 1st desk on the right to check in (REGISTRATION)  Lab hours: Monday- Friday (7:30 am- 5:30 pm)  If you have labs (blood work) drawn today and your tests are completely normal, you will receive your results only by: MyChart Message (if you have MyChart) OR A paper copy in the mail If you have any lab test that is abnormal or we need to change your treatment, we will call you to review the results.  Testing/Procedures: Your provider has ordered a Lexiscan/ Exercise Myoview Stress test. This will take place at [facility]. Please report to the [facility] medical mall entrance. The volunteers at the first desk will direct you where to go.  [facility] MYOVIEW  Your provider has ordered a Stress Test with nuclear imaging. The purpose of this test is to evaluate the blood supply to your heart muscle. This procedure is referred to as a "Non-Invasive Stress Test." This is because other than having an IV started in your vein, nothing is inserted or "invades" your body. Cardiac stress tests are done to find areas of poor blood flow to the heart by determining the extent of coronary artery disease (CAD). Some patients exercise on a treadmill, which naturally increases the blood flow to your heart, while others who are unable to walk on a treadmill due to physical limitations will have a pharmacologic/chemical stress agent called Lexiscan . This medicine will mimic walking on a treadmill by temporarily increasing your coronary blood flow.   Please note: these test may take anywhere between 2-4 hours to  complete  How to prepare for your Myoview test:  Nothing to eat for 6 hours prior to the test No caffeine for 24 hours prior to test No smoking 24 hours prior to test. Your medication may be taken with water.  If your doctor stopped a medication because of this test, do not take that medication. Ladies, please do not wear dresses.  Skirts or pants are appropriate. Please wear a short sleeve shirt. No perfume, cologne or lotion. Wear comfortable walking shoes. No heels!   PLEASE NOTIFY THE OFFICE AT LEAST 24 HOURS IN ADVANCE IF YOU ARE UNABLE TO KEEP YOUR APPOINTMENT.  [phone] AND  PLEASE NOTIFY NUCLEAR MEDICINE AT [facility] AT LEAST 24 HOURS IN ADVANCE IF YOU ARE UNABLE TO KEEP YOUR APPOINTMENT. [phone]    Follow-Up: At [facility], you and your health needs are our priority.  As part of our continuing mission to provide you with exceptional heart care, we have created designated Provider Care Teams.  These Care Teams include your primary Cardiologist (physician) and Advanced Practice Providers (APPs -  Physician Assistants and Nurse Practitioners) who all work together to provide you with the care you need, when you need it.  We recommend signing up for the patient portal called "MyChart".  Sign up information is provided on this After Visit Summary.  MyChart is used to connect with patients for Virtual Visits (Telemedicine).  Patients are able to view lab/test results, encounter notes, upcoming appointments, etc.  Non-urgent messages can be sent to your provider as well.  To learn more about what you can do with MyChart, go to [email or web address].    Your next appointment:   6 month(s)  Provider:   You may see Julien Nordmann, MD or one of the following Advanced Practice Providers on your designated Care Team:   Nicolasa Ducking, NP Eula Listen, PA-C Cadence Fransico Wadell, PA-C Charlsie Quest, NP   Other Instructions -None

## 2022-08-07 NOTE — Progress Notes (Signed)
Office Visit    Patient Name: Kyle [name] Date of Encounter: 08/07/2022  Primary Care Provider:  [facility] Primary Cardiologist:  Julien Nordmann, MD  Chief Complaint    62 y/o ? w/ a h/o CAD s/p CABG x 4 in 10/2012, HTN, HL, DMII, tob abuse, and etoh abuse, who presents for f/u related to CAD.  Past Medical History    Past Medical History:  Diagnosis Date   Allergy    Asthma    Chronic back pain 1981   a.followed by pain clinic and neurosurgery.   COPD (chronic obstructive pulmonary disease)    Coronary artery disease involving native coronary artery    a. 10/2012 CABG x 4 @ [facility] (LIMA->LAD, VG->RI, VG->OM, VG->PDA);  b. 08/2014 Lexiscan MV: small defect of mild severity present along apical lateral & apex location, no st segment changes, LV function 45-54%, poor gated images lead to poor EF & wall motion assessment, low risk study; c. 02/2019 MV: EF 53%, apical defect (atten vs scar), No ischemia-->low risk.   DDD (degenerative disc disease)    Diastolic dysfunction    a. 02/2019 Echo: EF 60-65%, mod LVH. Gr2 DD. Nl RV fxn. Unable to exclude bicuspid AoV; b. 04/2020 Echo: EF 50-55% anteroseptal HK, GrI DD, nl rV fxn, mild Ao sclerosis.   Dilated aortic root    a. 02/2019 Echo: Ao root 44mm, Asc Ao 40mm, Ao arch 27mm; b. 04/2020 Echo: Ao root 45mm. Asc Ao 39mm; c. 06/2020 CTA Chest: No Ao aneurysm or dissection.   DM2 (diabetes mellitus, type 2)    Fainting spell    GERD (gastroesophageal reflux disease)    Hepatitis C    2/2 tattoo   History of medication noncompliance    Hyperlipidemia with target LDL less than 70    Hypertension    Polysubstance abuse    a. ongoing daily tobacco abuse and etoh (on the weekends), remote crack/cocaine abuse last used approximately in 2006   S/P CABG x 4    a. LIMA-LAD, SVG-Ramus, SVG-OM,SVG-dRCA   Urine incontinence    Past Surgical History:  Procedure Laterality Date   CARDIAC CATHETERIZATION  11/2012    [facility] - Multivessel CAD   CORONARY ARTERY BYPASS GRAFT  8/14   CABG x 4; LIMA-LAD, SVG-RI, SVG-OM1, SVG-rPDA (Atretic RIMA).   neck injection      Allergies  Allergies  Allergen Reactions   Gabapentin Nausea And Vomiting   Trazodone And Nefazodone     Reaction: GI upset   Amoxicillin Itching and Swelling   Cyclobenzaprine Itching and Rash    rash rash   Methadone Itching and Nausea And Vomiting    Reaction: Gi upset   Naproxen Hives   Penicillin G Itching   Propoxyphene Hives   Tramadol Nausea And Vomiting, Hives and Rash   Trazodone Nausea And Vomiting    Reaction: GI upset  Other Reaction(s): Tachycardia    History of Present Illness    62 y/o ? w/ the above PMH including CAD status post four-vessel bypass/[facility] July 2014.  Other history includes hypertension, hyperlipidemia, tobacco abuse, remote crack/cocaine abuse, diabetes, GERD, and COPD.  In  2020, he was evaluated in clinic with complaints of occasional chest burning.  Stress testing was undertaken and showed a small, fixed apical defect with question of artifact versus scar.  There was no significant ischemia.  CT attenuation images showed severe three-vessel coronary calcifications with mild aortic atherosclerosis.  Overall SK was felt to be low risk and medical therapy was recommended.  Echo showed EF of 60-65% with grade 2 diastolic dysfunction and mild to moderate dilation of the aortic root (44 mm), and ascending aorta (40 mm).  Imaging was unable to exclude a bicuspid aortic valve.  Follow-up echo in January 2022 showed EF 50 to 55% with anteroseptal hypokinesis, grade 1 diastolic dysfunction, mild aortic sclerosis, and aortic root 45 mm/ascending aorta 3 mm.  CT of the chest in March 2022 reportedly showed no evidence of aortic dissection or aneurysm.  Kyle [name] was last seen in cardiology clinic in May 2023, at which time he reported occasional sharp and shooting chest pain lasting for  a few seconds to up to 5 minutes.  We had planned on a Lexiscan Myoview however, he did not follow through.  Over the past year, he has continued to have intermittent sharp chest pain at rest and w/ exertion.  Symptoms typically last 2 to 3 minutes, and resolve spontaneously.  He has chronic, stable dyspnea on exertion and continues to smoke a pack of cigarettes per week.  Though he has cut back on drinking alcohol, he still admits to drinking beer and liquor shots on the weekends.  He denies palpitations, PND, orthopnea, dizziness, syncope, edema, or early satiety.  Home Medications    Current Outpatient Medications  Medication Sig Dispense Refill   albuterol (VENTOLIN HFA) 108 (90 Base) MCG/ACT inhaler Inhale 2 puffs into the lungs every 6 (six) hours as needed for wheezing or shortness of breath. 8 g 0   amLODipine (NORVASC) 10 MG tablet Take 1 tablet by mouth daily.     ammonium lactate (LAC-HYDRIN) 12 % lotion SMARTSIG:1 Sparingly Topical Twice Daily     aspirin EC 81 MG tablet Take 1 tablet (81 mg total) by mouth daily. 90 tablet 3   atorvastatin (LIPITOR) 40 MG tablet Take 20 mg by mouth daily.     benzonatate (TESSALON PERLES) 100 MG capsule Take 1 capsule (100 mg total) by mouth 3 (three) times daily as needed for cough. 30 capsule 0   busPIRone (BUSPAR) 7.5 MG tablet Take 7.5 mg by mouth 2 (two) times daily.     Cetirizine HCl 10 MG CAPS Take 1 capsule (10 mg total) by mouth daily. 30 capsule 3   DULERA 100-5 MCG/ACT AERO Inhale 2 puffs into the lungs 2 (two) times daily.     FLONASE ALLERGY RELIEF 50 MCG/ACT nasal spray Place into both nostrils.     hydrocortisone 2.5 % ointment Apply 1 a small amount to affected area twice a day as directed     insulin aspart (NOVOLOG) 100 UNIT/ML FlexPen Inject 28 Units into the skin with breakfast, with lunch, and with evening meal.     isosorbide mononitrate (IMDUR) 30 MG 24 hr tablet Take 1 tablet (30 mg total) by mouth daily. 90 tablet 1   LANTUS  SOLOSTAR 100 UNIT/ML Solostar Pen SMARTSIG:34 Unit(s) SUB-Q Twice Daily     metoprolol succinate (TOPROL-XL) 50 MG 24 hr tablet Take 50 mg by mouth daily.     nitroGLYCERIN (NITROSTAT) 0.4 MG SL tablet Place 1 tablet (0.4 mg total) under the tongue every 5 (five) minutes as needed for chest pain. 21 tablet 12   nortriptyline (PAMELOR) 75 MG capsule Take 75 mg by mouth at bedtime.     omeprazole (PRILOSEC) 20 MG capsule Take 20 mg by mouth every morning.     ondansetron (ZOFRAN-ODT) 4 MG disintegrating  tablet Take 1 tablet (4 mg total) by mouth every 8 (eight) hours as needed for nausea or vomiting. 20 tablet 0   sildenafil (VIAGRA) 100 MG tablet TAKE ONE TABLET BY MOUTH AS DIRECTED FOR ERECTILE DYSFUNCTION 1 HOUR BEFORE SEXUAL ACTIVITY. DO NOT TAKE WITHIN 6 HOURS OF TERAZOSIN, PRAZOSIN, OR DOXAZOSIN. DO NOT TAKE MORE THAN 1 DOSE WITHIN 24 HOURS . DO NOT TAKE WITH NITRATES     sucralfate (CARAFATE) 1 g tablet Take 1 tablet (1 g total) by mouth 4 (four) times daily. 120 tablet 1   SYMBICORT 160-4.5 MCG/ACT inhaler Inhale 2 puffs into the lungs 2 (two) times daily.     traZODone (DESYREL) 50 MG tablet Take 50 mg by mouth at bedtime.     triamcinolone ointment (KENALOG) 0.5 % Apply 1 a small amount to affected area twice a day as directed     lisinopril (ZESTRIL) 40 MG tablet Take 1 tablet by mouth daily. (Patient not taking: Reported on 08/07/2022)     pantoprazole (PROTONIX) 20 MG tablet Take 1 tablet (20 mg total) by mouth daily for 14 days. 14 tablet 0   No current facility-administered medications for this visit.     Review of Systems    Intermittent rest and exertional, sharp chest pain which remains of a concern to him.  Chronic dyspnea on exertion.  He denies palpitations, PND, orthopnea, dizziness, syncope, edema, or early satiety.  All other systems reviewed and are otherwise negative except as noted above.    Physical Exam    VS:  BP 126/74   Pulse 96   Ht 5\' 7"  (1.702 m)   Wt 149 lb  12.8 oz (67.9 kg)   SpO2 98%   BMI 23.46 kg/m  , BMI Body mass index is 23.46 kg/m.     GEN: Well nourished, well developed, in no acute distress. HEENT: normal. Neck: Supple, no JVD, carotid bruits, or masses. Cardiac: RRR, no murmurs, rubs, or gallops. No clubbing, cyanosis, edema.  Radials 2+/PT 2+ and equal bilaterally.  Respiratory:  Respirations regular and unlabored, diminished breath sounds bilaterally. GI: Soft, nontender, nondistended, BS + x 4. MS: no deformity or atrophy. Skin: warm and dry, no rash. Neuro:  Strength and sensation are intact. Psych: Normal affect.  Accessory Clinical Findings    ECG personally reviewed by me today -regular sinus rhythm, 96, left axis deviation, left anterior fascicular block, right bundle branch block- no acute changes.  Lab Results  Component Value Date   WBC 9.6 08/20/2021   HGB 14.4 08/20/2021   HCT 45.3 08/20/2021   MCV 86.6 08/20/2021   PLT 147 (L) 08/20/2021   Lab Results  Component Value Date   CREATININE 0.74 08/20/2021   BUN 13 08/20/2021   NA 134 (L) 08/20/2021   K 4.0 08/20/2021   CL 97 (L) 08/20/2021   CO2 27 08/20/2021   Lab Results  Component Value Date   ALT 83 (H) 08/20/2021   AST 45 (H) 08/20/2021   ALKPHOS 66 08/20/2021   BILITOT 0.7 08/20/2021   Lab Results  Component Value Date   CHOL 156 06/08/2018   HDL 36 (L) 06/08/2018   LDLCALC 78 06/08/2018   LDLDIRECT 97 06/08/2018   TRIG 212 (H) 06/08/2018   CHOLHDL 4.3 06/08/2018    Lab Results  Component Value Date   HGBA1C 9.5 (H) 09/28/2014    Assessment & Plan    1.  Precordial chest pain/coronary artery disease: Status post CABG x 4 in  2014.  Negative Myoview in November 2020.  At last office visit in May 2023, he reported intermittent sharp chest pain with recommendation for stress testing, though he did not follow through.  He has continued to have intermittent sharp rest and exertional chest pain, which remains of concern to him.  He remains  interested in stress testing.  I will arrange for a Lexiscan Myoview.  Continue aspirin, statin, beta-blocker, calcium channel blocker, nitrate, and ACE inhibitor therapy.  2.  Essential hypertension: Blood pressure 126/74 today.  He has not yet taken his morning medications.  Continue current regimen including beta-blocker, ACE inhibitor, nitrate, and calcium channel blocker therapy.  Overdue for follow-up blood chemistry in the setting of ACE inhibitor therapy and we will arrange for this.  3.  Hyperlipidemia: LDL of 97 in February 2020.  He indicated previously that this was being followed at the [facility] however, he has not been seen there in some time.  I will arrange for follow-up with fasting lipids and LFTs when he presents for his stress test.  4.  Tobacco abuse: Still smoking about a pack of cigarettes per week.  Chronic, stable dyspnea on exertion.  Preserved little air on exam.  Complete cessation advised.  5.  Alcohol abuse: Continues to drink heavily on weekends.  Complete cessation advised.  Follow-up LFTs in setting of ongoing statin use.  6.  Type 2 diabetes mellitus: On insulin and followed at the [facility], though he indicates that he has not been seen there in some time.  Recommend primary care follow-up.  7.  Dilated aortic root: Echo January 2022 showed an aortic root of 45 mm with ascending aorta 39 mm.  CT angiogram of the chest in March 2022 showed no evidence of aortic aneurysm or dissection per radiologist.  8.  Disposition: Follow-up Lexiscan Myoview.  Follow-up CBC, complete metabolic panel, and lipids.  Provided that Myoview results are reassuring, will plan to see back in clinic in 6 months.  Nicolasa Ducking, NP 08/07/2022, 8:25 AM

## 2022-08-14 ENCOUNTER — Ambulatory Visit
Admission: RE | Admit: 2022-08-14 | Discharge: 2022-08-14 | Disposition: A | Discharge status: Home / Self Care | Payer: Medicare HMO | Source: Ambulatory Visit | Attending: Internal Medicine | Admitting: Internal Medicine

## 2022-08-14 DIAGNOSIS — B182 Chronic viral hepatitis C: Secondary | ICD-10-CM | POA: Diagnosis present

## 2022-09-03 ENCOUNTER — Encounter
Admission: RE | Admit: 2022-09-03 | Discharge: 2022-09-03 | Disposition: A | Discharge status: Home / Self Care | Payer: Medicare HMO | Source: Ambulatory Visit | Attending: Nurse Practitioner | Admitting: Nurse Practitioner

## 2022-09-03 DIAGNOSIS — E785 Hyperlipidemia, unspecified: Secondary | ICD-10-CM | POA: Diagnosis present

## 2022-09-03 DIAGNOSIS — R072 Precordial pain: Secondary | ICD-10-CM | POA: Insufficient documentation

## 2022-09-03 LAB — NM MYOCAR MULTI W/SPECT W/WALL MOTION / EF
Nuc Stress EF: 56 %
Rest Nuclear Isotope Dose: 9.9 mCi
SSS: 1

## 2022-09-03 MED ORDER — TECHNETIUM TC 99M TETROFOSMIN IV KIT
30.9300 | PACK | Freq: Once | INTRAVENOUS | Status: AC | PRN
  Administered 2022-09-03: 30.93 via INTRAVENOUS

## 2022-09-03 MED ORDER — REGADENOSON 0.4 MG/5ML IV SOLN
0.4000 mg | Freq: Once | INTRAVENOUS | Status: AC
  Administered 2022-09-03: 0.4 mg via INTRAVENOUS

## 2022-09-03 MED ORDER — TECHNETIUM TC 99M TETROFOSMIN IV KIT
10.0000 | PACK | Freq: Once | INTRAVENOUS | Status: AC | PRN
  Administered 2022-09-03: 9.9 via INTRAVENOUS

## 2022-09-04 ENCOUNTER — Telehealth: Payer: Self-pay | Admitting: *Deleted

## 2022-09-04 LAB — NM MYOCAR MULTI W/SPECT W/WALL MOTION / EF
LV dias vol: 71 mL (ref 62–150)
LV sys vol: 31 mL
Peak HR: 105 {beats}/min
Rest HR: 83 {beats}/min
SDS: 3
SRS: 6
ST Depression (mm): 0 mm
Stress Nuclear Isotope Dose: 30.9 mCi
TID: 0.89

## 2022-09-04 NOTE — Telephone Encounter (Signed)
-----   Message from Creig Hines, NP sent at 09/04/2022  7:36 AM EDT ----- Stress test is abnormal with areas of poor blood flow to the heart muscle, concerning for recurrent heart artery blockage.  Pls arrange for follow-up with me or Dr. Mariah Milling within the next week to discuss further as we will need to consider a heart catheterization.

## 2022-09-04 NOTE — Telephone Encounter (Signed)
Patient made aware of results. Appointment schedule with Dr. Mariah Milling on 5/13.

## 2022-09-08 NOTE — Progress Notes (Deleted)
Evaluation Performed:  Follow-up visit  Date:  09/08/2022   ID:  Kyle [name], Sturgell 1960-12-31, MRN [id number]  Patient Location:  [address] [place] [address]   Provider location:   [place], [employer] office  PCP:  [facility]  Cardiologist:  Mariah Milling, [facility]   No chief complaint on file.    History of Present Illness:    Kyle [name] is a 62 y.o. male   Patient has a past medical history of poorly controlled diabetes, long history of smoking who continues to smoke,  hyperlipidemia,  coronary artery disease, bypass surgery.  4-vessel CABG (LIMA-LAD, SVG-RI, SVG-OM1, SVG-rPDA (atretic RIMA)) in 10/2012 at the [facility]  He presents for routine followup of his coronary artery disease   in the past has run out of his medications  Last seen by myself in clinic November 2020 Seen in 2023, did not go through with Myoview ordered Seen by one of our providers April 2024 Still smoking, drinking  Stress test Sep 03, 2022 Perfusion defect inferolateral and inferior wall with ischemia and scar    Not sleeping well, 2 hours at a time, "body wakes up" Tired,   Reports having occasional chest tightness He continues to smoke, 1 pack per week  Gets meds from the [place]  In the ER 02/2019,.  Details reviewed with him Generalized Body Aches  mild burning pain in the center of his chest that has also been going on for 3 weeks  -burning pain in his lower extremities, Metformin in the past few weeks because he ran out.  EKG personally reviewed by myself on todays visit Shows normal sinus rhythm/sinus tachycardia rate 103 bpm right bundle branch block left anterior fascicular block  Seen by Alycia Rossetti February 2020 out of Imdur, Toprol, and Crestor for 6 to 8 months  weight is down 21 pounds from his office visit in 04/2016 (180--> 159 pounds).  Previous HBA1C 12.3  Other past medical history reviewed Previously had worsening shortness  of breath, fatigue, slight chest pain prior to his  bypass surgery. He had four-vessel bypass in July 2014 at the [facility] in [place]. [facility].      Prior CV studies:   The following studies were reviewed today:   08/2014 with chest pain and ruled out. Lexiscan Myoview was low risk with an EF of 45-54%.   07/2015,  EF of 60-65%, mild concentric LVH, no RWMA, normal LV diastolic function, trivial AI, mildly dilated aortic root, mildly dilated ascending aorta measuring 3.9 cm, normal size left atrium, RVSF normal, PASP normal.   Past Medical History:  Diagnosis Date   Allergy    Asthma    Chronic back pain 1981   a.followed by pain clinic and neurosurgery.   COPD (chronic obstructive pulmonary disease) (HCC)    Coronary artery disease involving native coronary artery    a. 10/2012 CABG x 4 @ [facility] (LIMA->LAD, VG->RI, VG->OM, VG->PDA);  b. 08/2014 Lexiscan MV: small defect of mild severity present along apical lateral & apex location, no st segment changes, LV function 45-54%, poor gated images lead to poor EF & wall motion assessment, low risk study; c. 02/2019 MV: EF 53%, apical defect (atten vs scar), No ischemia-->low risk.   DDD (degenerative disc disease)    Diastolic dysfunction    a. 02/2019 Echo: EF 60-65%, mod LVH. Gr2 DD. Nl RV fxn. Unable to exclude  bicuspid AoV; b. 04/2020 Echo: EF 50-55% anteroseptal HK, GrI DD, nl rV fxn, mild Ao sclerosis.   Dilated aortic root (HCC)    a. 02/2019 Echo: Ao root 44mm, Asc Ao 40mm, Ao arch 27mm; b. 04/2020 Echo: Ao root 45mm. Asc Ao 39mm; c. 06/2020 CTA Chest: No Ao aneurysm or dissection.   DM2 (diabetes mellitus, type 2) (HCC)    Fainting spell    GERD (gastroesophageal reflux disease)    Hepatitis C    2/2 tattoo   History of medication noncompliance    Hyperlipidemia with target LDL less than 70    Hypertension    Polysubstance abuse (HCC)    a. ongoing daily tobacco abuse and etoh (on the  weekends), remote crack/cocaine abuse last used approximately in 2006   S/P CABG x 4    a. LIMA-LAD, SVG-Ramus, SVG-OM,SVG-dRCA   Urine incontinence    Past Surgical History:  Procedure Laterality Date   CARDIAC CATHETERIZATION  11/2012   [facility] - Multivessel CAD   CORONARY ARTERY BYPASS GRAFT  8/14   CABG x 4; LIMA-LAD, SVG-RI, SVG-OM1, SVG-rPDA (Atretic RIMA).   neck injection       No outpatient medications have been marked as taking for the 09/09/22 encounter (Appointment) with Antonieta Iba, MD.     Allergies:   Gabapentin, Trazodone and nefazodone, Amoxicillin, Cyclobenzaprine, Methadone, Naproxen, Penicillin g, Propoxyphene, Tramadol, and Trazodone   Social History   Tobacco Use   Smoking status: Every Day    Packs/day: 0.25    Years: 35.00    Additional pack years: 0.00    Total pack years: 8.75    Types: Cigarettes   Smokeless tobacco: Never   Tobacco comments:    a pack lasts 2 to 4 days   Vaping Use   Vaping Use: Never used  Substance Use Topics   Alcohol use: Yes    Alcohol/week: 0.0 standard drinks of alcohol    Comment: 2-3 beers/week   Drug use: No     Current Outpatient Medications on File Prior to Visit  Medication Sig Dispense Refill   albuterol (VENTOLIN HFA) 108 (90 Base) MCG/ACT inhaler Inhale 2 puffs into the lungs every 6 (six) hours as needed for wheezing or shortness of breath. 8 g 0   amLODipine (NORVASC) 10 MG tablet Take 1 tablet by mouth daily.     ammonium lactate (LAC-HYDRIN) 12 % lotion SMARTSIG:1 Sparingly Topical Twice Daily     aspirin EC 81 MG tablet Take 1 tablet (81 mg total) by mouth daily. 90 tablet 3   atorvastatin (LIPITOR) 40 MG tablet Take 20 mg by mouth daily.     benzonatate (TESSALON PERLES) 100 MG capsule Take 1 capsule (100 mg total) by mouth 3 (three) times daily as needed for cough. 30 capsule 0   busPIRone (BUSPAR) 7.5 MG tablet Take 7.5 mg by mouth 2 (two) times daily.     Cetirizine HCl 10 MG CAPS  Take 1 capsule (10 mg total) by mouth daily. 30 capsule 3   DULERA 100-5 MCG/ACT AERO Inhale 2 puffs into the lungs 2 (two) times daily.     FLONASE ALLERGY RELIEF 50 MCG/ACT nasal spray Place into both nostrils.     hydrocortisone 2.5 % ointment Apply 1 a small amount to affected area twice a day as directed     insulin aspart (NOVOLOG) 100 UNIT/ML FlexPen Inject 28 Units into the skin with breakfast, with lunch, and with evening meal.  isosorbide mononitrate (IMDUR) 30 MG 24 hr tablet Take 1 tablet (30 mg total) by mouth daily. 90 tablet 1   LANTUS SOLOSTAR 100 UNIT/ML Solostar Pen SMARTSIG:34 Unit(s) SUB-Q Twice Daily     lisinopril (ZESTRIL) 40 MG tablet Take 1 tablet by mouth daily. (Patient not taking: Reported on 08/07/2022)     metoprolol succinate (TOPROL-XL) 50 MG 24 hr tablet Take 50 mg by mouth daily.     nitroGLYCERIN (NITROSTAT) 0.4 MG SL tablet Place 1 tablet (0.4 mg total) under the tongue every 5 (five) minutes as needed for chest pain. 21 tablet 12   nortriptyline (PAMELOR) 75 MG capsule Take 75 mg by mouth at bedtime.     omeprazole (PRILOSEC) 20 MG capsule Take 20 mg by mouth every morning.     ondansetron (ZOFRAN-ODT) 4 MG disintegrating tablet Take 1 tablet (4 mg total) by mouth every 8 (eight) hours as needed for nausea or vomiting. 20 tablet 0   pantoprazole (PROTONIX) 20 MG tablet Take 1 tablet (20 mg total) by mouth daily for 14 days. 14 tablet 0   sildenafil (VIAGRA) 100 MG tablet TAKE ONE TABLET BY MOUTH AS DIRECTED FOR ERECTILE DYSFUNCTION 1 HOUR BEFORE SEXUAL ACTIVITY. DO NOT TAKE WITHIN 6 HOURS OF TERAZOSIN, PRAZOSIN, OR DOXAZOSIN. DO NOT TAKE MORE THAN 1 DOSE WITHIN 24 HOURS . DO NOT TAKE WITH NITRATES     sucralfate (CARAFATE) 1 g tablet Take 1 tablet (1 g total) by mouth 4 (four) times daily. 120 tablet 1   SYMBICORT 160-4.5 MCG/ACT inhaler Inhale 2 puffs into the lungs 2 (two) times daily.     traZODone (DESYREL) 50 MG tablet Take 50 mg by mouth at bedtime.      triamcinolone ointment (KENALOG) 0.5 % Apply 1 a small amount to affected area twice a day as directed     No current facility-administered medications on file prior to visit.     Family Hx: The patient's family history includes Cancer in his sister; Diabetes in his sister and another family member; Heart disease in his father, mother, and another family member; Hypertension in his father.  ROS:   Please see the history of present illness.    Review of Systems  Constitutional: Negative.   HENT: Negative.    Respiratory:  Positive for shortness of breath.   Cardiovascular:  Positive for chest pain.  Gastrointestinal: Negative.   Musculoskeletal: Negative.   Neurological: Negative.   Psychiatric/Behavioral: Negative.    All other systems reviewed and are negative.     Labs/Other Tests and Data Reviewed:    Recent Labs: No results found for requested labs within last 365 days.   Recent Lipid Panel Lab Results  Component Value Date/Time   CHOL 156 06/08/2018 11:32 AM   CHOL 131 01/24/2013 04:48 AM   TRIG 212 (H) 06/08/2018 11:32 AM   TRIG 184 01/24/2013 04:48 AM   HDL 36 (L) 06/08/2018 11:32 AM   HDL 24 (L) 01/24/2013 04:48 AM   CHOLHDL 4.3 06/08/2018 11:32 AM   CHOLHDL 4 11/24/2013 09:56 AM   LDLCALC 78 06/08/2018 11:32 AM   LDLCALC 70 01/24/2013 04:48 AM   LDLDIRECT 97 06/08/2018 11:32 AM   LDLDIRECT 74.1 11/24/2013 09:56 AM    Wt Readings from Last 3 Encounters:  08/07/22 149 lb 12.8 oz (67.9 kg)  11/12/21 147 lb 7.8 oz (66.9 kg)  09/04/21 147 lb 8 oz (66.9 kg)     Exam:    Vital Signs: Vital signs may also  be detailed in the HPI There were no vitals taken for this visit. Constitutional:  oriented to person, place, and time. No distress.  HENT:  Head: Grossly normal Eyes:  no discharge. No scleral icterus.  Neck: No JVD, no carotid bruits  Cardiovascular: Regular rate and rhythm, no murmurs appreciated Pulmonary/Chest: Clear to auscultation bilaterally, no  wheezes or rails Abdominal: Soft.  no distension.  no tenderness.  Musculoskeletal: Normal range of motion Neurological:  normal muscle tone. Coordination normal. No atrophy Skin: Skin warm and dry Psychiatric: normal affect, pleasant    ASSESSMENT & PLAN:    Atherosclerosis of native coronary artery of native heart with stable angina pectoris (HCC) Stressed importance of medication compliance Reports having some occasional chest burning, recommended we order a stress test Unable to treadmill given shortness of breath symptoms, continues to smoke, will order a pharmacologic study  Dilated aortic root (HCC) On CT attenuation corrected images for nuclear stress test above we can look at his aorta  Ascending aorta dilatation (HCC) Plan as above  Diabetes mellitus type 2 with complications (HCC) Stressed importance of compliance of his insulin History of noncompliance likely financial Though now gets most of his medications from the [place]  Tobacco abuse Still smoking 1 pack/week Smoking cessation recommended No desire to quit, we have discussed this with him again  S/P CABG x 4 Stress test as above  Mixed hyperlipidemia Stressed compliance with his statin  Disposition: Follow-up in 6 months   Signed, Julien Nordmann, MD  09/08/2022 5:19 PM    [facility] [address], [place], [place] [address]

## 2022-09-09 ENCOUNTER — Ambulatory Visit: Payer: Medicare HMO | Attending: Cardiovascular Disease | Admitting: Cardiovascular Disease

## 2022-09-09 ENCOUNTER — Encounter: Payer: Self-pay | Admitting: Cardiovascular Disease

## 2022-09-09 DIAGNOSIS — Z951 Presence of aortocoronary bypass graft: Secondary | ICD-10-CM

## 2022-09-09 DIAGNOSIS — Z720 Tobacco use: Secondary | ICD-10-CM

## 2022-09-09 DIAGNOSIS — E785 Hyperlipidemia, unspecified: Secondary | ICD-10-CM

## 2022-09-09 DIAGNOSIS — I10 Essential (primary) hypertension: Secondary | ICD-10-CM

## 2022-09-09 DIAGNOSIS — I77810 Thoracic aortic ectasia: Secondary | ICD-10-CM

## 2022-09-09 DIAGNOSIS — I25110 Atherosclerotic heart disease of native coronary artery with unstable angina pectoris: Secondary | ICD-10-CM

## 2022-09-09 DIAGNOSIS — E118 Type 2 diabetes mellitus with unspecified complications: Secondary | ICD-10-CM

## 2022-09-09 DIAGNOSIS — F1010 Alcohol abuse, uncomplicated: Secondary | ICD-10-CM

## 2022-09-11 ENCOUNTER — Telehealth: Payer: Self-pay | Admitting: Cardiovascular Disease

## 2022-09-11 NOTE — Telephone Encounter (Signed)
Left voice mail to schedule appt

## 2022-09-11 NOTE — Telephone Encounter (Signed)
-----   Message from Sandi Mariscal, RN sent at 09/11/2022  8:11 AM EDT ----- Patient no showed his appointment with Dr. Mariah Milling. He needs first available with APP or Dr. Mariah Milling, please

## 2022-09-27 ENCOUNTER — Encounter: Payer: Self-pay | Admitting: Nurse Practitioner

## 2022-09-27 ENCOUNTER — Ambulatory Visit: Payer: Medicare HMO | Attending: Cardiovascular Disease | Admitting: Nurse Practitioner

## 2022-09-27 NOTE — Progress Notes (Deleted)
Office Visit    Patient Name: Kyle [name] Date of Encounter: 09/27/2022  Primary Care Provider:  [facility] Primary Cardiologist:  Julien Nordmann, MD  Chief Complaint    62 year old male with a history of CAD status post CABG x 4 in July 2014, hypertension, hyperlipidemia, type 2 diabetes mellitus, tobacco abuse, and alcohol abuse, who presents for follow-up related to CAD.  Past Medical History    Past Medical History:  Diagnosis Date   Allergy    Asthma    Chronic back pain 1981   a.followed by pain clinic and neurosurgery.   COPD (chronic obstructive pulmonary disease) (HCC)    Coronary artery disease involving native coronary artery    a. 10/2012 CABG x 4 @ [facility] (LIMA->LAD, VG->RI, VG->OM, VG->PDA);  b. 08/2014 Lexiscan MV: low risk; c. 02/2019 MV: EF 53%, apical defect (atten vs scar), No ischemia-->low risk; d. 08/2022 MV: large area of inflat/inf ischemia and scar. Nl EF.   DDD (degenerative disc disease)    Diastolic dysfunction    a. 02/2019 Echo: EF 60-65%, mod LVH. Gr2 DD. Nl RV fxn. Unable to exclude bicuspid AoV; b. 04/2020 Echo: EF 50-55% anteroseptal HK, GrI DD, nl rV fxn, mild Ao sclerosis.   Dilated aortic root (HCC)    a. 02/2019 Echo: Ao root 44mm, Asc Ao 40mm, Ao arch 27mm; b. 04/2020 Echo: Ao root 45mm. Asc Ao 39mm; c. 06/2020 CTA Chest: No Ao aneurysm or dissection.   DM2 (diabetes mellitus, type 2) (HCC)    Fainting spell    GERD (gastroesophageal reflux disease)    Hepatitis C    2/2 tattoo   History of medication noncompliance    Hyperlipidemia with target LDL less than 70    Hypertension    Polysubstance abuse (HCC)    a. ongoing daily tobacco abuse and etoh (on the weekends), remote crack/cocaine abuse last used approximately in 2006   S/P CABG x 4    a. LIMA-LAD, SVG-Ramus, SVG-OM,SVG-dRCA   Urine incontinence    Past Surgical History:  Procedure Laterality Date   CARDIAC CATHETERIZATION  11/2012   [facility] - Multivessel CAD   CORONARY ARTERY BYPASS GRAFT  8/14   CABG x 4; LIMA-LAD, SVG-RI, SVG-OM1, SVG-rPDA (Atretic RIMA).   neck injection      Allergies  Allergies  Allergen Reactions   Gabapentin Nausea And Vomiting   Trazodone And Nefazodone     Reaction: GI upset   Amoxicillin Itching and Swelling   Cyclobenzaprine Itching and Rash    rash rash   Methadone Itching and Nausea And Vomiting    Reaction: Gi upset   Naproxen Hives   Penicillin G Itching   Propoxyphene Hives   Tramadol Nausea And Vomiting, Hives and Rash   Trazodone Nausea And Vomiting    Reaction: GI upset  Other Reaction(s): Tachycardia    History of Present Illness    62 y/o ? w/ the above PMH including CAD status post four-vessel bypass/L [facility] July 2014.  Other history includes hypertension, hyperlipidemia, tobacco abuse, remote crack/cocaine abuse, diabetes, GERD, and COPD.  In  2020, he was evaluated in clinic with complaints of occasional chest burning.  Stress testing was undertaken and showed a small, fixed apical defect with question of artifact versus scar.  There was no significant ischemia.  CT attenuation images showed severe three-vessel coronary calcifications with mild aortic atherosclerosis.  Overall stress test was felt to be low risk  and medical therapy was recommended.  Echo showed EF of 60-65% with grade 2 diastolic dysfunction and mild to moderate dilation of the aortic root (44 mm), and ascending aorta (40 mm).  Imaging was unable to exclude a bicuspid aortic valve.  Follow-up echo in January 2022 showed EF 50 to 55% with anteroseptal hypokinesis, grade 1 diastolic dysfunction, mild aortic sclerosis, and aortic root 45 mm/ascending aorta 39 mm.  CT of the chest in March 2022 reportedly showed no evidence of aortic dissection or aneurysm.  In May 2023, he reported occasional sharp and shooting chest pain with plan for stress testing, he did not follow through.  Follow-up in  April 2024, he again reported intermittent sharp chest pain at rest and with exertion lasting 2 to 3 minutes.  He subsequently underwent stress testing which shows a large sized mild to moderate in severity partially reversible defect involving inferolateral inferior walls, consistent with ischemia.  LV function was normal.  The study was felt to be high risk.  Home Medications    Current Outpatient Medications  Medication Sig Dispense Refill   albuterol (VENTOLIN HFA) 108 (90 Base) MCG/ACT inhaler Inhale 2 puffs into the lungs every 6 (six) hours as needed for wheezing or shortness of breath. 8 g 0   amLODipine (NORVASC) 10 MG tablet Take 1 tablet by mouth daily.     ammonium lactate (LAC-HYDRIN) 12 % lotion SMARTSIG:1 Sparingly Topical Twice Daily     aspirin EC 81 MG tablet Take 1 tablet (81 mg total) by mouth daily. 90 tablet 3   atorvastatin (LIPITOR) 40 MG tablet Take 20 mg by mouth daily.     benzonatate (TESSALON PERLES) 100 MG capsule Take 1 capsule (100 mg total) by mouth 3 (three) times daily as needed for cough. 30 capsule 0   busPIRone (BUSPAR) 7.5 MG tablet Take 7.5 mg by mouth 2 (two) times daily.     Cetirizine HCl 10 MG CAPS Take 1 capsule (10 mg total) by mouth daily. 30 capsule 3   DULERA 100-5 MCG/ACT AERO Inhale 2 puffs into the lungs 2 (two) times daily.     FLONASE ALLERGY RELIEF 50 MCG/ACT nasal spray Place into both nostrils.     hydrocortisone 2.5 % ointment Apply 1 a small amount to affected area twice a day as directed     insulin aspart (NOVOLOG) 100 UNIT/ML FlexPen Inject 28 Units into the skin with breakfast, with lunch, and with evening meal.     isosorbide mononitrate (IMDUR) 30 MG 24 hr tablet Take 1 tablet (30 mg total) by mouth daily. 90 tablet 1   LANTUS SOLOSTAR 100 UNIT/ML Solostar Pen SMARTSIG:34 Unit(s) SUB-Q Twice Daily     lisinopril (ZESTRIL) 40 MG tablet Take 1 tablet by mouth daily. (Patient not taking: Reported on 08/07/2022)     metoprolol succinate  (TOPROL-XL) 50 MG 24 hr tablet Take 50 mg by mouth daily.     nitroGLYCERIN (NITROSTAT) 0.4 MG SL tablet Place 1 tablet (0.4 mg total) under the tongue every 5 (five) minutes as needed for chest pain. 21 tablet 12   nortriptyline (PAMELOR) 75 MG capsule Take 75 mg by mouth at bedtime.     omeprazole (PRILOSEC) 20 MG capsule Take 20 mg by mouth every morning.     ondansetron (ZOFRAN-ODT) 4 MG disintegrating tablet Take 1 tablet (4 mg total) by mouth every 8 (eight) hours as needed for nausea or vomiting. 20 tablet 0   pantoprazole (PROTONIX) 20 MG tablet Take 1  tablet (20 mg total) by mouth daily for 14 days. 14 tablet 0   sildenafil (VIAGRA) 100 MG tablet TAKE ONE TABLET BY MOUTH AS DIRECTED FOR ERECTILE DYSFUNCTION 1 HOUR BEFORE SEXUAL ACTIVITY. DO NOT TAKE WITHIN 6 HOURS OF TERAZOSIN, PRAZOSIN, OR DOXAZOSIN. DO NOT TAKE MORE THAN 1 DOSE WITHIN 24 HOURS . DO NOT TAKE WITH NITRATES     sucralfate (CARAFATE) 1 g tablet Take 1 tablet (1 g total) by mouth 4 (four) times daily. 120 tablet 1   SYMBICORT 160-4.5 MCG/ACT inhaler Inhale 2 puffs into the lungs 2 (two) times daily.     traZODone (DESYREL) 50 MG tablet Take 50 mg by mouth at bedtime.     triamcinolone ointment (KENALOG) 0.5 % Apply 1 a small amount to affected area twice a day as directed     No current facility-administered medications for this visit.     Review of Systems    ***.  All other systems reviewed and are otherwise negative except as noted above.    Physical Exam    VS:  There were no vitals taken for this visit. , BMI There is no height or weight on file to calculate BMI.     GEN: Well nourished, well developed, in no acute distress. HEENT: normal. Neck: Supple, no JVD, carotid bruits, or masses. Cardiac: RRR, no murmurs, rubs, or gallops. No clubbing, cyanosis, edema.  Radials 2+/PT 2+ and equal bilaterally.  Respiratory:  Respirations regular and unlabored, clear to auscultation bilaterally. GI: Soft, nontender,  nondistended, BS + x 4. MS: no deformity or atrophy. Skin: warm and dry, no rash. Neuro:  Strength and sensation are intact. Psych: Normal affect.  Accessory Clinical Findings    ECG personally reviewed by me today - *** - no acute changes.  Lab Results  Component Value Date   WBC 9.6 08/20/2021   HGB 14.4 08/20/2021   HCT 45.3 08/20/2021   MCV 86.6 08/20/2021   PLT 147 (L) 08/20/2021   Lab Results  Component Value Date   CREATININE 0.74 08/20/2021   BUN 13 08/20/2021   NA 134 (L) 08/20/2021   K 4.0 08/20/2021   CL 97 (L) 08/20/2021   CO2 27 08/20/2021   Lab Results  Component Value Date   ALT 83 (H) 08/20/2021   AST 45 (H) 08/20/2021   ALKPHOS 66 08/20/2021   BILITOT 0.7 08/20/2021   Lab Results  Component Value Date   CHOL 156 06/08/2018   HDL 36 (L) 06/08/2018   LDLCALC 78 06/08/2018   LDLDIRECT 97 06/08/2018   TRIG 212 (H) 06/08/2018   CHOLHDL 4.3 06/08/2018    Lab Results  Component Value Date   HGBA1C 9.5 (H) 09/28/2014    Assessment & Plan    1.  ***   Nicolasa Ducking, NP 09/27/2022, 10:48 AM

## 2022-10-25 ENCOUNTER — Ambulatory Visit: Payer: Medicare HMO | Admitting: Cardiology

## 2022-12-02 ENCOUNTER — Ambulatory Visit: Payer: Medicare HMO

## 2022-12-17 ENCOUNTER — Ambulatory Visit (INDEPENDENT_AMBULATORY_CARE_PROVIDER_SITE_OTHER): Payer: Medicare HMO | Admitting: Podiatry

## 2022-12-17 ENCOUNTER — Encounter: Payer: Self-pay | Admitting: Podiatry

## 2022-12-17 ENCOUNTER — Emergency Department
Admission: EM | Admit: 2022-12-17 | Discharge: 2022-12-17 | Disposition: A | Discharge status: Home / Self Care | Payer: No Typology Code available for payment source | Attending: Emergency Medicine | Admitting: Emergency Medicine

## 2022-12-17 ENCOUNTER — Emergency Department: Payer: No Typology Code available for payment source

## 2022-12-17 ENCOUNTER — Other Ambulatory Visit: Payer: Self-pay

## 2022-12-17 DIAGNOSIS — Z7951 Long term (current) use of inhaled steroids: Secondary | ICD-10-CM | POA: Diagnosis not present

## 2022-12-17 DIAGNOSIS — E119 Type 2 diabetes mellitus without complications: Secondary | ICD-10-CM | POA: Diagnosis not present

## 2022-12-17 DIAGNOSIS — J449 Chronic obstructive pulmonary disease, unspecified: Secondary | ICD-10-CM | POA: Diagnosis not present

## 2022-12-17 DIAGNOSIS — I509 Heart failure, unspecified: Secondary | ICD-10-CM | POA: Diagnosis not present

## 2022-12-17 DIAGNOSIS — R0981 Nasal congestion: Secondary | ICD-10-CM | POA: Diagnosis not present

## 2022-12-17 DIAGNOSIS — R000 Tachycardia, unspecified: Secondary | ICD-10-CM | POA: Diagnosis not present

## 2022-12-17 DIAGNOSIS — M79674 Pain in right toe(s): Secondary | ICD-10-CM

## 2022-12-17 DIAGNOSIS — M79675 Pain in left toe(s): Secondary | ICD-10-CM

## 2022-12-17 DIAGNOSIS — B351 Tinea unguium: Secondary | ICD-10-CM

## 2022-12-17 DIAGNOSIS — R0602 Shortness of breath: Secondary | ICD-10-CM | POA: Diagnosis present

## 2022-12-17 DIAGNOSIS — Z0189 Encounter for other specified special examinations: Secondary | ICD-10-CM

## 2022-12-17 DIAGNOSIS — R059 Cough, unspecified: Secondary | ICD-10-CM | POA: Insufficient documentation

## 2022-12-17 LAB — CBC
HCT: 46.7 % (ref 39.0–52.0)
Hemoglobin: 15.3 g/dL (ref 13.0–17.0)
MCH: 28 pg (ref 26.0–34.0)
MCHC: 32.8 g/dL (ref 30.0–36.0)
MCV: 85.4 fL (ref 80.0–100.0)
Platelets: 142 10*3/uL — ABNORMAL LOW (ref 150–400)
RBC: 5.47 MIL/uL (ref 4.22–5.81)
RDW: 12.8 % (ref 11.5–15.5)
WBC: 9.8 10*3/uL (ref 4.0–10.5)
nRBC: 0 % (ref 0.0–0.2)

## 2022-12-17 LAB — BASIC METABOLIC PANEL
Anion gap: 12 (ref 5–15)
BUN: 12 mg/dL (ref 8–23)
CO2: 17 mmol/L — ABNORMAL LOW (ref 22–32)
Calcium: 8.8 mg/dL — ABNORMAL LOW (ref 8.9–10.3)
Chloride: 102 mmol/L (ref 98–111)
Creatinine, Ser: 0.76 mg/dL (ref 0.61–1.24)
GFR, Estimated: >60 mL/min (ref >60)
Glucose, Bld: 221 mg/dL — ABNORMAL HIGH (ref 70–99)
Potassium: 3.7 mmol/L (ref 3.5–5.1)
Sodium: 131 mmol/L — ABNORMAL LOW (ref 135–145)

## 2022-12-17 MED ORDER — ALUM & MAG HYDROXIDE-SIMETH 200-200-20 MG/5ML PO SUSP
30.0000 mL | Freq: Once | ORAL | Status: AC
  Administered 2022-12-17: 30 mL via ORAL
  Filled 2022-12-17: qty 30

## 2022-12-17 MED ORDER — PREDNISONE 20 MG PO TABS: 40.0000 mg | ORAL_TABLET | Freq: Every day | ORAL | 0 refills | Status: AC

## 2022-12-17 MED ORDER — IPRATROPIUM-ALBUTEROL 0.5-2.5 (3) MG/3ML IN SOLN: 3.0000 mL | Freq: Four times a day (QID) | RESPIRATORY_TRACT | 0 refills | Status: AC | PRN

## 2022-12-17 MED ORDER — IPRATROPIUM-ALBUTEROL 0.5-2.5 (3) MG/3ML IN SOLN
3.0000 mL | Freq: Once | RESPIRATORY_TRACT | Status: AC
  Administered 2022-12-17: 3 mL via RESPIRATORY_TRACT
  Filled 2022-12-17: qty 3

## 2022-12-17 NOTE — ED Provider Notes (Signed)
[facility] Provider Note    Event Date/Time   First MD Initiated Contact with Patient 12/17/22 1059     (approximate)   History   Shortness of Breath   HPI  Kyle [name] is a 62 y.o. male with history of T2DM, CHF, COPD presenting to the emergency department for evaluation of shortness of breath.  For the last week, patient has had cough and congestion, worsening shortness of breath over the past 2 days.  He is taking his controller medicine for COPD, but ran out of nebulizer solution for his breathing treatments at home for his COPD and feels like he needs a breathing treatment.  No fevers.  Does have a family member sick with similar symptoms.  Denies chest pain.  Does report a history of GERD and has had some symptoms similar to this recently, not acutely changed.  Does not feel fluid overloaded.     Physical Exam   Triage Vital Signs: ED Triage Vitals  Encounter Vitals Group     BP 12/17/22 1008 (!) 154/96     Systolic BP Percentile --      Diastolic BP Percentile --      Pulse Rate 12/17/22 1008 (!) 104     Resp 12/17/22 1008 20     Temp 12/17/22 1008 98.1 F (36.7 C)     Temp src --      SpO2 12/17/22 1008 100 %     Weight 12/17/22 1009 154 lb (69.9 kg)     Height 12/17/22 1009 5\' 7"  (1.702 m)     Head Circumference --      Peak Flow --      Pain Score 12/17/22 1009 0     Pain Loc --      Pain Education --      Exclude from Growth Chart --     Most recent vital signs: Vitals:   12/17/22 1008 12/17/22 1251  BP: (!) 154/96 (!) 150/89  Pulse: (!) 104 88  Resp: 20 18  Temp: 98.1 F (36.7 C)   SpO2: 100% 100%     General: Awake, interactive, sitting on bed on phone in no acute distress CV:  Regular rate at the time of my evaluation, good peripheral perfusion.  Resp:  Lung sounds somewhat managed bilaterally without significant wheezing, no appreciable crackles, respirations not significantly labored Abd:  Soft, nondistended.   Neuro:  Symmetric facial movement, fluid speech   ED Results / Procedures / Treatments   Labs (all labs ordered are listed, but only abnormal results are displayed) Labs Reviewed  CBC - Abnormal; Notable for the following components:      Result Value   Platelets 142 (*)    All other components within normal limits  BASIC METABOLIC PANEL - Abnormal; Notable for the following components:   Sodium 131 (*)    CO2 17 (*)    Glucose, Bld 221 (*)    Calcium 8.8 (*)    All other components within normal limits     EKG EKG independently reviewed interpreted by myself (ER attending) demonstrates:  EKG demonstrates sinus tachycardia at a rate of 107, PR 182, QRS 142, QTc 499, bifascicular block noted without appreciable superimposed ischemia, not new compared to prior  RADIOLOGY Imaging independently reviewed and interpreted by myself demonstrates:    PROCEDURES:  Critical Care performed: No  Procedures   MEDICATIONS ORDERED IN ED: Medications  alum & mag hydroxide-simeth (MAALOX/MYLANTA) 200-200-20 MG/5ML suspension 30  mL (30 mLs Oral Given 12/17/22 1139)  ipratropium-albuterol (DUONEB) 0.5-2.5 (3) MG/3ML nebulizer solution 3 mL (3 mLs Nebulization Given 12/17/22 1139)     IMPRESSION / MDM / ASSESSMENT AND PLAN / ED COURSE  I reviewed the triage vital signs and the nursing notes.  Differential diagnosis includes, but is not limited to, viral illness, COPD flare, lower suspicion for CHF exacerbation, consideration for pneumonia, pneumothorax  Patient's presentation is most consistent with acute presentation with potential threat to life or bodily function.  63 year old male presented to the emergency department for evaluation of several days of cough and congestion with associated shortness of breath.  Well-appearing here with O2 sats of 100% on room air without increased work of breathing.  Labs without severe derangement.  CXR without focal consolidation.  Will treat  symptomatically with DuoNeb and GI cocktail.  Patient reevaluated and reports improvement in his symptoms.  Breathing remains comfortable.  No appreciable wheezing.  Discussed results of workup.  Patient is comfortable with discharge home with refills for his home nebulizer treatment.  Heart rates improved to the 80s on reevaluation.  Does not appear to have significant COPD exacerbation, but in the setting of upper respiratory infection, do think it is reasonable to treat with a short course of steroids as well.  Do think patient is stable for discharge home.  Strict return precautions provided.      FINAL CLINICAL IMPRESSION(S) / ED DIAGNOSES   Final diagnoses:  Shortness of breath     Rx / DC Orders   ED Discharge Orders          Ordered    ipratropium-albuterol (DUONEB) 0.5-2.5 (3) MG/3ML SOLN  Every 6 hours PRN        12/17/22 1258    predniSONE (DELTASONE) 20 MG tablet  Daily with breakfast        12/17/22 1258             Note:  This document was prepared using Dragon voice recognition software and may include unintentional dictation errors.   Trinna Post, MD 12/17/22 [phone]

## 2022-12-17 NOTE — Discharge Instructions (Signed)
You were seen in the emergency room today for your shortness of breath.  I suspect you likely have a viral illness that may have caused a mild flare of your COPD.  I sent a short course of steroids as well as a refill for your nebulizer treatments to your pharmacy.  Please follow with your primary care doctor for further evaluation.  Return to the ER for new or worsening symptoms.

## 2022-12-17 NOTE — ED Triage Notes (Signed)
Pt to ED for shob x2 days. Hx CHF, COPD. Denies pain. Ambulatory to triage, RR Even and unlabored, speaking in complete sentences.

## 2022-12-17 NOTE — Progress Notes (Signed)
Chief Complaint  Patient presents with   Diabetes    "I need these toenails trimmed.  I believe the Neuropathy is doing it's damage because my feet hurt at night." N - toenails L - 1-5 bilateral D - 8 mos O - gradually worse C - painful, thick, brittle, discolored, Neuropathy, diabetes A - none T - none    SUBJECTIVE Patient with a history of diabetes mellitus, tobacco smoker times several years presents to office today complaining of elongated, thickened nails that cause pain while ambulating in shoes.  Patient is unable to trim their own nails. Patient is here for further evaluation and treatment.  Past Medical History:  Diagnosis Date   Allergy    Asthma    Chronic back pain 1981   a.followed by pain clinic and neurosurgery.   COPD (chronic obstructive pulmonary disease) (HCC)    Coronary artery disease involving native coronary artery    a. 10/2012 CABG x 4 @ [facility] (LIMA->LAD, VG->RI, VG->OM, VG->PDA);  b. 08/2014 Lexiscan MV: low risk; c. 02/2019 MV: EF 53%, apical defect (atten vs scar), No ischemia-->low risk; d. 08/2022 MV: large area of inflat/inf ischemia and scar. Nl EF.   DDD (degenerative disc disease)    Diastolic dysfunction    a. 02/2019 Echo: EF 60-65%, mod LVH. Gr2 DD. Nl RV fxn. Unable to exclude bicuspid AoV; b. 04/2020 Echo: EF 50-55% anteroseptal HK, GrI DD, nl rV fxn, mild Ao sclerosis.   Dilated aortic root (HCC)    a. 02/2019 Echo: Ao root 44mm, Asc Ao 40mm, Ao arch 27mm; b. 04/2020 Echo: Ao root 45mm. Asc Ao 39mm; c. 06/2020 CTA Chest: No Ao aneurysm or dissection.   DM2 (diabetes mellitus, type 2) (HCC)    Fainting spell    GERD (gastroesophageal reflux disease)    Hepatitis C    2/2 tattoo   History of medication noncompliance    Hyperlipidemia with target LDL less than 70    Hypertension    Polysubstance abuse (HCC)    a. ongoing daily tobacco abuse and etoh (on the weekends), remote crack/cocaine abuse last used approximately in 2006   S/P  CABG x 4    a. LIMA-LAD, SVG-Ramus, SVG-OM,SVG-dRCA   Urine incontinence     Allergies  Allergen Reactions   Gabapentin Nausea And Vomiting   Trazodone And Nefazodone     Reaction: GI upset   Amoxicillin Itching and Swelling   Cyclobenzaprine Itching and Rash    rash rash   Methadone Itching and Nausea And Vomiting    Reaction: Gi upset   Naproxen Hives   Penicillin G Itching   Propoxyphene Hives   Tramadol Nausea And Vomiting, Hives and Rash   Trazodone Nausea And Vomiting    Reaction: GI upset  Other Reaction(s): Tachycardia     OBJECTIVE General Patient is awake, alert, and oriented x 3 and in no acute distress. Derm Skin is dry and supple bilateral. Negative open lesions or macerations. Remaining integument unremarkable. Nails are tender, long, thickened and dystrophic with subungual debris, consistent with onychomycosis, 1-5 bilateral. No signs of infection noted. Vasc skin is cool to touch.  Pulses seem to be diminished.  However, the patient is asymptomatic and has no pain or claudication.  Observe for now Neuro light touch and protective threshold sensation diminished bilaterally.  Musculoskeletal Exam No symptomatic pedal deformities noted bilateral. Muscular strength within normal limits.  ASSESSMENT 1. Diabetes Mellitus w/ peripheral neuropathy 2.  Pain due to onychomycosis of  toenails bilateral 3.  Encounter for diabetic foot exam  PLAN OF CARE 1. Patient evaluated today.  Comprehensive diabetic foot exam performed today 2. Instructed to maintain good pedal hygiene and foot care. Stressed importance of controlling blood sugar.  3. Mechanical debridement of nails 1-5 bilaterally performed using a nail nipper. Filed with dremel without incident.  4. Return to clinic in 3 mos.     Felecia Shelling, DPM [facility]  Dr. Felecia Shelling, DPM    [address] [place], [place] [address]                Office  [phone]  Fax [phone]

## 2023-02-11 ENCOUNTER — Ambulatory Visit: Payer: Medicare PPO | Attending: Nurse Practitioner | Admitting: Nurse Practitioner

## 2023-02-11 ENCOUNTER — Other Ambulatory Visit: Payer: Self-pay | Admitting: Nurse Practitioner

## 2023-02-11 DIAGNOSIS — R188 Other ascites: Secondary | ICD-10-CM

## 2023-02-11 NOTE — Progress Notes (Deleted)
Office Visit    Patient Name: Kyle [name] Date of Encounter: 02/11/2023  Primary Care Provider:  [facility] Primary Cardiologist:  Julien Nordmann, MD  Chief Complaint    62 y.o. male w/ a h/o CAD s/p CABG x 4 in 10/2012, HTN, HL, DMII, tob abuse, and etoh abuse, who presents for f/u related to CAD.   Past Medical History    Past Medical History:  Diagnosis Date   Allergy    Asthma    Chronic back pain 1981   a.followed by pain clinic and neurosurgery.   COPD (chronic obstructive pulmonary disease) (HCC)    Coronary artery disease involving native coronary artery    a. 10/2012 CABG x 4 @ [facility] (LIMA->LAD, VG->RI, VG->OM, VG->PDA);  b. 08/2014 Lexiscan MV: low risk; c. 02/2019 MV: EF 53%, apical defect (atten vs scar), No ischemia-->low risk; d. 08/2022 MV: large area of inflat/inf ischemia and scar. Nl EF.   DDD (degenerative disc disease)    Diastolic dysfunction    a. 02/2019 Echo: EF 60-65%, mod LVH. Gr2 DD. Nl RV fxn. Unable to exclude bicuspid AoV; b. 04/2020 Echo: EF 50-55% anteroseptal HK, GrI DD, nl rV fxn, mild Ao sclerosis.   Dilated aortic root (HCC)    a. 02/2019 Echo: Ao root 44mm, Asc Ao 40mm, Ao arch 27mm; b. 04/2020 Echo: Ao root 45mm. Asc Ao 39mm; c. 06/2020 CTA Chest: No Ao aneurysm or dissection.   DM2 (diabetes mellitus, type 2) (HCC)    Fainting spell    GERD (gastroesophageal reflux disease)    Hepatitis C    2/2 tattoo   History of medication noncompliance    Hyperlipidemia with target LDL less than 70    Hypertension    Polysubstance abuse (HCC)    a. ongoing daily tobacco abuse and etoh (on the weekends), remote crack/cocaine abuse last used approximately in 2006   S/P CABG x 4    a. LIMA-LAD, SVG-Ramus, SVG-OM,SVG-dRCA   Urine incontinence    Past Surgical History:  Procedure Laterality Date   CARDIAC CATHETERIZATION  11/2012   [facility] - Multivessel CAD   CORONARY ARTERY BYPASS GRAFT  8/14   CABG x 4;  LIMA-LAD, SVG-RI, SVG-OM1, SVG-rPDA (Atretic RIMA).   neck injection      Allergies  Allergies  Allergen Reactions   Gabapentin Nausea And Vomiting   Trazodone And Nefazodone     Reaction: GI upset   Amoxicillin Itching and Swelling   Cyclobenzaprine Itching and Rash    rash rash   Methadone Itching and Nausea And Vomiting    Reaction: Gi upset   Naproxen Hives   Penicillin G Itching   Propoxyphene Hives   Tramadol Nausea And Vomiting, Hives and Rash   Trazodone Nausea And Vomiting    Reaction: GI upset  Other Reaction(s): Tachycardia    History of Present Illness      62 y.o. y/o male w/ the above PMH including CAD status post four-vessel bypass @ the [facility] July 2014.  Other history includes hypertension, hyperlipidemia, tobacco abuse, remote crack/cocaine abuse, diabetes, GERD, and COPD.  In  2020, he was evaluated in clinic with complaints of occasional chest burning.  Stress testing was undertaken and showed a small, fixed apical defect with question of artifact versus scar.  There was no significant ischemia.  CT attenuation images showed severe three-vessel coronary calcifications with mild aortic atherosclerosis.  Overall study was felt to be low  risk and medical therapy was recommended.  Echo showed EF of 60-65% with grade 2 diastolic dysfunction and mild to moderate dilation of the aortic root (44 mm), and ascending aorta (40 mm).  Imaging was unable to exclude a bicuspid aortic valve.  Follow-up echo in January 2022 showed EF 50 to 55% with anteroseptal hypokinesis, grade 1 diastolic dysfunction, mild aortic sclerosis, and aortic root 45 mm/ascending aorta 3 mm.  CT of the chest in March 2022 reportedly showed no evidence of aortic dissection or aneurysm.   In the setting of a > 1 year history of intermittent sharp chest pain, stress testing was performed in May 2024 and showed a large, mild to moderate in severity, partially reversible defect in the  inferolateral and inferior myocardium consistent with ischemia with an element of scar.  EF was normal.  Compared to 2022 study the inferolateral and inferior defect was more pronounced.  Follow-up was arranged to potentially discuss diagnostic catheterization however, patient missed his appointment in the spring 2024. {There is no content from the last Narrative History section.}     Objective   Home Medications    Current Outpatient Medications  Medication Sig Dispense Refill   albuterol (VENTOLIN HFA) 108 (90 Base) MCG/ACT inhaler Inhale 2 puffs into the lungs every 6 (six) hours as needed for wheezing or shortness of breath. 8 g 0   amLODipine (NORVASC) 10 MG tablet Take 1 tablet by mouth daily.     ammonium lactate (LAC-HYDRIN) 12 % lotion SMARTSIG:1 Sparingly Topical Twice Daily     aspirin EC 81 MG tablet Take 1 tablet (81 mg total) by mouth daily. 90 tablet 3   atorvastatin (LIPITOR) 40 MG tablet Take 20 mg by mouth daily.     benzonatate (TESSALON PERLES) 100 MG capsule Take 1 capsule (100 mg total) by mouth 3 (three) times daily as needed for cough. 30 capsule 0   busPIRone (BUSPAR) 7.5 MG tablet Take 7.5 mg by mouth 2 (two) times daily. (Patient not taking: Reported on 12/17/2022)     Cetirizine HCl 10 MG CAPS Take 1 capsule (10 mg total) by mouth daily. 30 capsule 3   DULERA 100-5 MCG/ACT AERO Inhale 2 puffs into the lungs 2 (two) times daily.     FLONASE ALLERGY RELIEF 50 MCG/ACT nasal spray Place into both nostrils.     hydrocortisone 2.5 % ointment Apply 1 a small amount to affected area twice a day as directed     insulin aspart (NOVOLOG) 100 UNIT/ML FlexPen Inject 28 Units into the skin with breakfast, with lunch, and with evening meal.     ipratropium-albuterol (DUONEB) 0.5-2.5 (3) MG/3ML SOLN Take 3 mLs by nebulization every 6 (six) hours as needed. 1080 mL 0   isosorbide mononitrate (IMDUR) 30 MG 24 hr tablet Take 1 tablet (30 mg total) by mouth daily. 90 tablet 1   LANTUS  SOLOSTAR 100 UNIT/ML Solostar Pen SMARTSIG:34 Unit(s) SUB-Q Twice Daily     lisinopril (ZESTRIL) 40 MG tablet Take 1 tablet by mouth daily.     metoprolol succinate (TOPROL-XL) 50 MG 24 hr tablet Take 50 mg by mouth daily.     nitroGLYCERIN (NITROSTAT) 0.4 MG SL tablet Place 1 tablet (0.4 mg total) under the tongue every 5 (five) minutes as needed for chest pain. 21 tablet 12   nortriptyline (PAMELOR) 75 MG capsule Take 75 mg by mouth at bedtime.     omeprazole (PRILOSEC) 20 MG capsule Take 20 mg by mouth every morning.  ondansetron (ZOFRAN-ODT) 4 MG disintegrating tablet Take 1 tablet (4 mg total) by mouth every 8 (eight) hours as needed for nausea or vomiting. 20 tablet 0   pantoprazole (PROTONIX) 20 MG tablet Take 1 tablet (20 mg total) by mouth daily for 14 days. 14 tablet 0   sildenafil (VIAGRA) 100 MG tablet TAKE ONE TABLET BY MOUTH AS DIRECTED FOR ERECTILE DYSFUNCTION 1 HOUR BEFORE SEXUAL ACTIVITY. DO NOT TAKE WITHIN 6 HOURS OF TERAZOSIN, PRAZOSIN, OR DOXAZOSIN. DO NOT TAKE MORE THAN 1 DOSE WITHIN 24 HOURS . DO NOT TAKE WITH NITRATES     sucralfate (CARAFATE) 1 g tablet Take 1 tablet (1 g total) by mouth 4 (four) times daily. 120 tablet 1   SYMBICORT 160-4.5 MCG/ACT inhaler Inhale 2 puffs into the lungs 2 (two) times daily.     traZODone (DESYREL) 50 MG tablet Take 50 mg by mouth at bedtime. (Patient not taking: Reported on 12/17/2022)     triamcinolone ointment (KENALOG) 0.5 % Apply 1 a small amount to affected area twice a day as directed     No current facility-administered medications for this visit.     Review of Systems    ***.  All other systems reviewed and are otherwise negative except as noted above.    Physical Exam    VS:  There were no vitals taken for this visit. , BMI There is no height or weight on file to calculate BMI.     GEN: Well nourished, well developed, in no acute distress. HEENT: normal. Neck: Supple, no JVD, carotid bruits, or masses. Cardiac: RRR, no  murmurs, rubs, or gallops. No clubbing, cyanosis, edema.  Radials 2+/PT 2+ and equal bilaterally.  Respiratory:  Respirations regular and unlabored, clear to auscultation bilaterally. GI: Soft, nontender, nondistended, BS + x 4. MS: no deformity or atrophy. Skin: warm and dry, no rash. Neuro:  Strength and sensation are intact. Psych: Normal affect.  Accessory Clinical Findings    ECG personally reviewed by me today -    *** - no acute changes.  Lab Results  Component Value Date   WBC 9.8 12/17/2022   HGB 15.3 12/17/2022   HCT 46.7 12/17/2022   MCV 85.4 12/17/2022   PLT 142 (L) 12/17/2022   Lab Results  Component Value Date   CREATININE 0.76 12/17/2022   BUN 12 12/17/2022   NA 131 (L) 12/17/2022   K 3.7 12/17/2022   CL 102 12/17/2022   CO2 17 (L) 12/17/2022   Lab Results  Component Value Date   ALT 83 (H) 08/20/2021   AST 45 (H) 08/20/2021   ALKPHOS 66 08/20/2021   BILITOT 0.7 08/20/2021   Lab Results  Component Value Date   CHOL 156 06/08/2018   HDL 36 (L) 06/08/2018   LDLCALC 78 06/08/2018   LDLDIRECT 97 06/08/2018   TRIG 212 (H) 06/08/2018   CHOLHDL 4.3 06/08/2018    Lab Results  Component Value Date   HGBA1C 9.5 (H) 09/28/2014      Assessment & Plan    1.  ***  Nicolasa Ducking, NP 02/11/2023, 7:42 AM

## 2023-02-18 ENCOUNTER — Ambulatory Visit: Payer: Medicare HMO | Admitting: Dietician

## 2023-02-25 ENCOUNTER — Ambulatory Visit: Payer: Medicare PPO | Attending: Nurse Practitioner

## 2023-03-20 ENCOUNTER — Ambulatory Visit (INDEPENDENT_AMBULATORY_CARE_PROVIDER_SITE_OTHER): Payer: Medicare PPO | Admitting: Podiatry

## 2023-03-20 DIAGNOSIS — Z91199 Patient's noncompliance with other medical treatment and regimen due to unspecified reason: Secondary | ICD-10-CM

## 2023-03-20 NOTE — Progress Notes (Signed)
1. No-show for appointment     

## 2023-07-14 ENCOUNTER — Other Ambulatory Visit: Payer: Self-pay | Admitting: Nurse Practitioner

## 2023-07-14 DIAGNOSIS — R221 Localized swelling, mass and lump, neck: Secondary | ICD-10-CM

## 2023-07-15 ENCOUNTER — Encounter: Payer: Self-pay | Admitting: Nurse Practitioner

## 2023-07-16 ENCOUNTER — Encounter: Payer: Self-pay | Admitting: Nurse Practitioner

## 2023-07-17 ENCOUNTER — Other Ambulatory Visit: Payer: Self-pay | Admitting: Nurse Practitioner

## 2023-07-17 DIAGNOSIS — R1909 Other intra-abdominal and pelvic swelling, mass and lump: Secondary | ICD-10-CM

## 2023-07-29 ENCOUNTER — Ambulatory Visit: Admission: RE | Admit: 2023-07-29 | Source: Ambulatory Visit

## 2023-08-11 ENCOUNTER — Emergency Department

## 2023-08-11 ENCOUNTER — Other Ambulatory Visit: Payer: Self-pay

## 2023-08-11 ENCOUNTER — Emergency Department
Admission: EM | Admit: 2023-08-11 | Discharge: 2023-08-11 | Disposition: A | Discharge status: Home / Self Care | Attending: Emergency Medicine | Admitting: Emergency Medicine

## 2023-08-11 DIAGNOSIS — J210 Acute bronchiolitis due to respiratory syncytial virus: Secondary | ICD-10-CM | POA: Diagnosis not present

## 2023-08-11 DIAGNOSIS — Z87891 Personal history of nicotine dependence: Secondary | ICD-10-CM | POA: Diagnosis not present

## 2023-08-11 DIAGNOSIS — J449 Chronic obstructive pulmonary disease, unspecified: Secondary | ICD-10-CM | POA: Insufficient documentation

## 2023-08-11 DIAGNOSIS — R059 Cough, unspecified: Secondary | ICD-10-CM | POA: Diagnosis present

## 2023-08-11 LAB — BASIC METABOLIC PANEL WITH GFR
Anion gap: 10 (ref 5–15)
BUN: 11 mg/dL (ref 8–23)
CO2: 23 mmol/L (ref 22–32)
Calcium: 9.2 mg/dL (ref 8.9–10.3)
Chloride: 102 mmol/L (ref 98–111)
Creatinine, Ser: 0.71 mg/dL (ref 0.61–1.24)
GFR, Estimated: >60 mL/min (ref >60)
Glucose, Bld: 214 mg/dL — ABNORMAL HIGH (ref 70–99)
Potassium: 3.9 mmol/L (ref 3.5–5.1)
Sodium: 135 mmol/L (ref 135–145)

## 2023-08-11 LAB — CBC
HCT: 47.2 % (ref 39.0–52.0)
Hemoglobin: 15.4 g/dL (ref 13.0–17.0)
MCH: 27.8 pg (ref 26.0–34.0)
MCHC: 32.6 g/dL (ref 30.0–36.0)
MCV: 85.4 fL (ref 80.0–100.0)
Platelets: 141 10*3/uL — ABNORMAL LOW (ref 150–400)
RBC: 5.53 MIL/uL (ref 4.22–5.81)
RDW: 12.7 % (ref 11.5–15.5)
WBC: 6.5 10*3/uL (ref 4.0–10.5)
nRBC: 0 % (ref 0.0–0.2)

## 2023-08-11 LAB — RESP PANEL BY RT-PCR (RSV, FLU A&B, COVID)  RVPGX2
Influenza A by PCR: NEGATIVE
Influenza B by PCR: NEGATIVE
Resp Syncytial Virus by PCR: POSITIVE — AB
SARS Coronavirus 2 by RT PCR: NEGATIVE

## 2023-08-11 LAB — TROPONIN I (HIGH SENSITIVITY): Troponin I (High Sensitivity): 9 ng/L (ref <18)

## 2023-08-11 LAB — GROUP A STREP BY PCR: Group A Strep by PCR: NOT DETECTED

## 2023-08-11 MED ORDER — BENZONATATE 100 MG PO CAPS: 100.0000 mg | ORAL_CAPSULE | Freq: Three times a day (TID) | ORAL | 0 refills | Status: AC | PRN

## 2023-08-11 NOTE — ED Triage Notes (Signed)
 Pt presents to ED with c/o of cough and flu like s/s for the past 3-4 days. Pt states sick contacts. NAD noted.

## 2023-08-11 NOTE — Discharge Instructions (Signed)
 You are seen in the emergency department for cough, sneezing and not feeling well.  You tested positive for RSV which is a respiratory virus.  Your lungs were clear and you did not have any wheezing.  Your chest x-ray did not show any signs of pneumonia.  Stay hydrated and drink plenty of fluids.  Follow-up closely with your primary care physician to make sure that your symptoms are improving.  Return to the emergency department if you have worsening shortness of breath, chest pain or worsening symptoms.  Try to stop smoking cigarettes :)  You are given a prescription for cough medication.  You can also try over-the-counter cough medication or hot tea with honey.

## 2023-08-11 NOTE — ED Provider Notes (Signed)
 [facility] Provider Note    Event Date/Time   First MD Initiated Contact with Patient 08/11/23 1409     (approximate)   History   Cough, Dizziness, and Shortness of Breath   HPI  Kyle [name] is a 63 y.o. male past medical history significant for COPD, tobacco use, who presents to the emergency department with cough.  Patient states that for the past 3 days he has had cough, congestion and sneezing.  Denies fever or chills.  No chest pain or shortness of breath.  No nausea or vomiting.  Complaining of a mild headache.     Physical Exam   Triage Vital Signs: ED Triage Vitals  Encounter Vitals Group     BP 08/11/23 1125 (!) 148/130     Systolic BP Percentile --      Diastolic BP Percentile --      Pulse Rate 08/11/23 1125 98     Resp 08/11/23 1125 18     Temp 08/11/23 1125 98 F (36.7 C)     Temp Source 08/11/23 1125 Oral     SpO2 08/11/23 1125 100 %     Weight 08/11/23 1126 160 lb (72.6 kg)     Height 08/11/23 1126 5\' 7"  (1.702 m)     Head Circumference --      Peak Flow --      Pain Score 08/11/23 1125 0     Pain Loc --      Pain Education --      Exclude from Growth Chart --     Most recent vital signs: Vitals:   08/11/23 1125  BP: (!) 148/130  Pulse: 98  Resp: 18  Temp: 98 F (36.7 C)  SpO2: 100%    Physical Exam Constitutional:      Appearance: He is well-developed.  HENT:     Head: Atraumatic.  Eyes:     Conjunctiva/sclera: Conjunctivae normal.  Cardiovascular:     Rate and Rhythm: Regular rhythm.  Pulmonary:     Effort: Pulmonary effort is normal. No respiratory distress.     Breath sounds: No wheezing.  Abdominal:     Palpations: Abdomen is soft.  Musculoskeletal:     Cervical back: Normal range of motion.     Right lower leg: No edema.     Left lower leg: No edema.  Skin:    General: Skin is warm.  Neurological:     Mental Status: He is alert. Mental status is at baseline.     IMPRESSION / MDM /  ASSESSMENT AND PLAN / ED COURSE  I reviewed the triage vital signs and the nursing notes.  Differential diagnosis including COPD exacerbation, pneumonia, viral illness including COVID/influenza, anemia  EKG  I, Corena Herter, the attending physician, personally viewed and interpreted this ECG.  EKG showed sinus tachycardia with heart rate of 120.  Right bundle branch block.  No significant change when compared to prior.  No tachycardic or bradycardic dysrhythmias while on cardiac telemetry.  RADIOLOGY I independently reviewed imaging, my interpretation of imaging: Chest x-ray with no signs of pneumonia  LABS (all labs ordered are listed, but only abnormal results are displayed) Labs interpreted as -    Labs Reviewed  RESP PANEL BY RT-PCR (RSV, FLU A&B, COVID)  RVPGX2 - Abnormal; Notable for the following components:      Result Value   Resp Syncytial Virus by PCR POSITIVE (*)    All other components within normal limits  BASIC METABOLIC PANEL WITH GFR - Abnormal; Notable for the following components:   Glucose, Bld 214 (*)    All other components within normal limits  CBC - Abnormal; Notable for the following components:   Platelets 141 (*)    All other components within normal limits  GROUP A STREP BY PCR  CBG MONITORING, ED  CBG MONITORING, ED  TROPONIN I (HIGH SENSITIVITY)     MDM    Patient has positive for RSV.  Is otherwise well-appearing.  No wheezing on exam.  I have low suspicion for COPD exacerbation.  Troponin is negative with no concern for ACS at this time, no chest pain.  Patient was initially tachycardic on EKG however on my evaluation patient was not tachycardic and had a normal pulse.  100% on room air with speaking in full sentences.  Discussed symptomatic treatment for RSV.  Discussed follow-up closely with primary care and return to the emergency department for any worsening shortness of breath.  Patient states that he has albuterol at home.  Discussed  smoking cessation.   PROCEDURES:  Critical Care performed: No  Procedures  Patient's presentation is most consistent with acute complicated illness / injury requiring diagnostic workup.   MEDICATIONS ORDERED IN ED: Medications - No data to display  FINAL CLINICAL IMPRESSION(S) / ED DIAGNOSES   Final diagnoses:  RSV (acute bronchiolitis due to respiratory syncytial virus)     Rx / DC Orders   ED Discharge Orders          Ordered    benzonatate (TESSALON PERLES) 100 MG capsule  3 times daily PRN        08/11/23 1517             Note:  This document was prepared using Dragon voice recognition software and may include unintentional dictation errors.   Viviano Ground, MD 08/11/23 [phone]

## 2023-08-18 ENCOUNTER — Ambulatory Visit
Admission: RE | Admit: 2023-08-18 | Discharge: 2023-08-18 | Disposition: A | Discharge status: Home / Self Care | Source: Ambulatory Visit | Attending: Nurse Practitioner | Admitting: Nurse Practitioner

## 2023-08-18 DIAGNOSIS — R1909 Other intra-abdominal and pelvic swelling, mass and lump: Secondary | ICD-10-CM | POA: Insufficient documentation

## 2023-08-19 ENCOUNTER — Encounter: Payer: Self-pay | Admitting: Nurse Practitioner

## 2023-08-19 ENCOUNTER — Ambulatory Visit: Admitting: Nurse Practitioner

## 2023-08-19 DIAGNOSIS — Z113 Encounter for screening for infections with a predominantly sexual mode of transmission: Secondary | ICD-10-CM

## 2023-08-19 LAB — HM HIV SCREENING LAB: HM HIV Screening: NEGATIVE

## 2023-08-19 LAB — HEPATITIS B SURFACE ANTIGEN

## 2023-08-19 NOTE — Progress Notes (Signed)
 Pt is here for STD screening. Condoms given. Sonda Primes, RN.

## 2023-08-19 NOTE — Progress Notes (Signed)
 [facility] Department STI clinic [address] [place] [address] Main phone: [phone]  STI screening visit  Subjective:  Kyle [name] is a 63 y.o. male being seen today for an STI screening visit. The patient reports they do not have symptoms.    Patient has the following medical conditions:  Patient Active Problem List   Diagnosis Date Noted   Peyronie's disease 12/19/2019   Erectile dysfunction 12/19/2019   Ascending aorta dilatation (HCC) 09/06/2018   Diabetes mellitus type 2 with complications (HCC) 09/06/2018   Tobacco abuse 09/06/2018   DDD (degenerative disc disease), lumbar 01/10/2015   Facet syndrome, lumbar 01/10/2015   Lumbar radiculopathy 01/10/2015   DDD (degenerative disc disease), cervical 11/22/2014   Cervical facet syndrome 11/22/2014   Bilateral occipital neuralgia 11/22/2014   Hospital discharge follow-up 10/16/2014   Atherosclerosis of native coronary artery with stable angina pectoris (HCC)    S/P CABG x 4    Chest pain with moderate risk for cardiac etiology 09/27/2014   Cervicalgia 07/05/2014   Acute bacterial rhinosinusitis 06/15/2014   Non compliance w medication regimen 06/07/2014   Chest tightness 06/07/2014   Tobacco abuse counseling 01/05/2014   Vitamin D  deficiency 01/05/2014   Transaminitis 01/05/2014   Temporary low platelet count (HCC) 01/05/2014   Diabetes mellitus type 2, uncontrolled 01/05/2014   Back pain 10/01/2013   Need for prophylactic vaccination with tetanus-diphtheria (Td) 10/01/2013   Penile lesion 10/01/2013   Essential hypertension 08/29/2013   Hyperlipidemia with target LDL less than 70 08/29/2013   Callus of foot 08/29/2013   Blurred vision 08/29/2013   Itching 08/29/2013   Exacerbation of chronic back pain 06/07/2013   Hyperlipidemia 03/02/2013   Former smoker 03/02/2013   Routine general medical examination at a health care facility 02/24/2013   Hepatitis C 02/24/2013    Diabetes type 2, uncontrolled 02/24/2013    Chief Complaint  Patient presents with   SEXUALLY TRANSMITTED DISEASE   Patient is a pleasant 63 y.o. male who presents to the office today requesting asymptomatic STI testing. He reports 1 male partner in the last 2 months, and practices penile/vaginal penetrative sex. Patient reports using condoms sometimes. Patient indicates a history of an unknown STI, chart reviews indicates Chlamydia. He reports last sex was about 1 to 2 weeks ago.    STI screening history: Last HIV test per patient/review of record was No results found for: "HMHIVSCREEN" No results found for: "HIV"  Last HEPC test per patient/review of record was No results found for: "HMHEPCSCREEN" No components found for: "HEPC"   Last HEPB test per patient/review of record was No components found for: "HMHEPBSCREEN"   Fertility: Does the patient or their partner desires a pregnancy in the next year? No  Screening for MPX risk: Does the patient have an unexplained rash? No Is the patient MSM? No Does the patient endorse multiple sex partners or anonymous sex partners? No Did the patient have close or sexual contact with a person diagnosed with MPX? No Has the patient traveled outside the [place]  where MPX is endemic? No Is there a high clinical suspicion for MPX-- evidenced by one of the following No  -Unlikely to be chickenpox  -Lymphadenopathy  -Rash that present in same phase of evolution on any given body part   See flowsheet for further details and programmatic requirements.   Immunization History  Administered Date(s) Administered   Influenza,inj,Quad PF,6+ Mos 03/10/2013, 01/05/2014   Pneumococcal Polysaccharide-23 01/05/2014   Td  10/01/2013     The following portions of the patient's history were reviewed and updated as appropriate: allergies, current medications, past medical history, past social history, past surgical history and problem list.  Objective:  There  were no vitals filed for this visit.  Physical Exam Nursing note reviewed.  Constitutional:      Appearance: Normal appearance.  HENT:     Head: Normocephalic.     Salivary Glands: Right salivary gland is not diffusely enlarged or tender. Left salivary gland is not diffusely enlarged or tender.     Mouth/Throat:     Lips: Pink. No lesions.     Mouth: Mucous membranes are moist. No oral lesions.     Tongue: No lesions. Tongue does not deviate from midline.     Pharynx: Oropharynx is clear. Uvula midline. No oropharyngeal exudate or posterior oropharyngeal erythema.     Tonsils: No tonsillar exudate.  Eyes:     General:        Right eye: No discharge.        Left eye: No discharge.     Conjunctiva/sclera:     Right eye: Right conjunctiva is not injected. No exudate.    Left eye: Left conjunctiva is not injected. No exudate. Pulmonary:     Effort: Pulmonary effort is normal.  Genitourinary:    Comments: Patient asymptomatic and declines genital exam.  Lymphadenopathy:     Head:     Right side of head: No submental, submandibular, tonsillar, preauricular or posterior auricular adenopathy.     Left side of head: No submental, submandibular, tonsillar, preauricular or posterior auricular adenopathy.     Cervical: No cervical adenopathy.     Right cervical: No superficial or posterior cervical adenopathy.    Left cervical: No superficial or posterior cervical adenopathy.     Upper Body:     Right upper body: No supraclavicular or axillary adenopathy.     Left upper body: No supraclavicular or axillary adenopathy.  Skin:    General: Skin is warm and dry.     Findings: No lesion or rash.     Comments: Skin tone appropriate for ethnicity. Assessed exposed areas and back only.   Neurological:     Mental Status: He is alert and oriented to person, place, and time.  Psychiatric:        Attention and Perception: Attention and perception normal.        Mood and Affect: Mood and affect  normal.        Speech: Speech normal.        Behavior: Behavior normal. Behavior is cooperative.        Thought Content: Thought content normal.     Assessment and Plan:  Kyle [name] is a 63 y.o. male presenting to the [facility] Department for STI screening  1. Screening for venereal disease (Primary)  - HIV [facility] - HBV Antigen/Antibody [facility] - Syphilis Serology, [facility] - Chlamydia/GC NAA, Confirmation   Patient does not have STI symptoms Patient accepted all screenings including  urine GC/Chlamydia, and blood work for HIV/Syphilis. Patient meets criteria for HepB screening? Yes. Ordered? yes Patient meets criteria for HepC screening? Yes. Ordered? No-Known positive.  Recommended condom use with all sex Discussed importance of condom use for STI prevention  Treat positive test results per standing order. Discussed time line for State [facility] results and that patient will be called with positive results and encouraged patient to call if he  had not heard in 2 weeks Recommended repeat testing in 3 months with positive results. Recommended returning for continued or worsening symptoms.   Return if symptoms worsen or fail to improve.  No future appointments.  Total time with patient 15 minutes.   Merleen Stare, NP

## 2023-08-21 ENCOUNTER — Other Ambulatory Visit: Payer: Self-pay | Admitting: Nurse Practitioner

## 2023-08-21 DIAGNOSIS — K7460 Unspecified cirrhosis of liver: Secondary | ICD-10-CM

## 2023-08-21 LAB — CHLAMYDIA/GC NAA, CONFIRMATION
Chlamydia trachomatis, NAA: NEGATIVE
Neisseria gonorrhoeae, NAA: NEGATIVE

## 2023-08-28 ENCOUNTER — Ambulatory Visit: Attending: Nurse Practitioner

## 2023-09-01 ENCOUNTER — Emergency Department

## 2023-09-01 ENCOUNTER — Emergency Department: Admission: EM | Admit: 2023-09-01 | Discharge: 2023-09-01 | Disposition: A | Discharge status: Home / Self Care

## 2023-09-01 ENCOUNTER — Encounter: Payer: Self-pay | Admitting: Emergency Medicine

## 2023-09-01 ENCOUNTER — Other Ambulatory Visit: Payer: Self-pay

## 2023-09-01 DIAGNOSIS — Z760 Encounter for issue of repeat prescription: Secondary | ICD-10-CM | POA: Diagnosis not present

## 2023-09-01 DIAGNOSIS — Z951 Presence of aortocoronary bypass graft: Secondary | ICD-10-CM | POA: Diagnosis not present

## 2023-09-01 DIAGNOSIS — R42 Dizziness and giddiness: Secondary | ICD-10-CM | POA: Diagnosis present

## 2023-09-01 DIAGNOSIS — E119 Type 2 diabetes mellitus without complications: Secondary | ICD-10-CM | POA: Diagnosis not present

## 2023-09-01 DIAGNOSIS — J449 Chronic obstructive pulmonary disease, unspecified: Secondary | ICD-10-CM | POA: Diagnosis not present

## 2023-09-01 DIAGNOSIS — E871 Hypo-osmolality and hyponatremia: Secondary | ICD-10-CM | POA: Insufficient documentation

## 2023-09-01 DIAGNOSIS — R059 Cough, unspecified: Secondary | ICD-10-CM | POA: Diagnosis not present

## 2023-09-01 LAB — CBC WITH DIFFERENTIAL/PLATELET
Abs Immature Granulocytes: 0.02 10*3/uL (ref 0.00–0.07)
Basophils Absolute: 0 10*3/uL (ref 0.0–0.1)
Basophils Relative: 1 %
Eosinophils Absolute: 0.2 10*3/uL (ref 0.0–0.5)
Eosinophils Relative: 3 %
HCT: 50.4 % (ref 39.0–52.0)
Hemoglobin: 16.1 g/dL (ref 13.0–17.0)
Immature Granulocytes: 0 %
Lymphocytes Relative: 46 %
Lymphs Abs: 3.2 10*3/uL (ref 0.7–4.0)
MCH: 27.5 pg (ref 26.0–34.0)
MCHC: 31.9 g/dL (ref 30.0–36.0)
MCV: 86 fL (ref 80.0–100.0)
Monocytes Absolute: 0.7 10*3/uL (ref 0.1–1.0)
Monocytes Relative: 10 %
Neutro Abs: 2.8 10*3/uL (ref 1.7–7.7)
Neutrophils Relative %: 40 %
Platelets: 155 10*3/uL (ref 150–400)
RBC: 5.86 MIL/uL — ABNORMAL HIGH (ref 4.22–5.81)
RDW: 13 % (ref 11.5–15.5)
WBC: 6.9 10*3/uL (ref 4.0–10.5)
nRBC: 0 % (ref 0.0–0.2)

## 2023-09-01 LAB — RESP PANEL BY RT-PCR (RSV, FLU A&B, COVID)  RVPGX2
Influenza A by PCR: NEGATIVE
Influenza B by PCR: NEGATIVE
Resp Syncytial Virus by PCR: NEGATIVE
SARS Coronavirus 2 by RT PCR: NEGATIVE

## 2023-09-01 LAB — BASIC METABOLIC PANEL WITH GFR
Anion gap: 14 (ref 5–15)
BUN: 9 mg/dL (ref 8–23)
CO2: 23 mmol/L (ref 22–32)
Calcium: 9.7 mg/dL (ref 8.9–10.3)
Chloride: 97 mmol/L — ABNORMAL LOW (ref 98–111)
Creatinine, Ser: 0.62 mg/dL (ref 0.61–1.24)
GFR, Estimated: >60 mL/min (ref >60)
Glucose, Bld: 161 mg/dL — ABNORMAL HIGH (ref 70–99)
Potassium: 4 mmol/L (ref 3.5–5.1)
Sodium: 134 mmol/L — ABNORMAL LOW (ref 135–145)

## 2023-09-01 LAB — CBG MONITORING, ED: Glucose-Capillary: 196 mg/dL — ABNORMAL HIGH (ref 70–99)

## 2023-09-01 LAB — TROPONIN I (HIGH SENSITIVITY): Troponin I (High Sensitivity): 9 ng/L (ref <18)

## 2023-09-01 MED ORDER — SODIUM CHLORIDE 0.9 % IV BOLUS
500.0000 mL | Freq: Once | INTRAVENOUS | Status: AC
  Administered 2023-09-01: 500 mL via INTRAVENOUS

## 2023-09-01 MED ORDER — MECLIZINE HCL 12.5 MG PO TABS: 12.5000 mg | ORAL_TABLET | Freq: Three times a day (TID) | ORAL | 0 refills | Status: AC | PRN

## 2023-09-01 MED ORDER — MECLIZINE HCL 25 MG PO TABS
25.0000 mg | ORAL_TABLET | Freq: Once | ORAL | Status: AC
  Administered 2023-09-01: 25 mg via ORAL
  Filled 2023-09-01: qty 1

## 2023-09-01 NOTE — ED Provider Notes (Signed)
 [facility] Provider Note    Event Date/Time   First MD Initiated Contact with Patient 09/01/23 1549     (approximate)   History   Medication Refill  Pt reports being out of all diabetic medication x 3-4 days; reports reaching out to PCP without any return call.  Complaints of light headedness and nausea over last 2 days since being out of meds.  Ambulatory to triage, NAD noted at this time.     HPI Kyle [name] is a 63 y.o. male PMH medical comorbidities including diabetes, COPD, prior CABG, hyperlipidemia presents for evaluation of fatigue, lightheadedness, medication refill -Patient states he ran out of his diabetes medications 3-4 days ago, was unable to get a refill so came to emergency department for this.  Also notes that he has been feeling lightheaded and fatigued with a cough the past week or so.  No fevers.  No chest pain or shortness of breath.  No abdominal pain.  No focal weakness  Per chart review, last seen in our emergency department on 08/11/2023.  Noted cough at that time, found to have RSV.      Physical Exam   Triage Vital Signs: ED Triage Vitals  Encounter Vitals Group     BP 09/01/23 1322 (!) 131/102     Systolic BP Percentile --      Diastolic BP Percentile --      Pulse Rate 09/01/23 1322 (!) 102     Resp 09/01/23 1322 18     Temp 09/01/23 1322 98.7 F (37.1 C)     Temp Source 09/01/23 1322 Oral     SpO2 09/01/23 1322 99 %     Weight 09/01/23 1318 149 lb (67.6 kg)     Height 09/01/23 1318 5\' 8"  (1.727 m)     Head Circumference --      Peak Flow --      Pain Score 09/01/23 1318 0     Pain Loc --      Pain Education --      Exclude from Growth Chart --     Most recent vital signs: Vitals:   09/01/23 1322 09/01/23 1848  BP: (!) 131/102 (!) 191/102  Pulse: (!) 102 92  Resp: 18 17  Temp: 98.7 F (37.1 C) 98 F (36.7 C)  SpO2: 99% 98%     General: Awake, no distress.  CV:  Good peripheral perfusion. RRR, RP  2+ Resp:  Normal effort. CTAB Abd:  No distention. Nontender to deep palpation throughout Neuro:  Aox4, CN II-XII intact, FNF wnl, finger taps fast b/l, 5/5 strength in bilateral finger extension/grip, arm flexion/extension, EHL/FHL. BUE AG 10+ sec no drift, BLE AG 5+ sec no drift. Ambulates with steady gait. SILT. Negative Rhomberg.    ED Results / Procedures / Treatments   Labs (all labs ordered are listed, but only abnormal results are displayed) Labs Reviewed  CBC WITH DIFFERENTIAL/PLATELET - Abnormal; Notable for the following components:      Result Value   RBC 5.86 (*)    All other components within normal limits  BASIC METABOLIC PANEL WITH GFR - Abnormal; Notable for the following components:   Sodium 134 (*)    Chloride 97 (*)    Glucose, Bld 161 (*)    All other components within normal limits  CBG MONITORING, ED - Abnormal; Notable for the following components:   Glucose-Capillary 196 (*)    All other components within normal limits  RESP  PANEL BY RT-PCR (RSV, FLU A&B, COVID)  RVPGX2  TROPONIN I (HIGH SENSITIVITY)     EKG  See ED course below   RADIOLOGY See ED course below    PROCEDURES:  Critical Care performed: No  Procedures   MEDICATIONS ORDERED IN ED: Medications  sodium chloride  0.9 % bolus 500 mL (500 mLs Intravenous New Bag/Given 09/01/23 1704)  meclizine (ANTIVERT) tablet 25 mg (25 mg Oral Given 09/01/23 1704)     IMPRESSION / MDM / ASSESSMENT AND PLAN / ED COURSE  I reviewed the triage vital signs and the nursing notes.                              DDX/MDM/AP: Differential diagnosis includes, but is not limited to, DKA/HHS, consider underlying infection contributing to symptoms including postviral pneumonia, consider underlying electrolyte abnormality or anemia.  Doubt ACS.  With regard to lightheadedness, nonfocal neurologic exam here, do not suspect underlying CVA.  Plan: -Labs - EKG - Chest x-ray - Small bolus IV fluid,  meclizine  Patient's presentation is most consistent with acute presentation with potential threat to life or bodily function.  The patient is on the cardiac monitor to evaluate for evidence of arrhythmia and/or significant heart rate changes.  ED course below.  Cardiopulmonary workup unremarkable, symptoms proved with small bolus of IV fluid and meclizine.  While in the emergency department, patient was able to get in touch with his primary care clinic and confirmed that his medications are prescribed for pickup tomorrow.  No evidence of acute pathology today, plan for PMD follow-up.  ED return precautions in place.  Patient agrees with plan.  Rx meclizine for any recurrent lightheadedness, no concern for CVA on my eval.  Clinical Course as of 09/01/23 1924  Mon Sep 01, 2023  1633 Chest x-ray interpreted by myself, no acute pathology  Radiology read reviewed [MM]  1731 CBC reviewed, unremarkable [MM]  1738 Ecg = minus rhythm, rate 89, right bundle branch block present.  No gross evidence of ischemia on my read. [MM]  1816 Patient reevaluated, states he is feeling much better  No evidence of acute underlying pathology today.  Will refill blood sugar medications and plan for discharge home. [MM]  1824 Basic metabolic panel reviewed, unremarkable, mild hyponatremia, received IV fluid [MM]  1922 Viral swab negative  Patient reevaluated, continues to feel very well.  Now tells me he was able to get in touch with his primary care clinic and they have prescribed all of his regular medications which she will receive tomorrow.  Will proceed with discharge home. [MM]    Clinical Course User Index [MM] Collis Deaner, MD     FINAL CLINICAL IMPRESSION(S) / ED DIAGNOSES   Final diagnoses:  Medication refill  Lightheadedness     Rx / DC Orders   ED Discharge Orders          Ordered    meclizine (ANTIVERT) 12.5 MG tablet  3 times daily PRN        09/01/23 1923             Note:   This document was prepared using Dragon voice recognition software and may include unintentional dictation errors.   Collis Deaner, MD 09/01/23 [phone]

## 2023-09-01 NOTE — Discharge Instructions (Signed)
 Your evaluation in the emergency department was overall reassuring, your symptoms improved with treatment.  Please take your routine medications as prescribed by your primary care provider, and return to the emergency department with any new or worsening symptoms.  I did prescribe you a dizziness medication to use as needed for any recurrent symptoms.

## 2023-09-01 NOTE — ED Notes (Signed)
 Pt declined discharge vitals

## 2023-09-01 NOTE — ED Triage Notes (Addendum)
 Pt reports being out of all diabetic medication x 3-4 days; reports reaching out to PCP without any return call.  Complaints of light headedness and nausea over last 2 days since being out of meds.  Ambulatory to triage, NAD noted at this time.

## 2023-09-09 ENCOUNTER — Emergency Department
Admission: EM | Admit: 2023-09-09 | Discharge: 2023-09-09 | Disposition: A | Discharge status: Other Acute Inpatient Hospital | Attending: Emergency Medicine | Admitting: Emergency Medicine

## 2023-09-09 ENCOUNTER — Other Ambulatory Visit: Payer: Self-pay

## 2023-09-09 ENCOUNTER — Encounter: Payer: Self-pay | Admitting: Emergency Medicine

## 2023-09-09 ENCOUNTER — Inpatient Hospital Stay
Admission: AD | Admit: 2023-09-09 | Discharge: 2023-09-16 | DRG: 885 | Disposition: A | Discharge status: Home / Self Care | Source: Intra-hospital | Attending: Psychiatry | Admitting: Psychiatry

## 2023-09-09 ENCOUNTER — Encounter: Payer: Self-pay | Admitting: Psychiatry

## 2023-09-09 DIAGNOSIS — F29 Unspecified psychosis not due to a substance or known physiological condition: Secondary | ICD-10-CM | POA: Insufficient documentation

## 2023-09-09 DIAGNOSIS — Z79899 Other long term (current) drug therapy: Secondary | ICD-10-CM | POA: Insufficient documentation

## 2023-09-09 DIAGNOSIS — F329 Major depressive disorder, single episode, unspecified: Principal | ICD-10-CM | POA: Diagnosis present

## 2023-09-09 DIAGNOSIS — I10 Essential (primary) hypertension: Secondary | ICD-10-CM | POA: Diagnosis present

## 2023-09-09 DIAGNOSIS — F410 Panic disorder [episodic paroxysmal anxiety] without agoraphobia: Secondary | ICD-10-CM | POA: Diagnosis present

## 2023-09-09 DIAGNOSIS — Z888 Allergy status to other drugs, medicaments and biological substances status: Secondary | ICD-10-CM | POA: Diagnosis not present

## 2023-09-09 DIAGNOSIS — Z808 Family history of malignant neoplasm of other organs or systems: Secondary | ICD-10-CM | POA: Diagnosis not present

## 2023-09-09 DIAGNOSIS — F322 Major depressive disorder, single episode, severe without psychotic features: Secondary | ICD-10-CM | POA: Insufficient documentation

## 2023-09-09 DIAGNOSIS — F1010 Alcohol abuse, uncomplicated: Secondary | ICD-10-CM | POA: Diagnosis present

## 2023-09-09 DIAGNOSIS — Z951 Presence of aortocoronary bypass graft: Secondary | ICD-10-CM

## 2023-09-09 DIAGNOSIS — R45851 Suicidal ideations: Secondary | ICD-10-CM | POA: Diagnosis present

## 2023-09-09 DIAGNOSIS — F332 Major depressive disorder, recurrent severe without psychotic features: Secondary | ICD-10-CM

## 2023-09-09 DIAGNOSIS — Z833 Family history of diabetes mellitus: Secondary | ICD-10-CM

## 2023-09-09 DIAGNOSIS — K219 Gastro-esophageal reflux disease without esophagitis: Secondary | ICD-10-CM | POA: Diagnosis present

## 2023-09-09 DIAGNOSIS — J4489 Other specified chronic obstructive pulmonary disease: Secondary | ICD-10-CM | POA: Diagnosis present

## 2023-09-09 DIAGNOSIS — Z880 Allergy status to penicillin: Secondary | ICD-10-CM | POA: Diagnosis not present

## 2023-09-09 DIAGNOSIS — Z8249 Family history of ischemic heart disease and other diseases of the circulatory system: Secondary | ICD-10-CM | POA: Diagnosis not present

## 2023-09-09 DIAGNOSIS — E119 Type 2 diabetes mellitus without complications: Secondary | ICD-10-CM | POA: Diagnosis present

## 2023-09-09 DIAGNOSIS — R456 Violent behavior: Secondary | ICD-10-CM | POA: Diagnosis present

## 2023-09-09 DIAGNOSIS — F17210 Nicotine dependence, cigarettes, uncomplicated: Secondary | ICD-10-CM | POA: Diagnosis present

## 2023-09-09 DIAGNOSIS — I2510 Atherosclerotic heart disease of native coronary artery without angina pectoris: Secondary | ICD-10-CM | POA: Diagnosis present

## 2023-09-09 DIAGNOSIS — Z885 Allergy status to narcotic agent status: Secondary | ICD-10-CM | POA: Diagnosis not present

## 2023-09-09 DIAGNOSIS — E785 Hyperlipidemia, unspecified: Secondary | ICD-10-CM | POA: Diagnosis present

## 2023-09-09 DIAGNOSIS — Z881 Allergy status to other antibiotic agents status: Secondary | ICD-10-CM | POA: Diagnosis not present

## 2023-09-09 LAB — URINE DRUG SCREEN, QUALITATIVE (ARMC ONLY)
Amphetamines, Ur Screen: NOT DETECTED
Barbiturates, Ur Screen: NOT DETECTED
Benzodiazepine, Ur Scrn: NOT DETECTED
Cannabinoid 50 Ng, Ur ~~LOC~~: NOT DETECTED
Cocaine Metabolite,Ur ~~LOC~~: NOT DETECTED
MDMA (Ecstasy)Ur Screen: NOT DETECTED
Methadone Scn, Ur: NOT DETECTED
Opiate, Ur Screen: NOT DETECTED
Phencyclidine (PCP) Ur S: NOT DETECTED
Tricyclic, Ur Screen: NOT DETECTED

## 2023-09-09 LAB — COMPREHENSIVE METABOLIC PANEL WITH GFR
ALT: 78 U/L — ABNORMAL HIGH (ref 0–44)
AST: 58 U/L — ABNORMAL HIGH (ref 15–41)
Albumin: 3.6 g/dL (ref 3.5–5.0)
Alkaline Phosphatase: 53 U/L (ref 38–126)
Anion gap: 8 (ref 5–15)
BUN: 6 mg/dL — ABNORMAL LOW (ref 8–23)
CO2: 26 mmol/L (ref 22–32)
Calcium: 9.2 mg/dL (ref 8.9–10.3)
Chloride: 102 mmol/L (ref 98–111)
Creatinine, Ser: 0.64 mg/dL (ref 0.61–1.24)
GFR, Estimated: >60 mL/min (ref >60)
Glucose, Bld: 237 mg/dL — ABNORMAL HIGH (ref 70–99)
Potassium: 3.7 mmol/L (ref 3.5–5.1)
Sodium: 136 mmol/L (ref 135–145)
Total Bilirubin: 0.7 mg/dL (ref 0.0–1.2)
Total Protein: 7.3 g/dL (ref 6.5–8.1)

## 2023-09-09 LAB — SARS CORONAVIRUS 2 BY RT PCR: SARS Coronavirus 2 by RT PCR: NEGATIVE

## 2023-09-09 LAB — HEMOGLOBIN A1C
Hgb A1c MFr Bld: 9.7 % — ABNORMAL HIGH (ref 4.8–5.6)
Mean Plasma Glucose: 232 mg/dL

## 2023-09-09 LAB — CBG MONITORING, ED
Glucose-Capillary: 239 mg/dL — ABNORMAL HIGH (ref 70–99)
Glucose-Capillary: 242 mg/dL — ABNORMAL HIGH (ref 70–99)

## 2023-09-09 LAB — CBC
HCT: 44.8 % (ref 39.0–52.0)
Hemoglobin: 14.7 g/dL (ref 13.0–17.0)
MCH: 28.7 pg (ref 26.0–34.0)
MCHC: 32.8 g/dL (ref 30.0–36.0)
MCV: 87.5 fL (ref 80.0–100.0)
Platelets: 129 10*3/uL — ABNORMAL LOW (ref 150–400)
RBC: 5.12 MIL/uL (ref 4.22–5.81)
RDW: 13.3 % (ref 11.5–15.5)
WBC: 9.6 10*3/uL (ref 4.0–10.5)
nRBC: 0 % (ref 0.0–0.2)

## 2023-09-09 LAB — ETHANOL: Alcohol, Ethyl (B): 191 mg/dL — ABNORMAL HIGH (ref <15)

## 2023-09-09 MED ORDER — FOLIC ACID 1 MG PO TABS
1.0000 mg | ORAL_TABLET | Freq: Every day | ORAL | Status: DC
  Administered 2023-09-09: 1 mg via ORAL
  Filled 2023-09-09: qty 1

## 2023-09-09 MED ORDER — THIAMINE MONONITRATE 100 MG PO TABS
100.0000 mg | ORAL_TABLET | Freq: Every day | ORAL | Status: DC
  Administered 2023-09-09: 100 mg via ORAL
  Filled 2023-09-09: qty 1

## 2023-09-09 MED ORDER — AMLODIPINE BESYLATE 5 MG PO TABS
10.0000 mg | ORAL_TABLET | Freq: Every day | ORAL | Status: DC
  Administered 2023-09-09: 10 mg via ORAL
  Filled 2023-09-09: qty 2

## 2023-09-09 MED ORDER — ALBUTEROL SULFATE (2.5 MG/3ML) 0.083% IN NEBU: 2.5000 mg | INHALATION_SOLUTION | Freq: Four times a day (QID) | RESPIRATORY_TRACT | Status: DC | PRN

## 2023-09-09 MED ORDER — LORATADINE 10 MG PO TABS
10.0000 mg | ORAL_TABLET | Freq: Every day | ORAL | Status: DC
  Administered 2023-09-09: 10 mg via ORAL
  Filled 2023-09-09: qty 1

## 2023-09-09 MED ORDER — INSULIN ASPART 100 UNIT/ML IJ SOLN
0.0000 [IU] | Freq: Three times a day (TID) | INTRAMUSCULAR | Status: DC
  Administered 2023-09-09 (×2): 3 [IU] via SUBCUTANEOUS
  Filled 2023-09-09 (×2): qty 1

## 2023-09-09 MED ORDER — ALUM & MAG HYDROXIDE-SIMETH 200-200-20 MG/5ML PO SUSP: 30.0000 mL | ORAL | Status: DC | PRN

## 2023-09-09 MED ORDER — ACETAMINOPHEN 325 MG PO TABS
650.0000 mg | ORAL_TABLET | Freq: Four times a day (QID) | ORAL | Status: DC | PRN
  Administered 2023-09-10: 650 mg via ORAL
  Filled 2023-09-09: qty 2

## 2023-09-09 MED ORDER — ADULT MULTIVITAMIN W/MINERALS CH
1.0000 | ORAL_TABLET | Freq: Every day | ORAL | Status: DC
  Administered 2023-09-09: 1 via ORAL
  Filled 2023-09-09: qty 1

## 2023-09-09 MED ORDER — THIAMINE HCL 100 MG/ML IJ SOLN
100.0000 mg | Freq: Every day | INTRAMUSCULAR | Status: DC
  Filled 2023-09-09: qty 1

## 2023-09-09 MED ORDER — LORAZEPAM 1 MG PO TABS
1.0000 mg | ORAL_TABLET | ORAL | Status: DC | PRN
  Administered 2023-09-09 (×2): 1 mg via ORAL
  Filled 2023-09-09 (×2): qty 1

## 2023-09-09 MED ORDER — OLANZAPINE 5 MG PO TBDP: 5.0000 mg | ORAL_TABLET | Freq: Three times a day (TID) | ORAL | Status: DC | PRN

## 2023-09-09 MED ORDER — MAGNESIUM HYDROXIDE 400 MG/5ML PO SUSP: 30.0000 mL | Freq: Every day | ORAL | Status: DC | PRN

## 2023-09-09 MED ORDER — OLANZAPINE 10 MG IM SOLR: 5.0000 mg | Freq: Three times a day (TID) | INTRAMUSCULAR | Status: DC | PRN

## 2023-09-09 MED ORDER — ASPIRIN 81 MG PO TBEC
81.0000 mg | DELAYED_RELEASE_TABLET | Freq: Every day | ORAL | Status: DC
Start: 2023-09-09 — End: 2023-09-09
  Administered 2023-09-09: 81 mg via ORAL
  Filled 2023-09-09: qty 1

## 2023-09-09 NOTE — Progress Notes (Signed)
 Per [facility], pt has been accepted to: [facility] on 09/09/2023 with Bed Assignment: L35 pending EKG & Covid labs  Pt meets inpatient criteria per:  Gilman Lade, NP  Attending Physician will be: Belvie Boyers, MD  Report can be called to:  [phone]  Pt can arrive:  Pending lab results  Care Team Notified:  Gilman Lade, NP; Sharion Davidson, RN; Barclay Boop, RN; Lestine Rathke, RN    [place], [facility] [phone]

## 2023-09-09 NOTE — Discharge Instructions (Signed)

## 2023-09-09 NOTE — Consult Note (Signed)
 [facility] Psychiatric Consult Initial  Patient Name: .Kyle [name]  MRN: [id number]  DOB: 1961/03/06  Consult Order details:  Orders (From admission, onward)     Start     Ordered   09/09/23 0810  IP CONSULT TO PSYCHIATRY       Ordering Provider: Iver Marker, MD  Provider:  (Not yet assigned)  Question Answer Comment  Place call to: psych md   Reason for Consult Consult   Diagnosis/Clinical Info for Consult: ivc, high risk behaviors concerning for suicidal ideation      09/09/23 0809   09/09/23 0809  CONSULT TO CALL ACT TEAM       Ordering Provider: Iver Marker, MD  Provider:  (Not yet assigned)  Question:  Reason for Consult?  Answer:  Psych consult   09/09/23 0809             Mode of Visit: In person    Psychiatry Consult Evaluation  Service Date: Sep 09, 2023 LOS:  LOS: 0 days  Chief Complaint "Drinking"  Kyle [name] is a 63 year-old male who presented to the ED with suicidal ideations. He also reported problem of Alcohol abuse. Upon face-to-face evaluation, patient is restless, anxious and depressed. He reports that he has been drinking heavily for about 4 days, and was drinking up to 14 beers a day. His increased drinking was precipitated by increased depression related to "the lady I was dating left me". Patient had been with this lady for a while.  He reports that he noticed that his depression was becoming uncontrollable to the point of wanting to kill himself. He then called the police and was transported here. BAL 191. Patient presents with no hx of psychiatric treatment. Presents with hx of  suicide attempt, 20 years ago when he took a lot of pills due to stressful life. He reports no additional substance abuse. Patient reports he now lives alone. His brother checks on him sometimes. He denies hx of trauma or abuse. Reports hx of legal issues and has been in jail. No current charges. Presents with hx of DM and does not take medications on a regular basis. Patient  has no children.  Reports family is supportive "sometimes". Admits to feeling hopeless, worthless with  suicidal ideations. Per ED assessment, "He was placed under IVC and found to have a loaded pistol with alcohol use".   Primary Psychiatric Diagnoses  MDD 2.  Suicidal Ideations 3.  Alcohol abuse  Assessment  Kyle [name] is a 63 y.o. male admitted: Presented to the EDfor 09/09/2023  7:22 AM for increased depression and suicidal ideations, along with alcohol abuse. There are no documented previous  psychiatric diagnoses of   and has a past medical history of  DM2, HTN, DDD, CAD, etc..   His current presentation of increased anxiety, hopelessness, worthlessness and self harm thoughts  is most consistent with his current psychiatric/mental health concerns. He meets criteria for Inpatient services  based on the listed presenting symptms.  NO Current outpatient psychotropic medications  but patient is willing to start medications to address his depression and alcohol abuse. He reports he was not  compliant with  his regular. Please see plan below for detailed recommendations.   Diagnoses:  Active Hospital problems: Active Problems:   MDD (major depressive disorder), severe (HCC)   Alcohol abuse   Suicidal ideations    Plan   ## Psychiatric Medication Recommendations:  To be determined  ## Medical Decision Making Capacity: Not specifically  addressed in this encounter  ## Further Work-up:  -- EKG  -- Pertinent labwork reviewed earlier this admission includes: All labs reviews   ## Disposition:-- We recommend inpatient psychiatric hospitalization after medical hospitalization. Patient has been involuntarily committed on 09/09/2023.   ## Behavioral / Environmental: - No specific recommendations at this time.     ## Safety and Observation Level:  - Based on my clinical evaluation, I estimate the patient to be at high risk of self harm in the current setting. - At this time, we  recommend  routine. This decision is based on my review of the chart including patient's history and current presentation, interview of the patient, mental status examination, and consideration of suicide risk including evaluating suicidal ideation, plan, intent, suicidal or self-harm behaviors, risk factors, and protective factors. This judgment is based on our ability to directly address suicide risk, implement suicide prevention strategies, and develop a safety plan while the patient is in the clinical setting. Please contact our team if there is a concern that risk level has changed.  CSSR Risk Category:C-SSRS RISK CATEGORY: Low Risk  Suicide Risk Assessment: Patient has following modifiable risk factors for suicide: access to guns, active suicidal ideation, medication noncompliance, and lack of access to outpatient mental health resources, which we are addressing by admitting for treatment and recommending outpatient services upon discharge. Patient has following non-modifiable or demographic risk factors for suicide: male gender and history of suicide attempt Patient has the following protective factors against suicide: Supportive family  Thank you for this consult request. Recommendations have been communicated to the primary team.  We will recommend inpatient hospitalization at this time.   Elston Halsted, NP       History of Present Illness  Relevant Aspects of Hospital ED Course:  Admitted on 09/09/2023 for depression, suicidal ideations and alcohol abuse.    Patient Report:  "The lady I was dating left me"  Psych ROS:  Depression: reported Anxiety:  reported Mania (lifetime and current): NA Psychosis: (lifetime and current): NA  Collateral information:  Contacted: NA  ROS   Psychiatric and Social History  Psychiatric History:  Information collected from patient  Prev Dx/Sx: NA Current Psych Provider: NA Home Meds (current): NA Previous Med Trials: NA Therapy:  NA  Prior Psych Hospitalization: NA  Prior Self Harm: reported Prior Violence: NA  Family Psych History: NA Family Hx suicide: NA  Social History:  Developmental Hx: Na Educational Hx: NA Occupational Hx: Currently on disability Legal Hx: Jail. Currently no legal issues Living Situation: Lives alone Spiritual Hx: NA Access to weapons/lethal means: NA   Substance History Alcohol: Current use  Type of alcohol Beer Last Drink yesterday Number of drinks per day 14 History of alcohol withdrawal seizures NA History of DT's NA Tobacco: NA Illicit drugs: NA Prescription drug abuse: NA Rehab hx: NA  Exam Findings  Physical Exam:  Vital Signs:  Temp:  [97.6 F (36.4 C)] 97.6 F (36.4 C) (05/13 0709) Pulse Rate:  [94] 94 (05/13 0709) Resp:  [17] 17 (05/13 0709) BP: (153)/(99) 153/99 (05/13 0709) SpO2:  [100 %] 100 % (05/13 0709) Weight:  [68 kg] 68 kg (05/13 0710) Blood pressure (!) 153/99, pulse 94, temperature 97.6 F (36.4 C), temperature source Oral, resp. rate 17, height 5\' 8"  (1.727 m), weight 68 kg, SpO2 100%. Body mass index is 22.81 kg/m.  Physical Exam  Mental Status Exam: General Appearance: Disheveled  Orientation:  Full (Time, Place, and Person)  Memory:  Immediate;  Fair Recent;   Fair Remote;   Fair  Concentration:  Concentration: Fair and Attention Span: Fair  Recall:  Fair  Attention  Fair  Eye Contact:  Fair  Speech:  Pressured  Language:  Fair  Volume:  Decreased  Mood: Depressed, sad  Affect:  Depressed  Thought Process:  Coherent  Thought Content:  WDL  Suicidal Thoughts:  Yes.  with intent/plan  Homicidal Thoughts:  No  Judgement:  Fair  Insight:  Fair  Psychomotor Activity:  Decreased  Akathisia:  NA  Fund of Knowledge:  Fair      Assets:  Manufacturing systems engineer Desire for Improvement Social Support  Cognition:  WNL  ADL's:  Intact  AIMS (if indicated):   NA     Other History   These have been pulled in through the EMR,  reviewed, and updated if appropriate.  Family History:  The patient's family history includes Cancer in his sister; Diabetes in his sister and another family member; Heart disease in his father, mother, and another family member; Hypertension in his father.  Medical History: Past Medical History:  Diagnosis Date  . Allergy   . Asthma   . Chronic back pain 1981   a.followed by pain clinic and neurosurgery.  Aaron Aas COPD (chronic obstructive pulmonary disease) (HCC)   . Coronary artery disease involving native coronary artery    a. 10/2012 CABG x 4 @ [facility] (LIMA->LAD, VG->RI, VG->OM, VG->PDA);  b. 08/2014 Lexiscan  MV: low risk; c. 02/2019 MV: EF 53%, apical defect (atten vs scar), No ischemia-->low risk; d. 08/2022 MV: large area of inflat/inf ischemia and scar. Nl EF.  . DDD (degenerative disc disease)   . Diastolic dysfunction    a. 02/2019 Echo: EF 60-65%, mod LVH. Gr2 DD. Nl RV fxn. Unable to exclude bicuspid AoV; b. 04/2020 Echo: EF 50-55% anteroseptal HK, GrI DD, nl rV fxn, mild Ao sclerosis.  . Dilated aortic root (HCC)    a. 02/2019 Echo: Ao root 44mm, Asc Ao 40mm, Ao arch 27mm; b. 04/2020 Echo: Ao root 45mm. Asc Ao 39mm; c. 06/2020 CTA Chest: No Ao aneurysm or dissection.  Aaron Aas DM2 (diabetes mellitus, type 2) (HCC)   . Fainting spell   . GERD (gastroesophageal reflux disease)   . Hepatitis C    2/2 tattoo  . History of medication noncompliance   . Hyperlipidemia with target LDL less than 70   . Hypertension   . Polysubstance abuse (HCC)    a. ongoing daily tobacco abuse and etoh (on the weekends), remote crack/cocaine abuse last used approximately in 2006  . S/P CABG x 4    a. LIMA-LAD, SVG-Ramus, SVG-OM,SVG-dRCA  . Urine incontinence     Surgical History: Past Surgical History:  Procedure Laterality Date  . CARDIAC CATHETERIZATION  11/2012   [facility] - Multivessel CAD  . CORONARY ARTERY BYPASS GRAFT  8/14   CABG x 4; LIMA-LAD, SVG-RI, SVG-OM1, SVG-rPDA (Atretic  RIMA).  . neck injection       Medications:   Current Facility-Administered Medications:  .  albuterol  (PROVENTIL ) (2.5 MG/3ML) 0.083% nebulizer solution 2.5 mg, 2.5 mg, Inhalation, Q6H PRN, Iver Marker, MD .  amLODipine (NORVASC) tablet 10 mg, 10 mg, Oral, Daily, Quale, Mark, MD, 10 mg at 09/09/23 0955 .  aspirin  EC tablet 81 mg, 81 mg, Oral, Daily, Iver Marker, MD, 81 mg at 09/09/23 0955 .  folic acid (FOLVITE) tablet 1 mg, 1 mg, Oral, Daily, Quale, Mark, MD, 1 mg at 09/09/23 0955 .  insulin  aspart (novoLOG ) injection 0-9 Units, 0-9 Units, Subcutaneous, TID WC, Quale, Mark, MD .  loratadine (CLARITIN) tablet 10 mg, 10 mg, Oral, Daily, Quale, Mark, MD, 10 mg at 09/09/23 0955 .  LORazepam (ATIVAN) tablet 1-4 mg, 1-4 mg, Oral, Q1H PRN, Iver Marker, MD .  multivitamin with minerals tablet 1 tablet, 1 tablet, Oral, Daily, Iver Marker, MD, 1 tablet at 09/09/23 0955 .  thiamine (VITAMIN B1) tablet 100 mg, 100 mg, Oral, Daily, 100 mg at 09/09/23 0955 **OR** thiamine (VITAMIN B1) injection 100 mg, 100 mg, Intravenous, Daily, Iver Marker, MD  Current Outpatient Medications:  .  albuterol  (VENTOLIN  HFA) 108 (90 Base) MCG/ACT inhaler, Inhale 2 puffs into the lungs every 6 (six) hours as needed for wheezing or shortness of breath., Disp: 8 g, Rfl: 0 .  amLODipine (NORVASC) 10 MG tablet, Take 1 tablet by mouth daily., Disp: , Rfl:  .  ammonium lactate (LAC-HYDRIN) 12 % lotion, SMARTSIG:1 Sparingly Topical Twice Daily, Disp: , Rfl:  .  aspirin  EC 81 MG tablet, Take 1 tablet (81 mg total) by mouth daily., Disp: 90 tablet, Rfl: 3 .  atorvastatin  (LIPITOR) 40 MG tablet, Take 20 mg by mouth daily., Disp: , Rfl:  .  benzonatate  (TESSALON  PERLES) 100 MG capsule, Take 1 capsule (100 mg total) by mouth 3 (three) times daily as needed for cough., Disp: 30 capsule, Rfl: 0 .  busPIRone (BUSPAR) 7.5 MG tablet, Take 7.5 mg by mouth 2 (two) times daily. (Patient not taking: Reported on 12/17/2022), Disp: , Rfl:  .   Cetirizine  HCl 10 MG CAPS, Take 1 capsule (10 mg total) by mouth daily., Disp: 30 capsule, Rfl: 3 .  DULERA 100-5 MCG/ACT AERO, Inhale 2 puffs into the lungs 2 (two) times daily., Disp: , Rfl:  .  FLONASE ALLERGY RELIEF 50 MCG/ACT nasal spray, Place into both nostrils., Disp: , Rfl:  .  hydrocortisone 2.5 % ointment, Apply 1 a small amount to affected area twice a day as directed, Disp: , Rfl:  .  insulin  aspart (NOVOLOG ) 100 UNIT/ML FlexPen, Inject 28 Units into the skin with breakfast, with lunch, and with evening meal., Disp: , Rfl:  .  ipratropium-albuterol  (DUONEB) 0.5-2.5 (3) MG/3ML SOLN, Take 3 mLs by nebulization every 6 (six) hours as needed., Disp: 1080 mL, Rfl: 0 .  isosorbide  mononitrate (IMDUR ) 30 MG 24 hr tablet, Take 1 tablet (30 mg total) by mouth daily., Disp: 90 tablet, Rfl: 1 .  LANTUS SOLOSTAR 100 UNIT/ML Solostar Pen, SMARTSIG:34 Unit(s) SUB-Q Twice Daily, Disp: , Rfl:  .  lisinopril (ZESTRIL) 40 MG tablet, Take 1 tablet by mouth daily., Disp: , Rfl:  .  meclizine  (ANTIVERT ) 12.5 MG tablet, Take 1 tablet (12.5 mg total) by mouth 3 (three) times daily as needed for dizziness., Disp: 10 tablet, Rfl: 0 .  metoprolol  succinate (TOPROL -XL) 50 MG 24 hr tablet, Take 50 mg by mouth daily., Disp: , Rfl:  .  nitroGLYCERIN  (NITROSTAT ) 0.4 MG SL tablet, Place 1 tablet (0.4 mg total) under the tongue every 5 (five) minutes as needed for chest pain., Disp: 21 tablet, Rfl: 12 .  nortriptyline (PAMELOR) 75 MG capsule, Take 75 mg by mouth at bedtime., Disp: , Rfl:  .  omeprazole  (PRILOSEC) 20 MG capsule, Take 20 mg by mouth every morning., Disp: , Rfl:  .  ondansetron  (ZOFRAN -ODT) 4 MG disintegrating tablet, Take 1 tablet (4 mg total) by mouth every 8 (eight) hours as needed for nausea or vomiting., Disp: 20 tablet, Rfl: 0 .  pantoprazole  (PROTONIX ) 20 MG tablet, Take 1 tablet (20 mg total) by mouth daily for 14 days., Disp: 14 tablet, Rfl: 0 .  sildenafil (VIAGRA) 100 MG tablet, TAKE ONE TABLET  BY MOUTH AS DIRECTED FOR ERECTILE DYSFUNCTION 1 HOUR BEFORE SEXUAL ACTIVITY. DO NOT TAKE WITHIN 6 HOURS OF TERAZOSIN, PRAZOSIN, OR DOXAZOSIN. DO NOT TAKE MORE THAN 1 DOSE WITHIN 24 HOURS . DO NOT TAKE WITH NITRATES, Disp: , Rfl:  .  sucralfate  (CARAFATE ) 1 g tablet, Take 1 tablet (1 g total) by mouth 4 (four) times daily., Disp: 120 tablet, Rfl: 1 .  SYMBICORT 160-4.5 MCG/ACT inhaler, Inhale 2 puffs into the lungs 2 (two) times daily., Disp: , Rfl:  .  traZODone (DESYREL) 50 MG tablet, Take 50 mg by mouth at bedtime. (Patient not taking: Reported on 12/17/2022), Disp: , Rfl:  .  triamcinolone  ointment (KENALOG ) 0.5 %, Apply 1 a small amount to affected area twice a day as directed, Disp: , Rfl:   Allergies: Allergies  Allergen Reactions  . Amoxicillin Itching and Swelling  . Cyclobenzaprine  Itching and Rash    rash rash  . Gabapentin  Nausea And Vomiting  . Methadone Itching and Nausea And Vomiting    Reaction: Gi upset  . Naproxen  Hives  . Penicillin G Itching  . Propoxyphene Hives  . Tramadol Nausea And Vomiting, Hives and Rash  . Trazodone Nausea And Vomiting    Reaction: GI upset  Other Reaction(s): Tachycardia  . Trazodone And Nefazodone     Reaction: GI upset    Elston Halsted, NP

## 2023-09-09 NOTE — ED Provider Notes (Signed)
 The patient has been placed in psychiatric observation due to the need to provide a safe environment for the patient while obtaining psychiatric consultation and evaluation, as well as ongoing medical and medication management to treat the patient's condition.  The patient has been placed under full IVC at this time.    Iver Marker, MD 09/09/23 [phone]

## 2023-09-09 NOTE — ED Notes (Signed)
 Pt provided with dinner tray.

## 2023-09-09 NOTE — ED Notes (Addendum)
 Pt. Alert and oriented, warm and dry, in no distress. Pt. Denies SI, HI, and AVH. Patient aware of transfer to [facility] and agreeable with plan of care.  Report called to [facility].

## 2023-09-09 NOTE — Progress Notes (Signed)
 Patient ID: Kyle [name], male   DOB: 06/06/1960, 63 y.o.   MRN: [id number]  D: Pt here IVC from [facility] ED. Pt denies SI/HI/AVH and at this time. Pt rates pain 4/10 as a headache. Pt says he wants to stop drinking. States he has been binge drinking for last 5 days since his wife left him. Has consumed about 15-20 beers daily. Says his normal consumption is 1-2 beers, 2-3 days a week. Pt BAL=191 at admission. Denies any withdrawal symptoms. Denies history of withdrawal symptoms. Pt is alert and oriented. CIWA=2 upon admission to unit. Pt endorses anxiety 7/10 and depression 8/10. States he is not suicidal and has not been for the last 30 days. When asked about the gun that the police found in his possession pt states he didn't know what he was going to do with it. "I don't know. I called the police because I \\knew  I needed help. I was trying to get an appointment somewhere for tomorrow. I didn't think they would come out to the house and see the gun and all." Pt contracts for safety on unit. Says that gun is still at his residence. Pt educated on need for it to be secured by someone before discharge from facility. Pt denies drug use. States he smokes about 4-5 cigarettes daily but denies need for nicotine replacement. Denies history of abuse. Denies being prescribed any psychiatric meds. Says he takes insulin  for DM2.  Pt is pleasant and all questions answered.   A: Pt was offered support and encouragement. Pt is cooperative during assessment. VS assessed and admission paperwork signed. Belongings searched and contraband items placed in locker. Non-invasive skin search completed: skin is dry, flaky and intact. Pt introduced to unit milieu by nursing staff. Q 15 minute checks were started for safety.   R: Pt in room. Pt safety maintained on unit.

## 2023-09-09 NOTE — BH Assessment (Signed)
 Comprehensive Clinical Assessment (CCA) Note  09/09/2023 Kyle [name] [id number]  Chief Complaint:  Chief Complaint  Patient presents with   Psychiatric Evaluation   Visit Diagnosis: Major Depressive Disorder   Kyle [name] is a 63 year old male who presents to the ER due to having a plan to end his life. Patient contacted the help line due to having SI, he had a loaded gun near him when law enforcement arrived. Patient continues to voice SI. He admits to the use of alcohol. Upon arrival to the ER his BAC was 191.  CCA Screening, Triage and Referral (STR)  Patient Reported Information How did you hear about us ? Self  What Is the Reason for Your Visit/Call Today? Patient came to the ER due to planning to end his life.  How Long Has This Been Causing You Problems? <Week  What Do You Feel Would Help You the Most Today? Treatment for Depression or other mood problem; Alcohol or Drug Use Treatment   Have You Recently Had Any Thoughts About Hurting Yourself? Yes  Are You Planning to Commit Suicide/Harm Yourself At This time? Yes   Flowsheet Row ED from 09/09/2023 in [facility] Emergency Department at [facility] ED from 09/01/2023 in [facility] Emergency Department at [facility] ED from 08/11/2023 in [facility] Emergency Department at [facility]  C-SSRS RISK CATEGORY High Risk No Risk No Risk       Have you Recently Had Thoughts About Hurting Someone Kyle [name]? No  Are You Planning to Harm Someone at This Time? No  Explanation: No data recorded  Have You Used Any Alcohol or Drugs in the Past 24 Hours? Yes  How Long Ago Did You Use Drugs or Alcohol? Alcohol  What Did You Use and How Much? Unable to quantify   Do You Currently Have a Therapist/Psychiatrist? No  Name of Therapist/Psychiatrist:    Have You Been Recently Discharged From Any Office Practice or Programs? No  Explanation of Discharge From Practice/Program: No data recorded    CCA  Screening Triage Referral Assessment Type of Contact: Face-to-Face  Telemedicine Service Delivery:   Is this Initial or Reassessment?   Date Telepsych consult ordered in [facility]:    Time Telepsych consult ordered in [facility]:    Location of Assessment: [facility] ED  Provider Location: [facility] ED   Collateral Involvement: No data recorded  Does Patient Have a Court Appointed Legal Guardian? No data recorded Legal Guardian Contact Information: No data recorded Copy of Legal Guardianship Form: No data recorded Legal Guardian Notified of Arrival: No data recorded Legal Guardian Notified of Pending Discharge: No data recorded If Minor and Not Living with Parent(s), Who has Custody? No data recorded Is CPS involved or ever been involved? Never  Is APS involved or ever been involved? Never   Patient Determined To Be At Risk for Harm To Self or Others Based on Review of Patient Reported Information or Presenting Complaint? Yes, for Self-Harm  Method: No data recorded Availability of Means: No data recorded Intent: No data recorded Notification Required: No data recorded Additional Information for Danger to Others Potential: No data recorded Additional Comments for Danger to Others Potential: No data recorded Are There Guns or Other Weapons in Your Home? No data recorded Types of Guns/Weapons: No data recorded Are These Weapons Safely Secured?                            No data recorded Who  Could Verify You Are Able To Have These Secured: No data recorded Do You Have any Outstanding Charges, Pending Court Dates, Parole/Probation? No data recorded Contacted To Inform of Risk of Harm To Self or Others: No data recorded   Does Patient Present under Involuntary Commitment? Yes    [place] of Residence: [place]   Patient Currently Receiving the Following Services: Not Receiving Services   Determination of Need: Emergent (2 hours)   Options For Referral: Inpatient Hospitalization; ED  Visit   CCA Biopsychosocial Patient Reported Schizophrenia/Schizoaffective Diagnosis in Past: No   Strengths: Seeking help, have some insight and have stable housing.   Mental Health Symptoms Depression:  Change in energy/activity; Difficulty Concentrating; Tearfulness; Worthlessness   Duration of Depressive symptoms: Duration of Depressive Symptoms: Greater than two weeks   Mania:  None   Anxiety:   N/A   Psychosis:  None   Duration of Psychotic symptoms:    Trauma:  N/A   Obsessions:  N/A   Compulsions:  N/A   Inattention:  N/A   Hyperactivity/Impulsivity:  N/A   Oppositional/Defiant Behaviors:  N/A   Emotional Irregularity:  N/A   Other Mood/Personality Symptoms:  No data recorded   Mental Status Exam Appearance and self-care  Stature:  Average   Weight:  Average weight   Clothing:  No data recorded  Grooming:  Normal   Cosmetic use:  None   Posture/gait:  Normal   Motor activity:  No data recorded  Sensorium  Attention:  Normal   Concentration:  Normal   Orientation:  X5   Recall/memory:  Normal   Affect and Mood  Affect:  Appropriate   Mood:  Depressed   Relating  Eye contact:  None   Facial expression:  Depressed; Responsive; Sad   Attitude toward examiner:  Cooperative   Thought and Language  Speech flow: Clear and Coherent   Thought content:  Appropriate to Mood and Circumstances   Preoccupation:  None   Hallucinations:  None   Organization:  Intact   [employer] of Knowledge:  Fair   Intelligence:  Average   Abstraction:  Functional   Judgement:  Impaired   Reality Testing:  Adequate   Insight:  Fair   Decision Making:  Normal   Social Functioning  Social Maturity:  Isolates   Social Judgement:  Normal   Stress  Stressors:  Relationship; Transitions   Coping Ability:  Exhausted; Deficient supports   Skill Deficits:  None   Supports:  Support needed      Religion: Religion/Spirituality Are You A Religious Person?: No  Leisure/Recreation: Leisure / Recreation Do You Have Hobbies?: No  Exercise/Diet: Exercise/Diet Do You Exercise?: No Have You Gained or Lost A Significant Amount of Weight in the Past Six Months?: No Do You Follow a Special Diet?: No Do You Have Any Trouble Sleeping?: No   CCA Employment/Education Employment/Work Situation: Employment / Work Situation Has Patient ever Been in Equities trader?: Yes (Describe in comment)  Education: Education Is Patient Currently Attending School?: No Did You Have An Individualized Education Program (IIEP): No Did You Have Any Difficulty At [employer]?: No Patient's Education Has Been Impacted by Current Illness: No   CCA Family/Childhood History Family and Relationship History: Family history Marital status: Single  Childhood History:  Childhood History Did patient suffer any verbal/emotional/physical/sexual abuse as a child?: No Did patient suffer from severe childhood neglect?: No Has patient ever been sexually abused/assaulted/raped as an adolescent or adult?: No Was the patient  ever a victim of a crime or a disaster?: No Witnessed domestic violence?: No Has patient been affected by domestic violence as an adult?: No  CCA Substance Use Alcohol/Drug Use: Alcohol / Drug Use Pain Medications: See MAR Prescriptions: See MAR Over the Counter: See MAR History of alcohol / drug use?: Yes Longest period of sobriety (when/how long): Unable to quantify Substance #1 Name of Substance 1: Alcohol 1 - Last Use / Amount: 09/09/2023 1- Route of Use: Oral   ASAM's:  Six Dimensions of Multidimensional Assessment  Dimension 1:  Acute Intoxication and/or Withdrawal Potential:      Dimension 2:  Biomedical Conditions and Complications:      Dimension 3:  Emotional, Behavioral, or Cognitive Conditions and Complications:     Dimension 4:  Readiness to Change:     Dimension 5:   Relapse, Continued use, or Continued Problem Potential:     Dimension 6:  Recovery/Living Environment:     ASAM Severity Score:    ASAM Recommended Level of Treatment:     Substance use Disorder (SUD)    Recommendations for Services/Supports/Treatments:    Disposition Recommendation per psychiatric provider: We recommend inpatient psychiatric hospitalization after medical hospitalization. Patient has been involuntarily committed on 09/09/2023.    DSM5 Diagnoses: Patient Active Problem List   Diagnosis Date Noted   MDD (major depressive disorder), severe (HCC) 09/09/2023   Alcohol abuse 09/09/2023   Suicidal ideations 09/09/2023   Peyronie's disease 12/19/2019   Erectile dysfunction 12/19/2019   Ascending aorta dilatation (HCC) 09/06/2018   Diabetes mellitus type 2 with complications (HCC) 09/06/2018   Tobacco abuse 09/06/2018   DDD (degenerative disc disease), lumbar 01/10/2015   Facet syndrome, lumbar 01/10/2015   Lumbar radiculopathy 01/10/2015   DDD (degenerative disc disease), cervical 11/22/2014   Cervical facet syndrome 11/22/2014   Bilateral occipital neuralgia 11/22/2014   Hospital discharge follow-up 10/16/2014   Atherosclerosis of native coronary artery with stable angina pectoris (HCC)    S/P CABG x 4    Chest pain with moderate risk for cardiac etiology 09/27/2014   Cervicalgia 07/05/2014   Acute bacterial rhinosinusitis 06/15/2014   Non compliance w medication regimen 06/07/2014   Chest tightness 06/07/2014   Tobacco abuse counseling 01/05/2014   Vitamin D  deficiency 01/05/2014   Transaminitis 01/05/2014   Temporary low platelet count (HCC) 01/05/2014   Diabetes mellitus type 2, uncontrolled 01/05/2014   Back pain 10/01/2013   Need for prophylactic vaccination with tetanus-diphtheria (Td) 10/01/2013   Penile lesion 10/01/2013   Essential hypertension 08/29/2013   Hyperlipidemia with target LDL less than 70 08/29/2013   Callus of foot 08/29/2013   Blurred  vision 08/29/2013   Itching 08/29/2013   Exacerbation of chronic back pain 06/07/2013   Hyperlipidemia 03/02/2013   Former smoker 03/02/2013   Routine general medical examination at a health care facility 02/24/2013   Hepatitis C 02/24/2013   Diabetes type 2, uncontrolled 02/24/2013    Referrals to Alternative Service(s): Referred to Alternative Service(s):   Place:   Date:   Time:    Referred to Alternative Service(s):   Place:   Date:   Time:    Referred to Alternative Service(s):   Place:   Date:   Time:    Referred to Alternative Service(s):   Place:   Date:   Time:     Bryce Captain MS, LCAS, [facility], [facility] Therapeutic Triage Specialist 09/09/2023 12:19 PM

## 2023-09-09 NOTE — Tx Team (Signed)
 Initial Treatment Plan 09/09/2023 11:41 PM Kyle [name] WGN:[id number]    PATIENT STRESSORS: Substance abuse   Other: wife left him     PATIENT STRENGTHS: Average or above average intelligence  Capable of independent living  Communication skills    PATIENT IDENTIFIED PROBLEMS: Suicidal ideations  Depression  Alcohol use d/o  Anxiety   ("Pt wants to stop drinking and get therapy")             DISCHARGE CRITERIA:  Improved stabilization in mood, thinking, and/or behavior Motivation to continue treatment in a less acute level of care  PRELIMINARY DISCHARGE PLAN: Outpatient therapy Return to previous living arrangement Return to previous work or school arrangements  PATIENT/FAMILY INVOLVEMENT: This treatment plan has been presented to and reviewed with the patient, Kyle [name], and/or family member.  The patient and family have been given the opportunity to ask questions and make suggestions.  Margaret Sharp, RN 09/09/2023, 11:41 PM

## 2023-09-09 NOTE — ED Notes (Addendum)
 Patient belongings:   Camilo Cella t shirt 2 black shoes  2 white socks 1 black jacket 1 black/white printed shorts 1 cell phone 1 wallet 1 set of keys

## 2023-09-09 NOTE — ED Notes (Signed)
IVC  PENDING  CONSULT   MOVED  TO  BHU

## 2023-09-09 NOTE — Inpatient Diabetes Management (Signed)
 Inpatient Diabetes Program Recommendations  AACE/ADA: New Consensus Statement on Inpatient Glycemic Control   Target Ranges:  Prepandial:   less than 140 mg/dL      Peak postprandial:   less than 180 mg/dL (1-2 hours)      Critically ill patients:  140 - 180 mg/dL    Latest Reference Range & Units 09/01/23 17:03 09/09/23 07:14  Glucose 70 - 99 mg/dL 841 (H) 324 (H)   Review of Glycemic Control  Diabetes history: DM2 Outpatient Diabetes medications: Lantus 34 units BID, Novolog  28 units TID with meals Current orders for Inpatient glycemic control: Novolog  0-9 units TID with meals  Inpatient Diabetes Program Recommendations:    Insulin : Please consider increasing Novolog  to 0-15 units TID and adding Novolog  0-5 units at bedtime.  If CBGs are consistently over 180 mg/dl with Novolog  correction, will need to add basal insulin .  NOTE: Patient in ED with SI. Per chart review, patient was in ED on 09/01/23 due to being out of DM medications for 4 days. Per ED note by Dr. Cam Cava on 09/01/23, "While in the emergency department, patient was able to get in touch with his primary care clinic and confirmed that his medications are prescribed for pickup tomorrow." Current CBG 237 mg/dl at 4:01 am today. Recommend to increase Novolog  correction scale and add bedtime correction.  If CBGs are consistently over 180 mg/dl with Novolog  correction, will need to add basal insulin .   Thanks, Beacher Limerick, RN, MSN, CDCES Diabetes Coordinator Inpatient Diabetes Program [phone] (Team Pager from 8am to 5pm)

## 2023-09-09 NOTE — ED Provider Notes (Signed)
 [facility] Provider Note    Event Date/Time   First MD Initiated Contact with Patient 09/09/23 [phone]     (approximate)   History   Psychiatric Evaluation   HPI  Kyle [name] is a 63 y.o. male history of diabetes  Patient called veterans support line, he has been using alcohol.  There was concern for suicidal ideation.  He was placed under IVC and found to have a loaded pistol with alcohol use.  Patient reports that he is having mental crisis.  He has been drinking somewhat heavily for the last 4 to 5 days.  Suicidal ideation  No recent medical issues except he is regularly treated for his diabetes   No reported overdose or ingestion.  Patient called voluntarily to a veterans assistance line   Physical Exam   Triage Vital Signs: ED Triage Vitals  Encounter Vitals Group     BP 09/09/23 0709 (!) 153/99     Systolic BP Percentile --      Diastolic BP Percentile --      Pulse Rate 09/09/23 0709 94     Resp 09/09/23 0709 17     Temp 09/09/23 0709 97.6 F (36.4 C)     Temp Source 09/09/23 0709 Oral     SpO2 09/09/23 0709 100 %     Weight 09/09/23 0710 150 lb (68 kg)     Height 09/09/23 0710 5\' 8"  (1.727 m)     Head Circumference --      Peak Flow --      Pain Score 09/09/23 0710 0     Pain Loc --      Pain Education --      Exclude from Growth Chart --     Most recent vital signs: Vitals:   09/09/23 0709  BP: (!) 153/99  Pulse: 94  Resp: 17  Temp: 97.6 F (36.4 C)  SpO2: 100%     General: Awake, no distress.  Pleasant.  Speech is slightly slurred.  Alert opens eyes to voice interacts with examiner well. CV:  Good peripheral perfusion.  Resp:  Normal effort.  Abd:  No distention.  Other:  Moves all extremities without obvious deficit.  Maintains good eye contact.  Normal extraocular movements.  Gives succinct history.  Reports feeling that he is in crisis  He does report a history of occasional alcohol use from time to time but  does not ever have withdrawals.  Reports he usually drink for a few days at 1 time   ED Results / Procedures / Treatments   Labs (all labs ordered are listed, but only abnormal results are displayed) Labs Reviewed  COMPREHENSIVE METABOLIC PANEL WITH GFR - Abnormal; Notable for the following components:      Result Value   Glucose, Bld 237 (*)    BUN 6 (*)    AST 58 (*)    ALT 78 (*)    All other components within normal limits  ETHANOL - Abnormal; Notable for the following components:   Alcohol, Ethyl (B) 191 (*)    All other components within normal limits  CBC - Abnormal; Notable for the following components:   Platelets 129 (*)    All other components within normal limits  CBG MONITORING, ED - Abnormal; Notable for the following components:   Glucose-Capillary 239 (*)    All other components within normal limits  SARS CORONAVIRUS 2 (TAT 6-24 HRS)  URINE DRUG SCREEN, QUALITATIVE ([facility] ONLY)  HEMOGLOBIN  A1C   Medical screening labs interpreted by me is reassuring.  Very mild transaminitis but this is without acute abdominal symptoms in a patient with known history of hepatitis and alcohol use.  Suspect chronic.  CBC normal.  EKG     RADIOLOGY     PROCEDURES:  Critical Care performed: No  Procedures   MEDICATIONS ORDERED IN ED: Medications  LORazepam (ATIVAN) tablet 1-4 mg (has no administration in time range)  thiamine (VITAMIN B1) tablet 100 mg (100 mg Oral Given 09/09/23 0955)    Or  thiamine (VITAMIN B1) injection 100 mg ( Intravenous See Alternative 09/09/23 0955)  folic acid (FOLVITE) tablet 1 mg (1 mg Oral Given 09/09/23 0955)  multivitamin with minerals tablet 1 tablet (1 tablet Oral Given 09/09/23 0955)  insulin  aspart (novoLOG ) injection 0-9 Units (3 Units Subcutaneous Given 09/09/23 1155)  albuterol  (PROVENTIL ) (2.5 MG/3ML) 0.083% nebulizer solution 2.5 mg (has no administration in time range)  amLODipine (NORVASC) tablet 10 mg (10 mg Oral Given 09/09/23  0955)  aspirin  EC tablet 81 mg (81 mg Oral Given 09/09/23 0955)  loratadine (CLARITIN) tablet 10 mg (10 mg Oral Given 09/09/23 0955)     IMPRESSION / MDM / ASSESSMENT AND PLAN / ED COURSE  I reviewed the triage vital signs and the nursing notes.                              Differential diagnosis includes, but is not limited to, mental health crisis, suicidal ideation, alcohol abuse, etc.  Some of the medications in the patient's reconciliation are quite remote in their history.  He does take antihypertensive and uses insulin .  Will continue him on these medications.  I have consulted our diabetes coordinator for additional advice and monitoring of his diabetes care and treatment while boarding in the emergency department  Patient's presentation is most consistent with acute complicated illness / injury requiring diagnostic workup.  ----------------------------------------- 8:18 AM on 09/09/2023 ----------------------------------------- No evidence of acute medical causation for the patient's symptoms.  Based on his clinical history clinical examination at this time I suspect alcohol use, life stressors, depressive type symptoms driving a concern for suicidal ideation.  He is under involuntary commitment and will remain under so at this time until further planning by psychiatry.  Patient very much agreeable with obtaining psychiatric consult.  Diabetes coordinator consulted   ECG interpreted by me at 1:20 PM heart rate 95 QRS 140 QTc 500 Bifascicular block.  As compared to previous EKG appears similar in morphology  Currently planning for inpatient psychiatric hospitalization, under IVC.     FINAL CLINICAL IMPRESSION(S) / ED DIAGNOSES   Final diagnoses:  Suicidal ideations     Rx / DC Orders   ED Discharge Orders     None        Note:  This document was prepared using Dragon voice recognition software and may include unintentional dictation errors.   Iver Marker,  MD 09/09/23 1332

## 2023-09-09 NOTE — TOC CM/SW Note (Addendum)
 Substance use resources added to AVS.  Patient is being followed by [facility].  Please re-consult TOC if needs arise.

## 2023-09-09 NOTE — ED Notes (Signed)
 Transferred to [facility] BHU via wheelchair by EDT and security. 1 bag of belongings sent to be locked in [place] locker room. Pt alert and oriented X4, cooperative, RR even and unlabored, color WNL. Pt in NAD.

## 2023-09-09 NOTE — Group Note (Signed)
 Date:  09/09/2023 Time:  11:16 PM  Group Topic/Focus:  Wrap-Up Group:   The focus of this group is to help patients review their daily goal of treatment and discuss progress on daily workbooks.    Participation Level:  Did Not Attend  Participation Quality:     Affect:     Cognitive:     Insight: None  Engagement in Group:  None  Modes of Intervention:     Additional Comments:    Rolland Cline 09/09/2023, 11:16 PM

## 2023-09-09 NOTE — Progress Notes (Signed)
   09/09/23 2245  Psych Admission Type (Psych Patients Only)  Admission Status Involuntary  Psychosocial Assessment  Patient Complaints Anxiety;Depression;Substance abuse (alcohol abuse)  Eye Contact Brief  Facial Expression Flat  Affect Depressed  Speech Soft;Slow  Interaction Assertive  Motor Activity Slow;Unsteady  Appearance/Hygiene In scrubs  Behavior Characteristics Cooperative;Anxious  Mood Anxious;Depressed  Thought Process  Coherency WDL  Content WDL  Delusions None reported or observed  Perception WDL  Hallucination None reported or observed  Judgment Impaired  Confusion None  Danger to Self  Current suicidal ideation? Denies  Agreement Not to Harm Self Yes  Description of Agreement verbal  Danger to Others  Danger to Others None reported or observed

## 2023-09-09 NOTE — ED Notes (Signed)
 EKG obtained w/o incident

## 2023-09-09 NOTE — ED Triage Notes (Signed)
 Patient to ED via PD IVC'd for psych evaluation. PT called the Veteran's help hotline stating he wanted to kill himself. PD found a gun at pt's bedside. PT reports drinking alcohol for the past 4 days. PT reports that his wife recently left him. Pt answered all SI questions "no" during triage. PT calm and cooperative in triage.

## 2023-09-09 NOTE — ED Notes (Signed)
 Pt. Transferred to BHU , room# 3 from main ED .Patient was screened by security before entering the unit. Received Report and Recommendations from [place], [place] . Pt. Oriented to unit including Q15 minute rounding as well as locked bathroom protocol, meal / snack schedule, and  the security cameras in place for their protection. Patient is A/O x 4, warm / dry.  Showing no acute signs of distress. Staff to monitor as ordered

## 2023-09-10 DIAGNOSIS — F332 Major depressive disorder, recurrent severe without psychotic features: Secondary | ICD-10-CM

## 2023-09-10 LAB — GLUCOSE, CAPILLARY
Glucose-Capillary: 179 mg/dL — ABNORMAL HIGH (ref 70–99)
Glucose-Capillary: 137 mg/dL — ABNORMAL HIGH (ref 70–99)
Glucose-Capillary: 54 mg/dL — ABNORMAL LOW (ref 70–99)
Glucose-Capillary: 112 mg/dL — ABNORMAL HIGH (ref 70–99)
Glucose-Capillary: 185 mg/dL — ABNORMAL HIGH (ref 70–99)
Glucose-Capillary: 224 mg/dL — ABNORMAL HIGH (ref 70–99)

## 2023-09-10 LAB — SARS CORONAVIRUS 2 (TAT 6-24 HRS): SARS Coronavirus 2: NEGATIVE

## 2023-09-10 MED ORDER — GLUCERNA SHAKE PO LIQD
237.0000 mL | Freq: Three times a day (TID) | ORAL | Status: DC
Start: 2023-09-10 — End: 2023-09-16
  Administered 2023-09-10 – 2023-09-16 (×18): 237 mL via ORAL

## 2023-09-10 MED ORDER — ADULT MULTIVITAMIN W/MINERALS CH
1.0000 | ORAL_TABLET | Freq: Every day | ORAL | Status: DC
  Administered 2023-09-10 – 2023-09-16 (×7): 1 via ORAL
  Filled 2023-09-10 (×7): qty 1

## 2023-09-10 MED ORDER — SERTRALINE HCL 25 MG PO TABS
25.0000 mg | ORAL_TABLET | Freq: Every day | ORAL | Status: DC
  Administered 2023-09-10 – 2023-09-12 (×3): 25 mg via ORAL
  Filled 2023-09-10 (×3): qty 1

## 2023-09-10 MED ORDER — CHLORDIAZEPOXIDE HCL 25 MG PO CAPS
25.0000 mg | ORAL_CAPSULE | Freq: Three times a day (TID) | ORAL | Status: AC
  Administered 2023-09-11 (×3): 25 mg via ORAL
  Filled 2023-09-10 (×3): qty 1

## 2023-09-10 MED ORDER — CHLORDIAZEPOXIDE HCL 25 MG PO CAPS
25.0000 mg | ORAL_CAPSULE | Freq: Four times a day (QID) | ORAL | Status: AC
  Administered 2023-09-10 (×4): 25 mg via ORAL
  Filled 2023-09-10 (×4): qty 1

## 2023-09-10 MED ORDER — METOPROLOL TARTRATE 25 MG PO TABS
37.5000 mg | ORAL_TABLET | Freq: Two times a day (BID) | ORAL | Status: DC
  Administered 2023-09-10 – 2023-09-16 (×12): 37.5 mg via ORAL
  Filled 2023-09-10 (×12): qty 2

## 2023-09-10 MED ORDER — CLONIDINE HCL 0.1 MG PO TABS
0.1000 mg | ORAL_TABLET | Freq: Two times a day (BID) | ORAL | Status: DC
  Administered 2023-09-10 – 2023-09-15 (×11): 0.1 mg via ORAL
  Filled 2023-09-10 (×12): qty 1

## 2023-09-10 MED ORDER — INSULIN GLARGINE-YFGN 100 UNIT/ML ~~LOC~~ SOLN
15.0000 [IU] | Freq: Every day | SUBCUTANEOUS | Status: DC
  Administered 2023-09-11 – 2023-09-14 (×4): 15 [IU] via SUBCUTANEOUS
  Filled 2023-09-10 (×5): qty 0.15

## 2023-09-10 MED ORDER — ADULT MULTIVITAMIN W/MINERALS CH: 1.0000 | ORAL_TABLET | Freq: Every day | ORAL | Status: DC

## 2023-09-10 MED ORDER — LOPERAMIDE HCL 2 MG PO CAPS: 2.0000 mg | ORAL_CAPSULE | ORAL | Status: AC | PRN

## 2023-09-10 MED ORDER — INSULIN ASPART 100 UNIT/ML IJ SOLN
0.0000 [IU] | Freq: Three times a day (TID) | INTRAMUSCULAR | Status: DC
  Administered 2023-09-10 – 2023-09-11 (×4): 3 [IU] via SUBCUTANEOUS
  Administered 2023-09-12: 5 [IU] via SUBCUTANEOUS
  Administered 2023-09-12: 3 [IU] via SUBCUTANEOUS
  Administered 2023-09-12: 5 [IU] via SUBCUTANEOUS
  Administered 2023-09-13: 3 [IU] via SUBCUTANEOUS
  Administered 2023-09-13 – 2023-09-14 (×3): 5 [IU] via SUBCUTANEOUS
  Administered 2023-09-14: 2 [IU] via SUBCUTANEOUS
  Administered 2023-09-14: 5 [IU] via SUBCUTANEOUS
  Administered 2023-09-15 (×2): 8 [IU] via SUBCUTANEOUS
  Administered 2023-09-15: 3 [IU] via SUBCUTANEOUS
  Administered 2023-09-16 (×2): 5 [IU] via SUBCUTANEOUS
  Filled 2023-09-10 (×3): qty 1

## 2023-09-10 MED ORDER — INSULIN ASPART 100 UNIT/ML IJ SOLN
0.0000 [IU] | Freq: Every day | INTRAMUSCULAR | Status: DC
  Administered 2023-09-10: 2 [IU] via SUBCUTANEOUS
  Filled 2023-09-10: qty 1

## 2023-09-10 MED ORDER — CHLORDIAZEPOXIDE HCL 25 MG PO CAPS
25.0000 mg | ORAL_CAPSULE | ORAL | Status: AC
  Administered 2023-09-12 (×2): 25 mg via ORAL
  Filled 2023-09-10 (×2): qty 1

## 2023-09-10 MED ORDER — HYDROXYZINE HCL 25 MG PO TABS: 25.0000 mg | ORAL_TABLET | Freq: Four times a day (QID) | ORAL | Status: AC | PRN

## 2023-09-10 MED ORDER — THIAMINE HCL 100 MG/ML IJ SOLN
100.0000 mg | Freq: Once | INTRAMUSCULAR | Status: AC
  Administered 2023-09-10: 100 mg via INTRAMUSCULAR
  Filled 2023-09-10: qty 2

## 2023-09-10 MED ORDER — CHLORDIAZEPOXIDE HCL 25 MG PO CAPS
25.0000 mg | ORAL_CAPSULE | Freq: Every day | ORAL | Status: AC
  Administered 2023-09-13: 25 mg via ORAL
  Filled 2023-09-10: qty 1

## 2023-09-10 MED ORDER — ONDANSETRON 4 MG PO TBDP: 4.0000 mg | ORAL_TABLET | Freq: Four times a day (QID) | ORAL | Status: AC | PRN

## 2023-09-10 MED ORDER — INSULIN GLARGINE-YFGN 100 UNIT/ML ~~LOC~~ SOLN
34.0000 [IU] | Freq: Two times a day (BID) | SUBCUTANEOUS | Status: DC
  Administered 2023-09-10: 34 [IU] via SUBCUTANEOUS
  Filled 2023-09-10 (×2): qty 0.34

## 2023-09-10 MED ORDER — CHLORDIAZEPOXIDE HCL 25 MG PO CAPS: 25.0000 mg | ORAL_CAPSULE | Freq: Four times a day (QID) | ORAL | Status: AC | PRN

## 2023-09-10 MED ORDER — INSULIN ASPART 100 UNIT/ML IJ SOLN
28.0000 [IU] | Freq: Three times a day (TID) | INTRAMUSCULAR | Status: DC
  Administered 2023-09-10: 28 [IU] via SUBCUTANEOUS

## 2023-09-10 NOTE — BH IP Treatment Plan (Signed)
 Interdisciplinary Treatment and Diagnostic Plan Update  09/10/2023 Time of Session: 10:46 AM  Kyle [name] MRN: [id number]  Principal Diagnosis: MDD (major depressive disorder)  Secondary Diagnoses: Principal Problem:   MDD (major depressive disorder)   Current Medications:  Current Facility-Administered Medications  Medication Dose Route Frequency Provider Last Rate Last Admin   acetaminophen  (TYLENOL ) tablet 650 mg  650 mg Oral Q6H PRN Dorthea Gauze, NP       alum & mag hydroxide-simeth (MAALOX/MYLANTA) 200-200-20 MG/5ML suspension 30 mL  30 mL Oral Q4H PRN Dorthea Gauze, NP       chlordiazePOXIDE (LIBRIUM) capsule 25 mg  25 mg Oral Q6H PRN Jadapalle, Sree, MD       chlordiazePOXIDE (LIBRIUM) capsule 25 mg  25 mg Oral QID Jadapalle, Sree, MD   25 mg at 09/10/23 0945   Followed by   Cecily Cohen ON 09/11/2023] chlordiazePOXIDE (LIBRIUM) capsule 25 mg  25 mg Oral TID Jadapalle, Sree, MD       Followed by   Cecily Cohen ON 09/12/2023] chlordiazePOXIDE (LIBRIUM) capsule 25 mg  25 mg Oral BH-qamhs Jadapalle, Sree, MD       Followed by   Cecily Cohen ON 09/13/2023] chlordiazePOXIDE (LIBRIUM) capsule 25 mg  25 mg Oral Daily Jadapalle, Sree, MD       feeding supplement (GLUCERNA SHAKE) (GLUCERNA SHAKE) liquid 237 mL  237 mL Oral TID BM Jadapalle, Sree, MD       hydrOXYzine  (ATARAX ) tablet 25 mg  25 mg Oral Q6H PRN Jadapalle, Sree, MD       insulin  aspart (novoLOG ) injection 0-15 Units  0-15 Units Subcutaneous TID WC Jadapalle, Sree, MD       insulin  aspart (novoLOG ) injection 0-5 Units  0-5 Units Subcutaneous QHS Jadapalle, Sree, MD       insulin  aspart (novoLOG ) injection 28 Units  28 Units Subcutaneous TID with meals Jadapalle, Sree, MD   28 Units at 09/10/23 0941   insulin  glargine-yfgn (SEMGLEE) injection 34 Units  34 Units Subcutaneous BID WC Jadapalle, Sree, MD   34 Units at 09/10/23 0940   loperamide (IMODIUM) capsule 2-4 mg  2-4 mg Oral PRN Jadapalle, Sree, MD       magnesium hydroxide (MILK OF  MAGNESIA) suspension 30 mL  30 mL Oral Daily PRN Dorthea Gauze, NP       multivitamin with minerals tablet 1 tablet  1 tablet Oral Daily Jadapalle, Sree, MD   1 tablet at 09/10/23 0945   OLANZapine (ZYPREXA) injection 5 mg  5 mg Intramuscular TID PRN Dorthea Gauze, NP       OLANZapine zydis (ZYPREXA) disintegrating tablet 5 mg  5 mg Oral TID PRN Dorthea Gauze, NP       ondansetron  (ZOFRAN -ODT) disintegrating tablet 4 mg  4 mg Oral Q6H PRN Jadapalle, Sree, MD       PTA Medications: Medications Prior to Admission  Medication Sig Dispense Refill Last Dose/Taking   benzonatate  (TESSALON  PERLES) 100 MG capsule Take 1 capsule (100 mg total) by mouth 3 (three) times daily as needed for cough. 30 capsule 0    FLONASE ALLERGY RELIEF 50 MCG/ACT nasal spray Place into both nostrils. (Patient not taking: Reported on 09/09/2023)      insulin  aspart (NOVOLOG ) 100 UNIT/ML FlexPen Inject 28 Units into the skin with breakfast, with lunch, and with evening meal.      ipratropium-albuterol  (DUONEB) 0.5-2.5 (3) MG/3ML SOLN Take 3 mLs by nebulization every 6 (six) hours as needed. 1080 mL 0    meclizine  (ANTIVERT )  12.5 MG tablet Take 1 tablet (12.5 mg total) by mouth 3 (three) times daily as needed for dizziness. 10 tablet 0     Patient Stressors: Substance abuse   Other: wife left him    Patient Strengths: Average or above average intelligence  Capable of independent living  Communication skills   Treatment Modalities: Medication Management, Group therapy, Case management,  1 to 1 session with clinician, Psychoeducation, Recreational therapy.   Physician Treatment Plan for Primary Diagnosis: MDD (major depressive disorder) Long Term Goal(s):     Short Term Goals:    Medication Management: Evaluate patient's response, side effects, and tolerance of medication regimen.  Therapeutic Interventions: 1 to 1 sessions, Unit Group sessions and Medication administration.  Evaluation of Outcomes:  Progressing  Physician Treatment Plan for Secondary Diagnosis: Principal Problem:   MDD (major depressive disorder)  Long Term Goal(s):     Short Term Goals:       Medication Management: Evaluate patient's response, side effects, and tolerance of medication regimen.  Therapeutic Interventions: 1 to 1 sessions, Unit Group sessions and Medication administration.  Evaluation of Outcomes: Progressing   RN Treatment Plan for Primary Diagnosis: MDD (major depressive disorder) Long Term Goal(s): Knowledge of disease and therapeutic regimen to maintain health will improve  Short Term Goals: Ability to remain free from injury will improve, Ability to verbalize frustration and anger appropriately will improve, Ability to demonstrate self-control, Ability to participate in decision making will improve, Ability to verbalize feelings will improve, Ability to disclose and discuss suicidal ideas, Ability to identify and develop effective coping behaviors will improve, and Compliance with prescribed medications will improve  Medication Management: RN will administer medications as ordered by provider, will assess and evaluate patient's response and provide education to patient for prescribed medication. RN will report any adverse and/or side effects to prescribing provider.  Therapeutic Interventions: 1 on 1 counseling sessions, Psychoeducation, Medication administration, Evaluate responses to treatment, Monitor vital signs and CBGs as ordered, Perform/monitor CIWA, COWS, AIMS and Fall Risk screenings as ordered, Perform wound care treatments as ordered.  Evaluation of Outcomes: Progressing   LCSW Treatment Plan for Primary Diagnosis: MDD (major depressive disorder) Long Term Goal(s): Safe transition to appropriate next level of care at discharge, Engage patient in therapeutic group addressing interpersonal concerns.  Short Term Goals: Engage patient in aftercare planning with referrals and resources,  Increase social support, Increase ability to appropriately verbalize feelings, Increase emotional regulation, Facilitate acceptance of mental health diagnosis and concerns, Facilitate patient progression through stages of change regarding substance use diagnoses and concerns, Identify triggers associated with mental health/substance abuse issues, and Increase skills for wellness and recovery  Therapeutic Interventions: Assess for all discharge needs, 1 to 1 time with Social worker, Explore available resources and support systems, Assess for adequacy in community support network, Educate family and significant other(s) on suicide prevention, Complete Psychosocial Assessment, Interpersonal group therapy.  Evaluation of Outcomes: Progressing   Progress in Treatment: Attending groups: No. Participating in groups: No. Taking medication as prescribed: Yes. Toleration medication: Yes. Family/Significant other contact made: No, will contact:  CSW will contact if given permission  Patient understands diagnosis: Yes. Discussing patient identified problems/goals with staff: Yes. Medical problems stabilized or resolved: Yes. Denies suicidal/homicidal ideation: Yes. Issues/concerns per patient self-inventory: No. Other: None  New problem(s) identified: No, Describe:  None identified   New Short Term/Long Term Goal(s): detox, elimination of symptoms of psychosis, medication management for mood stabilization; elimination of SI thoughts; development of comprehensive mental  wellness/sobriety plan.   Patient Goals:  "To get me off this alcohol"   Discharge Plan or Barriers: CSW will assist with appropriate discharge planning   Reason for Continuation of Hospitalization: Medication stabilization Withdrawal symptoms  Estimated Length of Stay:  1 to 7 days   Last 3 [place] Suicide Severity Risk Score: Flowsheet Row Admission (Current) from 09/09/2023 in [facility] Most recent  reading at 09/09/2023 10:40 PM ED from 09/09/2023 in [facility] Emergency Department at [facility] Most recent reading at 09/09/2023 12:02 PM ED from 09/01/2023 in [facility] Emergency Department at [facility] Most recent reading at 09/01/2023  1:20 PM  C-SSRS RISK CATEGORY No Risk High Risk No Risk       Last PHQ 2/9 Scores:    11/07/2015    9:27 AM 10/04/2015    3:23 PM 08/08/2015    9:08 AM  Depression screen PHQ 2/9  Decreased Interest 0 0 0  Down, Depressed, Hopeless 0 0 0  PHQ - 2 Score 0 0 0    Scribe for Treatment Team: Claudio Culver, [place] 09/10/2023 11:19 AM

## 2023-09-10 NOTE — Progress Notes (Signed)
   09/10/23 1300  Psych Admission Type (Psych Patients Only)  Admission Status Involuntary  Psychosocial Assessment  Patient Complaints Anxiety;Depression  Eye Contact Brief  Facial Expression Flat  Affect Depressed  Speech Soft  Interaction Minimal  Motor Activity Slow;Unsteady (refuses walker)  Appearance/Hygiene In scrubs  Behavior Characteristics Cooperative  Mood Anxious;Depressed  Thought Process  Coherency WDL  Content WDL  Delusions None reported or observed  Perception WDL  Hallucination None reported or observed  Judgment Impaired  Confusion None  Danger to Self  Current suicidal ideation? Denies  Agreement Not to Harm Self Yes  Description of Agreement verbal  Danger to Others  Danger to Others None reported or observed

## 2023-09-10 NOTE — Group Note (Signed)
 Physical/Occupational Therapy Group Note  Group Topic: Yoga  Group Date: 09/10/2023 Start Time: 1300 End Time: 1330 Facilitators: Marita Burnsed, Otelia Blew, PT   Group Description: Group participated with series of yoga poses, designed to emphasize functional standing balance, core stability, generalized flexibility and overall posture.  Incorporated deep breathing techniques with poses, working to promote relaxation, mindfulness and focus with targeted activities.   Discussed benefits of yoga in improving mood and self-esteem, reducing stress and anxiety, and promoting functional strength and balance for each participant.  Discussed ways to integrate into each participant's daily routine.  Provided handout with written and pictorial descriptions of included yoga movements to be utilized as appropriate outside of group time.  Therapeutic Goal(s):  Demonstrate safe ability to participate with yoga poses during group activity. Identify one benefit of participation with yoga poses as part of each participant's exercise/movement routine. Identify 1-2 individual poses that participant feels most beneficial to his/her needs and that he/she can easily replicate outside of group.  Individual Participation: Did not attend  Participation Level:   Participation Quality:   Behavior:   Speech/Thought Process:   Affect/Mood:   Insight:   Judgement:   Modes of Intervention:   Patient Response to Interventions:    Plan: Continue to engage patient in PT/OT groups 1 - 2x/week.  Lavenia Post PT, DPT 09/10/23, 1:52 PM

## 2023-09-10 NOTE — Group Note (Signed)
 Date:  09/10/2023 Time:  4:21 PM  Group Topic/Focus: Group Activity- Bingo        Participation Level:  Active  Participation Quality:  Appropriate  Affect:  Appropriate

## 2023-09-10 NOTE — Progress Notes (Signed)
 NUTRITION ASSESSMENT  Pt identified as at risk on the Malnutrition Screen Tool  INTERVENTION:  -Continue carb modified diet -Glucerna Shake po TID, each supplement provides 220 kcal and 10 grams of protein  -MVI with minerals daily -Case discussed with MD and DM coordinator regarding insulin  needs due to hyperglycemia   NUTRITION DIAGNOSIS: Unintentional weight loss related to sub-optimal intake as evidenced by pt report.   Goal: Pt to meet >/= 90% of their estimated nutrition needs.  Monitor:  PO intake  Assessment:  Pt admitted under IVC and SI.   Pt currently on a carb modified diet. No meal completion data available to assess at this time.   Pt endorses ETOH use; consumes 1-2 beers 2-3 days per week. He admits to desiring help to stop drinking.   Reviewed wt hx; pt has experienced a 8.9% wt loss over the past 9 months, which is not significant for time frame. Noted wt on 08/11/23 may be an outlier. Also suspect some wt loss related to uncontrolled DM.  8.9% x 9 months  Lab Results  Component Value Date   HGBA1C 9.7 (H) 09/09/2023   PTA DM medications are 28 units insulin  aspart TID amd 34 units lantus BID.   Labs reviewed: CBGS: 179-242 (inpatient orders for glycemic control are none). Tox screen negative.   63 y.o. male  Height: Ht Readings from Last 1 Encounters:  09/09/23 5\' 8"  (1.727 m)    Weight: Wt Readings from Last 1 Encounters:  09/09/23 63.7 kg    Weight Hx: Wt Readings from Last 10 Encounters:  09/09/23 63.7 kg  09/09/23 68 kg  09/01/23 67.6 kg  08/11/23 72.6 kg  12/17/22 69.9 kg  08/07/22 67.9 kg  11/12/21 66.9 kg  09/04/21 66.9 kg  08/20/21 68 kg  06/28/20 67.6 kg    BMI:  Body mass index is 21.36 kg/m. BMI WDL.   Estimated Nutritional Needs: Kcal: 25-30 kcal/kg Protein: > 1 gram protein/kg Fluid: 1 ml/kcal  Diet Order:  Diet Order             Diet Carb Modified Fluid consistency: Thin; Room service appropriate? Yes  Diet  effective now                  Pt is also offered choice of unit snacks mid-morning and mid-afternoon.  Pt is eating as desired.   Lab results and medications reviewed.   Herschel Lords, RD, LDN, CDCES Registered Dietitian III Certified Diabetes Care and Education Specialist If unable to reach this RD, please use "RD Inpatient" group chat on secure chat between hours of 8am-4 pm daily

## 2023-09-10 NOTE — Plan of Care (Signed)
   Problem: Education: Goal: Emotional status will improve Outcome: Progressing Goal: Mental status will improve Outcome: Progressing

## 2023-09-10 NOTE — H&P (Addendum)
 Psychiatric Admission Assessment Adult  Patient Identification: Kyle [name] MRN:  [id number] Date of Evaluation:  09/10/2023 Chief Complaint:  MDD (major depressive disorder) [F32.9]   History of Present Illness: Kyle [name] is a 63 y.o. male admitted: Presented to the EDfor 09/09/2023  7:22 AM for increased depression and suicidal ideations, along with alcohol abuse. There are no documented previous  psychiatric diagnoses of   and has a past medical history of  DM2, HTN, DDD, CAD, etc.. His current presentation of increased anxiety, hopelessness, worthlessness and self harm thoughts  is most consistent with his current psychiatric/mental health concerns Patient is admitted to [facility] psych unit with Q15 min safety monitoring. Multidisciplinary team approach is offered. Medication management; group/milieu therapy is offered.   Today on interview patient is noted to be.  Break-up with his girlfriend lived together for 2 years.  He reports the break-up occurred 1 to 2 weeks ago.  He did acknowledge that the girlfriend left him because of his excessive drinking.  He reports that his girlfriend probably went back to her ex-boyfriend.  He reports having history of alcohol use for the last 40 years and he drinks on regular basis 3-4 shots of hard liquor, 3 beers of 16 ounce.  He has history of blackouts but denies using it as an eye-opener denies current history of withdrawals or DTs or seizures.  He reports feeling depressed in the last few days but denies hopelessness or worthlessness.  He reports low energy poor motivation, poor sleep and appetite.  He is denying current suicidal/homicidal ideation.  He did acknowledge that he was drunk and called 911 for suicidal ideation and he did acknowledge that he had a loaded gun in his hand.  He informed the provider that the police took his gun.  He reports having anxiety and panic attacks.  He reports elevated mood when he is drunk but denies any recent or  previous episodes of mania/hypomania.  He denies any history of physical abuse and denies nightmares or flashbacks. 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 Yes.    Has the patient been a risk to self in the past 6 months? No.  Has the patient been a risk to self within the distant past? No.  Is the patient a risk to others? No.  Has the patient been a risk to others in the past 6 months? No.  Has the patient been a risk to others within the distant past? No.   [place]  Scale:  Flowsheet Row Admission (Current) from 09/09/2023 in [facility] BEHAVIORAL MEDICINE Most recent reading at 09/09/2023 10:40 PM ED from 09/09/2023 in [facility] Emergency Department at [facility] Most recent reading at 09/09/2023 12:02 PM ED from 09/01/2023 in [facility] Emergency Department at [facility] Most recent reading at  09/01/2023  1:20 PM  C-SSRS RISK CATEGORY No Risk High Risk No Risk        Past Medical History:  Past Medical History:  Diagnosis Date   Allergy    Asthma    Chronic back pain 1981   a.followed by pain clinic and neurosurgery.   COPD (chronic obstructive pulmonary disease) (HCC)    Coronary artery disease involving native coronary artery    a. 10/2012 CABG x 4 @ [facility] (LIMA->LAD, VG->RI, VG->OM, VG->PDA);  b. 08/2014 Lexiscan  MV: low risk; c. 02/2019 MV: EF 53%, apical defect (atten vs scar), No ischemia-->low risk; d. 08/2022 MV: large area of inflat/inf ischemia and scar. Nl EF.   DDD (degenerative disc disease)    Diastolic dysfunction    a. 02/2019 Echo: EF 60-65%, mod LVH. Gr2 DD. Nl RV fxn. Unable to exclude bicuspid AoV; b. 04/2020 Echo: EF 50-55% anteroseptal HK, GrI DD, nl rV fxn, mild Ao sclerosis.   Dilated aortic root (HCC)    a. 02/2019 Echo: Ao root 44mm, Asc Ao 40mm, Ao arch 27mm; b. 04/2020 Echo: Ao root 45mm. Asc Ao 39mm; c. 06/2020 CTA Chest: No Ao aneurysm or dissection.   DM2 (diabetes mellitus, type 2) (HCC)    Fainting spell    GERD (gastroesophageal reflux disease)    Hepatitis C    2/2 tattoo   History of medication noncompliance    Hyperlipidemia with target LDL less than 70    Hypertension    Polysubstance abuse (HCC)    a. ongoing daily tobacco abuse and etoh (on the weekends), remote crack/cocaine abuse last used approximately in 2006   S/P CABG x 4    a. LIMA-LAD, SVG-Ramus, SVG-OM,SVG-dRCA   Urine incontinence     Past Surgical History:  Procedure Laterality Date   CARDIAC CATHETERIZATION  11/2012   [facility] - Multivessel CAD   CORONARY ARTERY BYPASS GRAFT  8/14   CABG x 4; LIMA-LAD, SVG-RI, SVG-OM1, SVG-rPDA (Atretic RIMA).   neck injection     Family History:  Family History  Problem Relation Age of Onset   Heart disease Mother    Heart disease Father    Hypertension Father    Diabetes Sister    Cancer Sister        skin  cancer   Diabetes Other    Heart disease Other     Social History:  Social History   Substance and Sexual Activity  Alcohol Use Yes   Alcohol/week: 6.0 standard drinks of alcohol   Types: 6 Cans of beer per week   Comment: pt binge drinking last 5 days - 15 to 20 beers daily     Social History   Substance and Sexual Activity  Drug Use Not Currently      Allergies:   Allergies  Allergen Reactions   Food    Amoxicillin Itching and Swelling   Cyclobenzaprine  Itching and Rash    rash rash   Gabapentin  Nausea And Vomiting   Methadone Itching and Nausea And Vomiting    Reaction: Gi upset   Naproxen  Hives   Penicillin G Itching   Propoxyphene Hives   Tramadol Nausea And Vomiting,  Hives and Rash   Trazodone Nausea And Vomiting    Reaction: GI upset  Other Reaction(s): Tachycardia   Trazodone And Nefazodone     Reaction: GI upset   Lab Results:  Results for orders placed or performed during the hospital encounter of 09/09/23 (from the past 48 hours)  Glucose, capillary     Status: Abnormal   Collection Time: 09/10/23  7:41 AM  Result Value Ref Range   Glucose-Capillary 179 (H) 70 - 99 mg/dL    Comment: Glucose reference range applies only to samples taken after fasting for at least 8 hours.  Glucose, capillary     Status: Abnormal   Collection Time: 09/10/23 10:43 AM  Result Value Ref Range   Glucose-Capillary 137 (H) 70 - 99 mg/dL    Comment: Glucose reference range applies only to samples taken after fasting for at least 8 hours.  Glucose, capillary     Status: Abnormal   Collection Time: 09/10/23 11:58 AM  Result Value Ref Range   Glucose-Capillary 54 (L) 70 - 99 mg/dL    Comment: Glucose reference range applies only to samples taken after fasting for at least 8 hours.  Glucose, capillary     Status: Abnormal   Collection Time: 09/10/23 12:55 PM  Result Value Ref Range   Glucose-Capillary 112 (H) 70 - 99 mg/dL    Comment: Glucose reference range applies only  to samples taken after fasting for at least 8 hours.    Blood Alcohol level:  Lab Results  Component Value Date   ETH 191 (H) 09/09/2023    Metabolic Disorder Labs:  Lab Results  Component Value Date   HGBA1C 9.7 (H) 09/09/2023   MPG 232 09/09/2023   No results found for: "PROLACTIN" Lab Results  Component Value Date   CHOL 156 06/08/2018   TRIG 212 (H) 06/08/2018   HDL 36 (L) 06/08/2018   CHOLHDL 4.3 06/08/2018   VLDL 16.1 (H) 11/24/2013   LDLCALC 78 06/08/2018   LDLCALC 78 08/14/2015    Current Medications: Current Facility-Administered Medications  Medication Dose Route Frequency Provider Last Rate Last Admin   acetaminophen  (TYLENOL ) tablet 650 mg  650 mg Oral Q6H PRN Dorthea Gauze, NP       alum & mag hydroxide-simeth (MAALOX/MYLANTA) 200-200-20 MG/5ML suspension 30 mL  30 mL Oral Q4H PRN Dorthea Gauze, NP       chlordiazePOXIDE (LIBRIUM) capsule 25 mg  25 mg Oral Q6H PRN Arvis Miguez, MD       chlordiazePOXIDE (LIBRIUM) capsule 25 mg  25 mg Oral QID Kalii Chesmore, MD   25 mg at 09/10/23 0945   Followed by   Cecily Cohen ON 09/11/2023] chlordiazePOXIDE (LIBRIUM) capsule 25 mg  25 mg Oral TID Larraine Argo, MD       Followed by   Cecily Cohen ON 09/12/2023] chlordiazePOXIDE (LIBRIUM) capsule 25 mg  25 mg Oral Ardelle Kos, MD       Followed by   Cecily Cohen ON 09/13/2023] chlordiazePOXIDE (LIBRIUM) capsule 25 mg  25 mg Oral Daily Tom Macpherson, MD       feeding supplement (GLUCERNA SHAKE) (GLUCERNA SHAKE) liquid 237 mL  237 mL Oral TID BM Eulalah Rupert, MD       hydrOXYzine  (ATARAX ) tablet 25 mg  25 mg Oral Q6H PRN Emoree Sasaki, MD       insulin  aspart (novoLOG ) injection 0-15 Units  0-15 Units Subcutaneous TID WC Jaylon Boylen, MD       insulin  aspart (novoLOG ) injection 0-5  Units  0-5 Units Subcutaneous QHS Janiyha Montufar, MD       [START ON 09/11/2023] insulin  glargine-yfgn (SEMGLEE) injection 15 Units  15 Units Subcutaneous Daily Makeisha Jentsch, MD        loperamide (IMODIUM) capsule 2-4 mg  2-4 mg Oral PRN Crystalann Korf, MD       magnesium hydroxide (MILK OF MAGNESIA) suspension 30 mL  30 mL Oral Daily PRN Dorthea Gauze, NP       multivitamin with minerals tablet 1 tablet  1 tablet Oral Daily Kemonte Ullman, MD   1 tablet at 09/10/23 0945   OLANZapine (ZYPREXA) injection 5 mg  5 mg Intramuscular TID PRN Dorthea Gauze, NP       OLANZapine zydis (ZYPREXA) disintegrating tablet 5 mg  5 mg Oral TID PRN Dorthea Gauze, NP       ondansetron  (ZOFRAN -ODT) disintegrating tablet 4 mg  4 mg Oral Q6H PRN Angelus Hoopes, MD       PTA Medications: Medications Prior to Admission  Medication Sig Dispense Refill Last Dose/Taking   benzonatate  (TESSALON  PERLES) 100 MG capsule Take 1 capsule (100 mg total) by mouth 3 (three) times daily as needed for cough. 30 capsule 0    FLONASE ALLERGY RELIEF 50 MCG/ACT nasal spray Place into both nostrils. (Patient not taking: Reported on 09/09/2023)      insulin  aspart (NOVOLOG ) 100 UNIT/ML FlexPen Inject 28 Units into the skin with breakfast, with lunch, and with evening meal.      ipratropium-albuterol  (DUONEB) 0.5-2.5 (3) MG/3ML SOLN Take 3 mLs by nebulization every 6 (six) hours as needed. 1080 mL 0    meclizine  (ANTIVERT ) 12.5 MG tablet Take 1 tablet (12.5 mg total) by mouth 3 (three) times daily as needed for dizziness. 10 tablet 0     Psychiatric Specialty Exam:  Presentation  General Appearance:  Casual  Eye Contact: Minimal  Speech: Normal Rate  Speech Volume: Decreased    Mood and Affect  Mood: Depressed; Hopeless  Affect: Depressed; Flat   Thought Process  Thought Processes: Coherent  Descriptions of Associations:Intact  Orientation:Full (Time, Place and Person)  Thought Content:Logical  Hallucinations:Hallucinations: None  Ideas of Reference:None  Suicidal Thoughts:Suicidal Thoughts: Yes, Passive  Homicidal Thoughts:Homicidal Thoughts: No   Sensorium  Memory: Immediate  Fair; Recent Fair; Remote Poor  Judgment: Impaired  Insight: Shallow   Executive Functions  Concentration: Fair  Attention Span: Fair  Recall: [employer] of Knowledge: Fair  Language: Fair   Psychomotor Activity  Psychomotor Activity: Psychomotor Activity: Normal   Assets  Assets: Communication Skills; Desire for Improvement; Resilience    Musculoskeletal: Strength & Muscle Tone: within normal limits Gait & Station: normal  Physical Exam: Physical Exam Vitals and nursing note reviewed.  HENT:     Head: Normocephalic.     Nose: Nose normal.     Mouth/Throat:     Mouth: Mucous membranes are moist.  Cardiovascular:     Pulses: Normal pulses.  Pulmonary:     Effort: Pulmonary effort is normal.  Abdominal:     General: Bowel sounds are normal.  Skin:    General: Skin is warm.  Neurological:     Mental Status: He is alert.    Review of Systems  Constitutional: Negative.   HENT: Negative.    Eyes: Negative.   Respiratory: Negative.    Cardiovascular: Negative.   Skin: Negative.   Neurological: Negative.    Blood pressure (!) 161/94, pulse 81, temperature (!) 97.3 F (36.3 C), resp.  rate 17, height 5\' 8"  (1.727 m), weight 63.7 kg, SpO2 100%. Body mass index is 21.36 kg/m.  Principal Diagnosis: MDD (major depressive disorder), recurrent severe, without psychosis (HCC) Diagnosis:  Principal Problem:   MDD (major depressive disorder), recurrent severe, without psychosis (HCC)   Clinical Decision Making: Patient with hx of chronic alcoholism, depression and anxiety admitted for worsening depression, hopelessness in the context of recent breakup with girlfriend. He was holding a loaded gun when cops showed. It was reported that cops took his gun and pt will continue inpatient hospitalization for stabilization.  Treatment Plan Summary:  Safety and Monitoring:             -- Involuntary admission to inpatient psychiatric unit for safety,  stabilization and treatment             -- Daily contact with patient to assess and evaluate symptoms and progress in treatment             -- Patient's case to be discussed in multi-disciplinary team meeting             -- Observation Level: q15 minute checks             -- Vital signs:  q12 hours             -- Precautions: suicide, elopement, and assault   2. Psychiatric Diagnoses and Treatment:                Started him on zoloft 25 mg daily with plan to titrate it up to 50 mg in few days. Initiated CIWA protocol with librium taper   -- The risks/benefits/side-effects/alternatives to this medication were discussed in detail with the patient and time was given for questions. The patient consents to medication trial.                -- Metabolic profile and EKG monitoring obtained while on an atypical antipsychotic (BMI: Lipid Panel: HbgA1c: QTc:)              -- Encouraged patient to participate in unit milieu and in scheduled group therapies                            3. Medical Issues Being Addressed:    Elevated blood pressure-reviewed the chart and patient takes metoprolol  37.5 mg twice daily.  Added to the current medication and also added clonidine 0.1 mg twice daily will continue to monitor blood pressure closely. 4. Discharge Planning:              -- Social work and case management to assist with discharge planning and identification of hospital follow-up needs prior to discharge             -- Estimated LOS: 5-7 days             -- Discharge Concerns: Need to establish a safety plan; Medication compliance and effectiveness             -- Discharge Goals: Return home with outpatient referrals follow ups  Physician Treatment Plan for Primary Diagnosis: MDD (major depressive disorder), recurrent severe, without psychosis (HCC) Long Term Goal(s): Improvement in symptoms so as ready for discharge  Short Term Goals: Ability to identify changes in lifestyle to reduce recurrence of  condition will improve, Ability to verbalize feelings will improve, Ability to disclose and discuss suicidal ideas, Ability to demonstrate self-control will improve, and Ability to  identify and develop effective coping behaviors will improve  Physician Treatment Plan for Secondary Diagnosis: Principal Problem:   MDD (major depressive disorder), recurrent severe, without psychosis (HCC)  Long Term Goal(s): Improvement in symptoms so as ready for discharge  Short Term Goals: Ability to identify changes in lifestyle to reduce recurrence of condition will improve, Ability to verbalize feelings will improve, Ability to disclose and discuss suicidal ideas, Ability to demonstrate self-control will improve, Ability to identify and develop effective coping behaviors will improve, Ability to maintain clinical measurements within normal limits will improve, Compliance with prescribed medications will improve, and Ability to identify triggers associated with substance abuse/mental health issues will improve  I certify that inpatient services furnished can reasonably be expected to improve the patient's condition.    Deonte Otting, MD 5/14/20251:59 PM

## 2023-09-10 NOTE — BHH Suicide Risk Assessment (Signed)
 [facility] Admission Suicide Risk Assessment   Nursing information obtained from:  Patient Demographic factors:  Male, Access to firearms, Living alone Current Mental Status:  NA Loss Factors:  Loss of significant relationship (GF left him recently) Historical Factors:  Prior suicide attempts Risk Reduction Factors:  Employed  Total Time spent with patient: 30 minutes Principal Problem: MDD (major depressive disorder), recurrent severe, without psychosis (HCC) Diagnosis:  Principal Problem:   MDD (major depressive disorder), recurrent severe, without psychosis (HCC)  Subjective Data: Kyle [name] is a 63 y.o. male admitted: Presented to the EDfor 09/09/2023  7:22 AM for increased depression and suicidal ideations, along with alcohol abuse. There are no documented previous  psychiatric diagnoses of   and has a past medical history of  DM2, HTN, DDD, CAD, etc.. His current presentation of increased anxiety, hopelessness, worthlessness and self harm thoughts  is most consistent with his current psychiatric/mental health concerns Patient is admitted to [facility] psych unit with Q15 min safety monitoring. Multidisciplinary team approach is offered. Medication management; group/milieu therapy is offered.   Continued Clinical Symptoms:  Alcohol Use Disorder Identification Test Final Score (AUDIT): 11 The "Alcohol Use Disorders Identification Test", Guidelines for Use in Primary Care, Second Edition.  World Science writer [facility]). Score between 0-7:  no or low risk or alcohol related problems. Score between 8-15:  moderate risk of alcohol related problems. Score between 16-19:  high risk of alcohol related problems. Score 20 or above:  warrants further diagnostic evaluation for alcohol dependence and treatment.   CLINICAL FACTORS:   Depression:   Comorbid alcohol abuse/dependence   Musculoskeletal: Strength & Muscle Tone: within normal limits Gait & Station: normal Patient leans: N/A  Psychiatric  Specialty Exam:  Presentation  General Appearance:  Casual  Eye Contact: Minimal  Speech: Normal Rate  Speech Volume: Decreased  Handedness: Right   Mood and Affect  Mood: Depressed; Hopeless  Affect: Depressed; Flat   Thought Process  Thought Processes: Coherent  Descriptions of Associations:Intact  Orientation:Full (Time, Place and Person)  Thought Content:Logical  History of Schizophrenia/Schizoaffective disorder:No  Duration of Psychotic Symptoms:No data recorded Hallucinations:Hallucinations: None  Ideas of Reference:None  Suicidal Thoughts:Suicidal Thoughts: Yes, Passive  Homicidal Thoughts:Homicidal Thoughts: No   Sensorium  Memory: Immediate Fair; Recent Fair; Remote Poor  Judgment: Impaired  Insight: Shallow   Executive Functions  Concentration: Fair  Attention Span: Fair  Recall: [employer] of Knowledge: Fair  Language: Fair   Psychomotor Activity  Psychomotor Activity: Psychomotor Activity: Normal   Assets  Assets: Communication Skills; Desire for Improvement; Resilience   Sleep  Sleep: Sleep: Fair    Physical Exam: Physical Exam Vitals and nursing note reviewed.    ROS Blood pressure (!) 161/94, pulse 81, temperature (!) 97.3 F (36.3 C), resp. rate 17, height 5\' 8"  (1.727 m), weight 63.7 kg, SpO2 100%. Body mass index is 21.36 kg/m.   COGNITIVE FEATURES THAT CONTRIBUTE TO RISK:  None    SUICIDE RISK:   Mild:  Suicidal ideation of limited frequency, intensity, duration, and specificity.  There are no identifiable plans, no associated intent, mild dysphoria and related symptoms, good self-control (both objective and subjective assessment), few other risk factors, and identifiable protective factors, including available and accessible social support.  PLAN OF CARE: Patient is admitted to [facility] psych unit with Q15 min safety monitoring. Multidisciplinary team approach is offered. Medication  management; group/milieu therapy is offered.   I certify that inpatient services furnished can reasonably be expected to improve  the patient's condition.   Aurelia Blotter, MD 09/10/2023, 1:57 PM

## 2023-09-10 NOTE — Inpatient Diabetes Management (Addendum)
 Inpatient Diabetes Program Recommendations  AACE/ADA: New Consensus Statement on Inpatient Glycemic Control   Target Ranges:  Prepandial:   less than 140 mg/dL      Peak postprandial:   less than 180 mg/dL (1-2 hours)      Critically ill patients:  140 - 180 mg/dL    Latest Reference Range & Units 09/09/23 11:39 09/09/23 16:15 09/10/23 07:41  Glucose-Capillary 70 - 99 mg/dL 161 (H)  Novolog  3 units 242 (H)  Novolog  3 units 179 (H)  Novolog  28 units @9 :41  Semglee 34 units @9 :40   Review of Glycemic Control  Diabetes history: DM2 Outpatient Diabetes medications: Novolog  28 units TID with meals, Lantus 34 units BID (was on med list on 5/13 but no longer listed) Current orders for Inpatient glycemic control: Novolog  0-15 units TID with meals,  Novolog  0-5 units at bedtime, Semglee 34 units BID, Novolog  28 units TID with meals  Inpatient Diabetes Program Recommendations:    Insulin : Please consider discontinuing Novolog  28 units TID meal coverage and decrease Semglee to 15 units daily (to start 09/11/23).   NOTE: No basal insulin  given on 09/09/23 and glucose 179 mg/dl this morning.  Noted Semglee and Novolog  meal coverage added today and already given this morning. Recommend to monitor patient closely for hypoglycemia given amount of insulin  given this morning and fasting CBG 179 mg/dl.  Sent chat message to Dr. Teri Fergusson, RN regarding recommendations and monitoring for hypoglycemia.  Thanks, Beacher Limerick, RN, MSN, CDCES Diabetes Coordinator Inpatient Diabetes Program [phone] (Team Pager from 8am to 5pm)

## 2023-09-10 NOTE — Plan of Care (Signed)
   Problem: Education: Goal: Emotional status will improve Outcome: Progressing Goal: Mental status will improve Outcome: Progressing   Problem: Activity: Goal: Sleeping patterns will improve Outcome: Progressing

## 2023-09-11 DIAGNOSIS — F332 Major depressive disorder, recurrent severe without psychotic features: Principal | ICD-10-CM

## 2023-09-11 LAB — GLUCOSE, CAPILLARY
Glucose-Capillary: 171 mg/dL — ABNORMAL HIGH (ref 70–99)
Glucose-Capillary: 164 mg/dL — ABNORMAL HIGH (ref 70–99)
Glucose-Capillary: 172 mg/dL — ABNORMAL HIGH (ref 70–99)
Glucose-Capillary: 185 mg/dL — ABNORMAL HIGH (ref 70–99)

## 2023-09-11 NOTE — Progress Notes (Signed)
 [facility] MD Progress Note  09/11/2023 3:47 PM SHERROD HATCHELL  MRN:  [id number]  Kyle [name] is a 63 y.o. male admitted: Presented to the EDfor 09/09/2023  7:22 AM for increased depression and suicidal ideations, along with alcohol abuse. There are no documented previous  psychiatric diagnoses of   and has a past medical history of  DM2, HTN, DDD, CAD, etc.. His current presentation of increased anxiety, hopelessness, worthlessness and self harm thoughts  is most consistent with his current psychiatric/mental health concerns Patient is admitted to [facility] psych unit with Q15 min safety monitoring. Multidisciplinary team approach is offered. Medication management; group/milieu therapy is offered.  Subjective:  Chart reviewed, case discussed in multidisciplinary meeting, patient seen during rounds.  Today on interview patient is noted to be sitting in the day area.  He offers no complaints.  He is taking his medications with no reported side effects.  Provider and patient discussed about his home blood pressure medications given the elevated blood pressure readings he had since his admission.  He did acknowledge taking metoprolol  at home.  He denies current SI/HI/plan.  He did discuss the aftercare plan of going to the rehab facility after the discharge.  He denies hallucinations.  Sleep: Fair  Appetite:  Fair  Past Psychiatric History: see h&P Family History:  Family History  Problem Relation Age of Onset   Heart disease Mother    Heart disease Father    Hypertension Father    Diabetes Sister    Cancer Sister        skin cancer   Diabetes Other    Heart disease Other    Social History:  Social History   Substance and Sexual Activity  Alcohol Use Yes   Alcohol/week: 6.0 standard drinks of alcohol   Types: 6 Cans of beer per week   Comment: pt binge drinking last 5 days - 15 to 20 beers daily     Social History   Substance and Sexual Activity  Drug Use Not Currently    Social History    Socioeconomic History   Marital status: Married    Spouse name: Not on file   Number of children: Not on file   Years of education: 14   Highest education level: Not on file  Occupational History   Occupation: Warden/ranger Work    Comment: Disability  Tobacco Use   Smoking status: Every Day    Current packs/day: 0.33    Average packs/day: 0.3 packs/day for 35.0 years (11.6 ttl pk-yrs)    Types: Cigarettes   Smokeless tobacco: Never   Tobacco comments:    a pack lasts 2 to 4 days   Vaping Use   Vaping status: Never Used  Substance and Sexual Activity   Alcohol use: Yes    Alcohol/week: 6.0 standard drinks of alcohol    Types: 6 Cans of beer per week    Comment: pt binge drinking last 5 days - 15 to 20 beers daily   Drug use: Not Currently   Sexual activity: Yes    Partners: Female    Comment: Condoms sometimes  Other Topics Concern   Not on file  Social History Narrative   Pt states wife recently left him prompting him to binge drink.       Mr. Kurowski grew up in the [place] area. He was in the [place]  [employer] for 4 years. 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 He is currently disabled. Mr. Baldassare lives by himself but has excellent support system. He is very close to his family. He is currently taking online courses through [employer]. He is trying to obtain a degree in social services and ministry. His goal is to work with at-risk young adults.Regular exercise: not recentlyCaffeine use: occasionally   Social Drivers of Corporate investment banker Strain: Not on file  Food Insecurity: No Food Insecurity (09/09/2023)   Hunger Vital Sign    Worried About Running Out of Food in the Last Year: Never true    Ran Out of Food in the Last Year: Never true  Transportation Needs: No Transportation Needs (09/09/2023)   PRAPARE - Administrator, Civil Service (Medical): No    Lack of  Transportation (Non-Medical): No  Physical Activity: Not on file  Stress: Not on file  Social Connections: Not on file   Past Medical History:  Past Medical History:  Diagnosis Date   Allergy    Asthma    Chronic back pain 1981   a.followed by pain clinic and neurosurgery.   COPD (chronic obstructive pulmonary disease) (HCC)    Coronary artery disease involving native coronary artery    a. 10/2012 CABG x 4 @ [facility] (LIMA->LAD, VG->RI, VG->OM, VG->PDA);  b. 08/2014 Lexiscan  MV: low risk; c. 02/2019 MV: EF 53%, apical defect (atten vs scar), No ischemia-->low risk; d. 08/2022 MV: large area of inflat/inf ischemia and scar. Nl EF.   DDD (degenerative disc disease)    Diastolic dysfunction    a. 02/2019 Echo: EF 60-65%, mod LVH. Gr2 DD. Nl RV fxn. Unable to exclude bicuspid AoV; b. 04/2020 Echo: EF 50-55% anteroseptal HK, GrI DD, nl rV fxn, mild Ao sclerosis.   Dilated aortic root (HCC)    a. 02/2019 Echo: Ao root 44mm, Asc Ao 40mm, Ao arch 27mm; b. 04/2020 Echo: Ao root 45mm. Asc Ao 39mm; c. 06/2020 CTA Chest: No Ao aneurysm or dissection.   DM2 (diabetes mellitus, type 2) (HCC)    Fainting spell    GERD (gastroesophageal reflux disease)    Hepatitis C    2/2 tattoo   History of medication noncompliance    Hyperlipidemia with target LDL less than 70    Hypertension    Polysubstance abuse (HCC)    a. ongoing daily tobacco abuse and etoh (on the weekends), remote crack/cocaine abuse last used approximately in 2006   S/P CABG x 4    a. LIMA-LAD, SVG-Ramus, SVG-OM,SVG-dRCA   Urine incontinence     Past Surgical History:  Procedure Laterality Date   CARDIAC CATHETERIZATION  11/2012   [facility] - Multivessel CAD   CORONARY ARTERY BYPASS GRAFT  8/14   CABG x 4; LIMA-LAD, SVG-RI, SVG-OM1, SVG-rPDA (Atretic RIMA).   neck injection      Current Medications: Current Facility-Administered Medications  Medication Dose Route Frequency Provider Last Rate Last Admin    acetaminophen  (TYLENOL ) tablet 650 mg  650 mg Oral Q6H PRN Dorthea Gauze, NP   650 mg at 09/10/23 2138   alum & mag hydroxide-simeth (MAALOX/MYLANTA) 200-200-20 MG/5ML suspension 30 mL  30 mL Oral Q4H PRN Dorthea Gauze, NP       chlordiazePOXIDE (LIBRIUM) capsule 25 mg  25 mg Oral Q6H PRN Duriel Deery, MD       chlordiazePOXIDE (LIBRIUM) capsule 25 mg  25 mg Oral TID Tiny Rietz, MD   25 mg at 09/11/23 1007   Followed by   [  START ON 09/12/2023] chlordiazePOXIDE (LIBRIUM) capsule 25 mg  25 mg Oral Ardelle Kos, MD       Followed by   Cecily Cohen ON 09/13/2023] chlordiazePOXIDE (LIBRIUM) capsule 25 mg  25 mg Oral Daily Damarius Karnes, MD       cloNIDine (CATAPRES) tablet 0.1 mg  0.1 mg Oral BID Miyana Mordecai, MD   0.1 mg at 09/11/23 1007   feeding supplement (GLUCERNA SHAKE) (GLUCERNA SHAKE) liquid 237 mL  237 mL Oral TID BM Jayveion Stalling, MD   237 mL at 09/11/23 1437   hydrOXYzine  (ATARAX ) tablet 25 mg  25 mg Oral Q6H PRN Jearlene Bridwell, MD       insulin  aspart (novoLOG ) injection 0-15 Units  0-15 Units Subcutaneous TID WC Gentri Guardado, MD   3 Units at 09/11/23 1215   insulin  aspart (novoLOG ) injection 0-5 Units  0-5 Units Subcutaneous QHS Graysin Luczynski, MD   2 Units at 09/10/23 2133   insulin  glargine-yfgn [facility]) injection 15 Units  15 Units Subcutaneous Daily Burkley Dech, MD   15 Units at 09/11/23 1010   loperamide (IMODIUM) capsule 2-4 mg  2-4 mg Oral PRN Ashaunti Treptow, MD       magnesium hydroxide (MILK OF MAGNESIA) suspension 30 mL  30 mL Oral Daily PRN Dorthea Gauze, NP       metoprolol  tartrate (LOPRESSOR ) tablet 37.5 mg  37.5 mg Oral BID Magalene Mclear, MD   37.5 mg at 09/11/23 1007   multivitamin with minerals tablet 1 tablet  1 tablet Oral Daily Yashar Inclan, MD   1 tablet at 09/11/23 1007   OLANZapine (ZYPREXA) injection 5 mg  5 mg Intramuscular TID PRN Dorthea Gauze, NP       OLANZapine zydis (ZYPREXA) disintegrating tablet 5 mg  5 mg Oral TID PRN  Dorthea Gauze, NP       ondansetron  (ZOFRAN -ODT) disintegrating tablet 4 mg  4 mg Oral Q6H PRN Arijana Narayan, MD       sertraline (ZOLOFT) tablet 25 mg  25 mg Oral Daily Jeffery Bachmeier, MD   25 mg at 09/11/23 1007    Lab Results:  Results for orders placed or performed during the hospital encounter of 09/09/23 (from the past 48 hours)  Glucose, capillary     Status: Abnormal   Collection Time: 09/10/23  7:41 AM  Result Value Ref Range   Glucose-Capillary 179 (H) 70 - 99 mg/dL    Comment: Glucose reference range applies only to samples taken after fasting for at least 8 hours.  Glucose, capillary     Status: Abnormal   Collection Time: 09/10/23 10:43 AM  Result Value Ref Range   Glucose-Capillary 137 (H) 70 - 99 mg/dL    Comment: Glucose reference range applies only to samples taken after fasting for at least 8 hours.  Glucose, capillary     Status: Abnormal   Collection Time: 09/10/23 11:58 AM  Result Value Ref Range   Glucose-Capillary 54 (L) 70 - 99 mg/dL    Comment: Glucose reference range applies only to samples taken after fasting for at least 8 hours.  Glucose, capillary     Status: Abnormal   Collection Time: 09/10/23 12:55 PM  Result Value Ref Range   Glucose-Capillary 112 (H) 70 - 99 mg/dL    Comment: Glucose reference range applies only to samples taken after fasting for at least 8 hours.  Glucose, capillary     Status: Abnormal   Collection Time: 09/10/23  4:41 PM  Result Value Ref  Range   Glucose-Capillary 185 (H) 70 - 99 mg/dL    Comment: Glucose reference range applies only to samples taken after fasting for at least 8 hours.  Glucose, capillary     Status: Abnormal   Collection Time: 09/10/23  8:26 PM  Result Value Ref Range   Glucose-Capillary 224 (H) 70 - 99 mg/dL    Comment: Glucose reference range applies only to samples taken after fasting for at least 8 hours.   Comment 1 Notify RN    Comment 2 Document in Chart   Glucose, capillary     Status: Abnormal    Collection Time: 09/11/23  7:41 AM  Result Value Ref Range   Glucose-Capillary 171 (H) 70 - 99 mg/dL    Comment: Glucose reference range applies only to samples taken after fasting for at least 8 hours.  Glucose, capillary     Status: Abnormal   Collection Time: 09/11/23 11:34 AM  Result Value Ref Range   Glucose-Capillary 164 (H) 70 - 99 mg/dL    Comment: Glucose reference range applies only to samples taken after fasting for at least 8 hours.    Blood Alcohol level:  Lab Results  Component Value Date   ETH 191 (H) 09/09/2023    Metabolic Disorder Labs: Lab Results  Component Value Date   HGBA1C 9.7 (H) 09/09/2023   MPG 232 09/09/2023   No results found for: "PROLACTIN" Lab Results  Component Value Date   CHOL 156 06/08/2018   TRIG 212 (H) 06/08/2018   HDL 36 (L) 06/08/2018   CHOLHDL 4.3 06/08/2018   VLDL 16.1 (H) 11/24/2013   LDLCALC 78 06/08/2018   LDLCALC 78 08/14/2015    Physical Findings: AIMS:  , ,  ,  ,    CIWA:  CIWA-Ar Total: 2 COWS:      Psychiatric Specialty Exam:  Presentation  General Appearance:  Appropriate for Environment; Casual  Eye Contact: Fair  Speech: Clear and Coherent  Speech Volume: Normal    Mood and Affect  Mood: Depressed; Hopeless  Affect: Depressed; Flat   Thought Process  Thought Processes: Coherent  Descriptions of Associations:Intact  Orientation:Full (Time, Place and Person)  Thought Content:Logical  Hallucinations:Hallucinations: None  Ideas of Reference:None  Suicidal Thoughts:Suicidal Thoughts: No  Homicidal Thoughts:Homicidal Thoughts: No   Sensorium  Memory: Immediate Fair; Recent Fair; Remote Fair  Judgment: Impaired  Insight: Shallow   Executive Functions  Concentration: Fair  Attention Span: Fair  Recall: Fair  Fund of Knowledge: Fair  Language: Fair   Psychomotor Activity  Psychomotor Activity: Psychomotor Activity: Normal  Musculoskeletal: Strength &  Muscle Tone: within normal limits Gait & Station: normal Assets  Assets: Manufacturing systems engineer; Desire for Improvement; Resilience    Physical Exam: Physical Exam Vitals and nursing note reviewed.  HENT:     Head: Normocephalic.     Nose: Nose normal.     Mouth/Throat:     Mouth: Mucous membranes are moist.  Cardiovascular:     Rate and Rhythm: Normal rate.  Pulmonary:     Effort: Pulmonary effort is normal.  Abdominal:     General: Bowel sounds are normal.  Skin:    General: Skin is warm.  Neurological:     Mental Status: He is alert.    Review of Systems  Constitutional: Negative.   HENT: Negative.    Eyes: Negative.   Cardiovascular: Negative.   Skin: Negative.    Blood pressure 136/86, pulse 75, temperature 98.7 F (37.1 C), resp. rate 15,  height 5\' 8"  (1.727 m), weight 63.7 kg, SpO2 100%. Body mass index is 21.36 kg/m.  Diagnosis: Principal Problem:   MDD (major depressive disorder), recurrent severe, without psychosis (HCC)  Clinical Decision Making: Patient with hx of chronic alcoholism, depression and anxiety admitted for worsening depression, hopelessness in the context of recent breakup with girlfriend. He was holding a loaded gun when cops showed. It was reported that cops took his gun and pt will continue inpatient hospitalization for stabilization.   Treatment Plan Summary:   Safety and Monitoring:             -- Involuntary admission to inpatient psychiatric unit for safety, stabilization and treatment             -- Daily contact with patient to assess and evaluate symptoms and progress in treatment             -- Patient's case to be discussed in multi-disciplinary team meeting             -- Observation Level: q15 minute checks             -- Vital signs:  q12 hours             -- Precautions: suicide, elopement, and assault   2. Psychiatric Diagnoses and Treatment:                 zoloft 25 mg daily with plan to titrate it up to 50 mg in few  days. Initiated CIWA protocol with librium taper   -- The risks/benefits/side-effects/alternatives to this medication were discussed in detail with the patient and time was given for questions. The patient consents to medication trial.                -- Metabolic profile and EKG monitoring obtained while on an atypical antipsychotic (BMI: Lipid Panel: HbgA1c: QTc:)              -- Encouraged patient to participate in unit milieu and in scheduled group therapies                            3. Medical Issues Being Addressed:    Elevated blood pressure-reviewed the chart and patient takes metoprolol  37.5 mg twice daily.  Added to the current medication and also added clonidine 0.1 mg twice daily will continue to monitor blood pressure closely. 4. Discharge Planning:              -- Social work and case management to assist with discharge planning and identification of hospital follow-up needs prior to discharge             -- Estimated LOS: 5-7 days             -- Discharge Concerns: Need to establish a safety plan; Medication compliance and effectiveness             -- Discharge Goals: Return home with outpatient referrals follow ups   Aurelia Blotter, MD 09/11/2023, 3:47 PM

## 2023-09-11 NOTE — Progress Notes (Signed)
   09/11/23 2015  Psych Admission Type (Psych Patients Only)  Admission Status Involuntary  Psychosocial Assessment  Patient Complaints Substance abuse;Anxiety;Depression  Eye Contact Fair  Facial Expression Flat  Affect Flat  Speech Soft  Interaction Assertive  Motor Activity Slow  Appearance/Hygiene In scrubs  Behavior Characteristics Cooperative  Mood Depressed;Pleasant  Thought Process  Coherency WDL  Content WDL  Delusions None reported or observed  Perception WDL  Hallucination None reported or observed  Judgment Impaired  Confusion None  Danger to Self  Current suicidal ideation? Denies  Agreement Not to Harm Self Yes  Description of Agreement verbal  Danger to Others  Danger to Others None reported or observed   Progress note   D: Pt seen in his room. States he has been sleepy today. Pt denies SI, HI, AVH. Pt rates pain  3/10 as a headache. Refused pain medication. Pt rates anxiety  6/10 and depression  6/10. Guarded but cooperative. Was able to contact [facility] and reschedule his home visit set for today. Pt endorses headache and anxiety as withdrawal symptoms. Was nauseous earlier in the day but says this has resolved. CIWA=3. CBG=189. No other concerns noted at this time.  A: Pt provided support and encouragement. Pt given scheduled medication as prescribed. PRNs as appropriate. Q15 min checks for safety.   R: Pt safe on the unit. Will continue to monitor.

## 2023-09-11 NOTE — Progress Notes (Signed)
   09/11/23 0722  Psych Admission Type (Psych Patients Only)  Admission Status Involuntary  Psychosocial Assessment  Patient Complaints None  Eye Contact Fair  Facial Expression Sullen  Affect Flat  Speech Soft  Interaction Assertive  Motor Activity Slow  Appearance/Hygiene In scrubs  Behavior Characteristics Cooperative  Mood Sullen  Thought Process  Coherency WDL  Content WDL  Delusions None reported or observed  Perception WDL  Hallucination None reported or observed  Judgment Impaired  Confusion None  Danger to Self  Current suicidal ideation? Denies  Danger to Others  Danger to Others None reported or observed

## 2023-09-11 NOTE — Progress Notes (Signed)
   09/10/23 2100  Psych Admission Type (Psych Patients Only)  Admission Status Involuntary  Psychosocial Assessment  Patient Complaints Anxiety;Depression;Substance abuse  Eye Contact Fair  Facial Expression Anxious  Affect Depressed  Speech Soft  Interaction Minimal  Motor Activity Slow  Appearance/Hygiene In scrubs  Behavior Characteristics Cooperative;Anxious  Mood Anxious;Pleasant  Thought Process  Coherency WDL  Content WDL  Delusions None reported or observed  Perception WDL  Hallucination None reported or observed  Judgment Impaired  Confusion None  Danger to Self  Current suicidal ideation? Denies  Agreement Not to Harm Self Yes  Description of Agreement verbal  Danger to Others  Danger to Others None reported or observed   Progress note   D: Pt seen in his room sitting on side of bed. Pt denies SI, HI, AVH. Pt rates pain  5/10 as a headache. Pt rates anxiety  7/10 and depression  6/10. Says earlier in the day he experienced diarrhea and nausea. Both have since resolved. Pt on detox protocol for alcohol use disorder. Pt concerned because he has a home visit scheduled for tomorrow with [facility] and needs to let them know that he is in the hospital. Encouraged pt to reach out to social worker in the morning. Will leave a sticky note for her. Also will look up number for pt to call himself to try and handle the situation. Pt grateful for the assistance. No other concerns noted at this time.  A: Pt provided support and encouragement. Pt given scheduled medication as prescribed. PRNs as appropriate. Q15 min checks for safety.   R: Pt safe on the unit. Will continue to monitor.

## 2023-09-11 NOTE — [facility] Suicide Risk Assessment (Signed)
 [facility] INPATIENT:  Family/Significant Other Suicide Prevention Education  Suicide Prevention Education:  Patient Refusal for Family/Significant Other Suicide Prevention Education: The patient Kyle [name] has refused to provide written consent for family/significant other to be provided Family/Significant Other Suicide Prevention Education during admission and/or prior to discharge.  Physician notified.    SPE completed with pt, as pt refused to consent to family contact. SPI pamphlet provided to pt and pt was encouraged to share information with support network, ask questions, and talk about any concerns relating to SPE. Pt denies access to guns/firearms and verbalized understanding of information provided. Mobile Crisis information also provided to pt.   MURLE BAKOWSKI 09/11/2023, 3:16 PM

## 2023-09-11 NOTE — [facility] Counselor (Signed)
 Adult Comprehensive Assessment  Patient ID: Kyle [name], male   DOB: [birth year], 63 y.o.   MRN: [id number]  Information Source: Information source: Patient  Current Stressors:  Patient states their primary concerns and needs for treatment are:: "drinking" Patient states their goals for this hospitilization and ongoing recovery are:: "quit drinking" Educational / Learning stressors: Pt denies. Employment / Job issues: Pt denies. Family Relationships: "my fiance just left me" Financial / Lack of resources (include bankruptcy): Pt denies. Housing / Lack of housing: Pt denies. Physical health (include injuries & life threatening diseases): "anemia, asthama, high blood pressure" Social relationships: Pt denies. Substance abuse: "alcohol" Bereavement / Loss: "been to a lot of funerals lately"  Living/Environment/Situation:  Living Arrangements: Alone Living conditions (as described by patient or guardian): WNL How long has patient lived in current situation?: "7 years" What is atmosphere in current home: Comfortable, Loving  Family History:  Marital status: Single Does patient have children?: No  Childhood History:  By whom was/is the patient raised?: Mother Description of patient's relationship with caregiver when they were a child: "we got along well" Patient's description of current relationship with people who raised him/her: Pt reports that mother is deceased. How were you disciplined when you got in trouble as a child/adolescent?: "I wasn't too much" Does patient have siblings?: Yes Number of Siblings: 7 Description of patient's current relationship with siblings: "estranged" Did patient suffer any verbal/emotional/physical/sexual abuse as a child?: No Did patient suffer from severe childhood neglect?: No Has patient ever been sexually abused/assaulted/raped as an adolescent or adult?: No Was the patient ever a victim of a crime or a disaster?: No Witnessed domestic  violence?: No Has patient been affected by domestic violence as an adult?: No  Education:  Highest grade of school patient has completed: "2 years of college" Currently a student?: No Learning disability?: Yes What learning problems does patient have?: "dyslexia"  Employment/Work Situation:   Employment Situation: On disability Why is Patient on Disability: "dyslexia" How Long has Patient Been on Disability: "2004" What is the Longest Time Patient has Held a Job?: "8 years" 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?: No  Financial Resources:   Surveyor, quantity resources: Writer (VA benefits) Does patient have a Lawyer or guardian?: No  Alcohol/Substance Abuse:   What has been your use of drugs/alcohol within the last 12 months?: Alcohol: "I try to buy the store out" Pt reports that he drinks quart of liquor daily and 12 beers If attempted suicide, did drugs/alcohol play a role in this?: No Alcohol/Substance Abuse Treatment Hx: Past Tx, Inpatient Has alcohol/substance abuse ever caused legal problems?: Yes (Pt reports that  he had a driving while license revoked.)  Social Support System:   Patient's Community Support System: None Describe Community Support System: Pt denies. Type of faith/religion: "Christian" How does patient's faith help to cope with current illness?: "go to church"  Leisure/Recreation:   Do You Have Hobbies?: Yes Leisure and Hobbies: "sports and going to church"  Strengths/Needs:   What is the patient's perception of their strengths?: "going to church" Patient states they can use these personal strengths during their treatment to contribute to their recovery: Pt denies. Patient states these barriers may  affect/interfere with their treatment: Pt denies. Patient states these  barriers may affect their return to the community: "being around people that don't have self-esteem"  Discharge Plan:   Currently receiving community mental health services: No Patient states concerns and preferences for aftercare planning are: Pt reports that he would like assistance in identifying a rehab service. Patient states they will know when they are safe and ready for discharge when: "that's on you" Does patient have access to transportation?: Yes Does patient have financial barriers related to discharge medications?: No Will patient be returning to same living situation after discharge?: Yes  Summary/Recommendations:   Summary and Recommendations (to be completed by the evaluator): Patient is a 63 year old male [facility] [place] [place] [facility]  He presents to the hospital for suicidal ideations.  Patient reports that he had plans to end his life.  He reports that he had a loaded gun near him when law enforcement arrived to his home. He reports that his current mental health state is triggered by his alcohol use.  He reports that he drinks 12 beers daily and a quart of liquor daily.  He reports that he does not have a current mental health provider, however, is open to a referral to see one at discharge.  He reports that he would like to go to a 30-day rehab program.  Recommendations include: crisis stabilization, therapeutic milieu, encourage group attendance and participation, medication management for mood stabilization and development of comprehensive mental wellness/sobriety plan.  [name]. 09/11/2023

## 2023-09-11 NOTE — Group Note (Signed)
 Recreation Therapy Group Note   Group Topic:Relaxation  Group Date: 09/11/2023 Start Time: 1100 End Time: 1140 Facilitators: Deatrice Factor, LRT, CTRS Location: Dayroom  Group Description: PMR (Progressive Muscle Relaxation). LRT educates patients on what PMR is and the benefits that come from it. Patients are asked to sit with their feet flat on the floor while sitting up and all the way back in their chair, if possible. LRT and pts follow a prompt through a speaker that requires you to tense and release different muscles in their body and focus on their breathing. During session, lights are off and soft music is being played. Pts are given a stress ball to use if needed.   Goal Area(s) Addressed:  Patients will be able to describe progressive muscle relaxation.  Patient will practice using relaxation technique. Patient will identify a new coping skill.  Patient will follow multistep directions to reduce anxiety and stress.   Affect/Mood: N/A   Participation Level: Did not attend    Clinical Observations/Individualized Feedback: Patient did not attend group.   Plan: Continue to engage patient in RT group sessions 2-3x/week.   Deatrice Factor, LRT, CTRS 09/11/2023 1:37 PM

## 2023-09-11 NOTE — Group Note (Signed)
 Recreation Therapy Group Note   Group Topic:Stress Management  Group Date: 09/11/2023 Start Time: 1500 End Time: 1550 Facilitators: Deatrice Factor, LRT, CTRS Location: Courtyard  Group Description: Outdoor Recreation. Patients had the option to play corn hole, ring toss, bowling or listening to music while outside in the courtyard getting fresh air and sunlight. Patients helped water and prune the raised garden beds. LRT and patients discussed things that they enjoy doing in their free time outside of the hospital. LRT encouraged patients to drink water after being active and getting their heart rate up.   Goal Area(s) Addressed: Patient will identify leisure interests.  Patient will practice healthy decision making. Patient will engage in recreation activity.   Affect/Mood: Flat   Participation Level: Minimal    Clinical Observations/Individualized Feedback: Kyle [name] was present for the last 5 minutes of group.   Plan: Continue to engage patient in RT group sessions 2-3x/week.   Deatrice Factor, LRT, CTRS 09/11/2023 5:09 PM

## 2023-09-11 NOTE — Group Note (Signed)
 Date:  09/11/2023 Time:  9:11 PM  Group Topic/Focus:  Making Healthy Choices:   The focus of this group is to help patients identify negative/unhealthy choices they were using prior to admission and identify positive/healthier coping strategies to replace them upon discharge.    Participation Level:  Active  Participation Quality:  Appropriate  Affect:  Appropriate  Cognitive:  Appropriate  Insight: Good  Engagement in Group:  Engaged  Modes of Intervention:  Discussion  Additional Comments:    Lynette Saras 09/11/2023, 9:11 PM

## 2023-09-11 NOTE — Plan of Care (Signed)
  Problem: Education: Goal: Emotional status will improve Outcome: Progressing Goal: Mental status will improve Outcome: Progressing Goal: Verbalization of understanding the information provided will improve Outcome: Progressing   Problem: Activity: Goal: Sleeping patterns will improve Outcome: Progressing   Problem: Coping: Goal: Ability to demonstrate self-control will improve Outcome: Progressing   Problem: Safety: Goal: Periods of time without injury will increase Outcome: Progressing

## 2023-09-12 DIAGNOSIS — F332 Major depressive disorder, recurrent severe without psychotic features: Secondary | ICD-10-CM | POA: Diagnosis not present

## 2023-09-12 LAB — CBG MONITORING, ED: Glucose-Capillary: 268 mg/dL — ABNORMAL HIGH (ref 70–99)

## 2023-09-12 LAB — GLUCOSE, CAPILLARY
Glucose-Capillary: 176 mg/dL — ABNORMAL HIGH (ref 70–99)
Glucose-Capillary: 227 mg/dL — ABNORMAL HIGH (ref 70–99)
Glucose-Capillary: 162 mg/dL — ABNORMAL HIGH (ref 70–99)

## 2023-09-12 MED ORDER — SERTRALINE HCL 50 MG PO TABS
50.0000 mg | ORAL_TABLET | Freq: Every day | ORAL | Status: DC
  Administered 2023-09-13 – 2023-09-16 (×4): 50 mg via ORAL
  Filled 2023-09-12 (×4): qty 1

## 2023-09-12 NOTE — Progress Notes (Signed)
   09/12/23 1400  Psych Admission Type (Psych Patients Only)  Admission Status Involuntary  Psychosocial Assessment  Patient Complaints Depression  Eye Contact Fair  Facial Expression Flat  Affect Flat  Speech Soft  Interaction Assertive  Motor Activity Slow  Appearance/Hygiene In scrubs  Behavior Characteristics Cooperative  Mood Depressed  Thought Process  Coherency WDL  Content WDL  Delusions None reported or observed  Perception WDL  Hallucination None reported or observed  Judgment Impaired  Confusion None  Danger to Self  Current suicidal ideation? Denies  Agreement Not to Harm Self Yes  Description of Agreement verbal  Danger to Others  Danger to Others None reported or observed

## 2023-09-12 NOTE — Progress Notes (Signed)
 [facility] MD Progress Note  09/12/2023 2:51 PM Kyle [name]  MRN:  [id number]  Kyle [name] is a 63 y.o. male admitted: Presented to the EDfor 09/09/2023  7:22 AM for increased depression and suicidal ideations, along with alcohol abuse. There are no documented previous  psychiatric diagnoses of   and has a past medical history of  DM2, HTN, DDD, CAD, etc.. His current presentation of increased anxiety, hopelessness, worthlessness and self harm thoughts  is most consistent with his current psychiatric/mental health concerns Patient is admitted to [facility] psych unit with Q15 min safety monitoring. Multidisciplinary team approach is offered. Medication management; group/milieu therapy is offered.  Subjective:  Chart reviewed, case discussed in multidisciplinary meeting, patient seen during rounds.  Patient is noted to be sleeping in his room.  He reports feeling tired but offers no other complaints.  He denies having SI/HI/intent/plan.  He reports some nausea but denies any tremors or diarrhea.  He denies hallucinations.  Per nursing report patient is taking his medications, participating in groups, getting along well with the peers.  During the progression rounds it was discussed with patient being a veteran and his post discharge planning of wanting to go to the rehab as to be directed through the [place].   Sleep: Fair  Appetite:  Fair  Past Psychiatric History: see h&P Family History:  Family History  Problem Relation Age of Onset   Heart disease Mother    Heart disease Father    Hypertension Father    Diabetes Sister    Cancer Sister        skin cancer   Diabetes Other    Heart disease Other    Social History:  Social History   Substance and Sexual Activity  Alcohol Use Yes   Alcohol/week: 6.0 standard drinks of alcohol   Types: 6 Cans of beer per week   Comment: pt binge drinking last 5 days - 15 to 20 beers daily     Social History   Substance and Sexual Activity  Drug Use Not  Currently    Social History   Socioeconomic History   Marital status: Married    Spouse name: Not on file   Number of children: Not on file   Years of education: 14   Highest education level: Not on file  Occupational History   Occupation: Warden/ranger Work    Comment: Disability  Tobacco Use   Smoking status: Every Day    Current packs/day: 0.33    Average packs/day: 0.3 packs/day for 35.0 years (11.6 ttl pk-yrs)    Types: Cigarettes   Smokeless tobacco: Never   Tobacco comments:    a pack lasts 2 to 4 days   Vaping Use   Vaping status: Never Used  Substance and Sexual Activity   Alcohol use: Yes    Alcohol/week: 6.0 standard drinks of alcohol    Types: 6 Cans of beer per week    Comment: pt binge drinking last 5 days - 15 to 20 beers daily   Drug use: Not Currently   Sexual activity: Yes    Partners: Female    Comment: Condoms sometimes  Other Topics Concern   Not on file  Social History Narrative   Pt states wife recently left him prompting him to binge drink.       Mr. Ponce grew up in the [place] area. He was in the [place]  [employer] for 4 years. 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 He is currently disabled. Mr. Reilley lives by himself but has excellent support system. He is very close to his family. He is currently taking online courses through [employer]. He is trying to obtain a degree in social services and ministry. His goal is to work with at-risk young adults.Regular exercise: not recentlyCaffeine use: occasionally   Social Drivers of Corporate investment banker Strain: Not on file  Food Insecurity: No Food Insecurity (09/09/2023)   Hunger Vital Sign    Worried About Running Out of Food in the Last Year: Never true    Ran Out of Food in the Last Year: Never true  Transportation Needs: No Transportation Needs (09/09/2023)   PRAPARE - Administrator, Civil Service  (Medical): No    Lack of Transportation (Non-Medical): No  Physical Activity: Not on file  Stress: Not on file  Social Connections: Not on file   Past Medical History:  Past Medical History:  Diagnosis Date   Allergy    Asthma    Chronic back pain 1981   a.followed by pain clinic and neurosurgery.   COPD (chronic obstructive pulmonary disease) (HCC)    Coronary artery disease involving native coronary artery    a. 10/2012 CABG x 4 @ [facility] (LIMA->LAD, VG->RI, VG->OM, VG->PDA);  b. 08/2014 Lexiscan  MV: low risk; c. 02/2019 MV: EF 53%, apical defect (atten vs scar), No ischemia-->low risk; d. 08/2022 MV: large area of inflat/inf ischemia and scar. Nl EF.   DDD (degenerative disc disease)    Diastolic dysfunction    a. 02/2019 Echo: EF 60-65%, mod LVH. Gr2 DD. Nl RV fxn. Unable to exclude bicuspid AoV; b. 04/2020 Echo: EF 50-55% anteroseptal HK, GrI DD, nl rV fxn, mild Ao sclerosis.   Dilated aortic root (HCC)    a. 02/2019 Echo: Ao root 44mm, Asc Ao 40mm, Ao arch 27mm; b. 04/2020 Echo: Ao root 45mm. Asc Ao 39mm; c. 06/2020 CTA Chest: No Ao aneurysm or dissection.   DM2 (diabetes mellitus, type 2) (HCC)    Fainting spell    GERD (gastroesophageal reflux disease)    Hepatitis C    2/2 tattoo   History of medication noncompliance    Hyperlipidemia with target LDL less than 70    Hypertension    Polysubstance abuse (HCC)    a. ongoing daily tobacco abuse and etoh (on the weekends), remote crack/cocaine abuse last used approximately in 2006   S/P CABG x 4    a. LIMA-LAD, SVG-Ramus, SVG-OM,SVG-dRCA   Urine incontinence     Past Surgical History:  Procedure Laterality Date   CARDIAC CATHETERIZATION  11/2012   [facility] - Multivessel CAD   CORONARY ARTERY BYPASS GRAFT  8/14   CABG x 4; LIMA-LAD, SVG-RI, SVG-OM1, SVG-rPDA (Atretic RIMA).   neck injection      Current Medications: Current Facility-Administered Medications  Medication Dose Route Frequency Provider Last  Rate Last Admin   acetaminophen  (TYLENOL ) tablet 650 mg  650 mg Oral Q6H PRN Dorthea Gauze, NP   650 mg at 09/10/23 2138   alum & mag hydroxide-simeth (MAALOX/MYLANTA) 200-200-20 MG/5ML suspension 30 mL  30 mL Oral Q4H PRN Dorthea Gauze, NP       chlordiazePOXIDE (LIBRIUM) capsule 25 mg  25 mg Oral Q6H PRN Clarance Bollard, MD       chlordiazePOXIDE (LIBRIUM) capsule 25 mg  25 mg Oral BH-qamhs Ethanael Veith, MD   25 mg at  09/12/23 0940   Followed by   Cecily Cohen ON 09/13/2023] chlordiazePOXIDE (LIBRIUM) capsule 25 mg  25 mg Oral Daily Nolberto Cheuvront, MD       cloNIDine (CATAPRES) tablet 0.1 mg  0.1 mg Oral BID Cassidy Tabet, MD   0.1 mg at 09/12/23 0941   feeding supplement (GLUCERNA SHAKE) (GLUCERNA SHAKE) liquid 237 mL  237 mL Oral TID BM Norina Cowper, MD   237 mL at 09/12/23 1355   hydrOXYzine  (ATARAX ) tablet 25 mg  25 mg Oral Q6H PRN Matvey Llanas, MD       insulin  aspart (novoLOG ) injection 0-15 Units  0-15 Units Subcutaneous TID WC Braydn Carneiro, MD   5 Units at 09/12/23 1140   insulin  aspart (novoLOG ) injection 0-5 Units  0-5 Units Subcutaneous QHS Stefania Goulart, MD   2 Units at 09/10/23 2133   insulin  glargine-yfgn (SEMGLEE) injection 15 Units  15 Units Subcutaneous Daily Ece Cumberland, MD   15 Units at 09/12/23 1610   loperamide (IMODIUM) capsule 2-4 mg  2-4 mg Oral PRN Ichael Pullara, MD       magnesium hydroxide (MILK OF MAGNESIA) suspension 30 mL  30 mL Oral Daily PRN Dorthea Gauze, NP       metoprolol  tartrate (LOPRESSOR ) tablet 37.5 mg  37.5 mg Oral BID Lulubelle Simcoe, MD   37.5 mg at 09/12/23 0940   multivitamin with minerals tablet 1 tablet  1 tablet Oral Daily Jessicca Stitzer, MD   1 tablet at 09/12/23 0940   OLANZapine (ZYPREXA) injection 5 mg  5 mg Intramuscular TID PRN Dorthea Gauze, NP       OLANZapine zydis (ZYPREXA) disintegrating tablet 5 mg  5 mg Oral TID PRN Dorthea Gauze, NP       ondansetron  (ZOFRAN -ODT) disintegrating tablet 4 mg  4 mg Oral Q6H PRN  Addelynn Batte, MD       sertraline (ZOLOFT) tablet 25 mg  25 mg Oral Daily Hamad Whyte, MD   25 mg at 09/12/23 9604    Lab Results:  Results for orders placed or performed during the hospital encounter of 09/09/23 (from the past 48 hours)  Glucose, capillary     Status: Abnormal   Collection Time: 09/10/23  4:41 PM  Result Value Ref Range   Glucose-Capillary 185 (H) 70 - 99 mg/dL    Comment: Glucose reference range applies only to samples taken after fasting for at least 8 hours.  Glucose, capillary     Status: Abnormal   Collection Time: 09/10/23  8:26 PM  Result Value Ref Range   Glucose-Capillary 224 (H) 70 - 99 mg/dL    Comment: Glucose reference range applies only to samples taken after fasting for at least 8 hours.   Comment 1 Notify RN    Comment 2 Document in Chart   Glucose, capillary     Status: Abnormal   Collection Time: 09/11/23  7:41 AM  Result Value Ref Range   Glucose-Capillary 171 (H) 70 - 99 mg/dL    Comment: Glucose reference range applies only to samples taken after fasting for at least 8 hours.  Glucose, capillary     Status: Abnormal   Collection Time: 09/11/23 11:34 AM  Result Value Ref Range   Glucose-Capillary 164 (H) 70 - 99 mg/dL    Comment: Glucose reference range applies only to samples taken after fasting for at least 8 hours.  Glucose, capillary     Status: Abnormal   Collection Time: 09/11/23  4:25 PM  Result Value Ref Range  Glucose-Capillary 172 (H) 70 - 99 mg/dL    Comment: Glucose reference range applies only to samples taken after fasting for at least 8 hours.  Glucose, capillary     Status: Abnormal   Collection Time: 09/11/23  7:58 PM  Result Value Ref Range   Glucose-Capillary 185 (H) 70 - 99 mg/dL    Comment: Glucose reference range applies only to samples taken after fasting for at least 8 hours.  Glucose, capillary     Status: Abnormal   Collection Time: 09/12/23  7:31 AM  Result Value Ref Range   Glucose-Capillary 176 (H) 70  - 99 mg/dL    Comment: Glucose reference range applies only to samples taken after fasting for at least 8 hours.  Glucose, capillary     Status: Abnormal   Collection Time: 09/12/23 11:35 AM  Result Value Ref Range   Glucose-Capillary 227 (H) 70 - 99 mg/dL    Comment: Glucose reference range applies only to samples taken after fasting for at least 8 hours.    Blood Alcohol level:  Lab Results  Component Value Date   ETH 191 (H) 09/09/2023    Metabolic Disorder Labs: Lab Results  Component Value Date   HGBA1C 9.7 (H) 09/09/2023   MPG 232 09/09/2023   No results found for: "PROLACTIN" Lab Results  Component Value Date   CHOL 156 06/08/2018   TRIG 212 (H) 06/08/2018   HDL 36 (L) 06/08/2018   CHOLHDL 4.3 06/08/2018   VLDL 16.1 (H) 11/24/2013   LDLCALC 78 06/08/2018   LDLCALC 78 08/14/2015    Physical Findings: AIMS:  , ,  ,  ,    CIWA:  CIWA-Ar Total: 0 COWS:      Psychiatric Specialty Exam:  Presentation  General Appearance:  Appropriate for Environment; Casual  Eye Contact: Fair  Speech: Clear and Coherent  Speech Volume: Normal    Mood and Affect  Mood: Depressed; Hopeless  Affect: Depressed; Flat   Thought Process  Thought Processes: Coherent  Descriptions of Associations:Intact  Orientation:Full (Time, Place and Person)  Thought Content:Logical  Hallucinations:Hallucinations: None  Ideas of Reference:None  Suicidal Thoughts:Suicidal Thoughts: No  Homicidal Thoughts:Homicidal Thoughts: No   Sensorium  Memory: Immediate Fair; Recent Fair; Remote Fair  Judgment: Impaired  Insight: Shallow   Executive Functions  Concentration: Fair  Attention Span: Fair  Recall: Fair  Fund of Knowledge: Fair  Language: Fair   Psychomotor Activity  Psychomotor Activity: Psychomotor Activity: Normal  Musculoskeletal: Strength & Muscle Tone: within normal limits Gait & Station: normal Assets  Assets: Investment banker, corporate; Desire for Improvement; Resilience    Physical Exam: Physical Exam Vitals and nursing note reviewed.  HENT:     Head: Normocephalic.     Nose: Nose normal.     Mouth/Throat:     Mouth: Mucous membranes are moist.  Cardiovascular:     Rate and Rhythm: Normal rate.  Pulmonary:     Effort: Pulmonary effort is normal.  Abdominal:     General: Bowel sounds are normal.  Skin:    General: Skin is warm.  Neurological:     Mental Status: He is alert.    Review of Systems  Constitutional: Negative.   HENT: Negative.    Eyes: Negative.   Cardiovascular: Negative.   Skin: Negative.    Blood pressure (!) 153/83, pulse 65, temperature (!) 97.2 F (36.2 C), resp. rate 18, height 5\' 8"  (1.727 m), weight 63.7 kg, SpO2 100%. Body mass index is 21.36 kg/m.  Diagnosis: Principal Problem:   MDD (major depressive disorder), recurrent severe, without psychosis (HCC)  Clinical Decision Making: Patient with hx of chronic alcoholism, depression and anxiety admitted for worsening depression, hopelessness in the context of recent breakup with girlfriend. He was holding a loaded gun when cops showed. It was reported that cops took his gun and pt will continue inpatient hospitalization for stabilization.   Treatment Plan Summary:   Safety and Monitoring:             -- Involuntary admission to inpatient psychiatric unit for safety, stabilization and treatment             -- Daily contact with patient to assess and evaluate symptoms and progress in treatment             -- Patient's case to be discussed in multi-disciplinary team meeting             -- Observation Level: q15 minute checks             -- Vital signs:  q12 hours             -- Precautions: suicide, elopement, and assault   2. Psychiatric Diagnoses and Treatment:                 zoloft 25 mg daily with plan to titrate it up to 50 mg in few days. Initiated CIWA protocol with librium taper   -- The  risks/benefits/side-effects/alternatives to this medication were discussed in detail with the patient and time was given for questions. The patient consents to medication trial.                -- Metabolic profile and EKG monitoring obtained while on an atypical antipsychotic (BMI: Lipid Panel: HbgA1c: QTc:)              -- Encouraged patient to participate in unit milieu and in scheduled group therapies                            3. Medical Issues Being Addressed:    Elevated blood pressure-reviewed the chart and patient takes metoprolol  37.5 mg twice daily.  Added to the current medication and also added clonidine 0.1 mg twice daily will continue to monitor blood pressure closely. 4. Discharge Planning:              -- Social work and case management to assist with discharge planning and identification of hospital follow-up needs prior to discharge             -- Estimated LOS: 5-7 days             -- Discharge Concerns: Need to establish a safety plan; Medication compliance and effectiveness             -- Discharge Goals: Return home with outpatient referrals follow ups   Aurelia Blotter, MD 09/12/2023, 2:51 PM

## 2023-09-12 NOTE — Group Note (Signed)
 Date:  09/12/2023 Time:  11:50 PM  Group Topic/Focus:  Developing a Wellness Toolbox:   The focus of this group is to help patients develop a "wellness toolbox" with skills and strategies to promote recovery upon discharge.    Participation Level:  Active  Participation Quality:  Appropriate  Affect:  Appropriate  Cognitive:  Appropriate  Insight: Appropriate  Engagement in Group:  Engaged  Modes of Intervention:  Education  Additional Comments:    Sherlie Distance 09/12/2023, 11:50 PM

## 2023-09-12 NOTE — Group Note (Signed)
 Date:  09/12/2023 Time:  3:02 PM  Group Topic/Focus:  Coping With Mental Health Crisis:   The purpose of this group is to help patients identify strategies for coping with mental health crisis.  Group discusses possible causes of crisis and ways to manage them effectively. Spirituality:   The focus of this group is to discuss how one's spirituality can aide in recovery.    Participation Level:  Active  Participation Quality:  Appropriate  Affect:  Appropriate  Cognitive:  Appropriate  Insight: Appropriate  Engagement in Group:  Engaged  Modes of Intervention:  Activity and Discussion  Additional Comments:    Saylor Sheckler L Shelita Steptoe 09/12/2023, 3:02 PM

## 2023-09-12 NOTE — Group Note (Signed)
 Physical/Occupational Therapy Group Note  Group Topic: UE Therex   Group Date: 09/12/2023 Start Time: 1300 End Time: 1345 Facilitators: Hilma Lucks, OT   Group Description: Group discussed impact of chronic/acute pain on safety and independence with functional tasks and impact on mental health.  Identified and discussed any previously learned or implemented strategies used.  Discussed and reviewed cognitive behavioral pain coping strategies to address/improve overall management of pain. Discussed relaxation, distraction techniques, cognitive restructuring, activity pacing/energy conservation, environment/home safety modifications, and role of sleep and sleep hygiene. Allowed time for questions and further discussion.  Therapeutic Goal(s):  Identify and discuss previously utilized pain coping strategies and implications of pain on function/well-being Identify and discuss implementing new cognitive behavioral pain coping strategies into daily routines Demonstrate understanding and performance of learned cognitive behavioral pain coping strategies  Individual Participation: Pt pleasant and engaged throughout majority of session. Pt interactive with and without prompting. He required min-mod multimodal cues to assist with UE therex technique for approx 50% of the exercises taught with fair carryover. A bit tired at the end of the session requiring VC to improve alertness.   Participation Level: Active and Engaged   Participation Quality: Minimal Cues and Moderate Cues   Behavior: Alert, Appropriate, Calm, and Interactive    Speech/Thought Process: Coherent and Relevant   Affect/Mood: Appropriate and Stable    Insight: Fair   Judgement: Fair   Modes of Intervention: Activity, Clarification, Discussion, Education, Problem-solving, and Socialization  Patient Response to Interventions:  Attentive, Engaged, Interested , and Receptive   Plan: Continue to engage patient in PT/OT groups 1 -  2x/week.  Keimani Laufer R., MPH, MS, OTR/L ascom [phone] 09/12/23, 2:37 PM

## 2023-09-12 NOTE — Plan of Care (Signed)
   Problem: Education: Goal: Emotional status will improve Outcome: Progressing Goal: Mental status will improve Outcome: Progressing

## 2023-09-12 NOTE — Plan of Care (Signed)
  Problem: Education: Goal: Emotional status will improve Outcome: Progressing Goal: Mental status will improve Outcome: Progressing   Problem: Activity: Goal: Interest or engagement in activities will improve Outcome: Progressing Goal: Sleeping patterns will improve Outcome: Progressing   Problem: Coping: Goal: Ability to demonstrate self-control will improve Outcome: Progressing   Problem: Physical Regulation: Goal: Ability to maintain clinical measurements within normal limits will improve Outcome: Progressing   Problem: Safety: Goal: Periods of time without injury will increase Outcome: Progressing

## 2023-09-12 NOTE — Group Note (Signed)
 Recreation Therapy Group Note   Group Topic:Leisure Education  Group Date: 09/12/2023 Start Time: 1500 End Time: 1600 Facilitators: Deatrice Factor, LRT, CTRS Location: Dayroom  Group Description: Bingo. LRT and patients played multiple games of Bingo with music playing in the background. LRT and pts discussed how this could be a leisure interest and the importance of doing things they enjoy post-discharge. Pts won stress balls or candy, as approved by Charity fundraiser, as their prizes.    Goal Area(s) Addressed: Patient will identify leisure interests.  Patient will practice healthy decision making. Patient will engage in recreation activity.  Patient will increase communication.   Affect/Mood: N/A   Participation Level: Did not attend    Clinical Observations/Individualized Feedback: Patient did not attend group.   Plan: Continue to engage patient in RT group sessions 2-3x/week.   Deatrice Factor, LRT, CTRS 09/12/2023 4:41 PM

## 2023-09-13 DIAGNOSIS — F332 Major depressive disorder, recurrent severe without psychotic features: Secondary | ICD-10-CM | POA: Diagnosis not present

## 2023-09-13 LAB — GLUCOSE, CAPILLARY
Glucose-Capillary: 229 mg/dL — ABNORMAL HIGH (ref 70–99)
Glucose-Capillary: 157 mg/dL — ABNORMAL HIGH (ref 70–99)
Glucose-Capillary: 214 mg/dL — ABNORMAL HIGH (ref 70–99)
Glucose-Capillary: 130 mg/dL — ABNORMAL HIGH (ref 70–99)

## 2023-09-13 NOTE — Progress Notes (Signed)
   09/13/23 1000  Psych Admission Type (Psych Patients Only)  Admission Status Involuntary  Psychosocial Assessment  Patient Complaints Depression  Eye Contact Fair  Facial Expression Flat  Affect Flat  Speech Soft  Interaction Assertive  Motor Activity Slow  Appearance/Hygiene In scrubs  Behavior Characteristics Cooperative  Mood Depressed  Thought Process  Coherency WDL  Content WDL  Delusions None reported or observed  Perception WDL  Hallucination None reported or observed  Judgment Impaired  Confusion None  Danger to Self  Current suicidal ideation? Denies  Agreement Not to Harm Self Yes  Description of Agreement verbal  Danger to Others  Danger to Others None reported or observed

## 2023-09-13 NOTE — Plan of Care (Signed)
 Patient alert and oriented x4. Denies SI, HI, AVH and pain. Support and encouragement provided.  Routine safety checks conducted every 15 minutes.  Patient informed to notify staff with problems or concerns. Patient verbally contracts for safety at this time. Patient interacts well with others on the unit.  Patient remains safe at this time.  Problem: Education: Goal: Knowledge of [facility] Education information/materials will improve Outcome: Progressing Goal: Emotional status will improve Outcome: Progressing Goal: Mental status will improve Outcome: Progressing Goal: Verbalization of understanding the information provided will improve Outcome: Progressing   Problem: Health Behavior/Discharge Planning: Goal: Identification of resources available to assist in meeting health care needs will improve Outcome: Progressing Goal: Compliance with treatment plan for underlying cause of condition will improve Outcome: Progressing   Problem: Safety: Goal: Periods of time without injury will increase Outcome: Progressing

## 2023-09-13 NOTE — Progress Notes (Signed)
   09/12/23 2125  Psych Admission Type (Psych Patients Only)  Admission Status Involuntary  Psychosocial Assessment  Patient Complaints Depression  Eye Contact Fair  Facial Expression Flat  Affect Flat  Speech Soft  Interaction Assertive  Motor Activity Slow  Appearance/Hygiene In scrubs  Behavior Characteristics Cooperative  Mood Depressed  Aggressive Behavior  Effect No apparent injury  Thought Process  Coherency WDL  Content WDL  Delusions None reported or observed  Perception WDL  Hallucination None reported or observed  Judgment Impaired  Confusion None  Danger to Self  Current suicidal ideation? Denies  Agreement Not to Harm Self Yes  Description of Agreement Verbal  Danger to Others  Danger to Others None reported or observed

## 2023-09-13 NOTE — Group Note (Signed)
 Date:  09/13/2023 Time:  10:17 AM  Group Topic/Focus:  Unit expectations and housekeeping goals.    Participation Level:  Did Not Attend    Merton Abts 09/13/2023, 10:17 AM

## 2023-09-13 NOTE — Progress Notes (Signed)
 [facility] MD Progress Note  09/13/2023 8:58 PM Kyle [name]  MRN:  [id number]  Kyle [name] is a 63 y.o. male admitted: Presented to the EDfor 09/09/2023  7:22 AM for increased depression and suicidal ideations, along with alcohol abuse. There are no documented previous  psychiatric diagnoses of   and has a past medical history of  DM2, HTN, DDD, CAD, etc.. His current presentation of increased anxiety, hopelessness, worthlessness and self harm thoughts  is most consistent with his current psychiatric/mental health concerns Patient is admitted to [facility] psych unit with Q15 min safety monitoring. Multidisciplinary team approach is offered. Medication management; group/milieu therapy is offered.  Subjective:  Chart reviewed, case discussed in multidisciplinary meeting, patient seen during rounds.  Today on interview patient is noted to be resting in bed.  Per nursing report patient has no behavioral problems and most of the time he is sleeping in his room.  Provider educated him about getting connected to the rehab program through [place].  He expresses understanding.  He denies SI/HI/plan.  He denies auditory/visual hallucinations.  He denies having any tremors/nausea/vomiting and he is eating all his meals. Sleep: Fair  Appetite:  Fair  Past Psychiatric History: see h&P Family History:  Family History  Problem Relation Age of Onset   Heart disease Mother    Heart disease Father    Hypertension Father    Diabetes Sister    Cancer Sister        skin cancer   Diabetes Other    Heart disease Other    Social History:  Social History   Substance and Sexual Activity  Alcohol Use Yes   Alcohol/week: 6.0 standard drinks of alcohol   Types: 6 Cans of beer per week   Comment: pt binge drinking last 5 days - 15 to 20 beers daily     Social History   Substance and Sexual Activity  Drug Use Not Currently    Social History   Socioeconomic History   Marital status: Married    Spouse name: Not on  file   Number of children: Not on file   Years of education: 14   Highest education level: Not on file  Occupational History   Occupation: Warden/ranger Work    Comment: Disability  Tobacco Use   Smoking status: Every Day    Current packs/day: 0.33    Average packs/day: 0.3 packs/day for 35.0 years (11.6 ttl pk-yrs)    Types: Cigarettes   Smokeless tobacco: Never   Tobacco comments:    a pack lasts 2 to 4 days   Vaping Use   Vaping status: Never Used  Substance and Sexual Activity   Alcohol use: Yes    Alcohol/week: 6.0 standard drinks of alcohol    Types: 6 Cans of beer per week    Comment: pt binge drinking last 5 days - 15 to 20 beers daily   Drug use: Not Currently   Sexual activity: Yes    Partners: Female    Comment: Condoms sometimes  Other Topics Concern   Not on file  Social History Narrative   Pt states wife recently left him prompting him to binge drink.       Mr. Kearse grew up in the [place] area. He was in the [place]  [employer] for 4 years. 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 He is currently disabled. Mr. Midgley lives by himself but has excellent support  system. He is very close to his family. He is currently taking online courses through [employer]. He is trying to obtain a degree in social services and ministry. His goal is to work with at-risk young adults.Regular exercise: not recentlyCaffeine use: occasionally   Social Drivers of Corporate investment banker Strain: Not on file  Food Insecurity: No Food Insecurity (09/09/2023)   Hunger Vital Sign    Worried About Running Out of Food in the Last Year: Never true    Ran Out of Food in the Last Year: Never true  Transportation Needs: No Transportation Needs (09/09/2023)   PRAPARE - Administrator, Civil Service (Medical): No    Lack of Transportation (Non-Medical): No  Physical Activity: Not on file  Stress: Not on file   Social Connections: Not on file   Past Medical History:  Past Medical History:  Diagnosis Date   Allergy    Asthma    Chronic back pain 1981   a.followed by pain clinic and neurosurgery.   COPD (chronic obstructive pulmonary disease) (HCC)    Coronary artery disease involving native coronary artery    a. 10/2012 CABG x 4 @ [facility] (LIMA->LAD, VG->RI, VG->OM, VG->PDA);  b. 08/2014 Lexiscan  MV: low risk; c. 02/2019 MV: EF 53%, apical defect (atten vs scar), No ischemia-->low risk; d. 08/2022 MV: large area of inflat/inf ischemia and scar. Nl EF.   DDD (degenerative disc disease)    Diastolic dysfunction    a. 02/2019 Echo: EF 60-65%, mod LVH. Gr2 DD. Nl RV fxn. Unable to exclude bicuspid AoV; b. 04/2020 Echo: EF 50-55% anteroseptal HK, GrI DD, nl rV fxn, mild Ao sclerosis.   Dilated aortic root (HCC)    a. 02/2019 Echo: Ao root 44mm, Asc Ao 40mm, Ao arch 27mm; b. 04/2020 Echo: Ao root 45mm. Asc Ao 39mm; c. 06/2020 CTA Chest: No Ao aneurysm or dissection.   DM2 (diabetes mellitus, type 2) (HCC)    Fainting spell    GERD (gastroesophageal reflux disease)    Hepatitis C    2/2 tattoo   History of medication noncompliance    Hyperlipidemia with target LDL less than 70    Hypertension    Polysubstance abuse (HCC)    a. ongoing daily tobacco abuse and etoh (on the weekends), remote crack/cocaine abuse last used approximately in 2006   S/P CABG x 4    a. LIMA-LAD, SVG-Ramus, SVG-OM,SVG-dRCA   Urine incontinence     Past Surgical History:  Procedure Laterality Date   CARDIAC CATHETERIZATION  11/2012   [facility] - Multivessel CAD   CORONARY ARTERY BYPASS GRAFT  8/14   CABG x 4; LIMA-LAD, SVG-RI, SVG-OM1, SVG-rPDA (Atretic RIMA).   neck injection      Current Medications: Current Facility-Administered Medications  Medication Dose Route Frequency Provider Last Rate Last Admin   acetaminophen  (TYLENOL ) tablet 650 mg  650 mg Oral Q6H PRN Dorthea Gauze, NP   650 mg at  09/10/23 2138   alum & mag hydroxide-simeth (MAALOX/MYLANTA) 200-200-20 MG/5ML suspension 30 mL  30 mL Oral Q4H PRN Dorthea Gauze, NP       cloNIDine  (CATAPRES ) tablet 0.1 mg  0.1 mg Oral BID Fran Neiswonger, MD   0.1 mg at 09/13/23 1005   feeding supplement (GLUCERNA SHAKE) (GLUCERNA SHAKE) liquid 237 mL  237 mL Oral TID BM Cyndi Montejano, MD   237 mL at 09/13/23 1409   insulin  aspart (novoLOG ) injection 0-15 Units  0-15 Units Subcutaneous  TID WC Kashara Blocher, MD   5 Units at 09/13/23 1653   insulin  aspart (novoLOG ) injection 0-5 Units  0-5 Units Subcutaneous QHS Reiley Bertagnolli, MD   2 Units at 09/10/23 2133   insulin  glargine-yfgn (SEMGLEE ) injection 15 Units  15 Units Subcutaneous Daily Leanndra Pember, MD   15 Units at 09/13/23 1359   magnesium  hydroxide (MILK OF MAGNESIA) suspension 30 mL  30 mL Oral Daily PRN Dorthea Gauze, NP       metoprolol  tartrate (LOPRESSOR ) tablet 37.5 mg  37.5 mg Oral BID Jori Frerichs, MD   37.5 mg at 09/13/23 1006   multivitamin with minerals tablet 1 tablet  1 tablet Oral Daily Marquiz Sotelo, MD   1 tablet at 09/13/23 1006   OLANZapine  (ZYPREXA ) injection 5 mg  5 mg Intramuscular TID PRN Dorthea Gauze, NP       OLANZapine  zydis (ZYPREXA ) disintegrating tablet 5 mg  5 mg Oral TID PRN Dorthea Gauze, NP       sertraline  (ZOLOFT ) tablet 50 mg  50 mg Oral Daily Saadia Dewitt, MD   50 mg at 09/13/23 1005    Lab Results:  Results for orders placed or performed during the hospital encounter of 09/09/23 (from the past 48 hours)  Glucose, capillary     Status: Abnormal   Collection Time: 09/12/23  7:31 AM  Result Value Ref Range   Glucose-Capillary 176 (H) 70 - 99 mg/dL    Comment: Glucose reference range applies only to samples taken after fasting for at least 8 hours.  Glucose, capillary     Status: Abnormal   Collection Time: 09/12/23 11:35 AM  Result Value Ref Range   Glucose-Capillary 227 (H) 70 - 99 mg/dL    Comment: Glucose reference range applies  only to samples taken after fasting for at least 8 hours.  Glucose, capillary     Status: Abnormal   Collection Time: 09/12/23  7:54 PM  Result Value Ref Range   Glucose-Capillary 162 (H) 70 - 99 mg/dL    Comment: Glucose reference range applies only to samples taken after fasting for at least 8 hours.  Glucose, capillary     Status: Abnormal   Collection Time: 09/13/23  7:40 AM  Result Value Ref Range   Glucose-Capillary 229 (H) 70 - 99 mg/dL    Comment: Glucose reference range applies only to samples taken after fasting for at least 8 hours.  Glucose, capillary     Status: Abnormal   Collection Time: 09/13/23 11:38 AM  Result Value Ref Range   Glucose-Capillary 157 (H) 70 - 99 mg/dL    Comment: Glucose reference range applies only to samples taken after fasting for at least 8 hours.  Glucose, capillary     Status: Abnormal   Collection Time: 09/13/23  4:19 PM  Result Value Ref Range   Glucose-Capillary 214 (H) 70 - 99 mg/dL    Comment: Glucose reference range applies only to samples taken after fasting for at least 8 hours.    Blood Alcohol level:  Lab Results  Component Value Date   ETH 191 (H) 09/09/2023    Metabolic Disorder Labs: Lab Results  Component Value Date   HGBA1C 9.7 (H) 09/09/2023   MPG 232 09/09/2023   No results found for: "PROLACTIN" Lab Results  Component Value Date   CHOL 156 06/08/2018   TRIG 212 (H) 06/08/2018   HDL 36 (L) 06/08/2018   CHOLHDL 4.3 06/08/2018   VLDL 16.1 (H) 11/24/2013   LDLCALC 78  06/08/2018   LDLCALC 78 08/14/2015    Physical Findings: AIMS:  , ,  ,  ,    CIWA:  CIWA-Ar Total: 0 COWS:      Psychiatric Specialty Exam:  Presentation  General Appearance:  Appropriate for Environment; Casual  Eye Contact: Fair  Speech: Clear and Coherent  Speech Volume: Normal    Mood and Affect  Mood: good Affect: euthymic  Thought Process  Thought Processes: Coherent  Descriptions of  Associations:Intact  Orientation:Full (Time, Place and Person)  Thought Content:Logical  Hallucinations:denies  Ideas of Reference:None  Suicidal Thoughts:denies  Homicidal Thoughts:denies   Sensorium  Memory: Immediate Fair; Recent Fair; Remote Fair  Judgment: Impaired  Insight: Shallow   Executive Functions  Concentration: Fair  Attention Span: Fair  Recall: [employer] of Knowledge: Fair  Language: Fair   Psychomotor Activity  Psychomotor Activity: No data recorded  Musculoskeletal: Strength & Muscle Tone: within normal limits Gait & Station: normal Assets  Assets: Manufacturing systems engineer; Desire for Improvement; Resilience    Physical Exam: Physical Exam Vitals and nursing note reviewed.  HENT:     Head: Normocephalic.     Nose: Nose normal.     Mouth/Throat:     Mouth: Mucous membranes are moist.  Cardiovascular:     Rate and Rhythm: Normal rate.  Pulmonary:     Effort: Pulmonary effort is normal.  Abdominal:     General: Bowel sounds are normal.  Skin:    General: Skin is warm.  Neurological:     Mental Status: He is alert.    Review of Systems  Constitutional: Negative.   HENT: Negative.    Eyes: Negative.   Cardiovascular: Negative.   Skin: Negative.    Blood pressure (!) 157/83, pulse 69, temperature 98.3 F (36.8 C), temperature source Oral, resp. rate 18, height 5\' 8"  (1.727 m), weight 63.7 kg, SpO2 98%. Body mass index is 21.36 kg/m.  Diagnosis: Principal Problem:   MDD (major depressive disorder), recurrent severe, without psychosis (HCC)  Clinical Decision Making: Patient with hx of chronic alcoholism, depression and anxiety admitted for worsening depression, hopelessness in the context of recent breakup with girlfriend. He was holding a loaded gun when cops showed. It was reported that cops took his gun and pt will continue inpatient hospitalization for stabilization.   Treatment Plan Summary:   Safety and  Monitoring:             -- Involuntary admission to inpatient psychiatric unit for safety, stabilization and treatment             -- Daily contact with patient to assess and evaluate symptoms and progress in treatment             -- Patient's case to be discussed in multi-disciplinary team meeting             -- Observation Level: q15 minute checks             -- Vital signs:  q12 hours             -- Precautions: suicide, elopement, and assault   2. Psychiatric Diagnoses and Treatment:                 zoloft  50 mg daily CIWA protocol with librium  taper. Added clonidine  for poorly controlled BP   -- The risks/benefits/side-effects/alternatives to this medication were discussed in detail with the patient and time was given for questions. The patient consents to medication trial.                --  Metabolic profile and EKG monitoring obtained while on an atypical antipsychotic (BMI: Lipid Panel: HbgA1c: QTc:)              -- Encouraged patient to participate in unit milieu and in scheduled group therapies                            3. Medical Issues Being Addressed:    Elevated blood pressure-reviewed the chart and patient takes metoprolol  37.5 mg twice daily.  Added to the current medication and also added clonidine  0.1 mg twice daily will continue to monitor blood pressure closely. 4. Discharge Planning:              -- Social work and case management to assist with discharge planning and identification of hospital follow-up needs prior to discharge             -- Estimated LOS: 5-7 days             -- Discharge Concerns: Need to establish a safety plan; Medication compliance and effectiveness             -- Discharge Goals: Return home with outpatient referrals follow ups   Aurelia Blotter, MD 09/13/2023, 8:58 PM

## 2023-09-13 NOTE — Plan of Care (Signed)
   Problem: Education: Goal: Emotional status will improve Outcome: Progressing Goal: Mental status will improve Outcome: Progressing

## 2023-09-14 DIAGNOSIS — F332 Major depressive disorder, recurrent severe without psychotic features: Secondary | ICD-10-CM | POA: Diagnosis not present

## 2023-09-14 LAB — GLUCOSE, CAPILLARY
Glucose-Capillary: 237 mg/dL — ABNORMAL HIGH (ref 70–99)
Glucose-Capillary: 129 mg/dL — ABNORMAL HIGH (ref 70–99)
Glucose-Capillary: 227 mg/dL — ABNORMAL HIGH (ref 70–99)
Glucose-Capillary: 192 mg/dL — ABNORMAL HIGH (ref 70–99)

## 2023-09-14 NOTE — Progress Notes (Signed)
   09/14/23 0600  15 Minute Checks  Location Bedroom  Visual Appearance Calm  Behavior Sleeping  Sleep (Behavioral Health Patients Only)  Calculate sleep? (Click Yes once per 24 hr at 0600 safety check) Yes  Documented sleep last 24 hours 14.5

## 2023-09-14 NOTE — Progress Notes (Signed)
   09/14/23 2100  Psych Admission Type (Psych Patients Only)  Admission Status Involuntary  Psychosocial Assessment  Patient Complaints None  Eye Contact Fair  Facial Expression Flat  Affect Flat  Speech Soft  Interaction Assertive  Motor Activity Slow  Appearance/Hygiene In scrubs  Behavior Characteristics Cooperative  Mood Pleasant  Aggressive Behavior  Effect No apparent injury  Thought Process  Coherency WDL  Content WDL  Delusions None reported or observed  Perception WDL  Hallucination None reported or observed  Judgment Impaired  Confusion None  Danger to Self  Current suicidal ideation? Denies  Agreement Not to Harm Self Yes  Description of Agreement verbal  Danger to Others  Danger to Others None reported or observed

## 2023-09-14 NOTE — Plan of Care (Signed)
   Problem: Education: Goal: Knowledge of [facility] Education information/materials will improve Outcome: Progressing Goal: Verbalization of understanding the information provided will improve Outcome: Progressing

## 2023-09-14 NOTE — Group Note (Signed)
 Date:  09/14/2023 Time:  12:37 AM  Group Topic/Focus:  Wrap-Up Group:   The focus of this group is to help patients review their daily goal of treatment and discuss progress on daily workbooks.    Participation Level:  Active  Participation Quality:  Appropriate and Attentive  Affect:  Excited  Cognitive:  Alert  Insight: Appropriate  Engagement in Group:  Engaged  Modes of Intervention:  Discussion  Additional Comments:     Maglione,Kyle [name] 09/14/2023, 12:37 AM

## 2023-09-14 NOTE — Progress Notes (Signed)
 [facility] MD Progress Note  09/14/2023 5:37 PM Kyle [name]  MRN:  [id number]  Kyle [name] is a 63 y.o. male admitted: Presented to the EDfor 09/09/2023  7:22 AM for increased depression and suicidal ideations, along with alcohol abuse. There are no documented previous  psychiatric diagnoses of   and has a past medical history of  DM2, HTN, DDD, CAD, etc.. His current presentation of increased anxiety, hopelessness, worthlessness and self harm thoughts  is most consistent with his current psychiatric/mental health concerns Patient is admitted to [facility] psych unit with Q15 min safety monitoring. Multidisciplinary team approach is offered. Medication management; group/milieu therapy is offered.  Subjective:  Chart reviewed, case discussed in multidisciplinary meeting, patient seen during rounds.  Patient remains in bed resting.  Provider encouraged patient to get out of the room and sit in the day room times.  Patient reports feeling sleepy.  He denies SI/HI/plan and denies hallucinations.  He is taking his medications with no reported side effects.  He has fair appetite and sleep.  He is not displaying any signs of withdrawal at this time.  Provider continued education patient that he needs to go through [place] to get access to the inpatient substance use programs.  Patient expressed his understanding   Sleep: Fair  Appetite:  Fair  Past Psychiatric History: see h&P Family History:  Family History  Problem Relation Age of Onset   Heart disease Mother    Heart disease Father    Hypertension Father    Diabetes Sister    Cancer Sister        skin cancer   Diabetes Other    Heart disease Other    Social History:  Social History   Substance and Sexual Activity  Alcohol Use Yes   Alcohol/week: 6.0 standard drinks of alcohol   Types: 6 Cans of beer per week   Comment: pt binge drinking last 5 days - 15 to 20 beers daily     Social History   Substance and Sexual Activity  Drug Use Not Currently     Social History   Socioeconomic History   Marital status: Married    Spouse name: Not on file   Number of children: Not on file   Years of education: 14   Highest education level: Not on file  Occupational History   Occupation: Warden/ranger Work    Comment: Disability  Tobacco Use   Smoking status: Every Day    Current packs/day: 0.33    Average packs/day: 0.3 packs/day for 35.0 years (11.6 ttl pk-yrs)    Types: Cigarettes   Smokeless tobacco: Never   Tobacco comments:    a pack lasts 2 to 4 days   Vaping Use   Vaping status: Never Used  Substance and Sexual Activity   Alcohol use: Yes    Alcohol/week: 6.0 standard drinks of alcohol    Types: 6 Cans of beer per week    Comment: pt binge drinking last 5 days - 15 to 20 beers daily   Drug use: Not Currently   Sexual activity: Yes    Partners: Female    Comment: Condoms sometimes  Other Topics Concern   Not on file  Social History Narrative   Pt states wife recently left him prompting him to binge drink.       Mr. Kyle [name] grew up in the [place] area. He was in [place]  [employer] for 4 years. 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 He is currently disabled. Mr. Kyle [name] lives by himself but has excellent support system. He is very close to his family. He is currently taking online courses through [employer]. He is trying to obtain a degree in social services and ministry. His goal is to work with at-risk young adults.Regular exercise: not recentlyCaffeine use: occasionally   Social Drivers of Corporate investment banker Strain: Not on file  Food Insecurity: No Food Insecurity (09/09/2023)   Hunger Vital Sign    Worried About Running Out of Food in the Last Year: Never true    Ran Out of Food in the Last Year: Never true  Transportation Needs: No Transportation Needs (09/09/2023)   PRAPARE - Administrator, Civil Service (Medical): No     Lack of Transportation (Non-Medical): No  Physical Activity: Not on file  Stress: Not on file  Social Connections: Not on file   Past Medical History:  Past Medical History:  Diagnosis Date   Allergy    Asthma    Chronic back pain 1981   a.followed by pain clinic and neurosurgery.   COPD (chronic obstructive pulmonary disease) (HCC)    Coronary artery disease involving native coronary artery    a. 10/2012 CABG x 4 @ [facility] (LIMA->LAD, VG->RI, VG->OM, VG->PDA);  b. 08/2014 Lexiscan  MV: low risk; c. 02/2019 MV: EF 53%, apical defect (atten vs scar), No ischemia-->low risk; d. 08/2022 MV: large area of inflat/inf ischemia and scar. Nl EF.   DDD (degenerative disc disease)    Diastolic dysfunction    a. 02/2019 Echo: EF 60-65%, mod LVH. Gr2 DD. Nl RV fxn. Unable to exclude bicuspid AoV; b. 04/2020 Echo: EF 50-55% anteroseptal HK, GrI DD, nl rV fxn, mild Ao sclerosis.   Dilated aortic root (HCC)    a. 02/2019 Echo: Ao root 44mm, Asc Ao 40mm, Ao arch 27mm; b. 04/2020 Echo: Ao root 45mm. Asc Ao 39mm; c. 06/2020 CTA Chest: No Ao aneurysm or dissection.   DM2 (diabetes mellitus, type 2) (HCC)    Fainting spell    GERD (gastroesophageal reflux disease)    Hepatitis C    2/2 tattoo   History of medication noncompliance    Hyperlipidemia with target LDL less than 70    Hypertension    Polysubstance abuse (HCC)    a. ongoing daily tobacco abuse and etoh (on the weekends), remote crack/cocaine abuse last used approximately in 2006   S/P CABG x 4    a. LIMA-LAD, SVG-Ramus, SVG-OM,SVG-dRCA   Urine incontinence     Past Surgical History:  Procedure Laterality Date   CARDIAC CATHETERIZATION  11/2012   [facility] - Multivessel CAD   CORONARY ARTERY BYPASS GRAFT  8/14   CABG x 4; LIMA-LAD, SVG-RI, SVG-OM1, SVG-rPDA (Atretic RIMA).   neck injection      Current Medications: Current Facility-Administered Medications  Medication Dose Route Frequency Provider Last Rate Last Admin    acetaminophen  (TYLENOL ) tablet 650 mg  650 mg Oral Q6H PRN Dorthea Gauze, NP   650 mg at 09/10/23 2138   alum & mag hydroxide-simeth (MAALOX/MYLANTA) 200-200-20 MG/5ML suspension 30 mL  30 mL Oral Q4H PRN Dorthea Gauze, NP       cloNIDine  (CATAPRES ) tablet 0.1 mg  0.1 mg Oral BID Travius Crochet, MD   0.1 mg at 09/14/23 1018   feeding supplement (GLUCERNA SHAKE) (GLUCERNA SHAKE) liquid 237 mL  237 mL Oral TID BM  Kalven Ganim, MD   237 mL at 09/14/23 1458   insulin  aspart (novoLOG ) injection 0-15 Units  0-15 Units Subcutaneous TID WC Redmond Whittley, MD   5 Units at 09/14/23 1728   insulin  aspart (novoLOG ) injection 0-5 Units  0-5 Units Subcutaneous QHS Dutch Ing, MD   2 Units at 09/10/23 2133   insulin  glargine-yfgn (SEMGLEE ) injection 15 Units  15 Units Subcutaneous Daily Marliyah Reid, MD   15 Units at 09/14/23 1017   magnesium  hydroxide (MILK OF MAGNESIA) suspension 30 mL  30 mL Oral Daily PRN Dorthea Gauze, NP       metoprolol  tartrate (LOPRESSOR ) tablet 37.5 mg  37.5 mg Oral BID Loletha Bertini, MD   37.5 mg at 09/14/23 1018   multivitamin with minerals tablet 1 tablet  1 tablet Oral Daily Brittiny Levitz, MD   1 tablet at 09/14/23 1018   OLANZapine  (ZYPREXA ) injection 5 mg  5 mg Intramuscular TID PRN Dorthea Gauze, NP       OLANZapine  zydis (ZYPREXA ) disintegrating tablet 5 mg  5 mg Oral TID PRN Dorthea Gauze, NP       sertraline  (ZOLOFT ) tablet 50 mg  50 mg Oral Daily Logan Vegh, MD   50 mg at 09/14/23 1018    Lab Results:  Results for orders placed or performed during the hospital encounter of 09/09/23 (from the past 48 hours)  Glucose, capillary     Status: Abnormal   Collection Time: 09/12/23  7:54 PM  Result Value Ref Range   Glucose-Capillary 162 (H) 70 - 99 mg/dL    Comment: Glucose reference range applies only to samples taken after fasting for at least 8 hours.  Glucose, capillary     Status: Abnormal   Collection Time: 09/13/23  7:40 AM  Result Value Ref Range    Glucose-Capillary 229 (H) 70 - 99 mg/dL    Comment: Glucose reference range applies only to samples taken after fasting for at least 8 hours.  Glucose, capillary     Status: Abnormal   Collection Time: 09/13/23 11:38 AM  Result Value Ref Range   Glucose-Capillary 157 (H) 70 - 99 mg/dL    Comment: Glucose reference range applies only to samples taken after fasting for at least 8 hours.  Glucose, capillary     Status: Abnormal   Collection Time: 09/13/23  4:19 PM  Result Value Ref Range   Glucose-Capillary 214 (H) 70 - 99 mg/dL    Comment: Glucose reference range applies only to samples taken after fasting for at least 8 hours.  Glucose, capillary     Status: Abnormal   Collection Time: 09/13/23  9:01 PM  Result Value Ref Range   Glucose-Capillary 130 (H) 70 - 99 mg/dL    Comment: Glucose reference range applies only to samples taken after fasting for at least 8 hours.  Glucose, capillary     Status: Abnormal   Collection Time: 09/14/23  7:30 AM  Result Value Ref Range   Glucose-Capillary 237 (H) 70 - 99 mg/dL    Comment: Glucose reference range applies only to samples taken after fasting for at least 8 hours.  Glucose, capillary     Status: Abnormal   Collection Time: 09/14/23 11:43 AM  Result Value Ref Range   Glucose-Capillary 129 (H) 70 - 99 mg/dL    Comment: Glucose reference range applies only to samples taken after fasting for at least 8 hours.  Glucose, capillary     Status: Abnormal   Collection Time: 09/14/23  4:27 PM  Result Value Ref Range   Glucose-Capillary 227 (H) 70 - 99 mg/dL    Comment: Glucose reference range applies only to samples taken after fasting for at least 8 hours.    Blood Alcohol level:  Lab Results  Component Value Date   ETH 191 (H) 09/09/2023    Metabolic Disorder Labs: Lab Results  Component Value Date   HGBA1C 9.7 (H) 09/09/2023   MPG 232 09/09/2023   No results found for: "PROLACTIN" Lab Results  Component Value Date   CHOL 156  06/08/2018   TRIG 212 (H) 06/08/2018   HDL 36 (L) 06/08/2018   CHOLHDL 4.3 06/08/2018   VLDL 16.1 (H) 11/24/2013   LDLCALC 78 06/08/2018   LDLCALC 78 08/14/2015    Physical Findings: AIMS:  , ,  ,  ,    CIWA:  CIWA-Ar Total: 0 COWS:      Psychiatric Specialty Exam:  Presentation  General Appearance:  Appropriate for Environment; Casual  Eye Contact: Fair  Speech: Clear and Coherent  Speech Volume: Normal    Mood and Affect  Mood: good Affect: euthymic  Thought Process  Thought Processes: Coherent  Descriptions of Associations:Intact  Orientation:Full (Time, Place and Person)  Thought Content:Logical  Hallucinations:denies  Ideas of Reference:None  Suicidal Thoughts:denies  Homicidal Thoughts:denies   Sensorium  Memory: Immediate Fair; Recent Fair; Remote Fair  Judgment: Impaired  Insight: Shallow   Executive Functions  Concentration: Fair  Attention Span: Fair  Recall: [employer] of Knowledge: Fair  Language: Fair   Psychomotor Activity  Psychomotor Activity: No data recorded  Musculoskeletal: Strength & Muscle Tone: within normal limits Gait & Station: normal Assets  Assets: Manufacturing systems engineer; Desire for Improvement; Resilience    Physical Exam: Physical Exam Vitals and nursing note reviewed.  HENT:     Head: Normocephalic.     Nose: Nose normal.     Mouth/Throat:     Mouth: Mucous membranes are moist.  Cardiovascular:     Rate and Rhythm: Normal rate.  Pulmonary:     [place]: Pulmonary effort is normal.  Abdominal:     General: Bowel sounds are normal.  Skin:    General: Skin is warm.  Neurological:     Mental Status: He is alert.    Review of Systems  Constitutional: Negative.   HENT: Negative.    Eyes: Negative.   Cardiovascular: Negative.   Skin: Negative.    Blood pressure (!) 146/89, pulse 61, temperature (!) 97.2 F (36.2 C), resp. rate 20, height 5\' 8"  (1.727 m), weight 63.7 kg,  SpO2 100%. Body mass index is 21.36 kg/m.  Diagnosis: Principal Problem:   MDD (major depressive disorder), recurrent severe, without psychosis (HCC)  Clinical Decision Making: Patient with hx of chronic alcoholism, depression and anxiety admitted for worsening depression, hopelessness in the context of recent breakup with girlfriend. He was holding a loaded gun when cops showed. It was reported that cops took his gun and pt will continue inpatient hospitalization for stabilization.   Treatment Plan Summary:   Safety and Monitoring:             -- Involuntary admission to inpatient psychiatric unit for safety, stabilization and treatment             -- Daily contact with patient to assess and evaluate symptoms and progress in treatment             -- Patient's case to be discussed in multi-disciplinary team meeting             --  Observation Level: q15 minute checks             -- Vital signs:  q12 hours             -- Precautions: suicide, elopement, and assault   2. Psychiatric Diagnoses and Treatment:                 zoloft  50 mg daily CIWA protocol with librium  taper completed. Added clonidine  for poorly controlled BP   -- The risks/benefits/side-effects/alternatives to this medication were discussed in detail with the patient and time was given for questions. The patient consents to medication trial.                -- Metabolic profile and EKG monitoring obtained while on an atypical antipsychotic (BMI: Lipid Panel: HbgA1c: QTc:)              -- Encouraged patient to participate in unit milieu and in scheduled group therapies                            3. Medical Issues Being Addressed:    Elevated blood pressure-reviewed the chart and patient takes metoprolol  37.5 mg twice daily.  Added to the current medication and also added clonidine  0.1 mg twice daily will continue to monitor blood pressure closely. 4. Discharge Planning:              -- Social work and case management to  assist with discharge planning and identification of hospital follow-up needs prior to discharge             -- Estimated LOS: 5-7 days             -- Discharge Concerns: Need to establish a safety plan; Medication compliance and effectiveness             -- Discharge Goals: Return home with outpatient referrals follow ups   Aurelia Blotter, MD 09/14/2023, 5:37 PM

## 2023-09-14 NOTE — Plan of Care (Signed)
 Patient alert and oriented. Denies SI, HI, AVH and pain. Blood glucose monitored no coverage required @bedtime  per Sliding scale. Support and encouragement provided.  Routine safety checks conducted every 15 minutes.  Patient informed to notify staff with problems or concerns. Patient verbally contracts for safety at this time. Patient interacts well with others on the unit.  Patient remains safe at this time.  Problem: Education: Goal: Knowledge of [facility] Education information/materials will improve Outcome: Progressing Goal: Emotional status will improve Outcome: Progressing Goal: Mental status will improve Outcome: Progressing Goal: Verbalization of understanding the information provided will improve Outcome: Progressing   Problem: Coping: Goal: Ability to verbalize frustrations and anger appropriately will improve Outcome: Progressing Goal: Ability to demonstrate self-control will improve Outcome: Progressing   Problem: Safety: Goal: Periods of time without injury will increase Outcome: Progressing   Problem: Health Behavior/Discharge Planning: Goal: Ability to identify changes in lifestyle to reduce recurrence of condition will improve Outcome: Progressing Goal: Identification of resources available to assist in meeting health care needs will improve Outcome: Progressing

## 2023-09-14 NOTE — Progress Notes (Signed)
   09/13/23 2156  Psych Admission Type (Psych Patients Only)  Admission Status Involuntary  Psychosocial Assessment  Patient Complaints Depression  Eye Contact Fair  Facial Expression Flat  Affect Flat  Speech Soft  Interaction Assertive  Motor Activity Slow  Appearance/Hygiene In scrubs  Behavior Characteristics Cooperative;Appropriate to situation  Mood Depressed  Aggressive Behavior  Effect No apparent injury  Thought Process  Coherency WDL  Content WDL  Delusions None reported or observed  Perception WDL  Hallucination None reported or observed  Judgment Impaired  Confusion None  Danger to Self  Current suicidal ideation? Denies  Agreement Not to Harm Self Yes  Description of Agreement Verbal  Danger to Others  Danger to Others None reported or observed

## 2023-09-15 DIAGNOSIS — F332 Major depressive disorder, recurrent severe without psychotic features: Secondary | ICD-10-CM | POA: Diagnosis not present

## 2023-09-15 LAB — GLUCOSE, CAPILLARY
Glucose-Capillary: 195 mg/dL — ABNORMAL HIGH (ref 70–99)
Glucose-Capillary: 285 mg/dL — ABNORMAL HIGH (ref 70–99)
Glucose-Capillary: 167 mg/dL — ABNORMAL HIGH (ref 70–99)
Glucose-Capillary: 255 mg/dL — ABNORMAL HIGH (ref 70–99)
Glucose-Capillary: 170 mg/dL — ABNORMAL HIGH (ref 70–99)

## 2023-09-15 MED ORDER — INSULIN GLARGINE-YFGN 100 UNIT/ML ~~LOC~~ SOLN
18.0000 [IU] | Freq: Every day | SUBCUTANEOUS | Status: DC
  Administered 2023-09-15 – 2023-09-16 (×2): 18 [IU] via SUBCUTANEOUS
  Filled 2023-09-15 (×2): qty 0.18

## 2023-09-15 NOTE — BHH Counselor (Signed)
 CSW contacted [place], [place] [phone]-  CSW spoke with operator who reports that the extension is [phone]  CSW spoke with substance use caseworker who reports she will put a note in for him to self-refer, they reach out to pt to get him scheduled following his discharge.   Derrill Flirt, MSW, [place] 09/15/2023 3:04 PM

## 2023-09-15 NOTE — Group Note (Deleted)
 Date:  09/15/2023 Time:  12:08 PM  Group Topic/Focus:  Personal Choices and Values:   The focus of this group is to help patients assess and explore the importance of values in their lives, how their values affect their decisions, how they express their values and what opposes their expression.     Participation Level:  {BHH PARTICIPATION HQION:62952}  Participation Quality:  {BHH PARTICIPATION QUALITY:22265}  Affect:  {BHH AFFECT:22266}  Cognitive:  {BHH COGNITIVE:22267}  Insight: {BHH Insight2:20797}  Engagement in Group:  {BHH ENGAGEMENT IN WUXLK:44010}  Modes of Intervention:  {BHH MODES OF INTERVENTION:22269}  Additional Comments:  ***  Kyle [name] 09/15/2023, 12:08 PM

## 2023-09-15 NOTE — Inpatient Diabetes Management (Signed)
 Inpatient Diabetes Program Recommendations  AACE/ADA: New Consensus Statement on Inpatient Glycemic Control   Target Ranges:  Prepandial:   less than 140 mg/dL      Peak postprandial:   less than 180 mg/dL (1-2 hours)      Critically ill patients:  140 - 180 mg/dL    Latest Reference Range & Units 09/14/23 07:30 09/14/23 11:43 09/14/23 16:27 09/14/23 19:55  Glucose-Capillary 70 - 99 mg/dL 646 (H) 803 (H) 212 (H) 192 (H)    Review of Glycemic Control  Diabetes history: DM2 Outpatient Diabetes medications: Novolog  28 units TID with meals, Lantus  34 units BID (was on med list on 5/13 but no longer listed) Current orders for Inpatient glycemic control: Novolog  0-15 units TID with meals, Novolog  0-5 units at bedtime, Semglee  15 units daily  Inpatient Diabetes Program Recommendations:    Insulin : Please consider increasing Semglee  to 18 units daily.  Thanks, Beacher Limerick, RN, MSN, CDCES Diabetes Coordinator Inpatient Diabetes Program [phone] (Team Pager from 8am to 5pm)

## 2023-09-15 NOTE — Progress Notes (Signed)
 [facility] MD Progress Note  09/15/2023 2:57 PM Kyle [name]  MRN:  [id number]  Kyle [name] is a 63 y.o. male admitted: Presented to the EDfor 09/09/2023  7:22 AM for increased depression and suicidal ideations, along with alcohol abuse. There are no documented previous  psychiatric diagnoses of   and has a past medical history of  DM2, HTN, DDD, CAD, etc.. His current presentation of increased anxiety, hopelessness, worthlessness and self harm thoughts  is most consistent with his current psychiatric/mental health concerns Patient is admitted to [facility] [place] psych unit with Q15 min safety monitoring. Multidisciplinary team approach is offered. Medication management; group/milieu therapy is offered.   Subjective:  Chart reviewed, case discussed in multidisciplinary meeting, patient seen during rounds.  Patient is noted to be resting in bed.  Provider clarified that his discharge is on Tuesday.  Provider also informed the patient to reach out to the [facility]: Lambert Rhonda W coordinator and discussed the substance use program options.  He denies SI/HI/plan and denies hallucinations.  He did inform the provider that he lives by himself and has all the amenities available for him.  Sleep: Fair  Appetite:  Fair  Past Psychiatric History: see h&P Family History:  Family History  Problem Relation Age of Onset   Heart disease Mother    Heart disease Father    Hypertension Father    Diabetes Sister    Cancer Sister        skin cancer   Diabetes Other    Heart disease Other    Social History:  Social History   Substance and Sexual Activity  Alcohol Use Yes   Alcohol/week: 6.0 standard drinks of alcohol   Types: 6 Cans of beer per week   Comment: pt binge drinking last 5 days - 15 to 20 beers daily     Social History   Substance and Sexual Activity  Drug Use Not Currently    Social History   Socioeconomic History   Marital status: Married    Spouse name: Not on file   Number of children: Not on file   Years of  education: 14   Highest education level: Not on file  Occupational History   Occupation: Warden/ranger Work    Comment: Disability  Tobacco Use   Smoking status: Every Day    Current packs/day: 0.33    Average packs/day: 0.3 packs/day for 35.0 years (11.6 ttl pk-yrs)    Types: Cigarettes   Smokeless tobacco: Never   Tobacco comments:    a pack lasts 2 to 4 days   Vaping Use   Vaping status: Never Used  Substance and Sexual Activity   Alcohol use: Yes    Alcohol/week: 6.0 standard drinks of alcohol    Types: 6 Cans of beer per week    Comment: pt binge drinking last 5 days - 15 to 20 beers daily   Drug use: Not Currently   Sexual activity: Yes    Partners: Female    Comment: Condoms sometimes  Other Topics Concern   Not on file  Social History Narrative   Pt states wife recently left him prompting him to binge drink.       Kyle [name] grew up in the [place] area. He was in the [place]  [employer] for 4 years. 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 He is currently disabled. Kyle [name] lives by himself but has excellent support system. He is very close  to his family. He is currently taking online courses through [employer]. He is trying to obtain a degree in social services and ministry. His goal is to work with at-risk young adults.Regular exercise: not recentlyCaffeine use: occasionally   Social Drivers of Corporate investment banker Strain: Not on file  Food Insecurity: No Food Insecurity (09/09/2023)   Hunger Vital Sign    Worried About Running Out of Food in the Last Year: Never true    Ran Out of Food in the Last Year: Never true  Transportation Needs: No Transportation Needs (09/09/2023)   PRAPARE - Administrator, Civil Service (Medical): No    Lack of Transportation (Non-Medical): No  Physical Activity: Not on file  Stress: Not on file  Social Connections: Not on file   Past Medical  History:  Past Medical History:  Diagnosis Date   Allergy    Asthma    Chronic back pain 1981   a.followed by pain clinic and neurosurgery.   COPD (chronic obstructive pulmonary disease) (HCC)    Coronary artery disease involving native coronary artery    a. 10/2012 CABG x 4 @ [facility] (LIMA->LAD, VG->RI, VG->OM, VG->PDA);  b. 08/2014 Lexiscan  MV: low risk; c. 02/2019 MV: EF 53%, apical defect (atten vs scar), No ischemia-->low risk; d. 08/2022 MV: large area of inflat/inf ischemia and scar. Nl EF.   DDD (degenerative disc disease)    Diastolic dysfunction    a. 02/2019 Echo: EF 60-65%, mod LVH. Gr2 DD. Nl RV fxn. Unable to exclude bicuspid AoV; b. 04/2020 Echo: EF 50-55% anteroseptal HK, GrI DD, nl rV fxn, mild Ao sclerosis.   Dilated aortic root (HCC)    a. 02/2019 Echo: Ao root 44mm, Asc Ao 40mm, Ao arch 27mm; b. 04/2020 Echo: Ao root 45mm. Asc Ao 39mm; c. 06/2020 CTA Chest: No Ao aneurysm or dissection.   DM2 (diabetes mellitus, type 2) (HCC)    Fainting spell    GERD (gastroesophageal reflux disease)    Hepatitis C    2/2 tattoo   History of medication noncompliance    Hyperlipidemia with target LDL less than 70    Hypertension    Polysubstance abuse (HCC)    a. ongoing daily tobacco abuse and etoh (on the weekends), remote crack/cocaine abuse last used approximately in 2006   S/P CABG x 4    a. LIMA-LAD, SVG-Ramus, SVG-OM,SVG-dRCA   Urine incontinence     Past Surgical History:  Procedure Laterality Date   CARDIAC CATHETERIZATION  11/2012   [facility] - Multivessel CAD   CORONARY ARTERY BYPASS GRAFT  8/14   CABG x 4; LIMA-LAD, SVG-RI, SVG-OM1, SVG-rPDA (Atretic RIMA).   neck injection      Current Medications: Current Facility-Administered Medications  Medication Dose Route Frequency Provider Last Rate Last Admin   acetaminophen  (TYLENOL ) tablet 650 mg  650 mg Oral Q6H PRN Dorthea Gauze, NP   650 mg at 09/10/23 2138   alum & mag hydroxide-simeth  (MAALOX/MYLANTA) 200-200-20 MG/5ML suspension 30 mL  30 mL Oral Q4H PRN Dorthea Gauze, NP       cloNIDine  (CATAPRES ) tablet 0.1 mg  0.1 mg Oral BID Zain Lankford, MD   0.1 mg at 09/15/23 0940   feeding supplement (GLUCERNA SHAKE) (GLUCERNA SHAKE) liquid 237 mL  237 mL Oral TID BM Dravin Lance, MD   237 mL at 09/15/23 1012   insulin  aspart (novoLOG ) injection 0-15 Units  0-15 Units Subcutaneous TID WC Tito Ausmus, MD  3 Units at 09/15/23 1213   insulin  aspart (novoLOG ) injection 0-5 Units  0-5 Units Subcutaneous QHS Mariama Saintvil, MD   2 Units at 09/10/23 2133   insulin  glargine-yfgn (SEMGLEE ) injection 18 Units  18 Units Subcutaneous Daily Bryan Goin, MD   18 Units at 09/15/23 0940   magnesium  hydroxide (MILK OF MAGNESIA) suspension 30 mL  30 mL Oral Daily PRN Dorthea Gauze, NP       metoprolol  tartrate (LOPRESSOR ) tablet 37.5 mg  37.5 mg Oral BID Damarco Keysor, MD   37.5 mg at 09/15/23 0940   multivitamin with minerals tablet 1 tablet  1 tablet Oral Daily Harrison Paulson, MD   1 tablet at 09/15/23 0940   OLANZapine  (ZYPREXA ) injection 5 mg  5 mg Intramuscular TID PRN Dorthea Gauze, NP       OLANZapine  zydis (ZYPREXA ) disintegrating tablet 5 mg  5 mg Oral TID PRN Dorthea Gauze, NP       sertraline  (ZOLOFT ) tablet 50 mg  50 mg Oral Daily Shakeyla Giebler, MD   50 mg at 09/15/23 0940    Lab Results:  Results for orders placed or performed during the hospital encounter of 09/09/23 (from the past 48 hours)  Glucose, capillary     Status: Abnormal   Collection Time: 09/13/23  4:19 PM  Result Value Ref Range   Glucose-Capillary 214 (H) 70 - 99 mg/dL    Comment: Glucose reference range applies only to samples taken after fasting for at least 8 hours.  Glucose, capillary     Status: Abnormal   Collection Time: 09/13/23  9:01 PM  Result Value Ref Range   Glucose-Capillary 130 (H) 70 - 99 mg/dL    Comment: Glucose reference range applies only to samples taken after fasting for at  least 8 hours.  Glucose, capillary     Status: Abnormal   Collection Time: 09/14/23  7:30 AM  Result Value Ref Range   Glucose-Capillary 237 (H) 70 - 99 mg/dL    Comment: Glucose reference range applies only to samples taken after fasting for at least 8 hours.  Glucose, capillary     Status: Abnormal   Collection Time: 09/14/23 11:43 AM  Result Value Ref Range   Glucose-Capillary 129 (H) 70 - 99 mg/dL    Comment: Glucose reference range applies only to samples taken after fasting for at least 8 hours.  Glucose, capillary     Status: Abnormal   Collection Time: 09/14/23  4:27 PM  Result Value Ref Range   Glucose-Capillary 227 (H) 70 - 99 mg/dL    Comment: Glucose reference range applies only to samples taken after fasting for at least 8 hours.  Glucose, capillary     Status: Abnormal   Collection Time: 09/14/23  7:55 PM  Result Value Ref Range   Glucose-Capillary 192 (H) 70 - 99 mg/dL    Comment: Glucose reference range applies only to samples taken after fasting for at least 8 hours.  Glucose, capillary     Status: Abnormal   Collection Time: 09/15/23  7:35 AM  Result Value Ref Range   Glucose-Capillary 285 (H) 70 - 99 mg/dL    Comment: Glucose reference range applies only to samples taken after fasting for at least 8 hours.  Glucose, capillary     Status: Abnormal   Collection Time: 09/15/23 11:29 AM  Result Value Ref Range   Glucose-Capillary 167 (H) 70 - 99 mg/dL    Comment: Glucose reference range applies only to samples taken after  fasting for at least 8 hours.    Blood Alcohol level:  Lab Results  Component Value Date   ETH 191 (H) 09/09/2023    Metabolic Disorder Labs: Lab Results  Component Value Date   HGBA1C 9.7 (H) 09/09/2023   MPG 232 09/09/2023   No results found for: "PROLACTIN" Lab Results  Component Value Date   CHOL 156 06/08/2018   TRIG 212 (H) 06/08/2018   HDL 36 (L) 06/08/2018   CHOLHDL 4.3 06/08/2018   VLDL 16.1 (H) 11/24/2013   LDLCALC 78  06/08/2018   LDLCALC 78 08/14/2015    Physical Findings: AIMS:  , ,  ,  ,    CIWA:  CIWA-Ar Total: 0 COWS:      Psychiatric Specialty Exam:  Presentation  General Appearance:  Appropriate for Environment; Casual  Eye Contact: Fair  Speech: Clear and Coherent  Speech Volume: Normal    Mood and Affect  Mood: good Affect: euthymic  Thought Process  Thought Processes: Coherent  Descriptions of Associations:Intact  Orientation:Full (Time, Place and Person)  Thought Content:Logical  Hallucinations:denies  Ideas of Reference:None  Suicidal Thoughts:denies  Homicidal Thoughts:denies   Sensorium  Memory: Immediate Fair; Recent Fair; Remote Fair  Judgment: Impaired  Insight: Shallow   Executive Functions  Concentration: Fair  Attention Span: Fair  Recall: [employer] of Knowledge: Fair  Language: Fair   Psychomotor Activity  Psychomotor Activity: No data recorded  Musculoskeletal: Strength & Muscle Tone: within normal limits Gait & Station: normal Assets  Assets: Manufacturing systems engineer; Desire for Improvement; Resilience    Physical Exam: Physical Exam Vitals and nursing note reviewed.  HENT:     Head: Normocephalic.     Nose: Nose normal.     Mouth/Throat:     Mouth: Mucous membranes are moist.  Cardiovascular:     Rate and Rhythm: Normal rate.  Pulmonary:     Effort: Pulmonary effort is normal.  Abdominal:     General: Bowel sounds are normal.  Skin:    General: Skin is warm.  Neurological:     Mental Status: He is alert.    Review of Systems  Constitutional: Negative.   HENT: Negative.    Eyes: Negative.   Cardiovascular: Negative.   Skin: Negative.    Blood pressure (!) 152/85, pulse 71, temperature 97.9 F (36.6 C), resp. rate 15, height 5\' 8"  (1.727 m), weight 63.7 kg, SpO2 100%. Body mass index is 21.36 kg/m.  Diagnosis: Principal Problem:   MDD (major depressive disorder), recurrent severe, without  psychosis (HCC)  Clinical Decision Making: Patient with hx of chronic alcoholism, depression and anxiety admitted for worsening depression, hopelessness in the context of recent breakup with girlfriend. He was holding a loaded gun when cops showed. It was reported that cops took his gun and pt will continue inpatient hospitalization for stabilization.   Treatment Plan Summary:   Safety and Monitoring:             -- Involuntary admission to inpatient psychiatric unit for safety, stabilization and treatment             -- Daily contact with patient to assess and evaluate symptoms and progress in treatment             -- Patient's case to be discussed in multi-disciplinary team meeting             -- Observation Level: q15 minute checks             --  Vital signs:  q12 hours             -- Precautions: suicide, elopement, and assault   2. Psychiatric Diagnoses and Treatment:                 zoloft  50 mg daily CIWA protocol with librium  taper completed. Added clonidine  for poorly controlled BP   -- The risks/benefits/side-effects/alternatives to this medication were discussed in detail with the patient and time was given for questions. The patient consents to medication trial.                -- Metabolic profile and EKG monitoring obtained while on an atypical antipsychotic (BMI: Lipid Panel: HbgA1c: QTc:)              -- Encouraged patient to participate in unit milieu and in scheduled group therapies                            3. Medical Issues Being Addressed:    Elevated blood pressure-reviewed the chart and patient takes metoprolol  37.5 mg twice daily.  Added to the current medication and also added clonidine  0.1 mg twice daily will continue to monitor blood pressure closely. 4. Discharge Planning:              -- Social work and case management to assist with discharge planning and identification of hospital follow-up needs prior to discharge             -- Estimated LOS: 5-7 days              -- Discharge Concerns: Need to establish a safety plan; Medication compliance and effectiveness             -- Discharge Goals: Return home with outpatient referrals follow ups   Aurelia Blotter, MD 09/15/2023, 2:57 PM

## 2023-09-15 NOTE — BH IP Treatment Plan (Signed)
 Interdisciplinary Treatment and Diagnostic Plan Update  09/15/2023 Time of Session: 9:30 AM  Kyle [name] MRN: [id number]  Principal Diagnosis: MDD (major depressive disorder), recurrent severe, without psychosis (HCC)  Secondary Diagnoses: Principal Problem:   MDD (major depressive disorder), recurrent severe, without psychosis (HCC)   Current Medications:  Current Facility-Administered Medications  Medication Dose Route Frequency Provider Last Rate Last Admin   acetaminophen  (TYLENOL ) tablet 650 mg  650 mg Oral Q6H PRN Dorthea Gauze, NP   650 mg at 09/10/23 2138   alum & mag hydroxide-simeth (MAALOX/MYLANTA) 200-200-20 MG/5ML suspension 30 mL  30 mL Oral Q4H PRN Dorthea Gauze, NP       cloNIDine  (CATAPRES ) tablet 0.1 mg  0.1 mg Oral BID Jadapalle, Sree, MD   0.1 mg at 09/15/23 0940   feeding supplement (GLUCERNA SHAKE) (GLUCERNA SHAKE) liquid 237 mL  237 mL Oral TID BM Jadapalle, Sree, MD   237 mL at 09/15/23 1012   insulin  aspart (novoLOG ) injection 0-15 Units  0-15 Units Subcutaneous TID WC Jadapalle, Sree, MD   3 Units at 09/15/23 1213   insulin  aspart (novoLOG ) injection 0-5 Units  0-5 Units Subcutaneous QHS Jadapalle, Sree, MD   2 Units at 09/10/23 2133   insulin  glargine-yfgn (SEMGLEE ) injection 18 Units  18 Units Subcutaneous Daily Jadapalle, Sree, MD   18 Units at 09/15/23 0940   magnesium  hydroxide (MILK OF MAGNESIA) suspension 30 mL  30 mL Oral Daily PRN Dorthea Gauze, NP       metoprolol  tartrate (LOPRESSOR ) tablet 37.5 mg  37.5 mg Oral BID Jadapalle, Sree, MD   37.5 mg at 09/15/23 0940   multivitamin with minerals tablet 1 tablet  1 tablet Oral Daily Jadapalle, Sree, MD   1 tablet at 09/15/23 0940   OLANZapine  (ZYPREXA ) injection 5 mg  5 mg Intramuscular TID PRN Dorthea Gauze, NP       OLANZapine  zydis (ZYPREXA ) disintegrating tablet 5 mg  5 mg Oral TID PRN Dorthea Gauze, NP       sertraline  (ZOLOFT ) tablet 50 mg  50 mg Oral Daily Jadapalle, Sree, MD   50 mg at 09/15/23  0940   PTA Medications: Medications Prior to Admission  Medication Sig Dispense Refill Last Dose/Taking   benzonatate  (TESSALON  PERLES) 100 MG capsule Take 1 capsule (100 mg total) by mouth 3 (three) times daily as needed for cough. 30 capsule 0    FLONASE ALLERGY RELIEF 50 MCG/ACT nasal spray Place into both nostrils. (Patient not taking: Reported on 09/09/2023)      insulin  aspart (NOVOLOG ) 100 UNIT/ML FlexPen Inject 28 Units into the skin with breakfast, with lunch, and with evening meal.      ipratropium-albuterol  (DUONEB) 0.5-2.5 (3) MG/3ML SOLN Take 3 mLs by nebulization every 6 (six) hours as needed. 1080 mL 0    meclizine  (ANTIVERT ) 12.5 MG tablet Take 1 tablet (12.5 mg total) by mouth 3 (three) times daily as needed for dizziness. 10 tablet 0     Patient Stressors: Substance abuse   Other: wife left him    Patient Strengths: Average or above average intelligence  Capable of independent living  Communication skills   Treatment Modalities: Medication Management, Group therapy, Case management,  1 to 1 session with clinician, Psychoeducation, Recreational therapy.   Physician Treatment Plan for Primary Diagnosis: MDD (major depressive disorder), recurrent severe, without psychosis (HCC) Long Term Goal(s): Improvement in symptoms so as ready for discharge   Short Term Goals: Ability to identify changes in lifestyle to reduce recurrence of  condition will improve Ability to verbalize feelings will improve Ability to disclose and discuss suicidal ideas Ability to demonstrate self-control will improve Ability to identify and develop effective coping behaviors will improve Ability to maintain clinical measurements within normal limits will improve Compliance with prescribed medications will improve Ability to identify triggers associated with substance abuse/mental health issues will improve  Medication Management: Evaluate patient's response, side effects, and tolerance of  medication regimen.  Therapeutic Interventions: 1 to 1 sessions, Unit Group sessions and Medication administration.  Evaluation of Outcomes: Progressing  Physician Treatment Plan for Secondary Diagnosis: Principal Problem:   MDD (major depressive disorder), recurrent severe, without psychosis (HCC)  Long Term Goal(s): Improvement in symptoms so as ready for discharge   Short Term Goals: Ability to identify changes in lifestyle to reduce recurrence of condition will improve Ability to verbalize feelings will improve Ability to disclose and discuss suicidal ideas Ability to demonstrate self-control will improve Ability to identify and develop effective coping behaviors will improve Ability to maintain clinical measurements within normal limits will improve Compliance with prescribed medications will improve Ability to identify triggers associated with substance abuse/mental health issues will improve     Medication Management: Evaluate patient's response, side effects, and tolerance of medication regimen.  Therapeutic Interventions: 1 to 1 sessions, Unit Group sessions and Medication administration.  Evaluation of Outcomes: Progressing   RN Treatment Plan for Primary Diagnosis: MDD (major depressive disorder), recurrent severe, without psychosis (HCC) Long Term Goal(s): Knowledge of disease and therapeutic regimen to maintain health will improve  Short Term Goals: Ability to remain free from injury will improve, Ability to verbalize frustration and anger appropriately will improve, Ability to demonstrate self-control, Ability to participate in decision making will improve, Ability to verbalize feelings will improve, Ability to disclose and discuss suicidal ideas, Ability to identify and develop effective coping behaviors will improve, and Compliance with prescribed medications will improve  Medication Management: RN will administer medications as ordered by provider, will assess and  evaluate patient's response and provide education to patient for prescribed medication. RN will report any adverse and/or side effects to prescribing provider.  Therapeutic Interventions: 1 on 1 counseling sessions, Psychoeducation, Medication administration, Evaluate responses to treatment, Monitor vital signs and CBGs as ordered, Perform/monitor CIWA, COWS, AIMS and Fall Risk screenings as ordered, Perform wound care treatments as ordered.  Evaluation of Outcomes: Progressing   LCSW Treatment Plan for Primary Diagnosis: MDD (major depressive disorder), recurrent severe, without psychosis (HCC) Long Term Goal(s): Safe transition to appropriate next level of care at discharge, Engage patient in therapeutic group addressing interpersonal concerns.  Short Term Goals: Engage patient in aftercare planning with referrals and resources, Increase social support, Increase ability to appropriately verbalize feelings, Increase emotional regulation, Facilitate acceptance of mental health diagnosis and concerns, Facilitate patient progression through stages of change regarding substance use diagnoses and concerns, Identify triggers associated with mental health/substance abuse issues, and Increase skills for wellness and recovery  Therapeutic Interventions: Assess for all discharge needs, 1 to 1 time with Social worker, Explore available resources and support systems, Assess for adequacy in community support network, Educate family and significant other(s) on suicide prevention, Complete Psychosocial Assessment, Interpersonal group therapy.  Evaluation of Outcomes: Progressing   Progress in Treatment: Attending groups: Yes and No. Participating in groups: Yes and No. Taking medication as prescribed: Yes. Toleration medication: Yes. Family/Significant other contact made: No, will contact:  CSW will contact if given permission  Patient understands diagnosis: Yes. Discussing patient identified  problems/goals with staff: Yes. Medical problems stabilized or resolved: Yes. Denies suicidal/homicidal ideation: Yes. Issues/concerns per patient self-inventory: No. Other: None   New problem(s) identified: No, Describe:  None identified Update 09/15/23: No changes at this time   New Short Term/Long Term Goal(s): detox, elimination of symptoms of psychosis, medication management for mood stabilization; elimination of SI thoughts; development of comprehensive mental wellness/sobriety plan. Update 09/15/23: No changes at this time   Patient Goals:  "To get me off this alcohol" Update 09/15/23: No changes at this time   Discharge Plan or Barriers: CSW will assist with appropriate discharge planning Update 09/15/23: No changes at this time   Reason for Continuation of Hospitalization: Medication stabilization Withdrawal symptoms   Estimated Length of Stay:  1 to 7 days Update 09/15/23: TBD  Last 3 [place] Suicide Severity Risk Score: Flowsheet Row Admission (Current) from 09/09/2023 in [facility] Most recent reading at 09/09/2023 10:40 PM ED from 09/09/2023 in [facility] Emergency Department at [facility] Most recent reading at 09/09/2023 12:02 PM ED from 09/01/2023 in [facility] Emergency Department at [facility] Most recent reading at 09/01/2023  1:20 PM  C-SSRS RISK CATEGORY No Risk High Risk No Risk       Last PHQ 2/9 Scores:    11/07/2015    9:27 AM 10/04/2015    3:23 PM 08/08/2015    9:08 AM  Depression screen PHQ 2/9  Decreased Interest 0 0 0  Down, Depressed, Hopeless 0 0 0  PHQ - 2 Score 0 0 0    Scribe for Treatment Team: Claudio Culver, [place] 09/15/2023 2:03 PM

## 2023-09-15 NOTE — Progress Notes (Signed)
 Patient is pleasant and cooperative.  Appropriate affect.  Denies SI/HI and AVH.  Denies anxiety and depression.  Denies pain.  Reports he slept well.  Compliant with scheduled medications.  15 min checks in place for safety.  Patient is present in the milieu.  Appropriate interaction with peers and staff.

## 2023-09-15 NOTE — Group Note (Signed)
 Recreation Therapy Group Note   Group Topic:Emotion Expression  Group Date: 09/15/2023 Start Time: 1400 End Time: 1500 Facilitators: Deatrice Factor, LRT, CTRS Location: Courtyard  Group Description: Music. Patients encouraged to name their favorite song(s) for LRT to play song through speaker for group to hear. Patient educated on the definition of leisure and the importance of having different leisure interests outside of the hospital. Group discussed how leisure activities can often be used as Pharmacologist and that listening to music is one example. Patients also assisted in watering the raised garden beds in the courtyard.   Goal Area(s) Addressed:  Patient will identify a current leisure interest.  Patient will practice making a positive decision. Patient will have the opportunity to try a new leisure activity.   Affect/Mood: Appropriate and Flat   Participation Level: Moderate   Participation Quality: Independent   Behavior: Calm and Cooperative   Speech/Thought Process: Coherent   Insight: Fair   Judgement: Good   Modes of Intervention: Music   Patient Response to Interventions:  Attentive and Receptive   Education Outcome:  Acknowledges education   Clinical Observations/Individualized Feedback: Tyrik was active in their participation of session activities and group discussion. Pt minimally interacted with LRT and peers while present in group.    Plan: Continue to engage patient in RT group sessions 2-3x/week.   Deatrice Factor, LRT, CTRS 09/15/2023 4:46 PM

## 2023-09-15 NOTE — Group Note (Signed)
 Date:  09/15/2023 Time:  12:04 AM  Group Topic/Focus:  Wrap-Up Group:   The focus of this group is to help patients review their daily goal of treatment and discuss progress on daily workbooks.    Participation Level:  Active  Participation Quality:  Appropriate  Affect:  Appropriate  Cognitive:  Alert  Insight: Good  Engagement in Group:  Engaged  Modes of Intervention:  Discussion  Additional Comments:    Kyle [name] 09/15/2023, 12:04 AM

## 2023-09-15 NOTE — Progress Notes (Signed)
   09/15/23 2200  Psych Admission Type (Psych Patients Only)  Admission Status Involuntary  Psychosocial Assessment  Patient Complaints None  Eye Contact Fair  Facial Expression Animated  Affect Appropriate to circumstance  Speech Soft  Interaction Assertive  Motor Activity Slow  Appearance/Hygiene In scrubs  Behavior Characteristics Cooperative  Mood Pleasant  Aggressive Behavior  Effect No apparent injury  Thought Process  Coherency WDL  Content WDL  Delusions None reported or observed  Perception WDL  Hallucination None reported or observed  Judgment Impaired  Confusion None  Danger to Self  Current suicidal ideation? Denies  Agreement Not to Harm Self Yes  Description of Agreement verbal  Danger to Others  Danger to Others None reported or observed

## 2023-09-15 NOTE — Plan of Care (Signed)
   Problem: Coping: Goal: Ability to demonstrate self-control will improve Outcome: Progressing   Problem: Health Behavior/Discharge Planning: Goal: Compliance with treatment plan for underlying cause of condition will improve Outcome: Progressing

## 2023-09-15 NOTE — Group Note (Signed)
 Date:  09/15/2023 Time:  10:11 PM  Group Topic/Focus:  Wrap-Up Group:   The focus of this group is to help patients review their daily goal of treatment and discuss progress on daily workbooks.    Participation Level:  Active  Participation Quality:  Appropriate  Affect:  Appropriate  Cognitive:  Appropriate  Insight: Good  Engagement in Group:  Engaged  Modes of Intervention:  Discussion  Additional Comments:    Kyle [name] 09/15/2023, 10:11 PM

## 2023-09-15 NOTE — Plan of Care (Signed)
  Problem: Education: Goal: Knowledge of [facility] Education information/materials will improve Outcome: Progressing Goal: Emotional status will improve Outcome: Progressing Goal: Mental status will improve Outcome: Progressing Goal: Verbalization of understanding the information provided will improve Outcome: Progressing   Problem: Activity: Goal: Interest or engagement in activities will improve Outcome: Progressing Goal: Sleeping patterns will improve Outcome: Progressing   Problem: Coping: Goal: Ability to verbalize frustrations and anger appropriately will improve Outcome: Progressing Goal: Ability to demonstrate self-control will improve Outcome: Progressing   Problem: Health Behavior/Discharge Planning: Goal: Identification of resources available to assist in meeting health care needs will improve Outcome: Progressing Goal: Compliance with treatment plan for underlying cause of condition will improve Outcome: Progressing   Problem: Physical Regulation: Goal: Ability to maintain clinical measurements within normal limits will improve Outcome: Progressing   Problem: Safety: Goal: Periods of time without injury will increase Outcome: Progressing   Problem: Education: Goal: Knowledge of disease or condition will improve Outcome: Progressing Goal: Understanding of discharge needs will improve Outcome: Progressing   Problem: Health Behavior/Discharge Planning: Goal: Ability to identify changes in lifestyle to reduce recurrence of condition will improve Outcome: Progressing Goal: Identification of resources available to assist in meeting health care needs will improve Outcome: Progressing   Problem: Physical Regulation: Goal: Complications related to the disease process, condition or treatment will be avoided or minimized Outcome: Progressing   Problem: Safety: Goal: Ability to remain free from injury will improve Outcome: Progressing   Problem:  Activity: Goal: Will identify at least one activity in which they can participate Outcome: Progressing   Problem: Coping: Goal: Ability to identify and develop effective coping behavior will improve Outcome: Progressing Goal: Ability to interact with others will improve Outcome: Progressing Goal: Demonstration of participation in decision-making regarding own care will improve Outcome: Progressing Goal: Ability to use eye contact when communicating with others will improve Outcome: Progressing   Problem: Health Behavior/Discharge Planning: Goal: Identification of resources available to assist in meeting health care needs will improve Outcome: Progressing   Problem: Self-Concept: Goal: Will verbalize positive feelings about self Outcome: Progressing   Problem: Education: Goal: Ability to describe self-care measures that Lisa Milian prevent or decrease complications (Diabetes Survival Skills Education) will improve Outcome: Progressing Goal: Individualized Educational Video(s) Outcome: Progressing   Problem: Coping: Goal: Ability to adjust to condition or change in health will improve Outcome: Progressing   Problem: Fluid Volume: Goal: Ability to maintain a balanced intake and output will improve Outcome: Progressing   Problem: Health Behavior/Discharge Planning: Goal: Ability to identify and utilize available resources and services will improve Outcome: Progressing Goal: Ability to manage health-related needs will improve Outcome: Progressing   Problem: Metabolic: Goal: Ability to maintain appropriate glucose levels will improve Outcome: Progressing   Problem: Nutritional: Goal: Maintenance of adequate nutrition will improve Outcome: Progressing Goal: Progress toward achieving an optimal weight will improve Outcome: Progressing   Problem: Skin Integrity: Goal: Risk for impaired skin integrity will decrease Outcome: Progressing   Problem: Tissue Perfusion: Goal:  Adequacy of tissue perfusion will improve Outcome: Progressing

## 2023-09-15 NOTE — Group Note (Signed)
 Date:  09/15/2023 Time:  12:12 PM  Group Topic/Focus:  The purpose of this group is to touch into patients past and discuss what it was like and some of their likes and dislikes from their past.    Participation Level:  Minimal  Participation Quality:  Appropriate  Affect:  Appropriate  Cognitive:  Appropriate  Insight: Appropriate  Engagement in Group:  Limited  Modes of Intervention:  Activity and Discussion  Additional Comments:    Linnell Richardson 09/15/2023, 12:12 PM

## 2023-09-16 DIAGNOSIS — F332 Major depressive disorder, recurrent severe without psychotic features: Secondary | ICD-10-CM | POA: Diagnosis not present

## 2023-09-16 LAB — GLUCOSE, CAPILLARY
Glucose-Capillary: 211 mg/dL — ABNORMAL HIGH (ref 70–99)
Glucose-Capillary: 225 mg/dL — ABNORMAL HIGH (ref 70–99)

## 2023-09-16 MED ORDER — INSULIN ASPART 100 UNIT/ML IJ SOLN: 0.0000 [IU] | Freq: Three times a day (TID) | INTRAMUSCULAR | 11 refills | Status: AC

## 2023-09-16 MED ORDER — INSULIN ASPART 100 UNIT/ML IJ SOLN: 0.0000 [IU] | Freq: Every day | INTRAMUSCULAR | 11 refills | Status: AC

## 2023-09-16 MED ORDER — SERTRALINE HCL 50 MG PO TABS: 50.0000 mg | ORAL_TABLET | Freq: Every day | ORAL | 0 refills | Status: AC

## 2023-09-16 MED ORDER — CLONIDINE HCL 0.1 MG PO TABS: 0.1000 mg | ORAL_TABLET | Freq: Two times a day (BID) | ORAL | 11 refills | Status: DC

## 2023-09-16 MED ORDER — METOPROLOL TARTRATE 37.5 MG PO TABS: 37.5000 mg | ORAL_TABLET | Freq: Two times a day (BID) | ORAL | 0 refills | Status: DC

## 2023-09-16 MED ORDER — INSULIN GLARGINE-YFGN 100 UNIT/ML ~~LOC~~ SOLN: 18.0000 [IU] | Freq: Every day | SUBCUTANEOUS | 11 refills | Status: AC

## 2023-09-16 MED ORDER — ADULT MULTIVITAMIN W/MINERALS CH: 1.0000 | ORAL_TABLET | Freq: Every day | ORAL | 0 refills | Status: AC

## 2023-09-16 NOTE — Plan of Care (Signed)
   Problem: Education: Goal: Emotional status will improve Outcome: Progressing Goal: Mental status will improve Outcome: Progressing   Problem: Activity: Goal: Interest or engagement in activities will improve Outcome: Progressing

## 2023-09-16 NOTE — Progress Notes (Signed)
 Discharge Note:  Patient denies SI/HI/AVH at this time. Discharge instructions, AVS, prescriptions, and transition record reviewed with patient.  Patient agrees to comply with medication management and follow-up visit.   Patient belongings returned to patient (keys, wallet, cell phone).  Patient questions and concerns addressed and answered.  Patient ambulatory off unit.  Patient discharged home.  Patient completed Suicide Safety Plan.  Copy sent home with discharge paperwork.

## 2023-09-16 NOTE — [facility] Suicide Risk Assessment (Addendum)
 [facility] Discharge Suicide Risk Assessment   Principal Problem: MDD (major depressive disorder), recurrent severe, without psychosis (HCC) Discharge Diagnoses: Principal Problem:   MDD (major depressive disorder), recurrent severe, without psychosis (HCC)   Total Time spent with patient: 30 minutes  Musculoskeletal: Strength & Muscle Tone: within normal limits Gait & Station: normal Patient leans: N/A  Psychiatric Specialty Exam  Presentation  General Appearance:  Appropriate for Environment; Casual  Eye Contact: Fair  Speech: Clear and Coherent  Speech Volume: Normal  Handedness: Right   Mood and Affect  Mood: good   Affect: euthymic   Thought Process  Thought Processes: Coherent  Descriptions of Associations:Intact  Orientation:Full (Time, Place and Person)  Thought Content:Logical  History of Schizophrenia/Schizoaffective disorder:No  Duration of Psychotic Symptoms:No data recorded Hallucinations:denies Ideas of Reference:None  Suicidal Thoughts:denies Homicidal Thoughts:denies  Sensorium  Memory: Immediate Fair; Recent Fair; Remote Fair  Judgment: Impaired  Insight: Shallow   Executive Functions  Concentration: Fair  Attention Span: Fair  Recall: [employer] of Knowledge: Fair  Language: Fair   Psychomotor Activity  Psychomotor Activity:No data recorded  Assets  Assets: Communication Skills; Desire for Improvement; Resilience   Sleep  Sleep:No data recorded  Physical Exam: Physical Exam ROS Blood pressure 111/73, pulse 66, temperature 97.6 F (36.4 C), resp. rate 18, height 5\' 8"  (1.727 m), weight 63.7 kg, SpO2 100%. Body mass index is 21.36 kg/m.  Mental Status Per Nursing Assessment::   On Admission:  NA  Demographic Factors:  Male  Loss Factors: Decrease in vocational status  Historical Factors: Impulsivity  Risk Reduction Factors:   Positive social support, Positive therapeutic relationship, and  Positive coping skills or problem solving skills  Continued Clinical Symptoms:  Depression:   Comorbid alcohol abuse/dependence  Cognitive Features That Contribute To Risk:  None    Suicide Risk:  Minimal: No identifiable suicidal ideation.  Patients presenting with no risk factors but with morbid ruminations; may be classified as minimal risk based on the severity of the depressive symptoms    Plan Of Care/Follow-up recommendations:  Activity:  As tolerated  Kyle Blotter, MD 09/16/2023, 9:38 AM

## 2023-09-16 NOTE — Group Note (Signed)
 Date:  09/16/2023 Time:  10:13 AM  Group Topic/Focus:  Unit expectations and House keeping rules.    Participation Level:  Did Not Attend   Merton Abts 09/16/2023, 10:13 AM

## 2023-09-16 NOTE — Progress Notes (Signed)
  [facility] Adult Case Management Discharge Plan :  Will you be returning to the same living situation after discharge:  Yes,  pt will return home  At discharge, do you have transportation home?: Yes,  CSW will assist with appropriate discharge planning  Do you have the ability to pay for your medications: Yes,  [employer] [facility] [employer]  Release of information consent forms completed and in the chart;  Patient's signature needed at discharge.  Patient to Follow up at:  Follow-up Information     [facility] on 09/16/2023.   Specialty: General Practice Why: A referral has been sent on your behalf to the [place]. They should be contacting you following your discharge from the hospital to schedule you for admission into the substance use program. Contact information: [address] [place] [address] [phone]                 Next level of care provider has access to [facility] Link:no  Safety Planning and Suicide Prevention discussed: Yes,  SPE completed with pt      Has patient been referred to the Quitline?: Yes, faxed/e-referral on 09/16/2023  Patient has been referred for addiction treatment: Yes, the patient will follow up with an outpatient provider for substance use disorder. Psychiatrist/APP: patient to schedule appointment  Claudio Culver, LCSWA 09/16/2023, 11:19 AM

## 2023-09-16 NOTE — Discharge Summary (Signed)
 Physician Discharge Summary Note  Patient:  K[name] is an 63 y.o., male MRN:  [id number] DOB:  [birth year] Patient phone:  [phone] (home)  Patient address:   [address] [place] [address],    Date of Admission:  09/09/2023 Date of Discharge: 09/16/23  Reason for Admission:  Kyle [name] is a 63 y.o. male admitted: Presented to the EDfor 09/09/2023  7:22 AM for increased depression and suicidal ideations, along with alcohol abuse. There are no documented previous  psychiatric diagnoses of   and has a past medical history of  DM2, HTN, DDD, CAD, etc.. His current presentation of increased anxiety, hopelessness, worthlessness and self harm thoughts  is most consistent with his current psychiatric/mental health concerns Patient is admitted to [facility] psych unit with Q15 min safety monitoring. Multidisciplinary team approach is offered. Medication management; group/milieu therapy is offered   Principal Problem: MDD (major depressive disorder), recurrent severe, without psychosis (HCC) Discharge Diagnoses: Principal Problem:   MDD (major depressive disorder), recurrent severe, without psychosis (HCC)   Past Psychiatric History: see h&p  Family Psychiatric  History: see h&p Social History:  Social History   Substance and Sexual Activity  Alcohol Use Yes   Alcohol/week: 6.0 standard drinks of alcohol   Types: 6 Cans of beer per week   Comment: pt binge drinking last 5 days - 15 to 20 beers daily     Social History   Substance and Sexual Activity  Drug Use Not Currently    Social History   Socioeconomic History   Marital status: Married    Spouse name: Not on file   Number of children: Not on file   Years of education: 14   Highest education level: Not on file  Occupational History   Occupation: Warden/ranger Work    Comment: Disability  Tobacco Use   Smoking status: Every Day    Current packs/day: 0.33    Average packs/day: 0.3 packs/day for  35.0 years (11.6 ttl pk-yrs)    Types: Cigarettes   Smokeless tobacco: Never   Tobacco comments:    a pack lasts 2 to 4 days   Vaping Use   Vaping status: Never Used  Substance and Sexual Activity   Alcohol use: Yes    Alcohol/week: 6.0 standard drinks of alcohol    Types: 6 Cans of beer per week    Comment: pt binge drinking last 5 days - 15 to 20 beers daily   Drug use: Not Currently   Sexual activity: Yes    Partners: Female    Comment: Condoms sometimes  Other Topics Concern   Not on file  Social History Narrative   Pt states wife recently left him prompting him to binge drink.       [name] He was in [place]  [employer] for 4 years. 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 He is currently disabled. [name] lives by himself but has excellent support system. He is very close to his family. He is currently taking online courses through [employer]. He is trying to obtain a degree in social services and ministry. His goal is to work with at-risk young adults.Regular exercise: not recentlyCaffeine use: occasionally   Social Drivers of Corporate investment banker Strain: Not on file  Food Insecurity: No Food Insecurity (09/09/2023)   Hunger Vital Sign    Worried About Running  Out of Food in the Last Year: Never true    Ran Out of Food in the Last Year: Never true  Transportation Needs: No Transportation Needs (09/09/2023)   PRAPARE - Administrator, Civil Service (Medical): No    Lack of Transportation (Non-Medical): No  Physical Activity: Not on file  Stress: Not on file  Social Connections: Not on file   Past Medical History:  Past Medical History:  Diagnosis Date   Allergy    Asthma    Chronic back pain 1981   a.followed by pain clinic and neurosurgery.   COPD (chronic obstructive pulmonary disease) (HCC)    Coronary artery disease involving native coronary  artery    a. 10/2012 CABG x 4 @ [name] (LIMA->LAD, VG->RI, VG->OM, VG->PDA);  b. 08/2014 Lexiscan  MV: low risk; c. 02/2019 MV: EF 53%, apical defect (atten vs scar), No ischemia-->low risk; d. 08/2022 MV: large area of inflat/inf ischemia and scar. Nl EF.   DDD (degenerative disc disease)    Diastolic dysfunction    a. 02/2019 Echo: EF 60-65%, mod LVH. Gr2 DD. Nl RV fxn. Unable to exclude bicuspid AoV; b. 04/2020 Echo: EF 50-55% anteroseptal HK, GrI DD, nl rV fxn, mild Ao sclerosis.   Dilated aortic root (HCC)    a. 02/2019 Echo: Ao root 44mm, Asc Ao 40mm, Ao arch 27mm; b. 04/2020 Echo: Ao root 45mm. Asc Ao 39mm; c. 06/2020 CTA Chest: No Ao aneurysm or dissection.   DM2 (diabetes mellitus, type 2) (HCC)    Fainting spell    GERD (gastroesophageal reflux disease)    Hepatitis C    2/2 tattoo   History of medication noncompliance    Hyperlipidemia with target LDL less than 70    Hypertension    Polysubstance abuse (HCC)    a. ongoing daily tobacco abuse and etoh (on the weekends), remote crack/cocaine abuse last used approximately in 2006   S/P CABG x 4    a. LIMA-LAD, SVG-Ramus, SVG-OM,SVG-dRCA   Urine incontinence     Past Surgical History:  Procedure Laterality Date   CARDIAC CATHETERIZATION  11/2012   [name] - Multivessel CAD   CORONARY ARTERY BYPASS GRAFT  8/14   CABG x 4; LIMA-LAD, SVG-RI, SVG-OM1, SVG-rPDA (Atretic RIMA).   neck injection     Family History:  Family History  Problem Relation Age of Onset   Heart disease Mother    Heart disease Father    Hypertension Father    Diabetes Sister    Cancer Sister        skin cancer   Diabetes Other    Heart disease Other     Hospital Course:  [name] is a 63 y.o. male admitted: Presented to the EDfor 09/09/2023  7:22 AM for increased depression and suicidal ideations, along with alcohol abuse. There are no documented previous  psychiatric diagnoses of   and has a past medical history of  DM2, HTN, DDD,  CAD, etc.. His current presentation of increased anxiety, hopelessness, worthlessness and self harm thoughts  is most consistent with his current psychiatric/mental health concerns Patient is admitted to [facility] psych unit with Q15 min safety monitoring. Multidisciplinary team approach is offered. Medication management; group/milieu therapy is offered .  Patient on admission was started on CIWA protocol with Librium  taper.  He was started on Zoloft  25 mg for depression and anxiety titrated up to 50 mg daily.  Patient tolerated Librium  taper and completed the alcohol detox.  He had poorly controlled blood pressure and required scheduled clonidine  along with his medications for his hypertension.  Patient maintain safe behaviors on the unit.  He participated in groups and multidisciplinary team.  On the day of discharge patient consistently denies SI/HI/plan and denied hallucinations.  He remains future oriented and was willing to participate in outpatient mental health programs.  Physical Findings: AIMS:  , ,  ,  ,    CIWA:  CIWA-Ar Total: 0 COWS:        Psychiatric Specialty Exam:  Presentation  General Appearance:  Appropriate for Environment; Casual  Eye Contact: Fair  Speech: Clear and Coherent  Speech Volume: Decreased    Mood and Affect  Mood: Euthymic  Affect: Appropriate   Thought Process  Thought Processes: Coherent  Descriptions of Associations:Intact  Orientation:Full (Time, Place and Person)  Thought Content:Logical  Hallucinations:Hallucinations: None  Ideas of Reference:None  Suicidal Thoughts:Suicidal Thoughts: No  Homicidal Thoughts:Homicidal Thoughts: No   Sensorium  Memory: Immediate Fair; Recent Fair; Remote Fair  Judgment: Fair  Insight: Fair   Art therapist  Concentration: Fair  Attention Span: Fair  Recall: [employer] of Knowledge: Fair  Language: Fair   Psychomotor Activity  Psychomotor Activity: Psychomotor  Activity: Normal  Musculoskeletal: Strength & Muscle Tone: within normal limits Gait & Station: normal Assets  Assets: Manufacturing systems engineer; Desire for Improvement; Resilience; Social Support   Sleep  Sleep: Sleep: Fair    Physical Exam: Physical Exam ROS Blood pressure 111/73, pulse 66, temperature 97.6 F (36.4 C), resp. rate 18, height 5\' 8"  (1.727 m), weight 63.7 kg, SpO2 100%. Body mass index is 21.36 kg/m.   Social History   Tobacco Use  Smoking Status Every Day   Current packs/day: 0.33   Average packs/day: 0.3 packs/day for 35.0 years (11.6 ttl pk-yrs)   Types: Cigarettes  Smokeless Tobacco Never  Tobacco Comments   a pack lasts 2 to 4 days    Tobacco Cessation:  N/A, patient does not currently use tobacco products   Blood Alcohol level:  Lab Results  Component Value Date   ETH 191 (H) 09/09/2023    Metabolic Disorder Labs:  Lab Results  Component Value Date   HGBA1C 9.7 (H) 09/09/2023   MPG 232 09/09/2023   No results found for: "PROLACTIN" Lab Results  Component Value Date   CHOL 156 06/08/2018   TRIG 212 (H) 06/08/2018   HDL 36 (L) 06/08/2018   CHOLHDL 4.3 06/08/2018   VLDL 47.8 (H) 11/24/2013   LDLCALC 78 06/08/2018   LDLCALC 78 08/14/2015    See Psychiatric Specialty Exam and Suicide Risk Assessment completed by Attending Physician prior to discharge.  Discharge destination:  Home  Is patient on multiple antipsychotic therapies at discharge:  No   Has Patient had three or more failed trials of antipsychotic monotherapy by history:  No  Recommended Plan for Multiple Antipsychotic Therapies: NA   Allergies as of 09/16/2023       Reactions   Food    Amoxicillin Itching, Swelling   Cyclobenzaprine  Itching, Rash   rash rash   Gabapentin  Nausea And Vomiting   Methadone Itching, Nausea And Vomiting   Reaction: Gi upset   Naproxen  Hives   Penicillin G Itching   Propoxyphene Hives   Tramadol Nausea And Vomiting, Hives, Rash    Trazodone Nausea And Vomiting   Reaction: GI upset Other Reaction(s): Tachycardia   Trazodone And Nefazodone    Reaction: GI upset  Medication List     STOP taking these medications    insulin  aspart 100 UNIT/ML FlexPen Commonly known as: NOVOLOG  Replaced by: insulin  aspart 100 UNIT/ML injection       TAKE these medications      Indication  benzonatate  100 MG capsule Commonly known as: Tessalon  Perles Take 1 capsule (100 mg total) by mouth 3 (three) times daily as needed for cough.    cloNIDine  0.1 MG tablet Commonly known as: CATAPRES  Take 1 tablet (0.1 mg total) by mouth 2 (two) times daily.    Flonase Allergy Relief 50 MCG/ACT nasal spray Generic drug: fluticasone Place into both nostrils.    insulin  aspart 100 UNIT/ML injection Commonly known as: novoLOG  Inject 0-15 Units into the skin 3 (three) times daily with meals. Replaces: insulin  aspart 100 UNIT/ML FlexPen    insulin  aspart 100 UNIT/ML injection Commonly known as: novoLOG  Inject 0-5 Units into the skin at bedtime.    insulin  glargine-yfgn 100 UNIT/ML injection Commonly known as: SEMGLEE  Inject 0.18 mLs (18 Units total) into the skin daily. Start taking on: Sep 17, 2023    ipratropium-albuterol  0.5-2.5 (3) MG/3ML Soln Commonly known as: DUONEB Take 3 mLs by nebulization every 6 (six) hours as needed.    meclizine  12.5 MG tablet Commonly known as: ANTIVERT  Take 1 tablet (12.5 mg total) by mouth 3 (three) times daily as needed for dizziness.    Metoprolol  Tartrate 37.5 MG Tabs Take 1 tablet (37.5 mg total) by mouth 2 (two) times daily.    multivitamin with minerals Tabs tablet Take 1 tablet by mouth daily. Start taking on: Sep 17, 2023    sertraline  50 MG tablet Commonly known as: ZOLOFT  Take 1 tablet (50 mg total) by mouth daily. Start taking on: Sep 17, 2023        CSW contacted [place], [place] [phone]-   CSW spoke with operator who reports that the extension is [phone]   CSW spoke  with substance use caseworker who reports she will put a note in for him to self-refer, they reach out to pt to get him scheduled following his discharge.  Follow-up recommendations:  Activity:  As tolerated    Signed: Zhanae Proffit, MD 09/16/2023, 9:41 AM

## 2023-09-16 NOTE — Plan of Care (Signed)
  Problem: Education: Goal: Knowledge of [facility] Education information/materials will improve Outcome: Progressing Goal: Emotional status will improve Outcome: Progressing Goal: Mental status will improve Outcome: Progressing Goal: Verbalization of understanding the information provided will improve Outcome: Progressing   Problem: Activity: Goal: Interest or engagement in activities will improve Outcome: Progressing Goal: Sleeping patterns will improve Outcome: Progressing   Problem: Coping: Goal: Ability to verbalize frustrations and anger appropriately will improve Outcome: Progressing Goal: Ability to demonstrate self-control will improve Outcome: Progressing   Problem: Health Behavior/Discharge Planning: Goal: Identification of resources available to assist in meeting health care needs will improve Outcome: Progressing Goal: Compliance with treatment plan for underlying cause of condition will improve Outcome: Progressing   Problem: Physical Regulation: Goal: Ability to maintain clinical measurements within normal limits will improve Outcome: Progressing   Problem: Safety: Goal: Periods of time without injury will increase Outcome: Progressing   Problem: Education: Goal: Knowledge of disease or condition will improve Outcome: Progressing Goal: Understanding of discharge needs will improve Outcome: Progressing   Problem: Health Behavior/Discharge Planning: Goal: Ability to identify changes in lifestyle to reduce recurrence of condition will improve Outcome: Progressing Goal: Identification of resources available to assist in meeting health care needs will improve Outcome: Progressing   Problem: Physical Regulation: Goal: Complications related to the disease process, condition or treatment will be avoided or minimized Outcome: Progressing   Problem: Safety: Goal: Ability to remain free from injury will improve Outcome: Progressing   Problem:  Activity: Goal: Will identify at least one activity in which they can participate Outcome: Progressing   Problem: Coping: Goal: Ability to identify and develop effective coping behavior will improve Outcome: Progressing Goal: Ability to interact with others will improve Outcome: Progressing Goal: Demonstration of participation in decision-making regarding own care will improve Outcome: Progressing Goal: Ability to use eye contact when communicating with others will improve Outcome: Progressing   Problem: Health Behavior/Discharge Planning: Goal: Identification of resources available to assist in meeting health care needs will improve Outcome: Progressing   Problem: Self-Concept: Goal: Will verbalize positive feelings about self Outcome: Progressing   Problem: Education: Goal: Ability to describe self-care measures that Lisa Milian prevent or decrease complications (Diabetes Survival Skills Education) will improve Outcome: Progressing Goal: Individualized Educational Video(s) Outcome: Progressing   Problem: Coping: Goal: Ability to adjust to condition or change in health will improve Outcome: Progressing   Problem: Fluid Volume: Goal: Ability to maintain a balanced intake and output will improve Outcome: Progressing   Problem: Health Behavior/Discharge Planning: Goal: Ability to identify and utilize available resources and services will improve Outcome: Progressing Goal: Ability to manage health-related needs will improve Outcome: Progressing   Problem: Metabolic: Goal: Ability to maintain appropriate glucose levels will improve Outcome: Progressing   Problem: Nutritional: Goal: Maintenance of adequate nutrition will improve Outcome: Progressing Goal: Progress toward achieving an optimal weight will improve Outcome: Progressing   Problem: Skin Integrity: Goal: Risk for impaired skin integrity will decrease Outcome: Progressing   Problem: Tissue Perfusion: Goal:  Adequacy of tissue perfusion will improve Outcome: Progressing

## 2023-09-16 NOTE — Progress Notes (Signed)
 Patient is pleasant and cooperative. Animated facial expression.  Denies SI/HI and AVH.  Denies anxiety and depression.  Denies pain.  Reports he slept well.    Compliant with scheduled medications.  Clonidine  held due to soft BP.  Dr. Kirk Peper aware. 15 min checks in place for safety.  Patient is present in the milieu.  Appropriate interaction with peers and staff.    Patient to discharge home today.

## 2023-11-24 ENCOUNTER — Other Ambulatory Visit: Payer: Self-pay | Admitting: Internal Medicine

## 2023-11-24 DIAGNOSIS — J449 Chronic obstructive pulmonary disease, unspecified: Secondary | ICD-10-CM

## 2023-12-02 ENCOUNTER — Ambulatory Visit: Admission: RE | Admit: 2023-12-02 | Source: Ambulatory Visit

## 2023-12-09 NOTE — Progress Notes (Deleted)
 Cardiology Clinic Note   Date: 12/09/2023 ID: Roxie, Gueye 1960-11-13, MRN [id number]  Primary Cardiologist:  Evalene Lunger, MD  Chief Complaint   [name] is a 63 y.o. male who presents to the clinic today for ***  Patient Profile   [name] is followed by Dr. Gollan for the history outlined below.      Past medical history significant for: CAD. CABG x 30 October 2012 performed at [facility]. Nuclear stress test 09/03/2022: Abnormal, potentially high risk study.  There is a large in size, mild to moderate in severity, partially reversible defect involving the inferolateral and inferior myocardium most consistent with ischemia and an element of scar.  Compared to prior study November 2020 inferolateral/inferior defect is more pronounced. Ascending aorta dilatation. Echo 05/12/2020: EF 50 to 55%.  Hypokinesis of the anteroseptal wall unable to exclude conduction abnormality versus other etiology.  Grade 1 DD.  Normal RV size/function.  Mild AI.  Aortic valve sclerosis without stenosis.  There is moderate dilatation of aortic root 45 mm, mild dilatation of ascending aorta 39 mm. Hypertension. Hyperlipidemia. Hepatitis C. COPD. T2DM. Tobacco abuse. Alcohol abuse.  In summary, patient has a history of CAD s/p CABG x 4 performed at the [facility] July 2014.  In 2020 he was evaluated in the clinic with complaints of occasional chest burning.  Stress test demonstrated a small, fixed apical defect with questionable artifact versus scar.  There was no significant ischemia.  Overall felt to be low risk and medical therapy was recommended.  Echo showed EF 60 to 65%, Grade II DD, mild to moderate dilatation of aortic root 44 mm and ascending aorta 40 mm.  Follow-up echo January 2022 demonstrated EF 50 to 55%.  CTA of the chest in March 2022 showed no evidence of aortic dissection or aneurysm.  Patient was seen in the office in May 2023 he complained of occasional  sharp and shooting chest pain lasting a few seconds to 5 minutes.  Lexiscan  was ordered but never completed.  He was seen again in follow-up in April 2024 and continued to complain of intermittent sharp chest pain at rest and with exertion lasting 2 to 3 minutes and resolving on its own.  He reported chronic stable dyspnea on exertion.  He underwent nuclear stress testing in May 2024 which was abnormal potentially high risk as detailed above.  Follow-up was arranged for further discussion of stress test results.  Patient no-showed to visit and never rescheduled.     History of Present Illness    Today, patient ***  CAD S/p CABG x 30 October 2012 performed at [facility].  Nuclear stress testing May 2024 was an abnormal potentially high risk study with large in size, mild to moderate in severity, partially reversible defect involving the inferior lateral and inferior myocardium most consistent with ischemia and an element of scar.  Patient did not follow-up.  He reports*** - Continue metoprolol ***.  Ascending aortic dilatation Echo January 2022 demonstrated low normal LV function, grade 1 DD, normal RV size/function, mild AI, moderate dilatation of aortic root 45 mm, mild dilatation of ascending aorta 39 mm.  CTA chest aorta March 2022 demonstrated no evidence of aortic aneurysm or dissection.  Patient*** -***  Hypertension BP today*** -***  Hyperlipidemia Lipid panel*** -***  Tobacco abuse Patient *** -***  Alcohol abuse Patient*** -***  ROS: All other systems reviewed and are otherwise negative except as noted in History of Present  Illness.  EKGs/Labs Reviewed        09/09/2023: ALT 78; AST 58; BUN 6; Creatinine, Ser 0.64; Potassium 3.7; Sodium 136   09/09/2023: Hemoglobin 14.7; WBC 9.6   No results found for requested labs within last 365 days.   No results found for requested labs within last 365 days.  ***  Risk Assessment/Calculations    {Does this patient have  ATRIAL FIBRILLATION?:[phone]} No BP recorded.  {Refresh Note OR Click here to enter BP  :1}***        Physical Exam    VS:  There were no vitals taken for this visit. , BMI There is no height or weight on file to calculate BMI.  GEN: Well nourished, well developed, in no acute distress. Neck: No JVD or carotid bruits. Cardiac: *** RRR. *** No murmur. No rubs or gallops.   Respiratory:  Respirations regular and unlabored. Clear to auscultation without rales, wheezing or rhonchi. GI: Soft, nontender, nondistended. Extremities: Radials/DP/PT 2+ and equal bilaterally. No clubbing or cyanosis. No edema ***  Skin: Warm and dry, no rash. Neuro: Strength intact.  Assessment & Plan   ***  Disposition: ***     {Are you ordering a CV Procedure (e.g. stress test, cath, DCCV, TEE, etc)?   Press F2        :[id number]}   Signed, Barnie HERO. Connelly [place], DNP, NP-C

## 2023-12-10 ENCOUNTER — Ambulatory Visit: Attending: Student | Admitting: Student

## 2023-12-24 ENCOUNTER — Encounter: Payer: Self-pay | Admitting: Cardiology

## 2023-12-24 ENCOUNTER — Ambulatory Visit: Attending: Cardiology | Admitting: Cardiology

## 2023-12-24 VITALS — BP 146/88 | HR 95 | Resp 17 | Ht 66.0 in | Wt 145.6 lb

## 2023-12-24 DIAGNOSIS — Z79899 Other long term (current) drug therapy: Secondary | ICD-10-CM | POA: Diagnosis not present

## 2023-12-24 DIAGNOSIS — Z951 Presence of aortocoronary bypass graft: Secondary | ICD-10-CM

## 2023-12-24 DIAGNOSIS — I25110 Atherosclerotic heart disease of native coronary artery with unstable angina pectoris: Secondary | ICD-10-CM | POA: Diagnosis not present

## 2023-12-24 DIAGNOSIS — I77810 Thoracic aortic ectasia: Secondary | ICD-10-CM

## 2023-12-24 DIAGNOSIS — Z720 Tobacco use: Secondary | ICD-10-CM

## 2023-12-24 DIAGNOSIS — E785 Hyperlipidemia, unspecified: Secondary | ICD-10-CM

## 2023-12-24 DIAGNOSIS — I10 Essential (primary) hypertension: Secondary | ICD-10-CM

## 2023-12-24 DIAGNOSIS — E118 Type 2 diabetes mellitus with unspecified complications: Secondary | ICD-10-CM

## 2023-12-24 DIAGNOSIS — F1010 Alcohol abuse, uncomplicated: Secondary | ICD-10-CM

## 2023-12-24 DIAGNOSIS — R0602 Shortness of breath: Secondary | ICD-10-CM | POA: Diagnosis not present

## 2023-12-24 MED ORDER — CLONIDINE HCL 0.1 MG PO TABS: 0.1000 mg | ORAL_TABLET | Freq: Two times a day (BID) | ORAL | 3 refills | Status: AC

## 2023-12-24 MED ORDER — METOPROLOL TARTRATE 37.5 MG PO TABS: 37.5000 mg | ORAL_TABLET | Freq: Two times a day (BID) | ORAL | 3 refills | Status: AC

## 2023-12-24 MED ORDER — ASPIRIN 81 MG PO TBEC
81.0000 mg | DELAYED_RELEASE_TABLET | Freq: Every day | ORAL | Status: AC
Start: 2023-12-24 — End: ?

## 2023-12-24 MED ORDER — NITROGLYCERIN 0.4 MG SL SUBL: 0.4000 mg | SUBLINGUAL_TABLET | SUBLINGUAL | 2 refills | Status: AC | PRN

## 2023-12-24 NOTE — Progress Notes (Signed)
 Cardiology Office Note   Date:  12/24/2023  ID:  Kyle [name] [birth year], MRN [id number] PCP: [facility] Providers Cardiologist:  Kyle Lunger, MD     History of Present Illness Kyle [name] is a 63 y.o. male with past medical history of coronary disease status post CABG x 4 (10/2012), hypertension, hyperlipidemia, type 2 diabetes, tobacco abuse, EtOH abuse, remote crack/cocaine abuse, COPD, chronic back pain, dilated aortic root, hepatitis C, who presents today for hospital follow-up.   Was evaluated in clinic in 2020 with complaints of occasional chest burning.  Stress testing was undertaken which showed a small, fixed apical defect with question of artifact versus scar.  There was no significant ischemia.  CT attenuation images showed severe three-vessel coronary calcifications with mild aortic atherosclerosis.  Overall was felt to be low risk and medical therapy was recommended.  Echocardiogram revealed an LVEF of 60 to 65%, G2 DD, with mild to moderate dilatation of the aortic root (44 mm), and ascending aorta (40 mm).  Imaging was unable to exclude a bicuspid aortic valve.  He followed up in January 2022 and echocardiogram revealed an EF of 50-55% with anterior septal hypokinesis, G1 DD, mild aortic sclerosis, and aortic root 45 mm/ascending aorta 3 mm.  CT of the chest in March 2022 reportedly showed no evidence of aortic dissection or aneurysm.  He was evaluated in May 2023 at which time reported occasional sharp midsternal chest pain lasting for few minutes.  He was planned for Lexiscan  Myoview  however he did not follow through.  He was seen in April 2024 stating he continued to have intermittent sharp chest pains with rest and with exertion.  Symptoms typically 2 to 3 minutes and resolve spontaneously.  He was again rescheduled for Lexiscan  Myoview .  Myoview  was abnormal potentially at rest some pharmacologic myocardial perfusion stress test.   It was large in size, mild to moderate in severity, partially reversible defect involving the inferolateral and inferior myocardium most consistent with ischemia and an element of scar.    He was evaluated in the [facility] emergency department 09/09/2023 for psychiatric evaluation.  He had called the veterans support line and had been using alcohol.  He was placed under IVC.  Was hospitalized until 09/16/23.  He returns to clinic today stating overall he has been feeling well from a cardiac perspective.  He states that he does have shortness of breath that has been progressive and thinks it may be slightly worse over the past 6 months and 6 months prior.  He also states that he has been waiting on the [facility] to send him his medications for about a month.  He states that the only medication that he has currently is a rescue inhaler and insulin .  He has had none of his blood pressure medications for approximately a month at that time.  He notes that he did have prior hospitalization.  He denies any chest pain or peripheral edema today.  ROS: 10 point review of systems has been reviewed and considered negative except ones been listed in the HPI  Studies Reviewed EKG Interpretation Date/Time:  Wednesday December 24 2023 08:52:48 EDT Ventricular Rate:  93 PR Interval:  198 QRS Duration:  148 QT Interval:  398 QTC Calculation: 494 R Axis:   266  Text Interpretation: Normal sinus rhythm Right bundle branch block Anterolateral infarct , age undetermined When compared with ECG of 10-Sep-2023 13:01, No significant change was found Confirmed by Tanyiah Laurich,  Necola Bluestein ([id number]) on 12/24/2023 8:56:32 AM    Lexiscan  MPI 09/03/2022   Abnormal, potentially high risk pharmacologic myocardial perfusion stress test.   There is a large in size, mild to moderate in severity, partially reversible defect involving the inferolateral and inferior myocardium most consistent with ischemia and an element of scar.   Left ventricular systolic  function is normal.   Dense coronary artery calcifications and post-CABG findings are present.   Aortic atherosclerosis and calcified right hilar lymph node are incidentally noted on the attenuation correction CT.   Compared to the prior study from 11/202/202, the inferolateral/inferior defect is more pronounced.  2D echo 05/25/20 1. Left ventricular ejection fraction, by estimation, is 50 to 55%. The  left ventricle has low normal function. The left ventricle demonstrates  regional wall motion abnormalities (hypokinesis of the anteroseptal wall,  unable to exclude conduction  abnrmality vs other etiology). Left ventricular diastolic parameters are  consistent with Grade I diastolic dysfunction (impaired relaxation).   2. Right ventricular systolic function is normal. The right ventricular  size is normal. Tricuspid regurgitation signal is inadequate for assessing  PA pressure.   3. The aortic valve was not well visualized, unable to determine number  of leaflets. Aortic valve regurgitation is mild. Mild aortic valve  sclerosis is present, with no evidence of aortic valve stenosis.   4. Aortic dilatation noted. There is moderate dilatation of the aortic  root, measuring 45 mm. There is mild dilatation of the ascending aorta,  measuring 39 mm.   Lexiscan  MPI 03/19/2019 Pharmacological myocardial perfusion imaging study with no significant  Ischemia Small fixed apical defect, unable to exclude attenuation artifact versus old scar Normal wall motion, EF estimated at 53% No EKG changes concerning for ischemia at peak stress or in recovery. Resting EKG with right bundle branch block CT attenuation corrected images with severe three-vessel coronary calcification, very mild aortic atherosclerosis History of known coronary artery disease, CABG Low risk scan  2D echo 03/12/2019 1. Left ventricular ejection fraction, by visual estimation, is 60 to  65%. The left ventricle has normal  function. There is moderately increased  left ventricular hypertrophy.   2. Left ventricular diastolic parameters are consistent with Grade II  diastolic dysfunction (pseudonormalization).   3. Global right ventricle has normal systolic function.The right  ventricular size is normal. No increase in right ventricular wall  thickness.   4. Left atrial size was normal.   5. The aortic valve was not well visualized. Unable to exclude bicuspid  aortic valve.   6. There is mild to moderate dilatation of the aortic root 44 mm and of  the ascending aorta measuring 40 mm. Aortic arch 2.7 cm   7. TR signal is inadequate for assessing pulmonary artery systolic  pressure.    Risk Assessment/Calculations     Physical Exam VS:  BP (!) 146/88 (BP Location: Left Arm, Patient Position: Sitting, Cuff Size: Normal)   Pulse 95   Resp 17   Ht 5' 6 (1.676 m)   Wt 145 lb 9.6 oz (66 kg)   SpO2 97%   BMI 23.50 kg/m        Wt Readings from Last 3 Encounters:  12/24/23 145 lb 9.6 oz (66 kg)  09/09/23 150 lb (68 kg)  09/01/23 149 lb (67.6 kg)    GEN: Well nourished, well developed in no acute distress NECK: No JVD; No carotid bruits CARDIAC: RRR, no murmurs, rubs, gallops RESPIRATORY:  Clear to auscultation without rales, wheezing or  rhonchi  ABDOMEN: Soft, non-tender, non-distended EXTREMITIES:  No edema; No deformity   ASSESSMENT AND PLAN Coronary artery disease status post CABG x 4 in 2014.  Negative Myoview  in November 2020.  Abnormal Myoview  in 09/03/2022 with large in size, mild to moderate in severity, partially reversible defect involving the inferior lateral and inferior myocardium most consistent with ischemia element of scar.  EKG today reveals sinus rhythm with a rate of 93 with a chronic right bundle branch block and an old infarct..  The patient denies any chest discomfort today but only has shortness of breath.  Discussed findings and patient would like to wait prior to doing any invasive  testing.  He has continued on aspirin  81 mg daily.  He is no longer on statin medication he is unsure of when he stopped taking it as he has been out of all of his medications for approximately a month.  ED precautions given for complaints of chest pain.  Primary hypertension with a blood pressure of 146/88.  Patient previously been on clonidine  0.1 mg twice daily and metoprolol  tartrate 37.5 mg twice daily.  He has not had either of these medications when he called the [facility] to send medication.  We have reached out to the [facility] to ensure that he gets his medication.  He has been encouraged to continue to monitor his pressures 1 to 2 hours postmedication administration.  Hyperlipidemia recent no longer on statin medication is being sent for an updated lipid panel today.  Tobacco abuse tobacco cessation continues to be recommended.  Alcohol abuse with total cessation continues to be recommended.  Type 2 diabetes remains on insulin  continues to be followed by the [facility].  Recommended that he continue to follow-up as during recent hospitalization his blood glucose was continued to remain elevated to greater than 200.  Dilated aortic root with echocardiogram in January 2022 showed an aortic root at 45 mm states an aorta 39 mm.  CT angiogram of the chest March 2022 showed no evidence of aortic aneurysm or dissection per radiologist.  Updated study pending.  Progressive shortness of breath that he states has gradually gotten worse with exertion over the last 6 months.  He has been scheduled for an updated echocardiogram.  He has been possibly looking for wall motion abnormalities and to reassess function.  If his function is decreased or he has wall motion abnormalities with previous abnormal stress testing he would likely benefit from a repeat left heart catheterization.  Patient states that after he has this study we will discuss further invasive testing at that time.       Dispo: Patient to return to clinic to  see MD/APP once testing is completed or sooner if needed for further evaluation.  Signed, Nashira Mcglynn, NP

## 2023-12-24 NOTE — Patient Instructions (Signed)
 Medication Instructions:  Your physician recommends the following medication changes.  START TAKING: Aspirin  81 mg once daily Nitroglycerin  0.4 mg as needed for chest pain Vasodilator: Nitroglycerin  is a potent vasodilator, relaxing blood vessels and improving blood flow to the heart.  Treatment for Angina: It's used to treat and prevent angina (chest pain) caused by coronary artery disease.  Take one tablet for chest pain. Wait 5 minutes. If the pain has not subsided, take another tablet. Wait 5 minutes. If the pain has not subsided, take third tablet and, call 911 or go to the emergency room.   Potential Side Effects: Common side effects of nitroglycerin  in medical applications include headache, dizziness, and low blood pressure.  Handling & Storage: Due to its explosive nature, nitroglycerin  requires careful handling and storage, away from heat, shock, and incompatible materials.  Drug Interactions: Nitroglycerin  can interact with other medications, such as certain drugs used for erectile dysfunction, potentially leading to dangerous drops in blood pressure.   *If you need a refill on your cardiac medications before your next appointment, please call your pharmacy*  Lab Work: Your provider would like for you to have following labs drawn today Lipid Panel, Liver Function Panel.   If you have labs (blood work) drawn today and your tests are completely normal, you will receive your results only by: MyChart Message (if you have MyChart) OR A paper copy in the mail If you have any lab test that is abnormal or we need to change your treatment, we will call you to review the results.  Testing/Procedures: Your physician has requested that you have an Echocardiogram. Echocardiography is a painless test that uses sound waves to create images of your heart. It provides your doctor with information about the size and shape of your heart and how well your heart's chambers and valves are working.    You may receive an ultrasound enhancing agent through an IV if needed to better visualize your heart during the echo. This procedure takes approximately one hour.  There are no restrictions for this procedure.  This will take place at [address] [facility] [address], [place] [address]  Please note: We ask at that you not bring children with you during ultrasound (echo/ vascular) testing. Due to room size and safety concerns, children are not allowed in the ultrasound rooms during exams. Our front office staff cannot provide observation of children in our lobby area while testing is being conducted. An adult accompanying a patient to their appointment will only be allowed in the ultrasound room at the discretion of the ultrasound technician under special circumstances. We apologize for any inconvenience.   Follow-Up: At [facility], you and your health needs are our priority.  As part of our continuing mission to provide you with exceptional heart care, our providers are all part of one team.  This team includes your primary Cardiologist (physician) and Advanced Practice Providers or APPs (Physician Assistants and Nurse Practitioners) who all work together to provide you with the care you need, when you need it.  Your next appointment:   3 month(s)  Provider:   Timothy Gollan, MD or Tylene Lunch, NP    We recommend signing up for the patient portal called MyChart.  Sign up information is provided on this After Visit Summary.  MyChart is used to connect with patients for Virtual Visits (Telemedicine).  Patients are able to view lab/test results, encounter notes, upcoming appointments, etc.  Non-urgent messages can be sent to  your provider as well.   To learn more about what you can do with MyChart, go to [email or web address].   Other Instructions  Refills for Metoprolol  and Clonidine  have been sent to the [facility] in [place].

## 2023-12-25 ENCOUNTER — Ambulatory Visit: Payer: Self-pay | Admitting: Cardiology

## 2023-12-25 DIAGNOSIS — Z79899 Other long term (current) drug therapy: Secondary | ICD-10-CM

## 2023-12-25 LAB — HEPATIC FUNCTION PANEL
ALT: 49 IU/L — ABNORMAL HIGH (ref 0–44)
AST: 34 IU/L (ref 0–40)
Albumin: 4.1 g/dL (ref 3.9–4.9)
Alkaline Phosphatase: 72 IU/L (ref 44–121)
Bilirubin Total: 0.7 mg/dL (ref 0.0–1.2)
Bilirubin, Direct: 0.39 mg/dL (ref 0.00–0.40)
Total Protein: 6.6 g/dL (ref 6.0–8.5)

## 2023-12-25 LAB — LIPID PANEL
Chol/HDL Ratio: 3.4 ratio (ref 0.0–5.0)
Cholesterol, Total: 134 mg/dL (ref 100–199)
HDL: 39 mg/dL — ABNORMAL LOW (ref >39)
LDL Chol Calc (NIH): 73 mg/dL (ref 0–99)
Triglycerides: 123 mg/dL (ref 0–149)
VLDL Cholesterol Cal: 22 mg/dL (ref 5–40)

## 2023-12-25 NOTE — Progress Notes (Signed)
 Cholesterol has improved, liver functions have remained stable.  LDL or bad cholesterol still remains above goal of 70 or less ideally 55 or less.  Recommend restarting statin therapy of rosuvastatin  10 mg daily with repeat lipid and hepatic panel in 3 months.

## 2023-12-30 MED ORDER — ROSUVASTATIN CALCIUM 10 MG PO TABS: 10.0000 mg | ORAL_TABLET | Freq: Every day | ORAL | 3 refills | Status: AC

## 2024-01-19 ENCOUNTER — Ambulatory Visit: Admitting: Cardiovascular Disease

## 2024-01-20 ENCOUNTER — Emergency Department

## 2024-01-20 ENCOUNTER — Emergency Department
Admission: EM | Admit: 2024-01-20 | Discharge: 2024-01-20 | Disposition: A | Discharge status: Home / Self Care | Attending: Emergency Medicine | Admitting: Emergency Medicine

## 2024-01-20 ENCOUNTER — Other Ambulatory Visit: Payer: Self-pay

## 2024-01-20 DIAGNOSIS — J4489 Other specified chronic obstructive pulmonary disease: Secondary | ICD-10-CM | POA: Diagnosis not present

## 2024-01-20 DIAGNOSIS — E119 Type 2 diabetes mellitus without complications: Secondary | ICD-10-CM | POA: Diagnosis not present

## 2024-01-20 DIAGNOSIS — I10 Essential (primary) hypertension: Secondary | ICD-10-CM | POA: Diagnosis not present

## 2024-01-20 DIAGNOSIS — R059 Cough, unspecified: Secondary | ICD-10-CM | POA: Diagnosis present

## 2024-01-20 DIAGNOSIS — J069 Acute upper respiratory infection, unspecified: Secondary | ICD-10-CM | POA: Diagnosis not present

## 2024-01-20 LAB — RESP PANEL BY RT-PCR (RSV, FLU A&B, COVID)  RVPGX2
Influenza A by PCR: NEGATIVE
Influenza B by PCR: NEGATIVE
Resp Syncytial Virus by PCR: NEGATIVE
SARS Coronavirus 2 by RT PCR: NEGATIVE

## 2024-01-20 MED ORDER — IPRATROPIUM-ALBUTEROL 0.5-2.5 (3) MG/3ML IN SOLN
3.0000 mL | Freq: Once | RESPIRATORY_TRACT | Status: AC
Start: 2024-01-20 — End: 2024-01-20
  Administered 2024-01-20: 3 mL via RESPIRATORY_TRACT
  Filled 2024-01-20: qty 3

## 2024-01-20 MED ORDER — AZITHROMYCIN 250 MG PO TABS: ORAL_TABLET | ORAL | 0 refills | Status: AC

## 2024-01-20 NOTE — Discharge Instructions (Signed)
 You were seen in the emergency department today for possible sinus infection respiratory panel which includes COVID, influenza and RSV is pending.  If these results are positive you will receive a call.  Take tylenol  or ibuprofen  for pain or fever as directed.   Stay hydrated by drinking plenty of fluids to thin mucus. Get adequate amount of sleep and avoid overexertion. Consider a humidifier at night. Warm teas and a spoonful of honey may help reduce cough frequency. Follow up with your primary care provider as needed.   SORE THROAT  Use throat lozenges or Chloraseptic spray.  Gargle with warm salt water several times daily

## 2024-01-20 NOTE — ED Triage Notes (Signed)
 Pt sts that he thinks that he has a sinus infection for the last week and needs to be seen.

## 2024-01-20 NOTE — ED Provider Notes (Signed)
 [facility] Emergency Department Provider Note     Event Date/Time   First MD Initiated Contact with Patient 01/20/24 1634     (approximate)   History   URI   HPI  Kyle [name] is a 63 y.o. male with a past medical history of asthma, diabetes, HTN, COPD, GERD and allergies presents to the ED for evaluation of nonproductive cough x 1 week.  Patient reports he was mowing his lawn a week ago when he noticed a cough.  He believes it is a sinus infection.  He denies fever.  He has tried his allergy medication and albuterol  inhaler with no relief.  Symptoms include sneezing.  Denies chest pain.  Endorses intermittent shortness of breath with coughing.  No other complaints time.    Physical Exam   Triage Vital Signs: ED Triage Vitals  Encounter Vitals Group     BP 01/20/24 1424 (!) 142/85     Girls Systolic BP Percentile --      Girls Diastolic BP Percentile --      Boys Systolic BP Percentile --      Boys Diastolic BP Percentile --      Pulse Rate 01/20/24 1424 85     Resp 01/20/24 1424 17     Temp 01/20/24 1424 98.3 F (36.8 C)     Temp Source 01/20/24 1424 Oral     SpO2 01/20/24 1424 100 %     Weight 01/20/24 1425 149 lb (67.6 kg)     Height 01/20/24 1425 5' 7 (1.702 m)     Head Circumference --      Peak Flow --      Pain Score 01/20/24 1425 7     Pain Loc --      Pain Education --      Exclude from Growth Chart --     Most recent vital signs: Vitals:   01/20/24 1424 01/20/24 1842  BP: (!) 142/85 133/74  Pulse: 85 80  Resp: 17 18  Temp: 98.3 F (36.8 C)   SpO2: 100% 100%   General: Well appearing and comfortable. Alert and oriented. INAD.  Speaking in complete sentences. Skin:  Warm, dry and intact. No rashes or lesions noted.     Head:  NCAT. Nontender over frontal and maxillary sinus with percussion.  Eyes:  PERRLA. EOMI.  Ears:  EACs patent. Tympanic membranes clear bilaterally. No bulging, erythema or discharge.  Nose:    Mucosa is moist. No rhinorrhea. Throat: Oropharynx clear. No erythema or exudates. Tonsils not enlarged. Uvula is midline. CV:  Good peripheral perfusion. RRR.  RESP:  Normal effort. LCTAB. No retractions.  ABD:  No distention.   ED Results / Procedures / Treatments   Labs (all labs ordered are listed, but only abnormal results are displayed) Labs Reviewed  RESP PANEL BY RT-PCR (RSV, FLU A&B, COVID)  [id number]   RADIOLOGY  I personally viewed and evaluated these images as part of my medical decision making, as well as reviewing the written report by the radiologist.  ED Provider Interpretation: Normal-appearing chest x-ray.  No focal consolidation.  DG Chest 1 View Result Date: 01/20/2024 CLINICAL DATA:  Acute cough EXAM: CHEST  1 VIEW COMPARISON:  09/01/2023 FINDINGS: Postoperative changes in the mediastinum. Heart size and pulmonary vascularity are normal. Lungs are clear. No pleural effusion or pneumothorax. Mediastinal contours appear intact. Degenerative changes in the spine and shoulders. IMPRESSION: No active disease. Electronically Signed   By: Elsie  Mannie M.D.   On: 01/20/2024 17:16    PROCEDURES:  Critical Care performed: No  Procedures   MEDICATIONS ORDERED IN ED: Medications  ipratropium-albuterol  (DUONEB) 0.5-2.5 (3) MG/3ML nebulizer solution 3 mL (3 mLs Nebulization Given 01/20/24 1656)     IMPRESSION / MDM / ASSESSMENT AND PLAN / ED COURSE  I reviewed the triage vital signs and the nursing notes.                             Clinical Course as of 01/20/24 1846  Tue Jan 20, 2024  1742 DG Chest 1 View IMPRESSION: No active disease.   [MH]    Clinical Course User Index [MH] Margrette Monte A, PA-C   63 y.o. male presents to the emergency department for evaluation and treatment of acute cough. See HPI for further details.   Differential diagnosis includes, but is not limited to sinusitis, allergies, viral URI, PNA  Patient's presentation is most  consistent with acute complicated illness / injury requiring diagnostic workup.  Patient is alert and oriented.  He is hemodynamic stable and afebrile.  Physical exam findings are stated above and overall reassuring.  Normal lung exam.  Plan will be to obtain respiratory panel, chest x-ray and administer DuoNeb.  I suspect if workup is negative patient will be in stable condition for discharge home.  ----------------------------------------- 6:45 PM on 01/20/2024 -----------------------------------------  Respiratory panel is pending.  Will not change treatment plan.  If results are positive, patient will receive a call with these results.  Chest x-ray read reassuring.  Otherwise, he is in stable condition for discharge home.  Antibiotic sent to pharmacy for secondary bacterial infection coverage.  Patient advised to follow-up with primary care provider in 1 week.   FINAL CLINICAL IMPRESSION(S) / ED DIAGNOSES   Final diagnoses:  Upper respiratory tract infection, unspecified type   Rx / DC Orders   ED Discharge Orders          Ordered    azithromycin  (ZITHROMAX  Z-PAK) 250 MG tablet        01/20/24 1839             Note:  This document was prepared using Dragon voice recognition software and may include unintentional dictation errors.    Margrette, Bonne Whack A, PA-C 01/20/24 1846    Dorothyann Drivers, MD 01/20/24 2111

## 2024-01-28 ENCOUNTER — Emergency Department
Admission: EM | Admit: 2024-01-28 | Discharge: 2024-01-28 | Disposition: A | Discharge status: Home / Self Care | Attending: Emergency Medicine | Admitting: Emergency Medicine

## 2024-01-28 ENCOUNTER — Other Ambulatory Visit: Payer: Self-pay

## 2024-01-28 DIAGNOSIS — E162 Hypoglycemia, unspecified: Secondary | ICD-10-CM | POA: Insufficient documentation

## 2024-01-28 DIAGNOSIS — J449 Chronic obstructive pulmonary disease, unspecified: Secondary | ICD-10-CM | POA: Diagnosis not present

## 2024-01-28 DIAGNOSIS — Z794 Long term (current) use of insulin: Secondary | ICD-10-CM | POA: Diagnosis not present

## 2024-01-28 DIAGNOSIS — R42 Dizziness and giddiness: Secondary | ICD-10-CM | POA: Insufficient documentation

## 2024-01-28 DIAGNOSIS — I2510 Atherosclerotic heart disease of native coronary artery without angina pectoris: Secondary | ICD-10-CM | POA: Insufficient documentation

## 2024-01-28 LAB — CBC
HCT: 47.6 % (ref 39.0–52.0)
Hemoglobin: 14.9 g/dL (ref 13.0–17.0)
MCH: 26.8 pg (ref 26.0–34.0)
MCHC: 31.3 g/dL (ref 30.0–36.0)
MCV: 85.5 fL (ref 80.0–100.0)
Platelets: 111 K/uL — ABNORMAL LOW (ref 150–400)
RBC: 5.57 MIL/uL (ref 4.22–5.81)
RDW: 14.4 % (ref 11.5–15.5)
WBC: 8.7 K/uL (ref 4.0–10.5)
nRBC: 0 % (ref 0.0–0.2)

## 2024-01-28 LAB — COMPREHENSIVE METABOLIC PANEL WITH GFR
ALT: 38 U/L (ref 0–44)
AST: 28 U/L (ref 15–41)
Albumin: 3.3 g/dL — ABNORMAL LOW (ref 3.5–5.0)
Alkaline Phosphatase: 44 U/L (ref 38–126)
Anion gap: 13 (ref 5–15)
BUN: 11 mg/dL (ref 8–23)
CO2: 25 mmol/L (ref 22–32)
Calcium: 9.6 mg/dL (ref 8.9–10.3)
Chloride: 102 mmol/L (ref 98–111)
Creatinine, Ser: 0.71 mg/dL (ref 0.61–1.24)
GFR, Estimated: >60 mL/min (ref >60)
Glucose, Bld: 78 mg/dL (ref 70–99)
Potassium: 4.6 mmol/L (ref 3.5–5.1)
Sodium: 140 mmol/L (ref 135–145)
Total Bilirubin: 0.7 mg/dL (ref 0.0–1.2)
Total Protein: 6.3 g/dL — ABNORMAL LOW (ref 6.5–8.1)

## 2024-01-28 NOTE — ED Notes (Signed)
 Patient given turkey sandwich bag and orange juice

## 2024-01-28 NOTE — ED Triage Notes (Signed)
 Patient states dizziness since this morning; reports his blood sugar was 68 so he ate a spoonful of sugar and currently blood glucose is 91.

## 2024-01-28 NOTE — ED Provider Notes (Signed)
 [facility] Provider Note    Event Date/Time   First MD Initiated Contact with Patient 01/28/24 0827     (approximate)  History   Chief Complaint: Dizziness  HPI  Kyle [name] is a 63 y.o. male with a past medical history of COPD, CAD, gastric reflux, presents to the emergency department for low blood sugar.  According to the patient this morning he got an alert from his continuous glucose monitor that his blood sugar dropped into the 60s.  Patient states he was feeling somewhat weak and dizzy so he ate a spoonful of sugar.  Patient states he felt better after doing this and his blood sugar came back up.  Patient denies any symptoms currently.  Physical Exam   Triage Vital Signs: ED Triage Vitals  Encounter Vitals Group     BP 01/28/24 0815 (!) 161/115     Girls Systolic BP Percentile --      Girls Diastolic BP Percentile --      Boys Systolic BP Percentile --      Boys Diastolic BP Percentile --      Pulse Rate 01/28/24 0815 79     Resp 01/28/24 0815 18     Temp 01/28/24 0816 97.8 F (36.6 C)     Temp Source 01/28/24 0816 Oral     SpO2 01/28/24 0815 100 %     Weight --      Height --      Head Circumference --      Peak Flow --      Pain Score --      Pain Loc --      Pain Education --      Exclude from Growth Chart --     Most recent vital signs: Vitals:   01/28/24 0815 01/28/24 0816  BP: (!) 161/115   Pulse: 79   Resp: 18   Temp:  97.8 F (36.6 C)  SpO2: 100%     General: Awake, no distress.  CV:  Good peripheral perfusion.  Regular rate and rhythm  Resp:  Normal effort.  Equal breath sounds bilaterally.  Abd:  No distention.  Soft, nontender.  No rebound or guarding.  ED Results / Procedures / Treatments   MEDICATIONS ORDERED IN ED: Medications - No data to display   IMPRESSION / MDM / ASSESSMENT AND PLAN / ED COURSE  I reviewed the triage vital signs and the nursing notes.  Patient's presentation is most consistent  with acute presentation with potential threat to life or bodily function.  Patient presents to the emergency department for an episode of low blood sugar and dizziness.  Patient states his continuous blood glucose monitor alerted him that his blood sugar dropped into the 60s.  He states he was feeling a little dizzy so he ate some sugar and then felt better.  Patient states majority of his symptoms have resolved besides feeling slightly dizzy/weak.  We will check labs and continue to closely monitor.  We will feed the patient.  He states he took insulin  this morning and only ate 1 piece of toast.  Patient's lab work is reassuring with a reassuring CBC.  Chemistry is reassuring blood glucose of 78.  On the patient's monitor blood glucose is currently 187 after the patient ate breakfast.  Patient has no symptoms.  Given his reassuring workup we will discharge patient home with outpatient follow-up.  Patient agreeable to plan.  FINAL CLINICAL IMPRESSION(S) / ED DIAGNOSES  Hypoglycemia Dizziness  Note:  This document was prepared using Dragon voice recognition software and may include unintentional dictation errors.   Dorothyann Drivers, MD 01/28/24 1008

## 2024-02-03 ENCOUNTER — Ambulatory Visit

## 2024-02-05 ENCOUNTER — Emergency Department
Admission: EM | Admit: 2024-02-05 | Discharge: 2024-02-05 | Disposition: A | Discharge status: Home / Self Care | Source: Ambulatory Visit

## 2024-02-05 ENCOUNTER — Other Ambulatory Visit: Payer: Self-pay

## 2024-02-05 ENCOUNTER — Encounter: Payer: Self-pay | Admitting: Intensive Care

## 2024-02-05 DIAGNOSIS — Z951 Presence of aortocoronary bypass graft: Secondary | ICD-10-CM | POA: Insufficient documentation

## 2024-02-05 DIAGNOSIS — Z91148 Patient's other noncompliance with medication regimen for other reason: Secondary | ICD-10-CM | POA: Insufficient documentation

## 2024-02-05 DIAGNOSIS — R739 Hyperglycemia, unspecified: Secondary | ICD-10-CM

## 2024-02-05 DIAGNOSIS — E119 Type 2 diabetes mellitus without complications: Secondary | ICD-10-CM | POA: Diagnosis not present

## 2024-02-05 DIAGNOSIS — J4489 Other specified chronic obstructive pulmonary disease: Secondary | ICD-10-CM | POA: Insufficient documentation

## 2024-02-05 DIAGNOSIS — E1165 Type 2 diabetes mellitus with hyperglycemia: Secondary | ICD-10-CM | POA: Insufficient documentation

## 2024-02-05 DIAGNOSIS — I2510 Atherosclerotic heart disease of native coronary artery without angina pectoris: Secondary | ICD-10-CM | POA: Diagnosis not present

## 2024-02-05 DIAGNOSIS — I10 Essential (primary) hypertension: Secondary | ICD-10-CM | POA: Diagnosis not present

## 2024-02-05 LAB — CBC
HCT: 48.2 % (ref 39.0–52.0)
Hemoglobin: 15.1 g/dL (ref 13.0–17.0)
MCH: 27 pg (ref 26.0–34.0)
MCHC: 31.3 g/dL (ref 30.0–36.0)
MCV: 86.1 fL (ref 80.0–100.0)
Platelets: 136 K/uL — ABNORMAL LOW (ref 150–400)
RBC: 5.6 MIL/uL (ref 4.22–5.81)
RDW: 14.1 % (ref 11.5–15.5)
WBC: 7.4 K/uL (ref 4.0–10.5)
nRBC: 0 % (ref 0.0–0.2)

## 2024-02-05 LAB — URINALYSIS, ROUTINE W REFLEX MICROSCOPIC
Bacteria, UA: NONE SEEN
Bilirubin Urine: NEGATIVE
Glucose, UA: >=500 mg/dL — AB
Hgb urine dipstick: NEGATIVE
Ketones, ur: NEGATIVE mg/dL
Leukocytes,Ua: NEGATIVE
Nitrite: NEGATIVE
Protein, ur: NEGATIVE mg/dL
RBC / HPF: 0 RBC/hpf (ref 0–5)
Specific Gravity, Urine: 1.012 (ref 1.005–1.030)
Squamous Epithelial / HPF: 0 /HPF (ref 0–5)
pH: 6 (ref 5.0–8.0)

## 2024-02-05 LAB — BASIC METABOLIC PANEL WITH GFR
Anion gap: 9 (ref 5–15)
BUN: 14 mg/dL (ref 8–23)
CO2: 25 mmol/L (ref 22–32)
Calcium: 9.1 mg/dL (ref 8.9–10.3)
Chloride: 102 mmol/L (ref 98–111)
Creatinine, Ser: 0.69 mg/dL (ref 0.61–1.24)
GFR, Estimated: >60 mL/min (ref >60)
Glucose, Bld: 326 mg/dL — ABNORMAL HIGH (ref 70–99)
Potassium: 3.8 mmol/L (ref 3.5–5.1)
Sodium: 136 mmol/L (ref 135–145)

## 2024-02-05 NOTE — Discharge Instructions (Addendum)
 Your evaluation in the emergency department was overall reassuring.  Though your initial blood sugar was notably elevated, this is fortunately downtrending.  Please do take all of your diabetes medications as already prescribed and follow-up with your primary care provider for reevaluation.  Return to the emergency department with any new or worsening symptoms.

## 2024-02-05 NOTE — ED Triage Notes (Signed)
 Patient c/o hyperglycemia. Reports blood sugar has been running around 300s. C/o dizziness and nausea

## 2024-02-05 NOTE — ED Provider Notes (Signed)
 [facility] Provider Note    Event Date/Time   First MD Initiated Contact with Patient 02/05/24 1819     (approximate)   History   Hyperglycemia  Patient c/o hyperglycemia. Reports blood sugar has been running around 300s. C/o dizziness and nausea   HPI Kyle [name] is a 63 y.o. male PMH asthma, COPD, CAD, DM 2, hepatitis C, hyperlipidemia, CAD with prior CABG x 4, polysubstance use, hypertension presents for evaluation of elevated blood sugars - Patient has not taken his insulin  over the past 2 days and noticed his blood sugar in the 300s today so decided come to emergency department for eval - Denies any physical complaints including nausea/vomiting/diarrhea, fever, other infectious symptoms, abdominal pain - By time of my evaluation, notes his meter is reading in the 262 - Has follow-up with his primary care provider next week  Per chart review, last seen in our emergency department on 01/28/2024 complaining of dizziness.  Noted to have a blood sugar to drop to the 60s.  Unremarkable workup, discharged home.     Physical Exam   Triage Vital Signs: ED Triage Vitals  Encounter Vitals Group     BP 02/05/24 1632 (!) 188/109     Girls Systolic BP Percentile --      Girls Diastolic BP Percentile --      Boys Systolic BP Percentile --      Boys Diastolic BP Percentile --      Pulse Rate 02/05/24 1632 92     Resp 02/05/24 1632 15     Temp 02/05/24 1632 98.1 F (36.7 C)     Temp Source 02/05/24 1632 Oral     SpO2 02/05/24 1632 100 %     Weight 02/05/24 1633 150 lb (68 kg)     Height 02/05/24 1633 5' 7 (1.702 m)     Head Circumference --      Peak Flow --      Pain Score 02/05/24 1633 0     Pain Loc --      Pain Education --      Exclude from Growth Chart --     Most recent vital signs: Vitals:   02/05/24 1632  BP: (!) 188/109  Pulse: 92  Resp: 15  Temp: 98.1 F (36.7 C)  SpO2: 100%     General: Awake, no distress.  CV:  Good  peripheral perfusion. RRR, RP 2+ Resp:  Normal effort. CTAB Abd:  No distention. Nontender to deep palpation throughout    ED Results / Procedures / Treatments   Labs (all labs ordered are listed, but only abnormal results are displayed) Labs Reviewed  BASIC METABOLIC PANEL WITH GFR - Abnormal; Notable for the following components:      Result Value   Glucose, Bld 326 (*)    All other components within normal limits  CBC - Abnormal; Notable for the following components:   Platelets 136 (*)    All other components within normal limits  URINALYSIS, ROUTINE W REFLEX MICROSCOPIC - Abnormal; Notable for the following components:   Color, Urine YELLOW (*)    APPearance CLEAR (*)    Glucose, UA >=500 (*)    All other components within normal limits     EKG  N/a   RADIOLOGY N/a    PROCEDURES:  Critical Care performed: No  Procedures   MEDICATIONS ORDERED IN ED: Medications - No data to display   IMPRESSION / MDM / ASSESSMENT AND PLAN /  ED COURSE  I reviewed the triage vital signs and the nursing notes.                              DDX/MDM/AP: Differential diagnosis includes, but is not limited to, hyperglycemia in the setting of diabetes medication nonadherence.  No evidence of underlying infection at this time.  Consider possibility of DKA/HHS.  Plan: - Labs collected at triage--hyperglycemia though no evidence of DKA/HHS - Offered IV fluid which patient declined  Patient's presentation is most consistent with acute presentation with potential threat to life or bodily function.  ED course below.  Workup overall reassuring, and no evidence of metabolic emergency at this time, patient has been nonadherent to his diabetes regimen, counseled him on the importance of this.  At the time of my evaluation, his blood sugar had downtrended to 62 on his Dexcom.  Offered IV fluid which she declined.  Confirms that he does have his diabetes medications at home and will start  taking them, also has PMD follow-up this coming week.  ED return precautions in place.  Patient agrees with plan.  Discharged home.  Clinical Course as of 02/05/24 1854  Thu Feb 05, 2024  1835 BMP with hyperglycemia to 326, anion gap normal, bicarb normal, not consistent with DKA/HHS  CBC reviewed, unremarkable  Urinalysis with no evidence of infection, no ketones [MM]    Clinical Course User Index [MM] Clarine Ozell LABOR, MD     FINAL CLINICAL IMPRESSION(S) / ED DIAGNOSES   Final diagnoses:  Hyperglycemia  Nonadherence to medication     Rx / DC Orders   ED Discharge Orders     None        Note:  This document was prepared using Dragon voice recognition software and may include unintentional dictation errors.   Clarine Ozell LABOR, MD 02/05/24 [phone]

## 2024-02-05 NOTE — ED Notes (Signed)
 See triage note  Presents with some nausea and dizziness   State snoticed that his blood sugars were elevated

## 2024-03-16 ENCOUNTER — Ambulatory Visit: Attending: Cardiology

## 2024-03-21 ENCOUNTER — Other Ambulatory Visit: Payer: Self-pay

## 2024-03-21 ENCOUNTER — Emergency Department
Admission: EM | Admit: 2024-03-21 | Discharge: 2024-03-21 | Disposition: A | Discharge status: Home / Self Care | Attending: Emergency Medicine | Admitting: Emergency Medicine

## 2024-03-21 DIAGNOSIS — J4489 Other specified chronic obstructive pulmonary disease: Secondary | ICD-10-CM | POA: Insufficient documentation

## 2024-03-21 DIAGNOSIS — E11649 Type 2 diabetes mellitus with hypoglycemia without coma: Secondary | ICD-10-CM | POA: Insufficient documentation

## 2024-03-21 DIAGNOSIS — I10 Essential (primary) hypertension: Secondary | ICD-10-CM | POA: Insufficient documentation

## 2024-03-21 DIAGNOSIS — E160 Drug-induced hypoglycemia without coma: Secondary | ICD-10-CM

## 2024-03-21 LAB — CBC
HCT: 44.9 % (ref 39.0–52.0)
Hemoglobin: 14.4 g/dL (ref 13.0–17.0)
MCH: 27.6 pg (ref 26.0–34.0)
MCHC: 32.1 g/dL (ref 30.0–36.0)
MCV: 86 fL (ref 80.0–100.0)
Platelets: 139 K/uL — ABNORMAL LOW (ref 150–400)
RBC: 5.22 MIL/uL (ref 4.22–5.81)
RDW: 13 % (ref 11.5–15.5)
WBC: 8.2 K/uL (ref 4.0–10.5)
nRBC: 0 % (ref 0.0–0.2)

## 2024-03-21 LAB — URINALYSIS, ROUTINE W REFLEX MICROSCOPIC
Bilirubin Urine: NEGATIVE
Glucose, UA: 50 mg/dL — AB
Hgb urine dipstick: NEGATIVE
Ketones, ur: NEGATIVE mg/dL
Leukocytes,Ua: NEGATIVE
Nitrite: NEGATIVE
Protein, ur: NEGATIVE mg/dL
Specific Gravity, Urine: 1.013 (ref 1.005–1.030)
pH: 5 (ref 5.0–8.0)

## 2024-03-21 LAB — BASIC METABOLIC PANEL WITH GFR
Anion gap: 9 (ref 5–15)
BUN: 17 mg/dL (ref 8–23)
CO2: 24 mmol/L (ref 22–32)
Calcium: 9.1 mg/dL (ref 8.9–10.3)
Chloride: 102 mmol/L (ref 98–111)
Creatinine, Ser: 0.73 mg/dL (ref 0.61–1.24)
GFR, Estimated: >60 mL/min (ref >60)
Glucose, Bld: 232 mg/dL — ABNORMAL HIGH (ref 70–99)
Potassium: 4 mmol/L (ref 3.5–5.1)
Sodium: 135 mmol/L (ref 135–145)

## 2024-03-21 LAB — CBG MONITORING, ED: Glucose-Capillary: 243 mg/dL — ABNORMAL HIGH (ref 70–99)

## 2024-03-21 NOTE — Discharge Instructions (Addendum)
 Please use your long-acting insulin  (insulin  glargine, Semglee ) as was previously prescribed before the recent change.  If your daily blood sugars are still going below 80, please decrease your short acting insulin  (insulin  aspart, NovoLog ) as well.  Please follow-up with your primary care physician regarding further management of these medications.

## 2024-03-21 NOTE — ED Provider Notes (Signed)
 [facility] Provider Note   Event Date/Time   First MD Initiated Contact with Patient 03/21/24 [phone]     (approximate) History  Hypoglycemia  HPI Kyle [name] is a 63 y.o. male with a stated past medical history of type 2 diabetes, hypertension, hepatitis C, asthma, and COPD who presents complaining of of hypoglycemia over the last few days after being told to increase his glucose due to hyperglycemia.  Patient states that he has noticed his blood sugar down in the 60s and associated diaphoresis and nausea until he eats something.  Patient has a glucose monitor and states that it has been running in the 60s to 80s with his new insulin  regimen.  Patient was not sure whether he could decrease his insulin  without speaking to a physician. ROS: Patient currently denies any vision changes, tinnitus, difficulty speaking, facial droop, sore throat, chest pain, shortness of breath, abdominal pain, nausea/vomiting/diarrhea, dysuria, or weakness/numbness/paresthesias in any extremity   Physical Exam  Triage Vital Signs: ED Triage Vitals [03/21/24 0803]  Encounter Vitals Group     BP (!) 144/88     Girls Systolic BP Percentile      Girls Diastolic BP Percentile      Boys Systolic BP Percentile      Boys Diastolic BP Percentile      Pulse Rate (!) 102     Resp 18     Temp 98 F (36.7 C)     Temp src      SpO2 99 %     Weight 148 lb (67.1 kg)     Height 5' 7 (1.702 m)     Head Circumference      Peak Flow      Pain Score 0     Pain Loc      Pain Education      Exclude from Growth Chart    Most recent vital signs: Vitals:   03/21/24 0803 03/21/24 0930  BP: (!) 144/88 (!) 165/94  Pulse: (!) 102 89  Resp: 18 16  Temp: 98 F (36.7 C)   SpO2: 99% 97%   General: Awake, oriented x4. CV:  Good peripheral perfusion. Resp:  Normal effort. Abd:  No distention. Other:  Elderly well-developed, well-nourished African-American male resting comfortably in no acute  distress ED Results / Procedures / Treatments  Labs (all labs ordered are listed, but only abnormal results are displayed) Labs Reviewed  CBC - Abnormal; Notable for the following components:      Result Value   Platelets 139 (*)    All other components within normal limits  BASIC METABOLIC PANEL WITH GFR - Abnormal; Notable for the following components:   Glucose, Bld 232 (*)    All other components within normal limits  URINALYSIS, ROUTINE W REFLEX MICROSCOPIC - Abnormal; Notable for the following components:   Color, Urine YELLOW (*)    APPearance CLEAR (*)    Glucose, UA 50 (*)    All other components within normal limits  CBG MONITORING, ED - Abnormal; Notable for the following components:   Glucose-Capillary 243 (*)    All other components within normal limits  PROCEDURES: Critical Care performed: No Procedures MEDICATIONS ORDERED IN ED: Medications - No data to display IMPRESSION / MDM / ASSESSMENT AND PLAN / ED COURSE  I reviewed the triage vital signs and the nursing notes.  The patient is on the cardiac monitor to evaluate for evidence of arrhythmia and/or significant heart rate changes. Patient's presentation is most consistent with acute presentation with potential threat to life or bodily function. Patient is a 63 year old male with the above-stated past medical history presents complaining of intermittent episodes of hypoglycemia due to increased insulin  regimen that was prescribed recently. DDx: Hypoglycemia secondary to exogenous insulin , insulin  secreting tumor, decreased p.o. intake, URI Plan: CBC, BMP, UA, CBG  Laboratory evaluation significant for glucose of 232 at this time.  Patient states that he has not had his insulin  yet today.  I discussed patient's insulin  regimen including recent changes.  Will plan for patient to decrease his long-acting insulin  at first for the next 24 hours and monitor his glucose levels.  If patient's  glucose maintains or dips below 80, patient was instructed to decrease his short acting insulin  back to the previous dose before it was changed as well.  Patient also encouraged and agrees to follow-up with their primary care physician within the next few days for further management of these antihyperglycemic's.  Patient given strict return precautions and all questions answered prior to discharge  Dispo: Discharge home with PCP follow-up   FINAL CLINICAL IMPRESSION(S) / ED DIAGNOSES   Final diagnoses:  Hypoglycemia due to insulin    Rx / DC Orders   ED Discharge Orders     None      Note:  This document was prepared using Dragon voice recognition software and may include unintentional dictation errors.   Roel Douthat K, MD 03/21/24 [phone]

## 2024-03-21 NOTE — ED Triage Notes (Signed)
 Pt comes with c/o hypoglycemia. Pt states sometimes in the 60s and then goes up to 100s. Pt states this has been going on for two days. Pt takes insulin . Pt did not take meds today.

## 2024-03-23 ENCOUNTER — Ambulatory Visit: Attending: Cardiology | Admitting: Cardiology

## 2024-03-23 NOTE — Progress Notes (Deleted)
 Cardiology Office Note   Date:  03/23/2024  ID:  Kyle [name], Kyle [name] 1960/08/06, MRN [id number] PCP: [facility]  [facility] Providers Cardiologist:  Evalene Lunger, MD { Click to update primary MD,subspecialty MD or APP then REFRESH:1}    History of Present Illness Kyle [name] is a 63 y.o. male with past medical history of coronary disease status post CABG x 4 (10/2012), hypertension, hyperlipidemia, type 2 diabetes, tobacco use, EtOH abuse, remote crack/cocaine abuse, COPD, chronic back pain, dilated aortic root, hepatitis C, presents today for hospital follow-up.   Was evaluated in clinic in 2020 with complaints of occasional chest burning.  Stress testing was undertaken which showed a small, fixed apical defect with question of artifact versus scar.  There was no significant ischemia.  CT attenuation images showed severe three-vessel coronary calcifications with mild aortic atherosclerosis.  Overall was felt to be low risk and medical therapy was recommended.  Echocardiogram revealed an LVEF of 60 to 65%, G2 DD, with mild to moderate dilatation of the aortic root (44 mm), and ascending aorta (40 mm).  Imaging was unable to exclude a bicuspid aortic valve.  He followed up in January 2022 and echocardiogram revealed an EF of 50-55% with anterior septal hypokinesis, G1 DD, mild aortic sclerosis, and aortic root 45 mm/ascending aorta 3 mm.  CT of the chest in March 2022 reportedly showed no evidence of aortic dissection or aneurysm.  He was evaluated in May 2023 at which time reported occasional sharp midsternal chest pain lasting for few minutes.  He was planned for Lexiscan  Myoview  however he did not follow through.  He was seen in April 2024 stating he continued to have intermittent sharp chest pains with rest and with exertion.  Symptoms typically 2 to 3 minutes and resolve spontaneously.  He was again rescheduled for Lexiscan  Myoview .  Myoview  was abnormal  potentially at rest some pharmacologic myocardial perfusion stress test.  It was large in size, mild to moderate in severity, partially reversible defect involving the inferolateral and inferior myocardium most consistent with ischemia and an element of scar.   He was evaluated in [facility] emergency department 09/09/2023 for psychiatric evaluation.  He was placed under IVC and was hospitalized 09/16/2023.  He was seen in clinic 12/24/2023 stating overall he had been doing well from a cardiac perspective.  He had some shortness of breath that had been progressive but he thinks it is slightly worse over the past 6 months.  He stated been weight low.  He does not take medications for about a month.  Patient previously had an abnormal Myoview  back in May 2024 but declined any further evaluation.  He was scheduled for an updated echocardiogram.  Unfortunately study was never completed.  Patient has had multiple emergency department visits since his last visit from upper respiratory infection, dizziness, hyperglycemia and hypoglycemia.  His last visit to the emergency department 03/21/2024.   He returns to clinic today  ROS: 10 point review of system has been reviewed and considered negative exception was been listed in the HPI  Studies Reviewed      Lexiscan  MPI 09/03/2022   Abnormal, potentially high risk pharmacologic myocardial perfusion stress test.   There is a large in size, mild to moderate in severity, partially reversible defect involving the inferolateral and inferior myocardium most consistent with ischemia and an element of scar.   Left ventricular systolic function is normal.   Dense coronary artery calcifications and post-CABG findings are present.  Aortic atherosclerosis and calcified right hilar lymph node are incidentally noted on the attenuation correction CT.   Compared to the prior study from 11/202/202, the inferolateral/inferior defect is more pronounced.   2D echo Jun 06, 2020 1. Left  ventricular ejection fraction, by estimation, is 50 to 55%. The  left ventricle has low normal function. The left ventricle demonstrates  regional wall motion abnormalities (hypokinesis of the anteroseptal wall,  unable to exclude conduction  abnrmality vs other etiology). Left ventricular diastolic parameters are  consistent with Grade I diastolic dysfunction (impaired relaxation).   2. Right ventricular systolic function is normal. The right ventricular  size is normal. Tricuspid regurgitation signal is inadequate for assessing  PA pressure.   3. The aortic valve was not well visualized, unable to determine number  of leaflets. Aortic valve regurgitation is mild. Mild aortic valve  sclerosis is present, with no evidence of aortic valve stenosis.   4. Aortic dilatation noted. There is moderate dilatation of the aortic  root, measuring 45 mm. There is mild dilatation of the ascending aorta,  measuring 39 mm.    Lexiscan  MPI 03/19/2019 Pharmacological myocardial perfusion imaging study with no significant  Ischemia Small fixed apical defect, unable to exclude attenuation artifact versus old scar Normal wall motion, EF estimated at 53% No EKG changes concerning for ischemia at peak stress or in recovery. Resting EKG with right bundle branch block CT attenuation corrected images with severe three-vessel coronary calcification, very mild aortic atherosclerosis History of known coronary artery disease, CABG Low risk scan   2D echo 03/12/2019 1. Left ventricular ejection fraction, by visual estimation, is 60 to  65%. The left ventricle has normal function. There is moderately increased  left ventricular hypertrophy.   2. Left ventricular diastolic parameters are consistent with Grade II  diastolic dysfunction (pseudonormalization).   3. Global right ventricle has normal systolic function.The right  ventricular size is normal. No increase in right ventricular wall  thickness.   4. Left  atrial size was normal.   5. The aortic valve was not well visualized. Unable to exclude bicuspid  aortic valve.   6. There is mild to moderate dilatation of the aortic root 44 mm and of  the ascending aorta measuring 40 mm. Aortic arch 2.7 cm   7. TR signal is inadequate for assessing pulmonary artery systolic  pressure.   Risk Assessment/Calculations   No BP recorded.  {Refresh Note OR Click here to enter BP  :1}***       Physical Exam VS:  There were no vitals taken for this visit.       Wt Readings from Last 3 Encounters:  03/21/24 148 lb (67.1 kg)  02/05/24 150 lb (68 kg)  01/20/24 149 lb (67.6 kg)    GEN: Well nourished, well developed in no acute distress NECK: No JVD; No carotid bruits CARDIAC: ***RRR, no murmurs, rubs, gallops RESPIRATORY:  Clear to auscultation without rales, wheezing or rhonchi  ABDOMEN: Soft, non-tender, non-distended EXTREMITIES:  No edema; No deformity   ASSESSMENT AND PLAN Coronary artery disease status post CABG times 08/16/2012 Primary hypertension Mixed hyperlipidemia Polysubstance abuse tobacco abuse alcohol abuse remote crack cocaine abuse Dilated aortic root     {Are you ordering a CV Procedure (e.g. stress test, cath, DCCV, TEE, etc)?   Press F2        :[id number]}  Dispo: ***  Signed, Rainey Kahrs, NP

## 2024-03-24 ENCOUNTER — Encounter: Payer: Self-pay | Admitting: Cardiology

## 2024-03-29 ENCOUNTER — Ambulatory Visit: Admitting: Cardiology

## 2024-04-14 ENCOUNTER — Other Ambulatory Visit: Payer: Self-pay

## 2024-04-14 ENCOUNTER — Ambulatory Visit

## 2024-04-14 ENCOUNTER — Encounter: Payer: Self-pay | Admitting: Emergency Medicine

## 2024-04-14 ENCOUNTER — Emergency Department
Admission: EM | Admit: 2024-04-14 | Discharge: 2024-04-14 | Disposition: A | Discharge status: Home / Self Care | Attending: Emergency Medicine | Admitting: Emergency Medicine

## 2024-04-14 ENCOUNTER — Emergency Department

## 2024-04-14 DIAGNOSIS — J441 Chronic obstructive pulmonary disease with (acute) exacerbation: Secondary | ICD-10-CM | POA: Insufficient documentation

## 2024-04-14 LAB — RESP PANEL BY RT-PCR (RSV, FLU A&B, COVID)  RVPGX2
Influenza A by PCR: NEGATIVE
Influenza B by PCR: NEGATIVE
Resp Syncytial Virus by PCR: NEGATIVE
SARS Coronavirus 2 by RT PCR: NEGATIVE

## 2024-04-14 LAB — BASIC METABOLIC PANEL WITH GFR
Anion gap: 13 (ref 5–15)
BUN: 13 mg/dL (ref 8–23)
CO2: 23 mmol/L (ref 22–32)
Calcium: 9.3 mg/dL (ref 8.9–10.3)
Chloride: 97 mmol/L — ABNORMAL LOW (ref 98–111)
Creatinine, Ser: 0.73 mg/dL (ref 0.61–1.24)
GFR, Estimated: >60 mL/min (ref >60)
Glucose, Bld: 229 mg/dL — ABNORMAL HIGH (ref 70–99)
Potassium: 4.6 mmol/L (ref 3.5–5.1)
Sodium: 133 mmol/L — ABNORMAL LOW (ref 135–145)

## 2024-04-14 LAB — CBC
HCT: 47.5 % (ref 39.0–52.0)
Hemoglobin: 15.1 g/dL (ref 13.0–17.0)
MCH: 27.7 pg (ref 26.0–34.0)
MCHC: 31.8 g/dL (ref 30.0–36.0)
MCV: 87.2 fL (ref 80.0–100.0)
Platelets: 126 K/uL — ABNORMAL LOW (ref 150–400)
RBC: 5.45 MIL/uL (ref 4.22–5.81)
RDW: 13 % (ref 11.5–15.5)
WBC: 8.4 K/uL (ref 4.0–10.5)
nRBC: 0 % (ref 0.0–0.2)

## 2024-04-14 MED ORDER — IPRATROPIUM-ALBUTEROL 0.5-2.5 (3) MG/3ML IN SOLN
3.0000 mL | Freq: Once | RESPIRATORY_TRACT | Status: AC
  Administered 2024-04-14: 19:00:00 3 mL via RESPIRATORY_TRACT
  Filled 2024-04-14: qty 3

## 2024-04-14 MED ORDER — PREDNISONE 20 MG PO TABS
60.0000 mg | ORAL_TABLET | Freq: Once | ORAL | Status: AC
  Administered 2024-04-14: 19:00:00 60 mg via ORAL
  Filled 2024-04-14: qty 3

## 2024-04-14 MED ORDER — PREDNISONE 10 MG (21) PO TBPK: ORAL_TABLET | ORAL | 0 refills | Status: AC

## 2024-04-14 NOTE — ED Provider Notes (Signed)
 [facility] Provider Note    Event Date/Time   First MD Initiated Contact with Patient 04/14/24 1712     (approximate)   History   Shortness of Breath   HPI  Kyle [name] is a 63 y.o. male with history of COPD who presents to the emergency department today because of concerns for shortness of breath, congested and a need for breathing treatment.  He says that usually about once a year at this time of year he will have increasing congestion and difficulty breathing.  He has been using his home nebulizer machine with without any significant relief.  He does feel like his sinuses are congested.  Denies any fevers.  Denies any known sick contacts.  Denies any chest pain.     Physical Exam   Triage Vital Signs: ED Triage Vitals  Encounter Vitals Group     BP 04/14/24 1441 (!) 188/108     Girls Systolic BP Percentile --      Girls Diastolic BP Percentile --      Boys Systolic BP Percentile --      Boys Diastolic BP Percentile --      Pulse Rate 04/14/24 1441 94     Resp 04/14/24 1441 18     Temp 04/14/24 1441 98.1 F (36.7 C)     Temp Source 04/14/24 1441 Oral     SpO2 04/14/24 1441 100 %     Weight 04/14/24 1440 148 lb (67.1 kg)     Height 04/14/24 1440 5' 7 (1.702 m)     Head Circumference --      Peak Flow --      Pain Score 04/14/24 1440 0     Pain Loc --      Pain Education --      Exclude from Growth Chart --     Most recent vital signs: Vitals:   04/14/24 1441  BP: (!) 188/108  Pulse: 94  Resp: 18  Temp: 98.1 F (36.7 C)  SpO2: 100%   General: Awake, alert, oriented. CV:  Good peripheral perfusion. Regular rate and rhythm. Resp:  Normal effort. Lungs clear. Abd:  No distention.   ED Results / Procedures / Treatments   Labs (all labs ordered are listed, but only abnormal results are displayed) Labs Reviewed  BASIC METABOLIC PANEL WITH GFR - Abnormal; Notable for the following components:      Result Value   Sodium 133  (*)    Chloride 97 (*)    Glucose, Bld 229 (*)    All other components within normal limits  CBC - Abnormal; Notable for the following components:   Platelets 126 (*)    All other components within normal limits     EKG  I, Guadalupe Eagles, attending physician, personally viewed and interpreted this EKG  EKG Time: 1446 Rate: 92 Rhythm: normal sinus rhythm Axis: left axis deviation Intervals: qtc 497 QRS: LAFB, RBBB ST changes: no st elevation Impression: abnormal ekg    RADIOLOGY I independently interpreted and visualized the CXR. My interpretation: No pneumonia Radiology interpretation:  IMPRESSION:  1. No acute cardiopulmonary process.    PROCEDURES:  Critical Care performed: No    MEDICATIONS ORDERED IN ED: Medications - No data to display   IMPRESSION / MDM / ASSESSMENT AND PLAN / ED COURSE  I reviewed the triage vital signs and the nursing notes.  Differential diagnosis includes, but is not limited to, COPD, pneumonia, viral illness  Patient's presentation is most consistent with acute presentation with potential threat to life or bodily function.   Patient presented to the emergency department today with concerns for shortness of breath and congestion.  On exam patient no acute distress.  Patient did feel better after breathing treatments here.  Chest x-ray without pneumonia.  At this time I do think viral illness and COPD exacerbation likely.  Will give patient prescription for steroids.  He states that he has enough nebulizer solution.     FINAL CLINICAL IMPRESSION(S) / ED DIAGNOSES   Final diagnoses:  COPD exacerbation (HCC)      Note:  This document was prepared using Dragon voice recognition software and may include unintentional dictation errors.    Floy Roberts, MD 04/14/24 2028

## 2024-04-14 NOTE — ED Triage Notes (Signed)
 Pt via POV from home. Pt c/o SOB and cough/congestion for the past 2 days. Reports hx of COPD. Denies any sick contact. Denies fever. Pt is A&Ox4 and NAD.

## 2024-05-13 ENCOUNTER — Ambulatory Visit: Payer: Medicare (Managed Care) | Attending: Cardiology

## 2024-06-02 ENCOUNTER — Ambulatory Visit: Payer: Medicare (Managed Care)

## 2024-06-07 ENCOUNTER — Ambulatory Visit: Payer: Medicare (Managed Care) | Admitting: Cardiology
